# Patient Record
Sex: Male | Born: 1935
Health system: Southern US, Community
[De-identification: ages and names within clinical notes are randomized; demographics above are authoritative.]

## PROBLEM LIST (undated history)

## (undated) DIAGNOSIS — G629 Polyneuropathy, unspecified: Secondary | ICD-10-CM

## (undated) DIAGNOSIS — I10 Essential (primary) hypertension: Secondary | ICD-10-CM

## (undated) DIAGNOSIS — C801 Malignant (primary) neoplasm, unspecified: Secondary | ICD-10-CM

## (undated) DIAGNOSIS — G709 Myoneural disorder, unspecified: Secondary | ICD-10-CM

## (undated) DIAGNOSIS — F329 Major depressive disorder, single episode, unspecified: Secondary | ICD-10-CM

## (undated) DIAGNOSIS — R413 Other amnesia: Secondary | ICD-10-CM

## (undated) DIAGNOSIS — R269 Unspecified abnormalities of gait and mobility: Secondary | ICD-10-CM

## (undated) DIAGNOSIS — F419 Anxiety disorder, unspecified: Secondary | ICD-10-CM

## (undated) DIAGNOSIS — C61 Malignant neoplasm of prostate: Secondary | ICD-10-CM

## (undated) DIAGNOSIS — H269 Unspecified cataract: Secondary | ICD-10-CM

## (undated) DIAGNOSIS — F32A Depression, unspecified: Secondary | ICD-10-CM

## (undated) DIAGNOSIS — G609 Hereditary and idiopathic neuropathy, unspecified: Secondary | ICD-10-CM

## (undated) DIAGNOSIS — R011 Cardiac murmur, unspecified: Secondary | ICD-10-CM

## (undated) HISTORY — PX: PROSTATE SURGERY: SHX751

## (undated) HISTORY — DX: Anxiety disorder, unspecified: F41.9

## (undated) HISTORY — DX: Other amnesia: R41.3

## (undated) HISTORY — DX: Unspecified abnormalities of gait and mobility: R26.9

## (undated) HISTORY — DX: Hereditary and idiopathic neuropathy, unspecified: G60.9

## (undated) HISTORY — DX: Unspecified cataract: H26.9

## (undated) HISTORY — DX: Major depressive disorder, single episode, unspecified: F32.9

## (undated) HISTORY — DX: Depression, unspecified: F32.A

## (undated) HISTORY — PX: CATARACT EXTRACTION: SUR2

## (undated) HISTORY — PX: EYE SURGERY: SHX253

## (undated) HISTORY — DX: Cardiac murmur, unspecified: R01.1

## (undated) HISTORY — DX: Myoneural disorder, unspecified: G70.9

---

## 1983-12-29 HISTORY — PX: HERNIA REPAIR: SHX51

## 1998-03-28 ENCOUNTER — Encounter: Admission: RE | Admit: 1998-03-28 | Discharge: 1998-06-26 | Payer: Self-pay | Admitting: Radiation Oncology

## 1999-09-08 ENCOUNTER — Emergency Department (HOSPITAL_COMMUNITY): Admission: EM | Admit: 1999-09-08 | Discharge: 1999-09-08 | Payer: Self-pay | Admitting: Emergency Medicine

## 2002-01-24 ENCOUNTER — Encounter: Payer: Self-pay | Admitting: Urology

## 2002-01-27 ENCOUNTER — Ambulatory Visit (HOSPITAL_COMMUNITY): Admission: RE | Admit: 2002-01-27 | Discharge: 2002-01-27 | Payer: Self-pay | Admitting: Urology

## 2002-02-04 ENCOUNTER — Ambulatory Visit (HOSPITAL_COMMUNITY): Admission: RE | Admit: 2002-02-04 | Discharge: 2002-02-04 | Payer: Self-pay | Admitting: Internal Medicine

## 2002-02-04 ENCOUNTER — Encounter: Payer: Self-pay | Admitting: Internal Medicine

## 2002-10-31 ENCOUNTER — Encounter: Admission: RE | Admit: 2002-10-31 | Discharge: 2002-10-31 | Payer: Self-pay | Admitting: Urology

## 2002-10-31 ENCOUNTER — Encounter: Payer: Self-pay | Admitting: Urology

## 2005-09-26 ENCOUNTER — Encounter: Admission: RE | Admit: 2005-09-26 | Discharge: 2005-09-26 | Payer: Self-pay | Admitting: Emergency Medicine

## 2006-07-11 ENCOUNTER — Emergency Department (HOSPITAL_COMMUNITY): Admission: EM | Admit: 2006-07-11 | Discharge: 2006-07-11 | Payer: Self-pay | Admitting: Emergency Medicine

## 2006-07-16 ENCOUNTER — Emergency Department (HOSPITAL_COMMUNITY): Admission: EM | Admit: 2006-07-16 | Discharge: 2006-07-16 | Payer: Self-pay | Admitting: Family Medicine

## 2008-05-29 ENCOUNTER — Ambulatory Visit (HOSPITAL_COMMUNITY): Admission: RE | Admit: 2008-05-29 | Discharge: 2008-05-29 | Payer: Self-pay | Admitting: Urology

## 2009-01-17 ENCOUNTER — Encounter: Admission: RE | Admit: 2009-01-17 | Discharge: 2009-01-17 | Payer: Self-pay | Admitting: Emergency Medicine

## 2011-05-15 NOTE — Op Note (Signed)
[facility]  Patient:    BRASEN, BUNDREN Visit Number: [id number] MRN: [id number]          Service Type: DSU Location: DAY Attending Physician:  Ellwood Handler Dictated by:   Verl Dicker, M.D. Proc. Date: 01/27/02 Admit Date:  01/27/2002   CC:         Reuben Likes, M.D.  Wynn Banker, M.D.   Operative Report  DATE OF BIRTH:  [birth year]  PREOPERATIVE DIAGNOSIS:  Urethral stricture after seed implant March of 1999, unable to dilate stricture in the office.  Patient brought to the operating room.  POSTOPERATIVE DIAGNOSIS:  Urethral stricture after seed implant March of 1999, unable to dilate stricture in the office.  Patient brought to the operating room.  OPERATION PERFORMED:  DVIU.  SURGEON:  Verl Dicker, M.D.  RADIATION ONCOLOGIST:  Wynn Banker, M.D.  LMD:  Reuben Likes, M.D.  ANESTHESIA:  General.  DRAINS:  20 French [place] tip catheter.  DESCRIPTION OF PROCEDURE:  The patient was prepped and draped in the dorsal lithotomy position after institution of an adequate level of general anesthesia.  A well-lubricated 17 French panendoscope was gently inserted at the urethral meatus.  Normal penile and bulbous urethra, tight stricture noted in the proximal bulbous urethra.  Guide wire was passed through the center of the strictured area, gently incised at the 12 oclock DVIU knife allowed easily passage of the 20 French urethrotome into the bladder.  There was no evidence of bladder neck contracture.  The prostate had shrunken considerably after seed implant in March 1999.  Bladder showed a large capacity with trabeculation.  No obvious abnormalities within the bladder.  Guide wire was left in place.  Ureteral dilators were used to additionally dilate the strictured area.  Guide wire was then left in place and a 20 [place] Council tip catheter was passed over the guide wire with immediate  return of several hundred ccs of clear urine.  Council tip catheter was left to straight drain and patient was returned to recovery in satisfactory condition. Dictated by:   Verl Dicker, M.D. Attending Physician:  Ellwood Handler DD:  01/27/02 TD:  01/27/02 Job: [id number]

## 2012-02-11 DIAGNOSIS — C61 Malignant neoplasm of prostate: Secondary | ICD-10-CM | POA: Diagnosis not present

## 2012-02-11 DIAGNOSIS — R82998 Other abnormal findings in urine: Secondary | ICD-10-CM | POA: Diagnosis not present

## 2012-02-29 DIAGNOSIS — Z961 Presence of intraocular lens: Secondary | ICD-10-CM | POA: Diagnosis not present

## 2012-04-06 DIAGNOSIS — C61 Malignant neoplasm of prostate: Secondary | ICD-10-CM | POA: Diagnosis not present

## 2012-04-13 DIAGNOSIS — N390 Urinary tract infection, site not specified: Secondary | ICD-10-CM | POA: Diagnosis not present

## 2012-04-13 DIAGNOSIS — C61 Malignant neoplasm of prostate: Secondary | ICD-10-CM | POA: Diagnosis not present

## 2012-04-13 DIAGNOSIS — R350 Frequency of micturition: Secondary | ICD-10-CM | POA: Diagnosis not present

## 2012-04-13 DIAGNOSIS — N529 Male erectile dysfunction, unspecified: Secondary | ICD-10-CM | POA: Diagnosis not present

## 2012-04-28 DIAGNOSIS — R350 Frequency of micturition: Secondary | ICD-10-CM | POA: Diagnosis not present

## 2012-04-28 DIAGNOSIS — C61 Malignant neoplasm of prostate: Secondary | ICD-10-CM | POA: Diagnosis not present

## 2012-05-10 DIAGNOSIS — D126 Benign neoplasm of colon, unspecified: Secondary | ICD-10-CM | POA: Diagnosis not present

## 2012-05-10 DIAGNOSIS — K5730 Diverticulosis of large intestine without perforation or abscess without bleeding: Secondary | ICD-10-CM | POA: Diagnosis not present

## 2012-05-10 DIAGNOSIS — K648 Other hemorrhoids: Secondary | ICD-10-CM | POA: Diagnosis not present

## 2012-05-10 DIAGNOSIS — Z86010 Personal history of colonic polyps: Secondary | ICD-10-CM | POA: Diagnosis not present

## 2012-05-10 DIAGNOSIS — Z09 Encounter for follow-up examination after completed treatment for conditions other than malignant neoplasm: Secondary | ICD-10-CM | POA: Diagnosis not present

## 2012-05-19 DIAGNOSIS — N281 Cyst of kidney, acquired: Secondary | ICD-10-CM | POA: Diagnosis not present

## 2012-05-19 DIAGNOSIS — C61 Malignant neoplasm of prostate: Secondary | ICD-10-CM | POA: Diagnosis not present

## 2012-05-19 DIAGNOSIS — K7689 Other specified diseases of liver: Secondary | ICD-10-CM | POA: Diagnosis not present

## 2012-05-19 DIAGNOSIS — R310 Gross hematuria: Secondary | ICD-10-CM | POA: Diagnosis not present

## 2012-05-19 DIAGNOSIS — N3289 Other specified disorders of bladder: Secondary | ICD-10-CM | POA: Diagnosis not present

## 2012-05-24 DIAGNOSIS — R82998 Other abnormal findings in urine: Secondary | ICD-10-CM | POA: Diagnosis not present

## 2012-05-24 DIAGNOSIS — C61 Malignant neoplasm of prostate: Secondary | ICD-10-CM | POA: Diagnosis not present

## 2012-05-24 DIAGNOSIS — R310 Gross hematuria: Secondary | ICD-10-CM | POA: Diagnosis not present

## 2012-05-29 ENCOUNTER — Emergency Department (HOSPITAL_COMMUNITY)
Admission: EM | Admit: 2012-05-29 | Discharge: 2012-05-29 | Disposition: A | Discharge status: Home / Self Care | Payer: Medicare Other | Attending: Emergency Medicine | Admitting: Emergency Medicine

## 2012-05-29 ENCOUNTER — Encounter (HOSPITAL_COMMUNITY): Payer: Self-pay | Admitting: Emergency Medicine

## 2012-05-29 DIAGNOSIS — I10 Essential (primary) hypertension: Secondary | ICD-10-CM | POA: Insufficient documentation

## 2012-05-29 DIAGNOSIS — R319 Hematuria, unspecified: Secondary | ICD-10-CM | POA: Diagnosis not present

## 2012-05-29 DIAGNOSIS — R339 Retention of urine, unspecified: Secondary | ICD-10-CM

## 2012-05-29 DIAGNOSIS — Z8546 Personal history of malignant neoplasm of prostate: Secondary | ICD-10-CM | POA: Insufficient documentation

## 2012-05-29 HISTORY — DX: Malignant neoplasm of prostate: C61

## 2012-05-29 HISTORY — DX: Malignant (primary) neoplasm, unspecified: C80.1

## 2012-05-29 HISTORY — DX: Essential (primary) hypertension: I10

## 2012-05-29 HISTORY — DX: Polyneuropathy, unspecified: G62.9

## 2012-05-29 LAB — POCT I-STAT, CHEM 8
BUN: 19 mg/dL (ref 6–23)
Chloride: 104 mEq/L (ref 96–112)
Glucose, Bld: 97 mg/dL (ref 70–99)
HCT: 39 % (ref 39.0–52.0)
Potassium: 3.6 mEq/L (ref 3.5–5.1)

## 2012-05-29 LAB — URINALYSIS, ROUTINE W REFLEX MICROSCOPIC
Glucose, UA: NEGATIVE mg/dL
Protein, ur: 100 mg/dL — AB
Specific Gravity, Urine: 1.016 (ref 1.005–1.030)
pH: 6.5 (ref 5.0–8.0)

## 2012-05-29 LAB — URINE MICROSCOPIC-ADD ON

## 2012-05-29 MED ORDER — CEPHALEXIN 500 MG PO CAPS: 500.0000 mg | ORAL_CAPSULE | Freq: Three times a day (TID) | ORAL | Status: AC

## 2012-05-29 NOTE — ED Notes (Signed)
Gave patient a sandwich, drink, and some crackers and peanut.

## 2012-05-29 NOTE — ED Notes (Signed)
Urinary discomfort relieved with foley-foley draining blood tinged urine

## 2012-05-29 NOTE — ED Provider Notes (Signed)
History     CSN: [id number]  Arrival date & time 05/29/12  1227   First MD Initiated Contact with Patient 05/29/12 1301      Chief Complaint  Patient presents with  . Dysuria    (Consider location/radiation/quality/duration/timing/severity/associated sxs/prior treatment) HPI Comments: Patient with history of prostate cancer presents with urinary retention since last night at approximately 10 PM. Patient has had some dribbling since. He also reports hematuria for the past day. Patient denies blood thinning medications. He has not had any dysuria. He denies fever, nausea, vomiting. He has suprapubic abdominal discomfort associated with urinary retention. No other medical complaints voiced. Onset was gradual. Course was constant. The patient abdomen makes the symptoms worse. Nothing makes symptoms better.  The history is provided by the patient.    Past Medical History  Diagnosis Date  . Cancer   . Hypertension   . Prostate cancer   . Neuropathy     Past Surgical History  Procedure Date  . Hernia repair   . Prostate surgery     No family history on file.  History  Substance Use Topics  . Smoking status: Former Games developer  . Smokeless tobacco: Not on file  . Alcohol Use: No      Review of Systems  Constitutional: Negative for fever.  HENT: Negative for sore throat and rhinorrhea.   Eyes: Negative for redness.  Respiratory: Negative for cough.   Cardiovascular: Negative for chest pain.  Gastrointestinal: Positive for abdominal pain. Negative for nausea, vomiting and diarrhea.  Genitourinary: Positive for hematuria, decreased urine volume and difficulty urinating. Negative for dysuria, flank pain, discharge and penile pain.  Musculoskeletal: Negative for myalgias.  Skin: Negative for rash.  Neurological: Negative for headaches.    Allergies  Review of patient's allergies indicates no known allergies.  Home Medications  No current outpatient prescriptions on  file.  BP 158/88  Pulse 68  Temp(Src) 98.3 F (36.8 C) (Oral)  Resp 16  SpO2 97%  Physical Exam  Nursing note and vitals reviewed. Constitutional: He appears well-developed and well-nourished.  HENT:  Head: Normocephalic and atraumatic.  Eyes: Conjunctivae are normal. Right eye exhibits no discharge. Left eye exhibits no discharge.  Neck: Normal range of motion. Neck supple.  Cardiovascular: Normal rate, regular rhythm and normal heart sounds.   Pulmonary/Chest: Effort normal and breath sounds normal.  Abdominal: Soft. There is tenderness in the suprapubic area. There is no rigidity, no rebound, no guarding, no tenderness at McBurney's point and negative Murphy's sign.  Genitourinary: Testes normal. Uncircumcised. No penile erythema or penile tenderness. Discharge (dried blood noted at urethral meatus) found.  Neurological: He is alert.  Skin: Skin is warm and dry.  Psychiatric: He has a normal mood and affect.    ED Course  Procedures (including critical care time)  Labs Reviewed  URINALYSIS, ROUTINE W REFLEX MICROSCOPIC - Abnormal; Notable for the following:    Color, Urine RED (*) BIOCHEMICALS MAY BE AFFECTED BY COLOR   APPearance CLOUDY (*)    Hgb urine dipstick LARGE (*)    Ketones, ur TRACE (*)    Protein, ur 100 (*)    Leukocytes, UA MODERATE (*)    All other components within normal limits  URINE MICROSCOPIC-ADD ON - Abnormal; Notable for the following:    Bacteria, UA MANY (*)    All other components within normal limits  POCT I-STAT, CHEM 8  URINE CULTURE   No results found.   1. Urinary retention  1:38 PM Patient seen and examined. Work-up initiated. Order for foley cath.   Vital signs reviewed and are as follows: Filed Vitals:   05/29/12 1247  BP: 158/88  Pulse: 68  Temp: 98.3 F (36.8 C)  Resp: 16   With the patient is much improved after Foley catheter placement. I-STAT demonstrates a normal hemoglobin and renal function. Patient seen and  examined with Dr. Radford Pax. Will leave Foley catheter in place and have patient followup with his urologist this week. Will add Keflex to treat any possible urinary tract infection. Urine culture was sent. Patient appears well at time of discharge. The patient is comfortable with the plan. He was given strict return instructions including worsening pain, severe amount of bleeding, lightheadedness, or if he has any other concerns.    MDM  Urinary retention, Foley placed. Patient improved after Foley placement. I-STAT is unconcerning. Patient has appropriate urology followup and will see his urologist this coming week. Patient appears well and is stable at time of discharge.        Renne Crigler, [place] 05/29/12 [phone]

## 2012-05-29 NOTE — ED Notes (Signed)
Took catheter bag and put leg bag on.

## 2012-05-29 NOTE — ED Notes (Signed)
Pt presenting to ed with c/o dysuria. Pt states he lasted urinated last night and has just been dripping urine this am. Pt states he has urgency. Pt states history of prostate cancer and he has had to self-cath in the past. Pt is alert and oriented at this time.

## 2012-05-29 NOTE — ED Notes (Signed)
Family at bedside. 

## 2012-05-29 NOTE — Discharge Instructions (Signed)
Please read and follow all provided instructions.  Your diagnoses today include:  1. Urinary retention     Tests performed today include:  Urine test  Blood test that showed normal blood count and kidney function  Vital signs. See below for your results today.   Medications prescribed:   Keflex - antibiotic for urinary tract infection  You have been prescribed an antibiotic medicine: take the entire course of medicine even if you are feeling better. Stopping early can cause the antibiotic not to work.  Take any prescribed medications only as directed.  Home care instructions:  Follow any educational materials contained in this packet.  Follow-up instructions: Please follow-up with your urologist in the next 3 days for further evaluation of your symptoms. If you do not have a primary care doctor -- see below for referral information.   Return instructions:   Please return to the Emergency Department if you experience worsening symptoms.   Please return if you have any other emergent concerns.  Additional Information:  Your vital signs today were: BP 126/67  Pulse 61  Temp(Src) 98.4 F (36.9 C) (Oral)  Resp 16  SpO2 98% If your blood pressure (BP) was elevated above 135/85 this visit, please have this repeated by your doctor within one month. -------------- No Primary Care Doctor Call [facility]  [phone] Other agencies that provide inexpensive medical care    [facility]  [phone]    [facility]  [phone]    [facility]  [phone]    [facility]  [phone]    [facility]  [phone]    [facility]  [phone] -------------- RESOURCE GUIDE:  Dental Problems  Patients with Medicaid: [facility] [phone] [address]                                            [id number] Phone:  [phone]                                                    Phone:  [phone]  If unable to pay or uninsured, contact:  [facility] or [facility]. to become qualified for the adult dental clinic.  Chronic Pain Problems Contact [facility]  [phone] Patients need to be referred by their primary care doctor.  Insufficient Money for Medicine Contact [employer]:  call "211" or [facility] [phone].  Psychological Services [facility]  [phone] [facility]  [phone] [facility]   [phone] (emergency services [phone])  Substance Abuse Resources [facility]  [phone] [facility] 6787561147 [facility] [phone] Floydene Flock 7261947115 Residential & Outpatient Substance Abuse Program  [phone]  Abuse/Neglect [facility] Child Abuse Hotline (443) 652-4311 [facility] Child Abuse Hotline [phone] (After Hours)  Emergency Shelter [facility] [phone]  Maternity Homes [facility] [phone] [facility] [phone]  [facility]  [facility] [address] [place]                       [address]      [address]  [place]                                                [place]                            [place] Phone:  [phone]                                   Phone:  [phone]                 Phone:  [phone]  [facility] Phone:  [phone]  [place] Child Abuse Hotline (336) 342-1394 [phone]) [phone] (After Hours)    

## 2012-05-30 ENCOUNTER — Encounter (HOSPITAL_COMMUNITY): Payer: Self-pay | Admitting: Emergency Medicine

## 2012-05-30 ENCOUNTER — Emergency Department (HOSPITAL_COMMUNITY)
Admission: EM | Admit: 2012-05-30 | Discharge: 2012-05-30 | Disposition: A | Discharge status: Home / Self Care | Payer: Medicare Other | Attending: Emergency Medicine | Admitting: Emergency Medicine

## 2012-05-30 DIAGNOSIS — N390 Urinary tract infection, site not specified: Secondary | ICD-10-CM | POA: Insufficient documentation

## 2012-05-30 DIAGNOSIS — R338 Other retention of urine: Secondary | ICD-10-CM | POA: Insufficient documentation

## 2012-05-30 DIAGNOSIS — T83091A Other mechanical complication of indwelling urethral catheter, initial encounter: Secondary | ICD-10-CM | POA: Diagnosis not present

## 2012-05-30 DIAGNOSIS — R319 Hematuria, unspecified: Secondary | ICD-10-CM | POA: Diagnosis not present

## 2012-05-30 DIAGNOSIS — R339 Retention of urine, unspecified: Secondary | ICD-10-CM

## 2012-05-30 DIAGNOSIS — Z8546 Personal history of malignant neoplasm of prostate: Secondary | ICD-10-CM | POA: Insufficient documentation

## 2012-05-30 DIAGNOSIS — Y846 Urinary catheterization as the cause of abnormal reaction of the patient, or of later complication, without mention of misadventure at the time of the procedure: Secondary | ICD-10-CM | POA: Insufficient documentation

## 2012-05-30 NOTE — Discharge Instructions (Signed)
Complete the course of your antibiotic.  Your urine culture is not yet resulted - have your doctor follow up on that result tomorrow. Follow up with urologist tomorrow - call today to arrange appointment time. Return to ER if worse, catheter not draining, abdominal pain, fevers, other concern.      Foley Catheter Care, Adult A soft, flexible tube (Foley catheter) has been placed in your bladder. This may be done to temporarily help with urine drainage after an operation or to relieve blockage from an enlarged prostate gland. HOME CARE INSTRUCTIONS  If you are going home with a Foley catheter in place, follow these instructions: Taking Care of the Catheter:  Keep the area where the catheter leaves your body clean.   Attach the catheter to the leg so there is no tension on the catheter.   Keep the drainage bag below the level of the bladder, but keep it OFF the floor.   Do not take long soaking baths. Your caregiver will give instructions about showering.   Wash your hands before touching ANYTHING related to the catheter or bag.   Using mild soap and warm water on a washcloth:   Clean the area closest to the catheter insertion site using a circular motion around the catheter.   Clean the catheter itself by wiping AWAY from the insertion site for several inches down the tube.   NEVER wipe upward as this could sweep bacteria up into the urethra (tube in your body that normally drains the bladder) and cause infection.  Taking Care of the Drainage Bags:  Two drainage bags will be taken home: a large overnight drainage bag, and a smaller leg bag which fits underneath clothing.   It is okay to wear the overnight bag at any time, but NEVER wear the smaller leg bag at night.   Keep the drainage bag well below the level of your bladder. This prevents backflow of urine into the bladder and allows the urine to drain freely.   Anchor the tubing to your leg to prevent pulling or tension on  the catheter. Use tape or a leg strap provided by the hospital.   Empty the drainage bag when it is  to  full. Wash your hands before and after touching the bag.   Periodically check the tubing for kinks to make sure there is no pressure on the tubing which could restrict the flow of urine.  Changing the Drainage Bags:  Cleanse both ends of the clean bag with alcohol before changing.   Pinch off the rubber catheter to avoid urine spillage during the disconnection.   Disconnect the dirty bag and connect the clean one.   Empty the dirty bag carefully to avoid a urine spill.   Attach the new bag to the leg with tape or a leg strap.  Cleaning the Drainage Bags:  Whenever a drainage bag is disconnected, it must be cleaned quickly so it is ready for the next use.   Wash the bag in warm, soapy water.   Rinse the bag thoroughly with warm water.   Soak the bag for 30 minutes in a solution of white vinegar and water (1 cup vinegar to 1 quart warm water).   Rinse with warm water.  SEEK MEDICAL CARE IF:   Some pain develops in the kidney (lower back) area.   The urine is cloudy or smells bad.   There is some blood in the urine.   The catheter becomes clogged and/or there  is no urine drainage.  SEEK IMMEDIATE MEDICAL CARE IF:   You have moderate or severe pain in the kidney region.   You start to throw up (vomit).   Blood fills the tube.   Worsening belly (abdominal) pain develops.   You have a fever.  MAKE SURE YOU:   Understand these instructions.   Will watch your condition.   Will get help right away if you are not doing well or get worse.  Document Released: 12/14/2005 Document Revised: 12/03/2011 Document Reviewed: 06/10/2007 [facility] Patient Information 2012 [place], [place].     Hematuria, Adult Hematuria (blood in your urine) can be caused by a bladder infection (cystitis), kidney infection (pyelonephritis), prostate infection (prostatitis), or kidney stone.  Infections will usually respond to antibiotics (medications which kill germs), and a kidney stone will usually pass through your urine without further treatment. If you were put on antibiotics, take all the medicine until gone. You may feel better in a few days, but take all of your medicine or the infection may not respond and become more difficult to treat. If antibiotics were not given, an infection did not cause the blood in the urine. A further work up to find out the reason may be needed. HOME CARE INSTRUCTIONS   Drink lots of fluid, 3 to 4 quarts a day. If you have been diagnosed with an infection, cranberry juice is especially recommended, in addition to large amounts of water.   Avoid caffeine, tea, and carbonated beverages, because they tend to irritate the bladder.   Avoid alcohol as it may irritate the prostate.   Only take over-the-counter or prescription medicines for pain, discomfort, or fever as directed by your caregiver.   If you have been diagnosed with a kidney stone follow your caregivers instructions regarding straining your urine to catch the stone.  TO PREVENT FURTHER INFECTIONS:  Empty the bladder often. Avoid holding urine for long periods of time.   After a bowel movement, women should cleanse front to back. Use each tissue only once.   Empty the bladder before and after sexual intercourse if you are a male.   Return to your caregiver if you develop back pain, fever, nausea (feeling sick to your stomach), vomiting, or your symptoms (problems) are not better in 3 days. Return sooner if you are getting worse.  If you have been requested to return for further testing make sure to keep your appointments. If an infection is not the cause of blood in your urine, X-rays may be required. Your caregiver will discuss this with you. SEEK IMMEDIATE MEDICAL CARE IF:   You have a persistent fever over 102 F (38.9 C).   You develop severe vomiting and are unable to keep the  medication down.   You develop severe back or abdominal pain despite taking your medications.   You begin passing a large amount of blood or clots in your urine.   You feel extremely weak or faint, or pass out.  MAKE SURE YOU:   Understand these instructions.   Will watch your condition.   Will get help right away if you are not doing well or get worse.  Document Released: 12/14/2005 Document Revised: 12/03/2011 Document Reviewed: 08/02/2008 [facility] Patient Information 2012 [place], [place].      Acute Urinary Retention, Male You have been seen by a caregiver today because of your inability to urinate (pass your water). This is a common problem in elderly males. As men age their prostates become larger and  block the flow of urine from the bladder. This is usually a problem that has come on gradually. It is often first noticed by having to get up at night to urinate. This is because as the prostate enlarges it is more difficult to empty the bladder completely. Treatment may involve a one time catheterization to empty the bladder. This is putting in a tube to drain your urine. Then you and your personal caregiver can decide at your earliest convenience how to handle this problem in the future. It may also be a problem that may not recur for years. Sometimes this problem can be caused by medications. In this case, all that is often necessary is to discontinue the offending agent. If you are to leave the foley catheter (a long, narrow, hollow tube) in and go home with a drainage system, you will need to discuss the best course of action with your caregiver. While the catheter is in, maintain a good intake of fluids. Keep the drainage bag emptied and lower than your catheter. This is so contaminated (infected) urine will not be flowing back into your bladder. This could lead to a urinary tract infection. Only take over-the-counter or prescription medicines for pain, discomfort, or fever as  directed by your caregiver.  SEEK IMMEDIATE MEDICAL CARE IF:  You develop chills, fever, or show signs of generalized illness that occurs prior to seeing your caregiver. Document Released: 03/22/2001 Document Revised: 12/03/2011 Document Reviewed: 12/05/2008 [facility] Patient Information 2012 [place], [place].

## 2012-05-30 NOTE — ED Notes (Signed)
Patient belongings by the bedside.

## 2012-05-30 NOTE — ED Provider Notes (Signed)
History     CSN: [id number]  Arrival date & time 05/30/12  4098   First MD Initiated Contact with Patient 05/30/12 [phone]     Chief complaint: foley catheter not working   (Consider location/radiation/quality/duration/timing/severity/associated sxs/prior treatment) The history is provided by the patient.  pt had foley catheter placed yesterday due to urine retention/uti/hematuria. This am foley clogged, urine draining around catheter. No abd pain. No nv. No fever or chills. In on abx, urine culture not yet resulted. No coumadin/anticoag use.      Past Medical History  Diagnosis Date  . Cancer   . Hypertension   . Prostate cancer   . Neuropathy     Past Surgical History  Procedure Date  . Hernia repair   . Prostate surgery     No family history on file.  History  Substance Use Topics  . Smoking status: Former Games developer  . Smokeless tobacco: Not on file  . Alcohol Use: No      Review of Systems  Constitutional: Negative for fever and chills.  Respiratory: Negative for shortness of breath.   Gastrointestinal: Negative for vomiting and abdominal pain.  Neurological: Negative for light-headedness.    Allergies  Review of patient's allergies indicates no known allergies.  Home Medications   Current Outpatient Rx  Name Route Sig Dispense Refill  . ASPIRIN EC 81 MG PO TBEC Oral Take 81 mg by mouth daily.    Marland Kitchen BICALUTAMIDE 50 MG PO TABS Oral Take 50 mg by mouth daily.    Marland Kitchen CALCIUM + D PO Oral Take 1 tablet by mouth 2 (two) times daily.    . CEPHALEXIN 500 MG PO CAPS Oral Take 1 capsule (500 mg total) by mouth 3 (three) times daily. 21 capsule 0  . CIPROFLOXACIN HCL 250 MG PO TABS Oral Take 250 mg by mouth 2 (two) times daily.    . OMEGA-3 FATTY ACIDS 1000 MG PO CAPS Oral Take 1 g by mouth 2 (two) times daily.    Marland Kitchen GABAPENTIN 300 MG PO CAPS Oral Take 300 mg by mouth 4 (four) times daily.    Marland Kitchen HYDROCHLOROTHIAZIDE 25 MG PO TABS Oral Take 25 mg by mouth daily.    Marland Kitchen  SIMVASTATIN 40 MG PO TABS Oral Take 40 mg by mouth at bedtime.      BP 106/76  Pulse 82  Temp(Src) 98.3 F (36.8 C) (Oral)  Resp 14  SpO2 97%  Physical Exam  Nursing note and vitals reviewed. Constitutional: He is oriented to person, place, and time. He appears well-developed and well-nourished. No distress.  HENT:  Head: Atraumatic.  Eyes: Pupils are equal, round, and reactive to light.  Neck: Neck supple. No tracheal deviation present.  Cardiovascular: Normal rate.   Pulmonary/Chest: Effort normal. No accessory muscle usage. No respiratory distress.  Abdominal: Soft. Bowel sounds are normal. He exhibits no distension. There is no tenderness.  Genitourinary:       Normal ext genitalia. Foley in place. Small amt blood tinged urine in bag.   Musculoskeletal: Normal range of motion.  Neurological: He is alert and oriented to person, place, and time.  Skin: Skin is warm and dry.  Psychiatric: He has a normal mood and affect.    ED Course  Procedures (including critical care time)     MDM   Foley irrigated. Pt states sees Dr Mena Goes - will have follow up there.    Pt on abx, u cx not yet back. After irrigation catheter draining well  into bag. abd soft nt. No pain.     Suzi Roots, MD 05/30/12 1034

## 2012-05-30 NOTE — ED Notes (Signed)
Pt co of blood clots and urinating around the foley that was placed yesterday here, for urinary retention.

## 2012-05-31 DIAGNOSIS — C61 Malignant neoplasm of prostate: Secondary | ICD-10-CM | POA: Diagnosis not present

## 2012-05-31 DIAGNOSIS — R310 Gross hematuria: Secondary | ICD-10-CM | POA: Diagnosis not present

## 2012-05-31 LAB — URINE CULTURE
Colony Count: NO GROWTH
Culture  Setup Time: 201306021927
Culture: NO GROWTH
Special Requests: NORMAL

## 2012-05-31 NOTE — ED Provider Notes (Signed)
Medical screening examination/treatment/procedure(s) were performed by non-physician practitioner and as supervising physician I was immediately available for consultation/collaboration.    Jaben Benegas L Dezirae Service, MD 05/31/12 0822 

## 2012-06-14 DIAGNOSIS — G609 Hereditary and idiopathic neuropathy, unspecified: Secondary | ICD-10-CM | POA: Diagnosis not present

## 2012-06-14 DIAGNOSIS — C61 Malignant neoplasm of prostate: Secondary | ICD-10-CM | POA: Diagnosis not present

## 2012-06-14 DIAGNOSIS — I10 Essential (primary) hypertension: Secondary | ICD-10-CM | POA: Diagnosis not present

## 2012-06-14 DIAGNOSIS — E785 Hyperlipidemia, unspecified: Secondary | ICD-10-CM | POA: Diagnosis not present

## 2012-06-29 DIAGNOSIS — C61 Malignant neoplasm of prostate: Secondary | ICD-10-CM | POA: Diagnosis not present

## 2012-08-16 DIAGNOSIS — M5137 Other intervertebral disc degeneration, lumbosacral region: Secondary | ICD-10-CM | POA: Diagnosis not present

## 2012-08-16 DIAGNOSIS — M25559 Pain in unspecified hip: Secondary | ICD-10-CM | POA: Diagnosis not present

## 2012-08-16 DIAGNOSIS — M999 Biomechanical lesion, unspecified: Secondary | ICD-10-CM | POA: Diagnosis not present

## 2012-10-11 DIAGNOSIS — C61 Malignant neoplasm of prostate: Secondary | ICD-10-CM | POA: Diagnosis not present

## 2012-10-25 DIAGNOSIS — C61 Malignant neoplasm of prostate: Secondary | ICD-10-CM | POA: Diagnosis not present

## 2012-11-03 DIAGNOSIS — Z23 Encounter for immunization: Secondary | ICD-10-CM | POA: Diagnosis not present

## 2012-12-12 DIAGNOSIS — G609 Hereditary and idiopathic neuropathy, unspecified: Secondary | ICD-10-CM | POA: Diagnosis not present

## 2012-12-12 DIAGNOSIS — E785 Hyperlipidemia, unspecified: Secondary | ICD-10-CM | POA: Diagnosis not present

## 2012-12-12 DIAGNOSIS — I10 Essential (primary) hypertension: Secondary | ICD-10-CM | POA: Diagnosis not present

## 2012-12-12 DIAGNOSIS — Z79899 Other long term (current) drug therapy: Secondary | ICD-10-CM | POA: Diagnosis not present

## 2013-04-27 DIAGNOSIS — C61 Malignant neoplasm of prostate: Secondary | ICD-10-CM | POA: Diagnosis not present

## 2013-04-30 ENCOUNTER — Encounter (HOSPITAL_COMMUNITY): Payer: Self-pay | Admitting: *Deleted

## 2013-04-30 ENCOUNTER — Emergency Department (HOSPITAL_COMMUNITY)
Admission: EM | Admit: 2013-04-30 | Discharge: 2013-04-30 | Disposition: A | Discharge status: Home / Self Care | Payer: Medicare Other | Attending: Emergency Medicine | Admitting: Emergency Medicine

## 2013-04-30 DIAGNOSIS — Z8546 Personal history of malignant neoplasm of prostate: Secondary | ICD-10-CM | POA: Diagnosis not present

## 2013-04-30 DIAGNOSIS — Z87891 Personal history of nicotine dependence: Secondary | ICD-10-CM | POA: Diagnosis not present

## 2013-04-30 DIAGNOSIS — Z8589 Personal history of malignant neoplasm of other organs and systems: Secondary | ICD-10-CM | POA: Diagnosis not present

## 2013-04-30 DIAGNOSIS — G609 Hereditary and idiopathic neuropathy, unspecified: Secondary | ICD-10-CM | POA: Diagnosis not present

## 2013-04-30 DIAGNOSIS — R209 Unspecified disturbances of skin sensation: Secondary | ICD-10-CM | POA: Diagnosis not present

## 2013-04-30 DIAGNOSIS — Z79899 Other long term (current) drug therapy: Secondary | ICD-10-CM | POA: Diagnosis not present

## 2013-04-30 DIAGNOSIS — I10 Essential (primary) hypertension: Secondary | ICD-10-CM | POA: Diagnosis not present

## 2013-04-30 DIAGNOSIS — G629 Polyneuropathy, unspecified: Secondary | ICD-10-CM

## 2013-04-30 NOTE — ED Provider Notes (Signed)
History     CSN: [id number]  Arrival date & time 04/30/13  0300   First MD Initiated Contact with Patient 04/30/13 [id number]      Chief Complaint  Patient presents with  . Leg Pain    (Consider location/radiation/quality/duration/timing/severity/associated sxs/prior treatment) Patient is a 79 y.o. male presenting with leg pain.  Leg Pain  79 yo male presents to the ER with the complaint of left leg numbness.  Pt reports h/o peripheral neuropathy without known inciting cause, ongoing for years.  Pt has had workup through neurology and specialist for possible lupus, all neg aside from +EMG studies.  He was seen a few years ago when he had acute worsening of numbness in his left lower leg, reports [facility] referred him to a vascular surgeon who told him everything looked ok from his standpoint, but to take a baby aspirin a day.  He reports after some time of taking the 81 mg aspirin, symptoms resolved.    Past Medical History  Diagnosis Date  . Cancer   . Hypertension   . Prostate cancer   . Neuropathy     Past Surgical History  Procedure Laterality Date  . Hernia repair    . Prostate surgery      History reviewed. No pertinent family history.  History  Substance Use Topics  . Smoking status: Former Games developer  . Smokeless tobacco: Not on file  . Alcohol Use: No      Review of Systems  Allergies  Review of patient's allergies indicates no known allergies.  Home Medications   Current Outpatient Rx  Name  Route  Sig  Dispense  Refill  . calcium-vitamin D (OSCAL WITH D) 500-200 MG-UNIT per tablet   Oral   Take 1 tablet by mouth every morning.         . gabapentin (NEURONTIN) 300 MG capsule   Oral   Take 300 mg by mouth 3 (three) times daily.          . hydrochlorothiazide (HYDRODIURIL) 25 MG tablet   Oral   Take 25 mg by mouth every morning.         . simvastatin (ZOCOR) 40 MG tablet   Oral   Take 40 mg by mouth every evening.           BP  133/68  Pulse 61  Temp(Src) 98 F (36.7 C) (Oral)  Resp 20  SpO2 95%  Physical Exam  Nursing note and vitals reviewed. Constitutional: He is oriented to person, place, and time. He appears well-developed and well-nourished.  HENT:  Head: Normocephalic and atraumatic.  Nose: Nose normal.  Mouth/Throat: Oropharynx is clear and moist.  Eyes: Conjunctivae and EOM are normal. Pupils are equal, round, and reactive to light.  Neck: Normal range of motion. Neck supple. No JVD present. No tracheal deviation present. No thyromegaly present.  Cardiovascular: Normal rate, regular rhythm, normal heart sounds and intact distal pulses.  Exam reveals no gallop and no friction rub.   No murmur heard. Pulmonary/Chest: Effort normal and breath sounds normal. No stridor. No respiratory distress. He has no wheezes. He has no rales. He exhibits no tenderness.  Abdominal: Soft. Bowel sounds are normal. He exhibits no distension and no mass. There is no tenderness. There is no rebound and no guarding.  Musculoskeletal: Normal range of motion. He exhibits no edema and no tenderness.  Lymphadenopathy:    He has no cervical adenopathy.  Neurological: He is alert and oriented to person,  place, and time. He has normal reflexes. No cranial nerve deficit. He exhibits normal muscle tone. Coordination normal.  Slight decrease in sensation bilaterally, with left leg being hypersensitive to touch in comparison to the right  Skin: Skin is warm and dry. No rash noted. No erythema. No pallor.  Psychiatric: He has a normal mood and affect. His behavior is normal. Judgment and thought content normal.    ED Course  Procedures (including critical care time)  Labs Reviewed - No data to display No results found.   1. Peripheral neuropathy       MDM  79 yo male with slight worsening of the numbness of his left lower leg tonight.  Pt was concerned for possible stroke.  Normal neuro exam, normal vascular exam.  Pt  reassured, instructed to restart aspirin therapy and close f/u with pcm.        Olivia Mackie, MD 04/30/13 [phone]

## 2013-04-30 NOTE — ED Notes (Signed)
Pt states his left lower leg is "bothering" him,   He has pain and numbness , he has history of neuropathy

## 2013-04-30 NOTE — Discharge Instructions (Signed)
You do not show any signs of having a stroke.  Please continue taking the 81 mg aspirin a day.  Follow up with your doctor for recheck in 2-3 day.  Return to the ER for worsening condition or new concerning symptoms.   Peripheral Neuropathy Peripheral neuropathy is a common disorder of your nerves resulting from damage. CAUSES  This disorder may be caused by a disease of the nerves or illness. Many neuropathies have well known causes such as:  Diabetes. This is one of the most common causes.   Uremia.   AIDS.   Nutritional deficiencies.   Other causes include mechanical pressures. These may be from:   Compression.   Injury.   Contusions or bruises.   Fracture or dislocated bones.   Pressure involving the nerves close to the surface. Nerves such as the ulnar, or radial can be injured by prolonged use of crutches.  Other injuries may come from:  Tumor.   Hemorrhage or bleeding into a nerve.   Exposure to cold or radiation.   Certain medicines or toxic substances (rare).   Vascular or collagen disorders such as:   Atherosclerosis.   Systemic lupus erythematosus.   Scleroderma.   Sarcoidosis.   Rheumatoid arthritis.   Polyarteritis nodosa.   A large number of cases are of unknown cause.  SYMPTOMS  Common problems include:  Weakness.   Numbness.   Abnormal sensations (paresthesia) such as:   Burning.   Tickling.   Pricking.   Tingling.   Pain in the arms, hands, legs and/or feet.  TREATMENT  Therapy for this disorder differs depending on the cause. It may vary from medical treatment with medications or physical therapy among others.   For example, therapy for this disorder caused by diabetes involves control of the diabetes.   In cases where a tumor or ruptured disc is the cause, therapy may involve surgery. This would be to remove the tumor or to repair the ruptured disc.   In entrapment or compression neuropathy, treatment may consist of  splinting or surgical decompression of the ulnar or median nerves. A common example of entrapment neuropathy is carpal tunnel syndrome. This has become more common because of the increasing use of computers.   Peroneal and radial compression neuropathies may require avoidance of pressure.   Physical therapy and/or splints may be useful in preventing contractures. This is a condition in which shortened muscles around joints cause abnormal and sometimes painful positioning of the joints.  Document Released: 12/04/2002 Document Revised: 08/26/2011 Document Reviewed: 12/14/2005 [facility] Patient Information 2012 [place], [place].

## 2013-05-08 DIAGNOSIS — C61 Malignant neoplasm of prostate: Secondary | ICD-10-CM | POA: Diagnosis not present

## 2013-05-16 DIAGNOSIS — I10 Essential (primary) hypertension: Secondary | ICD-10-CM | POA: Diagnosis not present

## 2013-05-16 DIAGNOSIS — E785 Hyperlipidemia, unspecified: Secondary | ICD-10-CM | POA: Diagnosis not present

## 2013-05-16 DIAGNOSIS — IMO0002 Reserved for concepts with insufficient information to code with codable children: Secondary | ICD-10-CM | POA: Diagnosis not present

## 2013-05-16 DIAGNOSIS — G609 Hereditary and idiopathic neuropathy, unspecified: Secondary | ICD-10-CM | POA: Diagnosis not present

## 2013-05-16 DIAGNOSIS — C61 Malignant neoplasm of prostate: Secondary | ICD-10-CM | POA: Diagnosis not present

## 2013-05-16 DIAGNOSIS — Z79899 Other long term (current) drug therapy: Secondary | ICD-10-CM | POA: Diagnosis not present

## 2013-05-29 DIAGNOSIS — Z961 Presence of intraocular lens: Secondary | ICD-10-CM | POA: Diagnosis not present

## 2013-06-12 DIAGNOSIS — G609 Hereditary and idiopathic neuropathy, unspecified: Secondary | ICD-10-CM | POA: Diagnosis not present

## 2013-06-12 DIAGNOSIS — C61 Malignant neoplasm of prostate: Secondary | ICD-10-CM | POA: Diagnosis not present

## 2013-06-12 DIAGNOSIS — D126 Benign neoplasm of colon, unspecified: Secondary | ICD-10-CM | POA: Diagnosis not present

## 2013-06-12 DIAGNOSIS — E785 Hyperlipidemia, unspecified: Secondary | ICD-10-CM | POA: Diagnosis not present

## 2013-06-12 DIAGNOSIS — Z79899 Other long term (current) drug therapy: Secondary | ICD-10-CM | POA: Diagnosis not present

## 2013-06-12 DIAGNOSIS — I10 Essential (primary) hypertension: Secondary | ICD-10-CM | POA: Diagnosis not present

## 2013-06-12 DIAGNOSIS — K589 Irritable bowel syndrome without diarrhea: Secondary | ICD-10-CM | POA: Diagnosis not present

## 2013-06-12 DIAGNOSIS — Z1331 Encounter for screening for depression: Secondary | ICD-10-CM | POA: Diagnosis not present

## 2013-10-01 ENCOUNTER — Ambulatory Visit (INDEPENDENT_AMBULATORY_CARE_PROVIDER_SITE_OTHER): Payer: Medicare Other | Admitting: Internal Medicine

## 2013-10-01 VITALS — BP 126/70 | HR 74 | Temp 98.9°F | Resp 18 | Ht 74.0 in | Wt 176.0 lb

## 2013-10-01 DIAGNOSIS — R109 Unspecified abdominal pain: Secondary | ICD-10-CM

## 2013-10-01 DIAGNOSIS — R059 Cough, unspecified: Secondary | ICD-10-CM

## 2013-10-01 DIAGNOSIS — R1030 Lower abdominal pain, unspecified: Secondary | ICD-10-CM

## 2013-10-01 DIAGNOSIS — R05 Cough: Secondary | ICD-10-CM

## 2013-10-01 DIAGNOSIS — J209 Acute bronchitis, unspecified: Secondary | ICD-10-CM

## 2013-10-01 MED ORDER — HYDROCODONE-ACETAMINOPHEN 7.5-325 MG/15ML PO SOLN: 5.0000 mL | Freq: Four times a day (QID) | ORAL | Status: DC | PRN

## 2013-10-01 MED ORDER — AZITHROMYCIN 250 MG PO TABS: ORAL_TABLET | ORAL | Status: DC

## 2013-10-01 NOTE — Patient Instructions (Signed)
Bronchitis Bronchitis is the body's way of reacting to injury and/or infection (inflammation) of the bronchi. Bronchi are the air tubes that extend from the windpipe into the lungs. If the inflammation becomes severe, it may cause shortness of breath. CAUSES  Inflammation may be caused by:  A virus.  Germs (bacteria).  Dust.  Allergens.  Pollutants and many other irritants. The cells lining the bronchial tree are covered with tiny hairs (cilia). These constantly beat upward, away from the lungs, toward the mouth. This keeps the lungs free of pollutants. When these cells become too irritated and are unable to do their job, mucus begins to develop. This causes the characteristic cough of bronchitis. The cough clears the lungs when the cilia are unable to do their job. Without either of these protective mechanisms, the mucus would settle in the lungs. Then you would develop pneumonia. Smoking is a common cause of bronchitis and can contribute to pneumonia. Stopping this habit is the single most important thing you can do to help yourself. TREATMENT   Your caregiver may prescribe an antibiotic if the cough is caused by bacteria. Also, medicines that open up your airways make it easier to breathe. Your caregiver may also recommend or prescribe an expectorant. It will loosen the mucus to be coughed up. Only take over-the-counter or prescription medicines for pain, discomfort, or fever as directed by your caregiver.  Removing whatever causes the problem (smoking, for example) is critical to preventing the problem from getting worse.  Cough suppressants may be prescribed for relief of cough symptoms.  Inhaled medicines may be prescribed to help with symptoms now and to help prevent problems from returning.  For those with recurrent (chronic) bronchitis, there may be a need for steroid medicines. SEEK IMMEDIATE MEDICAL CARE IF:   During treatment, you develop more pus-like mucus (purulent  sputum).  You have a fever.  Your baby is older than 3 months with a rectal temperature of 102 F (38.9 C) or higher.  Your baby is 72 months old or younger with a rectal temperature of 100.4 F (38 C) or higher.  You become progressively more ill.  You have increased difficulty breathing, wheezing, or shortness of breath. It is necessary to seek immediate medical care if you are elderly or sick from any other disease. MAKE SURE YOU:   Understand these instructions.  Will watch your condition.  Will get help right away if you are not doing well or get worse. Document Released: 12/14/2005 Document Revised: 03/07/2012 Document Reviewed: 10/23/2008 [facility] Patient Information 2014 [place], [place]. Hernia A hernia occurs when an internal organ pushes out through a weak spot in the abdominal wall. Hernias most commonly occur in the groin and around the navel. Hernias often can be pushed back into place (reduced). Most hernias tend to get worse over time. Some abdominal hernias can get stuck in the opening (irreducible or incarcerated hernia) and cannot be reduced. An irreducible abdominal hernia which is tightly squeezed into the opening is at risk for impaired blood supply (strangulated hernia). A strangulated hernia is a medical emergency. Because of the risk for an irreducible or strangulated hernia, surgery may be recommended to repair a hernia. CAUSES   Heavy lifting.  Prolonged coughing.  Straining to have a bowel movement.  A cut (incision) made during an abdominal surgery. HOME CARE INSTRUCTIONS   Bed rest is not required. You may continue your normal activities.  Avoid lifting more than 10 pounds (4.5 kg) or straining.  Cough  gently. If you are a smoker it is best to stop. Even the best hernia repair can break down with the continual strain of coughing. Even if you do not have your hernia repaired, a cough will continue to aggravate the problem.  Do not wear anything  tight over your hernia. Do not try to keep it in with an outside bandage or truss. These can damage abdominal contents if they are trapped within the hernia sac.  Eat a normal diet.  Avoid constipation. Straining over long periods of time will increase hernia size and encourage breakdown of repairs. If you cannot do this with diet alone, stool softeners may be used. SEEK IMMEDIATE MEDICAL CARE IF:   You have a fever.  You develop increasing abdominal pain.  You feel nauseous or vomit.  Your hernia is stuck outside the abdomen, looks discolored, feels hard, or is tender.  You have any changes in your bowel habits or in the hernia that are unusual for you.  You have increased pain or swelling around the hernia.  You cannot push the hernia back in place by applying gentle pressure while lying down. MAKE SURE YOU:   Understand these instructions.  Will watch your condition.  Will get help right away if you are not doing well or get worse. Document Released: 12/14/2005 Document Revised: 03/07/2012 Document Reviewed: 08/02/2008 [facility] Patient Information 2014 [place], [place].

## 2013-10-01 NOTE — Progress Notes (Signed)
  Subjective:    Patient ID: Casey Fisher, male    DOB: [birth year], 79 y.o.   MRN: [id number]  HPI  79 YO male patient comes in today with complaints of a cough. His cough is productive with a foamy, milky white color and consistency. He states he has some post nasal drip, no sinus pressure, no ear pain or fullness.  These symptoms have been bothering him for the past 2-3 weeks. He does not live with anyone at home. He has not been around anyone that has been ill.   He also complains of groin pain on his right side. He has a hx of hernia repair many years ago. He feels an increase in pain whenever he coughs. He is unable to have restful sleep due to this cough. He has not taken any medication at home.  He has had a little ankle swelling recently. He has a hx of neuropathy.     Review of Systems     Objective:   Physical Exam  Vitals reviewed. Constitutional: He is oriented to person, place, and time. He appears well-developed and well-nourished.  HENT:  Right Ear: External ear normal.  Left Ear: External ear normal.  Nose: Mucosal edema, rhinorrhea and sinus tenderness present. Right sinus exhibits no maxillary sinus tenderness and no frontal sinus tenderness. Left sinus exhibits no maxillary sinus tenderness and no frontal sinus tenderness.  Mouth/Throat: Oropharynx is clear and moist.  Eyes: Conjunctivae and EOM are normal. Pupils are equal, round, and reactive to light.  Neck: Neck supple.  Cardiovascular: Normal rate, regular rhythm and normal heart sounds.   Pulmonary/Chest: Effort normal. No respiratory distress. He has no wheezes. He has rhonchi. He has no rales.  Abdominal: Soft. There is tenderness. A hernia is present. Hernia confirmed positive in the right inguinal area. Hernia confirmed negative in the left inguinal area.  Genitourinary: Testes normal and penis normal.     Right hernia direct  Lymphadenopathy:       Right: No inguinal adenopathy present.   Left: No inguinal adenopathy present.  Neurological: He is alert and oriented to person, place, and time. Coordination abnormal.  Skin: No erythema.  Psychiatric: He has a normal mood and affect. His behavior is normal.    2+ Edema Left  1+ Edema Right      Assessment & Plan:  Right direct hernia/refer to surgeon. Bronchitis

## 2013-10-19 ENCOUNTER — Telehealth: Payer: Self-pay

## 2013-10-19 DIAGNOSIS — K4090 Unilateral inguinal hernia, without obstruction or gangrene, not specified as recurrent: Secondary | ICD-10-CM

## 2013-10-19 NOTE — Telephone Encounter (Signed)
Made referral per Dr Perrin Maltese, called him to advise.

## 2013-10-19 NOTE — Telephone Encounter (Signed)
Patient would like referral to surgeon for hernia repair. Discussed it with Dr. Perrin Maltese during last visit but patient wanted to wait and see. Patient has decided to see surgeon.

## 2013-10-30 ENCOUNTER — Encounter (INDEPENDENT_AMBULATORY_CARE_PROVIDER_SITE_OTHER): Payer: Self-pay | Admitting: General Surgery

## 2013-10-30 ENCOUNTER — Ambulatory Visit (INDEPENDENT_AMBULATORY_CARE_PROVIDER_SITE_OTHER): Payer: Medicare Other | Admitting: General Surgery

## 2013-10-30 VITALS — BP 125/81 | HR 72 | Temp 97.1°F | Resp 14 | Ht 74.0 in | Wt 172.4 lb

## 2013-10-30 DIAGNOSIS — K4090 Unilateral inguinal hernia, without obstruction or gangrene, not specified as recurrent: Secondary | ICD-10-CM

## 2013-10-30 NOTE — Progress Notes (Signed)
Patient ID: Casey Fisher, male   DOB: 03/09/1928, 79 y.o.   MRN: [id number]  Chief Complaint  Patient presents with  . Hernia    HPI Casey Fisher is a 79 y.o. male.  We're asked to see the patient in consultation by Dr. Perrin Maltese to evaluate him for a right inguinal hernia. The patient is an 79 year old white male who has a history of a right inguinal hernia repaired in 1985. He was doing well but in April his best friend felony had a left amount of the bed. It was shortly after this that he started noticing some bulging and discomfort in his right groin. He denies any nausea or vomiting. His bowels go back and forth between constipation and lives. He states that he has had about a 20 pound weight loss since April. He received Lupron injections for his prostate cancer  HPI  Past Medical History  Diagnosis Date  . Cancer   . Hypertension   . Prostate cancer   . Neuropathy   . Depression   . Neuromuscular disorder   . Cataract     Past Surgical History  Procedure Laterality Date  . Hernia repair    . Prostate surgery    . Eye surgery      Family History  Problem Relation Age of Onset  . Brain cancer Daughter     Social History History  Substance Use Topics  . Smoking status: Former Games developer  . Smokeless tobacco: Not on file  . Alcohol Use: No    No Known Allergies  Current Outpatient Prescriptions  Medication Sig Dispense Refill  . azithromycin (ZITHROMAX) 250 MG tablet Use as directed  6 tablet  0  . calcium-vitamin D (OSCAL WITH D) 500-200 MG-UNIT per tablet Take 1 tablet by mouth every morning.      . gabapentin (NEURONTIN) 300 MG capsule Take 300 mg by mouth 3 (three) times daily.       . hydrochlorothiazide (HYDRODIURIL) 25 MG tablet Take 25 mg by mouth every morning.      Marland Kitchen HYDROcodone-acetaminophen (HYCET) 7.5-325 mg/15 ml solution Take 5 mLs by mouth every 6 (six) hours as needed for pain (or cough).  240 mL  0  . simvastatin (ZOCOR) 40 MG tablet Take 40 mg  by mouth every evening.       No current facility-administered medications for this visit.    Review of Systems Review of Systems  Constitutional: Positive for unexpected weight change.  HENT: Negative.   Eyes: Negative.   Respiratory: Negative.   Cardiovascular: Negative.   Gastrointestinal: Positive for constipation.  Endocrine: Negative.   Genitourinary: Negative.   Musculoskeletal: Negative.   Skin: Negative.   Allergic/Immunologic: Negative.   Neurological: Negative.   Hematological: Negative.   Psychiatric/Behavioral: Negative.     Blood pressure 125/81, pulse 72, temperature 97.1 F (36.2 C), temperature source Oral, resp. rate 14, height 6\' 2"  (1.88 m), weight 172 lb 6.4 oz (78.2 kg).  Physical Exam Physical Exam  Constitutional: He is oriented to person, place, and time. He appears well-developed and well-nourished.  HENT:  Head: Normocephalic and atraumatic.  Eyes: Conjunctivae and EOM are normal. Pupils are equal, round, and reactive to light.  Neck: Normal range of motion. Neck supple.  Cardiovascular: Normal rate, regular rhythm and normal heart sounds.   Pulmonary/Chest: Effort normal and breath sounds normal.  Abdominal: Soft. Bowel sounds are normal.  Genitourinary:  There is a palpable bulge in the right groin it reduces easily  Musculoskeletal: Normal range of motion.  Neurological: He is alert and oriented to person, place, and time.  Skin: Skin is warm and dry.  Psychiatric: He has a normal mood and affect. His behavior is normal.    Data Reviewed As above  Assessment    The patient has a recurrent right inguinal hernia. Because of the risk of incarceration strenuousness think he would benefit from having this fixed. He would also like to have it done. I've discussed with him in detail the risks and benefits the operation to fix the hernia as well as some of the technical aspects including the risk of injury to the vas and the testicular artery as  well as the use of mesh and he understands and wishes to proceed     Plan    Plan for right inguinal hernia repair with mesh        TOTH III,Nikeya Maxim S 10/30/2013, 2:25 PM

## 2013-11-01 DIAGNOSIS — C61 Malignant neoplasm of prostate: Secondary | ICD-10-CM | POA: Diagnosis not present

## 2013-11-08 DIAGNOSIS — C61 Malignant neoplasm of prostate: Secondary | ICD-10-CM | POA: Diagnosis not present

## 2013-11-28 ENCOUNTER — Encounter (HOSPITAL_COMMUNITY): Payer: Self-pay | Admitting: Pharmacy Technician

## 2013-11-29 ENCOUNTER — Other Ambulatory Visit (HOSPITAL_COMMUNITY): Payer: Self-pay | Admitting: *Deleted

## 2013-11-29 NOTE — Pre-Procedure Instructions (Signed)
NOE GOYER  11/29/2013   Your procedure is scheduled on:  Wednesday, December 06, 2013 at 8:30 AM.   Report to [facility] Entrance "A" at 6:30 AM.  Call this number if you have problems the morning of surgery: [phone]   Remember:   Do not eat food or drink liquids after midnight Tuesday, 12/05/13.   Take these medicines the morning of surgery with A SIP OF WATER: None  Stop  Aspirin and Vitamins as of today, 11/30/13.   Do not wear jewelry.  Do not wear lotions, powders, or cologne. You may wear deodorant.             Men may shave face and neck.  Do not bring valuables to the hospital.  [facility] is not responsible                  for any belongings or valuables.               Contacts, dentures or bridgework may not be worn into surgery.  Leave suitcase in the car. After surgery it may be brought to your room.  For patients admitted to the hospital, discharge time is determined by your                treatment team.               Patients discharged the day of surgery will not be allowed to drive  home.  Name and phone number of your driver: Family/friend   Special Instructions: Shower using CHG 2 nights before surgery and the night before surgery.  If you shower the day of surgery use CHG.  Use special wash - you have one bottle of CHG for all showers.  You should use approximately 1/3 of the bottle for each shower.   Please read over the following fact sheets that you were given: Pain Booklet, Coughing and Deep Breathing and Surgical Site Infection Prevention

## 2013-11-30 ENCOUNTER — Encounter (HOSPITAL_COMMUNITY)
Admission: RE | Admit: 2013-11-30 | Discharge: 2013-11-30 | Disposition: A | Discharge status: Home / Self Care | Payer: Medicare Other | Source: Ambulatory Visit | Attending: General Surgery | Admitting: General Surgery

## 2013-11-30 ENCOUNTER — Ambulatory Visit (HOSPITAL_COMMUNITY)
Admission: RE | Admit: 2013-11-30 | Discharge: 2013-11-30 | Disposition: A | Discharge status: Home / Self Care | Payer: Medicare Other | Source: Ambulatory Visit | Attending: General Surgery | Admitting: General Surgery

## 2013-11-30 ENCOUNTER — Encounter (HOSPITAL_COMMUNITY): Payer: Self-pay

## 2013-11-30 DIAGNOSIS — Z01812 Encounter for preprocedural laboratory examination: Secondary | ICD-10-CM | POA: Diagnosis not present

## 2013-11-30 DIAGNOSIS — Z01810 Encounter for preprocedural cardiovascular examination: Secondary | ICD-10-CM | POA: Insufficient documentation

## 2013-11-30 DIAGNOSIS — K4090 Unilateral inguinal hernia, without obstruction or gangrene, not specified as recurrent: Secondary | ICD-10-CM | POA: Insufficient documentation

## 2013-11-30 DIAGNOSIS — R918 Other nonspecific abnormal finding of lung field: Secondary | ICD-10-CM | POA: Diagnosis not present

## 2013-11-30 DIAGNOSIS — Z01818 Encounter for other preprocedural examination: Secondary | ICD-10-CM | POA: Insufficient documentation

## 2013-11-30 LAB — BASIC METABOLIC PANEL
BUN: 13 mg/dL (ref 6–23)
CO2: 29 mEq/L (ref 19–32)
Calcium: 9.6 mg/dL (ref 8.4–10.5)
Chloride: 101 mEq/L (ref 96–112)
Creatinine, Ser: 0.82 mg/dL (ref 0.50–1.35)
GFR calc Af Amer: >90 mL/min (ref >90)
Glucose, Bld: 90 mg/dL (ref 70–99)

## 2013-11-30 LAB — CBC
HCT: 40.1 % (ref 39.0–52.0)
Hemoglobin: 13.1 g/dL (ref 13.0–17.0)
MCH: 27.6 pg (ref 26.0–34.0)
MCHC: 32.7 g/dL (ref 30.0–36.0)
MCV: 84.6 fL (ref 78.0–100.0)
Platelets: 173 10*3/uL (ref 150–400)
RDW: 14.1 % (ref 11.5–15.5)

## 2013-11-30 MED ORDER — CHLORHEXIDINE GLUCONATE 4 % EX LIQD: 1.0000 "application " | Freq: Once | CUTANEOUS | Status: DC

## 2013-12-01 NOTE — Progress Notes (Signed)
Anesthesia Chart Review:  Patient is a 79 year old male scheduled for right IHR on 12/06/13 by Dr. Carolynne Edouard.  History includes former smoker, HTN, prostate cancer s/p surgery, neuropathy, cataract, depression. PCP is listed as Dr. Robert Bellow.  EKG on 11/30/13 showed SB, LAD, non-specific T wave abnormality.  The interpreting cardiologist did not feel that it was significantly changed from prior EKG on 07/11/06.  Anterior T wave abnormality is less pronounced from 05/12/2006 (see Muse).  Preoperative labs and CXR noted.  Anticipate that he can proceed as planned.  Velna Ochs [facility] Short Stay Center/Anesthesiology Phone [phone] 12/01/2013 3:33 PM

## 2013-12-05 MED ORDER — CEFAZOLIN SODIUM-DEXTROSE 2-3 GM-% IV SOLR
2.0000 g | INTRAVENOUS | Status: AC
  Administered 2013-12-06: 2 g via INTRAVENOUS
  Filled 2013-12-05: qty 50

## 2013-12-06 ENCOUNTER — Ambulatory Visit (HOSPITAL_COMMUNITY): Payer: Medicare Other | Admitting: Critical Care Medicine

## 2013-12-06 ENCOUNTER — Encounter (HOSPITAL_COMMUNITY)
Admission: RE | Disposition: A | Discharge status: Home / Self Care | Payer: Self-pay | Source: Ambulatory Visit | Attending: General Surgery

## 2013-12-06 ENCOUNTER — Encounter (HOSPITAL_COMMUNITY): Payer: Self-pay | Admitting: Critical Care Medicine

## 2013-12-06 ENCOUNTER — Ambulatory Visit (HOSPITAL_COMMUNITY)
Admission: RE | Admit: 2013-12-06 | Discharge: 2013-12-06 | Disposition: A | Discharge status: Home / Self Care | Payer: Medicare Other | Source: Ambulatory Visit | Attending: General Surgery | Admitting: General Surgery

## 2013-12-06 ENCOUNTER — Encounter (HOSPITAL_COMMUNITY): Payer: Medicare Other | Admitting: Vascular Surgery

## 2013-12-06 DIAGNOSIS — C61 Malignant neoplasm of prostate: Secondary | ICD-10-CM | POA: Diagnosis not present

## 2013-12-06 DIAGNOSIS — F329 Major depressive disorder, single episode, unspecified: Secondary | ICD-10-CM | POA: Insufficient documentation

## 2013-12-06 DIAGNOSIS — K4090 Unilateral inguinal hernia, without obstruction or gangrene, not specified as recurrent: Secondary | ICD-10-CM | POA: Diagnosis not present

## 2013-12-06 DIAGNOSIS — I10 Essential (primary) hypertension: Secondary | ICD-10-CM | POA: Diagnosis not present

## 2013-12-06 DIAGNOSIS — Z79899 Other long term (current) drug therapy: Secondary | ICD-10-CM | POA: Diagnosis not present

## 2013-12-06 DIAGNOSIS — F3289 Other specified depressive episodes: Secondary | ICD-10-CM | POA: Insufficient documentation

## 2013-12-06 DIAGNOSIS — Z87891 Personal history of nicotine dependence: Secondary | ICD-10-CM | POA: Insufficient documentation

## 2013-12-06 DIAGNOSIS — G709 Myoneural disorder, unspecified: Secondary | ICD-10-CM | POA: Insufficient documentation

## 2013-12-06 DIAGNOSIS — H269 Unspecified cataract: Secondary | ICD-10-CM | POA: Diagnosis not present

## 2013-12-06 HISTORY — PX: INSERTION OF MESH: SHX5868

## 2013-12-06 HISTORY — PX: INGUINAL HERNIA REPAIR: SHX194

## 2013-12-06 SURGERY — REPAIR, HERNIA, INGUINAL, ADULT
Anesthesia: General | Site: Groin | Laterality: Right

## 2013-12-06 MED ORDER — LIDOCAINE HCL (CARDIAC) 20 MG/ML IV SOLN
INTRAVENOUS | Status: DC | PRN
  Administered 2013-12-06: 80 mg via INTRAVENOUS

## 2013-12-06 MED ORDER — GLYCOPYRROLATE 0.2 MG/ML IJ SOLN
INTRAMUSCULAR | Status: DC | PRN
  Administered 2013-12-06 (×2): 0.1 mg via INTRAVENOUS
  Administered 2013-12-06: 0.6 mg via INTRAVENOUS
  Administered 2013-12-06: 0.1 mg via INTRAVENOUS

## 2013-12-06 MED ORDER — ROCURONIUM BROMIDE 100 MG/10ML IV SOLN
INTRAVENOUS | Status: DC | PRN
  Administered 2013-12-06: 30 mg via INTRAVENOUS

## 2013-12-06 MED ORDER — PROPOFOL 10 MG/ML IV BOLUS
INTRAVENOUS | Status: DC | PRN
  Administered 2013-12-06: 110 mg via INTRAVENOUS

## 2013-12-06 MED ORDER — OXYCODONE-ACETAMINOPHEN 5-325 MG PO TABS: 1.0000 | ORAL_TABLET | ORAL | Status: DC | PRN

## 2013-12-06 MED ORDER — DEXAMETHASONE SODIUM PHOSPHATE 4 MG/ML IJ SOLN
INTRAMUSCULAR | Status: DC | PRN
  Administered 2013-12-06: 4 mg via INTRAVENOUS

## 2013-12-06 MED ORDER — ONDANSETRON HCL 4 MG/2ML IJ SOLN: 4.0000 mg | Freq: Once | INTRAMUSCULAR | Status: DC | PRN

## 2013-12-06 MED ORDER — EPHEDRINE SULFATE 50 MG/ML IJ SOLN
INTRAMUSCULAR | Status: DC | PRN
  Administered 2013-12-06 (×2): 10 mg via INTRAVENOUS
  Administered 2013-12-06: 5 mg via INTRAVENOUS

## 2013-12-06 MED ORDER — 0.9 % SODIUM CHLORIDE (POUR BTL) OPTIME
TOPICAL | Status: DC | PRN
  Administered 2013-12-06: 1000 mL

## 2013-12-06 MED ORDER — LACTATED RINGERS IV SOLN
INTRAVENOUS | Status: DC | PRN
  Administered 2013-12-06 (×2): via INTRAVENOUS

## 2013-12-06 MED ORDER — FENTANYL CITRATE 0.05 MG/ML IJ SOLN
INTRAMUSCULAR | Status: DC | PRN
  Administered 2013-12-06: 50 ug via INTRAVENOUS
  Administered 2013-12-06: 100 ug via INTRAVENOUS
  Administered 2013-12-06: 50 ug via INTRAVENOUS

## 2013-12-06 MED ORDER — HYDROMORPHONE HCL PF 1 MG/ML IJ SOLN: 0.2500 mg | INTRAMUSCULAR | Status: DC | PRN

## 2013-12-06 MED ORDER — LIDOCAINE HCL 4 % MT SOLN
OROMUCOSAL | Status: DC | PRN
  Administered 2013-12-06: 4 mL via TOPICAL

## 2013-12-06 MED ORDER — ONDANSETRON HCL 4 MG/2ML IJ SOLN
INTRAMUSCULAR | Status: DC | PRN
  Administered 2013-12-06: 4 mg via INTRAVENOUS

## 2013-12-06 MED ORDER — NEOSTIGMINE METHYLSULFATE 1 MG/ML IJ SOLN
INTRAMUSCULAR | Status: DC | PRN
  Administered 2013-12-06: 4 mg via INTRAVENOUS

## 2013-12-06 MED ORDER — BUPIVACAINE-EPINEPHRINE (PF) 0.25% -1:200000 IJ SOLN
INTRAMUSCULAR | Status: AC
  Filled 2013-12-06: qty 30

## 2013-12-06 MED ORDER — BUPIVACAINE-EPINEPHRINE 0.25% -1:200000 IJ SOLN
INTRAMUSCULAR | Status: DC | PRN
  Administered 2013-12-06: 20 mL

## 2013-12-06 SURGICAL SUPPLY — 43 items
BLADE SURG 10 STRL SS (BLADE) ×2 IMPLANT
BLADE SURG 15 STRL LF DISP TIS (BLADE) ×1 IMPLANT
BLADE SURG 15 STRL SS (BLADE) ×1
BLADE SURG ROTATE 9660 (MISCELLANEOUS) ×2 IMPLANT
CHLORAPREP W/TINT 26ML (MISCELLANEOUS) ×2 IMPLANT
COVER SURGICAL LIGHT HANDLE (MISCELLANEOUS) ×2 IMPLANT
DECANTER SPIKE VIAL GLASS SM (MISCELLANEOUS) ×2 IMPLANT
DERMABOND ADVANCED (GAUZE/BANDAGES/DRESSINGS) ×1
DERMABOND ADVANCED .7 DNX12 (GAUZE/BANDAGES/DRESSINGS) ×1 IMPLANT
DRAIN PENROSE 1/2X12 LTX STRL (WOUND CARE) ×2 IMPLANT
DRAPE LAPAROSCOPIC ABDOMINAL (DRAPES) ×2 IMPLANT
DRAPE PROXIMA HALF (DRAPES) ×4 IMPLANT
DRAPE UTILITY 15X26 W/TAPE STR (DRAPE) ×4 IMPLANT
ELECT CAUTERY BLADE 6.4 (BLADE) ×2 IMPLANT
ELECT REM PT RETURN 9FT ADLT (ELECTROSURGICAL) ×2
ELECTRODE REM PT RTRN 9FT ADLT (ELECTROSURGICAL) ×1 IMPLANT
GLOVE BIO SURGEON STRL SZ7.5 (GLOVE) ×8 IMPLANT
GOWN STRL NON-REIN LRG LVL3 (GOWN DISPOSABLE) ×4 IMPLANT
KIT BASIN OR (CUSTOM PROCEDURE TRAY) ×2 IMPLANT
KIT ROOM TURNOVER OR (KITS) ×2 IMPLANT
MESH ULTRAPRO 3X6 7.6X15CM (Mesh General) ×2 IMPLANT
NEEDLE HYPO 25GX1X1/2 BEV (NEEDLE) ×2 IMPLANT
NS IRRIG 1000ML POUR BTL (IV SOLUTION) ×2 IMPLANT
PACK SURGICAL SETUP 50X90 (CUSTOM PROCEDURE TRAY) ×2 IMPLANT
PAD ARMBOARD 7.5X6 YLW CONV (MISCELLANEOUS) ×4 IMPLANT
PENCIL BUTTON HOLSTER BLD 10FT (ELECTRODE) ×2 IMPLANT
SPONGE LAP 18X18 X RAY DECT (DISPOSABLE) ×2 IMPLANT
SUT MNCRL AB 4-0 PS2 18 (SUTURE) ×2 IMPLANT
SUT PROLENE 2 0 SH DA (SUTURE) ×4 IMPLANT
SUT SILK 2 0 SH (SUTURE) IMPLANT
SUT SILK 3 0 (SUTURE) ×1
SUT SILK 3-0 18XBRD TIE 12 (SUTURE) ×1 IMPLANT
SUT VIC AB 0 CT1 27 (SUTURE) ×1
SUT VIC AB 0 CT1 27XBRD ANBCTR (SUTURE) ×1 IMPLANT
SUT VIC AB 2-0 SH 27 (SUTURE) ×1
SUT VIC AB 2-0 SH 27X BRD (SUTURE) ×1 IMPLANT
SUT VIC AB 3-0 SH 27 (SUTURE) ×1
SUT VIC AB 3-0 SH 27XBRD (SUTURE) ×1 IMPLANT
SYR BULB 3OZ (MISCELLANEOUS) ×2 IMPLANT
SYR CONTROL 10ML LL (SYRINGE) ×2 IMPLANT
TOWEL OR 17X24 6PK STRL BLUE (TOWEL DISPOSABLE) ×2 IMPLANT
TOWEL OR 17X26 10 PK STRL BLUE (TOWEL DISPOSABLE) ×2 IMPLANT
WATER STERILE IRR 1000ML POUR (IV SOLUTION) IMPLANT

## 2013-12-06 NOTE — Anesthesia Procedure Notes (Signed)
Procedure Name: Intubation Date/Time: 12/06/2013 8:36 AM Performed by: Elon Alas Pre-anesthesia Checklist: Patient identified, Timeout performed, Emergency Drugs available, Suction available and Patient being monitored Patient Re-evaluated:Patient Re-evaluated prior to inductionOxygen Delivery Method: Circle system utilized Preoxygenation: Pre-oxygenation with 100% oxygen Intubation Type: IV induction Ventilation: Mask ventilation without difficulty and Oral airway inserted - appropriate to patient size Laryngoscope Size: Miller and 3 Grade View: Grade I Tube type: Oral Tube size: 7.5 mm Number of attempts: 1 Airway Equipment and Method: Stylet and LTA kit utilized Placement Confirmation: ETT inserted through vocal cords under direct vision,  positive ETCO2 and breath sounds checked- equal and bilateral Secured at: 23 cm Tube secured with: Tape Dental Injury: Teeth and Oropharynx as per pre-operative assessment

## 2013-12-06 NOTE — Anesthesia Postprocedure Evaluation (Signed)
  Anesthesia Post-op Note  Patient: Casey Fisher  Procedure(s) Performed: Procedure(s): HERNIA REPAIR INGUINAL ADULT (Right) INSERTION OF MESH (Right)  Patient Location: PACU  Anesthesia Type:General  Level of Consciousness: awake, alert , oriented and patient cooperative  Airway and Oxygen Therapy: Patient Spontanous Breathing  Post-op Pain: moderate  Post-op Assessment: Post-op Vital signs reviewed, Patient's Cardiovascular Status Stable, Respiratory Function Stable, Patent Airway, No signs of Nausea or vomiting and Pain level controlled  Post-op Vital Signs: stable  Complications: No apparent anesthesia complications

## 2013-12-06 NOTE — Anesthesia Preprocedure Evaluation (Addendum)
Anesthesia Evaluation  Patient identified by MRN, date of birth, ID band Patient awake    Reviewed: Allergy & Precautions, H&P , NPO status , Patient's Chart, lab work & pertinent test results  Airway Mallampati: I TM Distance: >3 FB Neck ROM: Full    Dental  (+) Dental Advisory Given, Edentulous Upper and Edentulous Lower   Pulmonary former smoker,          Cardiovascular hypertension, Pt. on medications     Neuro/Psych PSYCHIATRIC DISORDERS Depression  Neuromuscular disease    GI/Hepatic   Endo/Other    Renal/GU      Musculoskeletal   Abdominal   Peds  Hematology   Anesthesia Other Findings   Reproductive/Obstetrics                         Anesthesia Physical Anesthesia Plan  ASA: III  Anesthesia Plan: General   Post-op Pain Management:    Induction: Intravenous  Airway Management Planned: Oral ETT  Additional Equipment:   Intra-op Plan:   Post-operative Plan: Extubation in OR  Informed Consent: I have reviewed the patients History and Physical, chart, labs and discussed the procedure including the risks, benefits and alternatives for the proposed anesthesia with the patient or authorized representative who has indicated his/her understanding and acceptance.   Dental advisory given  Plan Discussed with: Anesthesiologist and Surgeon  Anesthesia Plan Comments:        Anesthesia Quick Evaluation

## 2013-12-06 NOTE — Progress Notes (Signed)
Pt unable to urinate. To Short Stay [facility] , report to [place]. RN as primary caregiver

## 2013-12-06 NOTE — Op Note (Signed)
12/06/2013  9:48 AM  PATIENT:  Casey Fisher  79 y.o. male  PRE-OPERATIVE DIAGNOSIS:  right inguinal hernia  POST-OPERATIVE DIAGNOSIS:  right inguinal hernia  PROCEDURE:  Procedure(s): HERNIA REPAIR INGUINAL ADULT (Right) INSERTION OF MESH (Right)  SURGEON:  Surgeon(s) and Role:    * Robyne Askew, MD - Primary  PHYSICIAN ASSISTANT:   ASSISTANTS: none   ANESTHESIA:   general  EBL:     BLOOD ADMINISTERED:none  DRAINS: none   LOCAL MEDICATIONS USED:  MARCAINE     SPECIMEN:  No Specimen  DISPOSITION OF SPECIMEN:  N/A  COUNTS:  YES  TOURNIQUET:  * No tourniquets in log *  DICTATION: .Dragon Dictation After informed consent was obtained patient brought to the operating room and placed in the supine position on the operating table. After adequate induction general anesthesia the patient's abdomen and right groin area were prepped with ChloraPrep, allowed to dry, and draped in usual sterile manner. The right groin area was then infiltrated with quarter percent Marcaine. A small incision was made from the edge of the pubic tubercle on the right towards the anterior superior iliac spine. This incision was carried through the skin and subcutaneous tissue sharply with electrocautery until the fascia of the external oblique was encountered. The external oblique fascia was opened along its fibers towards the apex of the external ring with a 15 blade knife and Metzenbaum scissors. A. Wheatland retractor was deployed. Blunt dissection was carried out of the cord structures until they could be surrounded between 2 fingers. A half-inch Penrose drain was placed around the cord structures for traction purposes. The ilioinguinal nerve was identified and involved with some scar tissue. It was dissected free proximally and distally and then clamped with hemostats, divided, and ligated with 3-0 silk ties. The cord structures were bluntly skeletonized by hemostat dissection until the hernia  sac was identified. The hernia sac was small with a thickwalled fluid to suggest a sliding hernia. Because of this we elected to reduce the hernia and then repair the floor with interrupted 0 Vicryl stitches. A 3 x 6 piece of ultra Pro mesh was then chosen and cut to fit. The mesh was sewed inferiorly to the shelving edge of the inguinal ligament with a running 2-0 Prolene stitch. Tails were cut in the mesh laterally and the tails were wrapped around the cord structures. Superiorly the mesh was sewed to the musculoaponeurotic strength of the transversalis fascia with interrupted 2-0 Prolene vertical mattress stitches. Lateral to the cord the tails of the mesh were anchored to the shelving edge of the inguinal ligament with an interrupted 2-0 Prolene stitch. Once this was accomplished the mesh appeared to be in good position without any tension and the hernia seemed to be well repaired. The wound was irrigated with copious amounts of saline. The external oblique fascia was reapproximated with a running 2-0 Vicryl stitch. The wound was then infiltrated with 1/4% Marcaine. The subcutaneous fascia was then closed with a running 3-0 Vicryl stitch. The skin was then closed with a running 4-0 Monocryl subcuticular stitch. Dermabond dressings were applied. The patient tolerated the procedure well. At the end of the case a medial sponge and instrument counts were correct. The patient was then awakened and taken to recovery in stable condition. The patient's testicles in the scrotum at the end of the case.  PLAN OF CARE: Discharge to home after PACU  PATIENT DISPOSITION:  PACU - hemodynamically stable.   Delay start of  Pharmacological VTE agent (>24hrs) due to surgical blood loss or risk of bleeding: not applicable

## 2013-12-06 NOTE — Preoperative (Signed)
Beta Blockers   Reason not to administer Beta Blockers:Not Applicable 

## 2013-12-06 NOTE — Interval H&P Note (Signed)
History and Physical Interval Note:  12/06/2013 8:19 AM  Casey Fisher  has presented today for surgery, with the diagnosis of right inguinal hernia  The various methods of treatment have been discussed with the patient and family. After consideration of risks, benefits and other options for treatment, the patient has consented to  Procedure(s): HERNIA REPAIR INGUINAL ADULT (Right) INSERTION OF MESH (Right) as a surgical intervention .  The patient's history has been reviewed, patient examined, no change in status, stable for surgery.  I have reviewed the patient's chart and labs.  Questions were answered to the patient's satisfaction.     TOTH III,PAUL S

## 2013-12-06 NOTE — Progress Notes (Signed)
DR TOTH NOTIFIED CLIENT STATES BLADDER DOES NOT FEEL EMPTY BUT DOES NOT FEEL UNCOMFORTABLE AFTER VOIDED TWICE TOTAL OF 100CC AND PER DR TOTH OK TO D/C HOME AND ADVISE TO RETURN TO ER IF DIFFICULTY VOIDING AND CLIENT AND HIS DAUGHTER ADVISED OF THIS AND VOICED UNDERSTANDING

## 2013-12-06 NOTE — Transfer of Care (Signed)
Immediate Anesthesia Transfer of Care Note  Patient: Casey Fisher  Procedure(s) Performed: Procedure(s): HERNIA REPAIR INGUINAL ADULT (Right) INSERTION OF MESH (Right)  Patient Location: PACU  Anesthesia Type:General  Level of Consciousness: awake, alert  and oriented  Airway & Oxygen Therapy: Patient Spontanous Breathing and Patient connected to nasal cannula oxygen  Post-op Assessment: Report given to PACU RN, Post -op Vital signs reviewed and stable and Patient moving all extremities X 4  Post vital signs: Reviewed and stable  Complications: No apparent anesthesia complications

## 2013-12-06 NOTE — H&P (Signed)
Casey Fisher  10/30/2013 1:50 PM   Office Visit  MRN:  [id number]   Description: 79 year old male  Provider: Robyne Askew, MD  Department: [facility]          Diagnoses      Right inguinal hernia    -  Primary      550.90             Reason for Visit      Hernia             Current Vitals - Last Recorded      BP Pulse Temp(Src) Resp Ht Wt      125/81 72 97.1 F (36.2 C) (Oral) 14 6\' 2"  (1.88 m) 172 lb 6.4 oz (78.2 kg)            BMI                22.13 kg/m2                        Progress Notes      Robyne Askew, MD at 10/30/2013  2:25 PM      Status: Signed            Patient ID: Casey Fisher, male   DOB: [birth year], 79 y.o.   MRN: [id number]    Chief Complaint   Patient presents with   .  Hernia        HPI Casey Fisher is a 79 y.o. male.  We're asked to see the patient in consultation by Dr. Perrin Maltese to evaluate him for a right inguinal hernia. The patient is an 79 year old white male who has a history of a right inguinal hernia repaired in 1985. He was doing well but in April his best friend felony had a left amount of the bed. It was shortly after this that he started noticing some bulging and discomfort in his right groin. He denies any nausea or vomiting. His bowels go back and forth between constipation and lives. He states that he has had about a 20 pound weight loss since April. He received Lupron injections for his prostate cancer  HPI    Past Medical History   Diagnosis  Date   .  Cancer     .  Hypertension     .  Prostate cancer     .  Neuropathy     .  Depression     .  Neuromuscular disorder     .  Cataract           Past Surgical History   Procedure  Laterality  Date   .  Hernia repair       .  Prostate surgery       .  Eye surgery             Family History   Problem  Relation  Age of Onset   .  Brain cancer  Daughter          Social History History   Substance Use Topics   .  Smoking  status:  Former Games developer   .  Smokeless tobacco:  Not on file   .  Alcohol Use:  No        No Known Allergies    Current Outpatient Prescriptions   Medication  Sig  Dispense  Refill   .  azithromycin (ZITHROMAX) 250 MG  tablet  Use as directed   6 tablet   0   .  calcium-vitamin D (OSCAL WITH D) 500-200 MG-UNIT per tablet  Take 1 tablet by mouth every morning.         .  gabapentin (NEURONTIN) 300 MG capsule  Take 300 mg by mouth 3 (three) times daily.          .  hydrochlorothiazide (HYDRODIURIL) 25 MG tablet  Take 25 mg by mouth every morning.         Marland Kitchen  HYDROcodone-acetaminophen (HYCET) 7.5-325 mg/15 ml solution  Take 5 mLs by mouth every 6 (six) hours as needed for pain (or cough).   240 mL   0   .  simvastatin (ZOCOR) 40 MG tablet  Take 40 mg by mouth every evening.             No current facility-administered medications for this visit.        Review of Systems Review of Systems  Constitutional: Positive for unexpected weight change.  HENT: Negative.   Eyes: Negative.   Respiratory: Negative.   Cardiovascular: Negative.   Gastrointestinal: Positive for constipation.  Endocrine: Negative.   Genitourinary: Negative.   Musculoskeletal: Negative.   Skin: Negative.   Allergic/Immunologic: Negative.   Neurological: Negative.   Hematological: Negative.   Psychiatric/Behavioral: Negative.       Blood pressure 125/81, pulse 72, temperature 97.1 F (36.2 C), temperature source Oral, resp. rate 14, height 6\' 2"  (1.88 m), weight 172 lb 6.4 oz (78.2 kg).   Physical Exam Physical Exam  Constitutional: He is oriented to person, place, and time. He appears well-developed and well-nourished.  HENT:   Head: Normocephalic and atraumatic.  Eyes: Conjunctivae and EOM are normal. Pupils are equal, round, and reactive to light.  Neck: Normal range of motion. Neck supple.  Cardiovascular: Normal rate, regular rhythm and normal heart sounds.   Pulmonary/Chest: Effort normal and  breath sounds normal.  Abdominal: Soft. Bowel sounds are normal.  Genitourinary:  There is a palpable bulge in the right groin it reduces easily  Musculoskeletal: Normal range of motion.  Neurological: He is alert and oriented to person, place, and time.  Skin: Skin is warm and dry.  Psychiatric: He has a normal mood and affect. His behavior is normal.      Data Reviewed As above   Assessment    The patient has a recurrent right inguinal hernia. Because of the risk of incarceration strenuousness think he would benefit from having this fixed. He would also like to have it done. I've discussed with him in detail the risks and benefits the operation to fix the hernia as well as some of the technical aspects including the risk of injury to the vas and the testicular artery as well as the use of mesh and he understands and wishes to proceed      Plan    Plan for right inguinal hernia repair with mesh

## 2013-12-07 ENCOUNTER — Encounter (HOSPITAL_COMMUNITY): Payer: Self-pay | Admitting: General Surgery

## 2013-12-08 ENCOUNTER — Telehealth (INDEPENDENT_AMBULATORY_CARE_PROVIDER_SITE_OTHER): Payer: Self-pay

## 2013-12-08 NOTE — Telephone Encounter (Addendum)
Pt is s/p hernia repair on 12/06/13 by Dr. Carolynne Edouard.  His daughter is calling to report he has yet to have a bowel movement. Pt having no chills, fever, N/V, urinating. Instructions were given to start Miralax 17 gm daily, increase fluid intake, ambulate as able, and try to slowly wean off of narcotic pain medication.  She was instructed to call back if no results in 48 hours.  She understood and agreed with POC.

## 2013-12-09 ENCOUNTER — Telehealth (INDEPENDENT_AMBULATORY_CARE_PROVIDER_SITE_OTHER): Payer: Self-pay | Admitting: General Surgery

## 2013-12-09 NOTE — Telephone Encounter (Signed)
Patient's daughter called stating patient was having constipation and no bowel movement since surgery. They have been doing MiraLAX daily. Suggested increasing MiraLAX to twice-daily and doing a suppository. Patient having some mild nausea but able to tolerate liquids and some solids without difficulty.

## 2013-12-18 ENCOUNTER — Ambulatory Visit (INDEPENDENT_AMBULATORY_CARE_PROVIDER_SITE_OTHER): Payer: Medicare Other

## 2013-12-18 DIAGNOSIS — Z23 Encounter for immunization: Secondary | ICD-10-CM | POA: Diagnosis not present

## 2013-12-25 ENCOUNTER — Telehealth (INDEPENDENT_AMBULATORY_CARE_PROVIDER_SITE_OTHER): Payer: Self-pay

## 2013-12-25 NOTE — Telephone Encounter (Signed)
Message copied by Brennan Bailey on Mon Dec 25, 2013  9:38 AM ------      Message from: Fatima Sanger      Created: Mon Dec 18, 2013 11:51 AM       Pt needs a postop with PT.  Surgery was 12/10.      Thanks. ------

## 2013-12-25 NOTE — Telephone Encounter (Signed)
Left appt info on VM 

## 2013-12-27 ENCOUNTER — Other Ambulatory Visit (HOSPITAL_COMMUNITY): Payer: Self-pay | Admitting: Urology

## 2013-12-27 DIAGNOSIS — C61 Malignant neoplasm of prostate: Secondary | ICD-10-CM

## 2014-01-05 ENCOUNTER — Ambulatory Visit (INDEPENDENT_AMBULATORY_CARE_PROVIDER_SITE_OTHER): Payer: Medicare Other | Admitting: Internal Medicine

## 2014-01-05 ENCOUNTER — Telehealth: Payer: Self-pay | Admitting: Radiology

## 2014-01-05 VITALS — BP 134/74 | HR 72 | Temp 98.0°F | Resp 16 | Ht 72.75 in | Wt 176.6 lb

## 2014-01-05 DIAGNOSIS — E785 Hyperlipidemia, unspecified: Secondary | ICD-10-CM

## 2014-01-05 DIAGNOSIS — E876 Hypokalemia: Secondary | ICD-10-CM | POA: Diagnosis not present

## 2014-01-05 DIAGNOSIS — C61 Malignant neoplasm of prostate: Secondary | ICD-10-CM

## 2014-01-05 DIAGNOSIS — I25810 Atherosclerosis of coronary artery bypass graft(s) without angina pectoris: Secondary | ICD-10-CM | POA: Diagnosis not present

## 2014-01-05 LAB — BASIC METABOLIC PANEL
BUN: 14 mg/dL (ref 6–23)
CO2: 31 meq/L (ref 19–32)
CREATININE: 0.69 mg/dL (ref 0.50–1.35)
Calcium: 9.6 mg/dL (ref 8.4–10.5)
Chloride: 101 mEq/L (ref 96–112)
GLUCOSE: 87 mg/dL (ref 70–99)
Potassium: 3.8 mEq/L (ref 3.5–5.3)
Sodium: 139 mEq/L (ref 135–145)

## 2014-01-05 NOTE — Telephone Encounter (Signed)
[facility] has called, patient will participate in Clinical trial for his prostate Cancer. In his pre assessment his Potassium level is 3.3 and he has changes of his Twaves on his EKG . The urologist wants you to review information and advise is patient is okay to participate. Information is being faxed to [place]. I have advised [facility] to have patient come in to see you today in regards to this, for discussion.

## 2014-01-05 NOTE — Progress Notes (Signed)
   Subjective:    Patient ID: Casey Fisher, male    DOB: 10/12/1928, 80 y.o.   MRN: [id number]  HPI  80 year old male presents with:  1. low potassium. research program with [facility] regarding prostate  The blood work [facility] performed showed a low potassium.   2. neuropathy.   He was taking gabapentin, but that medication made him constipated.  He is now taking a medication for constipation.  He requests to restart gabapentin or another medication for neuropathy.    Review of Systems     Objective:   Physical Exam        Assessment & Plan:

## 2014-01-08 ENCOUNTER — Ambulatory Visit (INDEPENDENT_AMBULATORY_CARE_PROVIDER_SITE_OTHER): Payer: Medicare Other | Admitting: General Surgery

## 2014-01-08 ENCOUNTER — Encounter (INDEPENDENT_AMBULATORY_CARE_PROVIDER_SITE_OTHER): Payer: Self-pay | Admitting: General Surgery

## 2014-01-08 VITALS — BP 132/84 | HR 73 | Temp 98.7°F | Resp 14 | Ht 74.0 in | Wt 178.8 lb

## 2014-01-08 DIAGNOSIS — K4090 Unilateral inguinal hernia, without obstruction or gangrene, not specified as recurrent: Secondary | ICD-10-CM

## 2014-01-08 NOTE — Progress Notes (Signed)
Subjective:     Patient ID: Casey Fisher, male   DOB: 04/11/1928, 80 y.o.   MRN: [id number]  HPI The patient is an 80 year old black male who is about one month status post right inguinal hernia repair with mesh. He initially had some issues with urinary retention and constipation but this is all resolved. He denies any abdominal pain. His appetite is good and his bowels are working normally.  Review of Systems     Objective:   Physical Exam On exam his right inguinal incision is healing nicely with no sign of infection. His abdominal wall feels solid with no palpable evidence of recurrence of the hernia. There is a small lipoma of the abdominal wall inferior to the incision    Assessment:     The patient is one month status post right inguinal hernia repair with mesh     Plan:     At this point I would like him to refrain from heavy lifting for another 2 weeks. After this time that he may return to all normal activities. We will plan to see him back on a when necessary basis.

## 2014-01-08 NOTE — Patient Instructions (Signed)
No heavy lifting for another 2 weeks then you may return to normal activities

## 2014-01-16 ENCOUNTER — Encounter (HOSPITAL_COMMUNITY): Payer: Self-pay

## 2014-01-16 ENCOUNTER — Ambulatory Visit (HOSPITAL_COMMUNITY)
Admission: RE | Admit: 2014-01-16 | Discharge: 2014-01-16 | Disposition: A | Discharge status: Home / Self Care | Payer: Medicare Other | Source: Ambulatory Visit | Attending: Urology | Admitting: Urology

## 2014-01-16 DIAGNOSIS — G319 Degenerative disease of nervous system, unspecified: Secondary | ICD-10-CM | POA: Insufficient documentation

## 2014-01-16 DIAGNOSIS — I6789 Other cerebrovascular disease: Secondary | ICD-10-CM | POA: Insufficient documentation

## 2014-01-16 DIAGNOSIS — R29898 Other symptoms and signs involving the musculoskeletal system: Secondary | ICD-10-CM | POA: Insufficient documentation

## 2014-01-16 DIAGNOSIS — R209 Unspecified disturbances of skin sensation: Secondary | ICD-10-CM | POA: Insufficient documentation

## 2014-01-16 DIAGNOSIS — C61 Malignant neoplasm of prostate: Secondary | ICD-10-CM | POA: Insufficient documentation

## 2014-01-16 MED ORDER — IOHEXOL 300 MG/ML  SOLN
100.0000 mL | Freq: Once | INTRAMUSCULAR | Status: AC | PRN
  Administered 2014-01-16: 100 mL via INTRAVENOUS

## 2014-01-17 ENCOUNTER — Ambulatory Visit (HOSPITAL_COMMUNITY): Payer: PRIVATE HEALTH INSURANCE

## 2014-01-17 ENCOUNTER — Encounter (HOSPITAL_COMMUNITY): Payer: PRIVATE HEALTH INSURANCE

## 2014-01-17 ENCOUNTER — Encounter (HOSPITAL_COMMUNITY)
Admission: RE | Admit: 2014-01-17 | Discharge: 2014-01-17 | Disposition: A | Discharge status: Home / Self Care | Payer: Medicare Other | Source: Ambulatory Visit | Attending: Urology | Admitting: Urology

## 2014-01-17 ENCOUNTER — Other Ambulatory Visit (HOSPITAL_COMMUNITY): Payer: PRIVATE HEALTH INSURANCE

## 2014-01-17 DIAGNOSIS — M4120 Other idiopathic scoliosis, site unspecified: Secondary | ICD-10-CM | POA: Insufficient documentation

## 2014-01-17 DIAGNOSIS — C61 Malignant neoplasm of prostate: Secondary | ICD-10-CM | POA: Diagnosis not present

## 2014-01-17 MED ORDER — TECHNETIUM TC 99M MEDRONATE IV KIT
25.4000 | PACK | Freq: Once | INTRAVENOUS | Status: AC | PRN
  Administered 2014-01-17: 25.4 via INTRAVENOUS

## 2014-01-27 ENCOUNTER — Other Ambulatory Visit: Payer: Self-pay | Admitting: Internal Medicine

## 2014-01-29 NOTE — Telephone Encounter (Signed)
Pt was here to see you last month for some chronic issues but don't see that HCTZ was discussed and I don't believe you have Rxd for pt in past. Do you want to RF? Pended Rx.

## 2014-01-30 ENCOUNTER — Encounter (INDEPENDENT_AMBULATORY_CARE_PROVIDER_SITE_OTHER): Payer: Self-pay | Admitting: General Surgery

## 2014-01-30 ENCOUNTER — Encounter (INDEPENDENT_AMBULATORY_CARE_PROVIDER_SITE_OTHER): Payer: Self-pay

## 2014-01-30 ENCOUNTER — Ambulatory Visit (INDEPENDENT_AMBULATORY_CARE_PROVIDER_SITE_OTHER): Payer: Medicare Other | Admitting: General Surgery

## 2014-01-30 VITALS — BP 128/84 | HR 75 | Temp 98.1°F | Resp 16 | Ht 74.0 in | Wt 177.0 lb

## 2014-01-30 DIAGNOSIS — K4090 Unilateral inguinal hernia, without obstruction or gangrene, not specified as recurrent: Secondary | ICD-10-CM

## 2014-01-30 NOTE — Patient Instructions (Signed)
May return to normal activities 

## 2014-01-30 NOTE — Progress Notes (Signed)
Subjective:     Patient ID: Casey Fisher, male   DOB: [birth year], 80 y.o.   MRN: [id number]  HPI The patient is an 80 year old white male who is about 2 months status post right inguinal hernia repair with mesh. He tolerated the surgery well. He has no complaints today. His appetite is good and his bowels are working normally   Review of Systems     Objective:   Physical Exam On exam his right groin incision is healing nicely with no sign of infection. His abdominal wall feels very solid with no palpable evidence of recurrence of the hernia    Assessment:     The patient is 2 months status post right inguinal hernia repair with mesh     Plan:     At this point he may return to all normal activities without restriction. I will plan to see him back on a when necessary basis

## 2014-02-06 ENCOUNTER — Other Ambulatory Visit: Payer: Self-pay | Admitting: Internal Medicine

## 2014-02-07 ENCOUNTER — Telehealth: Payer: Self-pay | Admitting: Internal Medicine

## 2014-02-07 NOTE — Telephone Encounter (Signed)
Patient called stating he was told my doctor Guest that he would recommend him to a primary care doctor here Informed patient of what providers that's accepting new patients he stated he wanted to wait and see who Doctor Guest recommend first  Please advise

## 2014-02-09 NOTE — Telephone Encounter (Signed)
Will hold for Dr. Elder Cyphers to review

## 2014-02-14 DIAGNOSIS — C61 Malignant neoplasm of prostate: Secondary | ICD-10-CM | POA: Diagnosis not present

## 2014-04-06 ENCOUNTER — Ambulatory Visit: Payer: Medicare Other

## 2014-04-06 ENCOUNTER — Other Ambulatory Visit (HOSPITAL_COMMUNITY): Payer: Self-pay | Admitting: Urology

## 2014-04-06 ENCOUNTER — Other Ambulatory Visit: Payer: Self-pay | Admitting: Internal Medicine

## 2014-04-06 ENCOUNTER — Ambulatory Visit (INDEPENDENT_AMBULATORY_CARE_PROVIDER_SITE_OTHER): Payer: Medicare Other | Admitting: Internal Medicine

## 2014-04-06 VITALS — BP 138/60 | HR 66 | Temp 98.1°F | Resp 16 | Ht 72.5 in | Wt 176.0 lb

## 2014-04-06 DIAGNOSIS — I10 Essential (primary) hypertension: Secondary | ICD-10-CM | POA: Diagnosis not present

## 2014-04-06 DIAGNOSIS — M79671 Pain in right foot: Secondary | ICD-10-CM

## 2014-04-06 DIAGNOSIS — M79672 Pain in left foot: Secondary | ICD-10-CM

## 2014-04-06 DIAGNOSIS — Z79899 Other long term (current) drug therapy: Secondary | ICD-10-CM | POA: Diagnosis not present

## 2014-04-06 DIAGNOSIS — M79609 Pain in unspecified limb: Secondary | ICD-10-CM

## 2014-04-06 DIAGNOSIS — C61 Malignant neoplasm of prostate: Secondary | ICD-10-CM

## 2014-04-06 DIAGNOSIS — Z833 Family history of diabetes mellitus: Secondary | ICD-10-CM | POA: Diagnosis not present

## 2014-04-06 DIAGNOSIS — Z131 Encounter for screening for diabetes mellitus: Secondary | ICD-10-CM | POA: Diagnosis not present

## 2014-04-06 DIAGNOSIS — M79673 Pain in unspecified foot: Secondary | ICD-10-CM

## 2014-04-06 LAB — GLUCOSE, POCT (MANUAL RESULT ENTRY): POC Glucose: 100 mg/dl — AB (ref 70–99)

## 2014-04-06 LAB — POCT CBC
Granulocyte percent: 65.2 %G (ref 37–80)
HCT, POC: 40.1 % — AB (ref 43.5–53.7)
HEMOGLOBIN: 12.4 g/dL — AB (ref 14.1–18.1)
Lymph, poc: 1.5 (ref 0.6–3.4)
MCH, POC: 27.2 pg (ref 27–31.2)
MCHC: 30.9 g/dL — AB (ref 31.8–35.4)
MCV: 87.9 fL (ref 80–97)
MID (cbc): 0.3 (ref 0–0.9)
MPV: 9.9 fL (ref 0–99.8)
POC Granulocyte: 3.3 (ref 2–6.9)
POC LYMPH %: 29.4 % (ref 10–50)
POC MID %: 5.4 %M (ref 0–12)
Platelet Count, POC: 207 10*3/uL (ref 142–424)
RBC: 4.56 M/uL — AB (ref 4.69–6.13)
RDW, POC: 14.6 %
WBC: 5 10*3/uL (ref 4.6–10.2)

## 2014-04-06 LAB — POCT GLYCOSYLATED HEMOGLOBIN (HGB A1C): HEMOGLOBIN A1C: 5.4

## 2014-04-06 MED ORDER — GABAPENTIN 300 MG PO CAPS: 300.0000 mg | ORAL_CAPSULE | Freq: Three times a day (TID) | ORAL | Status: DC

## 2014-04-06 NOTE — Patient Instructions (Addendum)
Pregabalin capsules What is this medicine? PREGABALIN (pre GAB a lin) is used to treat nerve pain from diabetes, shingles, spinal cord injury, and fibromyalgia. It is also used to control seizures in epilepsy. This medicine may be used for other purposes; ask your health care provider or pharmacist if you have questions. COMMON BRAND NAME(S): Lyrica What should I tell my health care provider before I take this medicine? They need to know if you have any of these conditions: -bleeding problems -heart disease, including heart failure -history of alcohol or drug abuse -kidney disease -suicidal thoughts, plans, or attempt; a previous suicide attempt by you or a family member -an unusual or allergic reaction to pregabalin, gabapentin, other medicines, foods, dyes, or preservatives -pregnant or trying to get pregnant or trying to conceive with your partner -breast-feeding How should I use this medicine? Take this medicine by mouth with a glass of water. Follow the directions on the prescription label. You can take this medicine with or without food. Take your doses at regular intervals. Do not take your medicine more often than directed. Do not stop taking except on your doctor's advice. A special MedGuide will be given to you by the pharmacist with each prescription and refill. Be sure to read this information carefully each time. Talk to your pediatrician regarding the use of this medicine in children. Special care may be needed. Overdosage: If you think you have taken too much of this medicine contact a poison control center or emergency room at once. NOTE: This medicine is only for you. Do not share this medicine with others. What if I miss a dose? If you miss a dose, take it as soon as you can. If it is almost time for your next dose, take only that dose. Do not take double or extra doses. What may interact with this medicine? -alcohol -certain medicines for blood pressure like captopril,  enalapril, or lisinopril -certain medicines for diabetes, like pioglitazone or rosiglitazone -certain medicines for anxiety or sleep -narcotic medicines for pain This list may not describe all possible interactions. Give your health care provider a list of all the medicines, herbs, non-prescription drugs, or dietary supplements you use. Also tell them if you smoke, drink alcohol, or use illegal drugs. Some items may interact with your medicine. What should I watch for while using this medicine? Tell your doctor or healthcare professional if your symptoms do not start to get better or if they get worse. Visit your doctor or health care professional for regular checks on your progress. Do not stop taking except on your doctor's advice. You may develop a severe reaction. Your doctor will tell you how much medicine to take. Wear a medical identification bracelet or chain if you are taking this medicine for seizures, and carry a card that describes your disease and details of your medicine and dosage times. You may get drowsy or dizzy. Do not drive, use machinery, or do anything that needs mental alertness until you know how this medicine affects you. Do not stand or sit up quickly, especially if you are an older patient. This reduces the risk of dizzy or fainting spells. Alcohol may interfere with the effect of this medicine. Avoid alcoholic drinks. If you have a heart condition, like congestive heart failure, and notice that you are retaining water and have swelling in your hands or feet, contact your health care provider immediately. The use of this medicine may increase the chance of suicidal thoughts or actions. Pay special attention   to how you are responding while on this medicine. Any worsening of mood, or thoughts of suicide or dying should be reported to your health care professional right away. This medicine has caused reduced sperm counts in some men. This may interfere with the ability to father a  child. You should talk to your doctor or health care professional if you are concerned about your fertility. Women who become pregnant while using this medicine for seizures may enroll in the [place] Pregnancy Registry by calling [phone]. This registry collects information about the safety of antiepileptic drug use during pregnancy. What side effects may I notice from receiving this medicine? Side effects that you should report to your doctor or health care professional as soon as possible: -allergic reactions like skin rash, itching or hives, swelling of the face, lips, or tongue -breathing problems -changes in vision -chest pain -confusion -jerking or unusual movements of any part of your body -loss of memory -muscle pain, tenderness, or weakness -suicidal thoughts or other mood changes -swelling of the ankles, feet, hands -unusual bruising or bleeding Side effects that usually do not require medical attention (Report these to your doctor or health care professional if they continue or are bothersome.): -dizziness -drowsiness -dry mouth -headache -nausea -tremors -trouble sleeping -weight gain This list may not describe all possible side effects. Call your doctor for medical advice about side effects. You may report side effects to FDA at 1-800-FDA-1088. Where should I keep my medicine? Keep out of the reach of children. This medicine can be abused. Keep your medicine in a safe place to protect it from theft. Do not share this medicine with anyone. Selling or giving away this medicine is dangerous and against the law. Store at room temperature between 15 and 30 degrees C (59 and 86 degrees F). Throw away any unused medicine after the expiration date. NOTE: This sheet is a summary. It may not cover all possible information. If you have questions about this medicine, talk to your doctor, pharmacist, or health care provider.  2014, [employer].  (2011-06-18 20:00:36) Neuropathic Pain We often think that pain has a physical cause. If we get rid of the cause, the pain should go away. Nerves themselves can also cause pain. It is called neuropathic pain, which means nerve abnormality. It may be difficult for the patients who have it and for the treating caregivers. Pain is usually described as acute (short-lived) or chronic (long-lasting). Acute pain is related to the physical sensations caused by an injury. It can last from a few seconds to many weeks, but it usually goes away when normal healing occurs. Chronic pain lasts beyond the typical healing time. With neuropathic pain, the nerve fibers themselves may be damaged or injured. They then send incorrect signals to other pain centers. The pain you feel is real, but the cause is not easy to find.  CAUSES  Chronic pain can result from diseases, such as diabetes and shingles (an infection related to chickenpox), or from trauma, surgery, or amputation. It can also happen without any known injury or disease. The nerves are sending pain messages, even though there is no identifiable cause for such messages.   Other common causes of neuropathy include diabetes, phantom limb pain, or Regional Pain Syndrome (RPS).  As with all forms of chronic back pain, if neuropathy is not correctly treated, there can be a number of associated problems that lead to a downward cycle for the patient. These include depression, sleeplessness, feelings  of fear and anxiety, limited social interaction and inability to do normal daily activities or work.  The most dramatic and mysterious example of neuropathic pain is called "phantom limb syndrome." This occurs when an arm or a leg has been removed because of illness or injury. The brain still gets pain messages from the nerves that originally carried impulses from the missing limb. These nerves now seem to misfire and cause troubling pain.  Neuropathic pain often seems to have  no cause. It responds poorly to standard pain treatment. Neuropathic pain can occur after:  Shingles (herpes zoster virus infection).  A lasting burning sensation of the skin, caused usually by injury to a peripheral nerve.  Peripheral neuropathy which is widespread nerve damage, often caused by diabetes or alcoholism.  Phantom limb pain following an amputation.  Facial nerve problems (trigeminal neuralgia).  Multiple sclerosis.  Reflex sympathetic dystrophy.  Pain which comes with cancer and cancer chemotherapy.  Entrapment neuropathy such as when pressure is put on a nerve such as in carpal tunnel syndrome.  Back, leg, and hip problems (sciatica).  Spine or back surgery.  HIV Infection or AIDS where nerves are infected by viruses. Your caregiver can explain items in the above list which may apply to you. SYMPTOMS  Characteristics of neuropathic pain are:  Severe, sharp, electric shock-like, shooting, lightening-like, knife-like.  Pins and needles sensation.  Deep burning, deep cold, or deep ache.  Persistent numbness, tingling, or weakness.  Pain resulting from light touch or other stimulus that would not usually cause pain.  Increased sensitivity to something that would normally cause pain, such as a pinprick. Pain may persist for months or years following the healing of damaged tissues. When this happens, pain signals no longer sound an alarm about current injuries or injuries about to happen. Instead, the alarm system itself is not working correctly.  Neuropathic pain may get worse instead of better over time. For some people, it can lead to serious disability. It is important to be aware that severe injury in a limb can occur without a proper, protective pain response.Burns, cuts, and other injuries may go unnoticed. Without proper treatment, these injuries can become infected or lead to further disability. Take any injury seriously, and consult your caregiver for  treatment. DIAGNOSIS  When you have a pain with no known cause, your caregiver will probably ask some specific questions:   Do you have any other conditions, such as diabetes, shingles, multiple sclerosis, or HIV infection?  How would you describe your pain? (Neuropathic pain is often described as shooting, stabbing, burning, or searing.)  Is your pain worse at any time of the day? (Neuropathic pain is usually worse at night.)  Does the pain seem to follow a certain physical pathway?  Does the pain come from an area that has missing or injured nerves? (An example would be phantom limb pain.)  Is the pain triggered by minor things such as rubbing against the sheets at night? These questions often help define the type of pain involved. Once your caregiver knows what is happening, treatment can begin. Anticonvulsant, antidepressant drugs, and various pain relievers seem to work in some cases. If another condition, such as diabetes is involved, better management of that disorder may relieve the neuropathic pain.  TREATMENT  Neuropathic pain is frequently long-lasting and tends not to respond to treatment with narcotic type pain medication. It may respond well to other drugs such as antiseizure and antidepressant medications. Usually, neuropathic problems do not completely  go away, but partial improvement is often possible with proper treatment. Your caregivers have large numbers of medications available to treat you. Do not be discouraged if you do not get immediate relief. Sometimes different medications or a combination of medications will be tried before you receive the results you are hoping for. See your caregiver if you have pain that seems to be coming from nowhere and does not go away. Help is available.  SEEK IMMEDIATE MEDICAL CARE IF:   There is a sudden change in the quality of your pain, especially if the change is on only one side of the body.  You notice changes of the skin, such as  redness, black or purple discoloration, swelling, or an ulcer.  You cannot move the affected limbs. Document Released: 09/10/2004 Document Revised: 03/07/2012 Document Reviewed: 09/10/2004 [facility] Patient Information 2014 [place].

## 2014-04-06 NOTE — Progress Notes (Signed)
   Subjective:    Patient ID: Casey Fisher, male    DOB: [birth year], 80 y.o.   MRN: [id number]  HPI Pt here for feet pain and medication review he would like to know more about the side effects of lyrical. Pt reports constipation due to the medication, he did however stop taking it he was taking for his feet pain. He was taking gabapentin before that, but it was also not helping with his foot pain. Pt also would like to be tested for diabetes because he has noticed soreness on both feet, but worse on the right one. No hx of gout. No red, swelling, warmth of feet.   Review of Systems     Objective:   Physical Exam  Constitutional: He is oriented to person, place, and time. He appears well-developed and well-nourished.  HENT:  Head: Normocephalic.  Eyes: EOM are normal.  Neck: Normal range of motion.  Cardiovascular: Normal rate.   Pulmonary/Chest: Effort normal.  Musculoskeletal: He exhibits tenderness.       Right foot: He exhibits normal range of motion, no tenderness, no bony tenderness, no swelling, normal capillary refill, no crepitus, no deformity and no laceration.       Left foot: He exhibits normal range of motion, no tenderness, no bony tenderness, no swelling, normal capillary refill, no crepitus, no deformity and no laceration.       Feet:  Bunion right  DJD on xrs  Neurological: He is alert and oriented to person, place, and time. He has normal strength. He displays no tremor. A sensory deficit is present. No cranial nerve deficit. Gait abnormal. Coordination normal.  Skin: No rash noted.  Psychiatric: He has a normal mood and affect. His behavior is normal. Thought content normal.     [facility] reading (PRIMARY) by  Dr Elder Cyphers bunion right foot, djd both moderate, no fx seen Results for orders placed in visit on 04/06/14  POCT CBC      Result Value Ref Range   WBC 5.0  4.6 - 10.2 K/uL   Lymph, poc 1.5  0.6 - 3.4   POC LYMPH PERCENT 29.4  10 - 50 %L   MID (cbc) 0.3   0 - 0.9   POC MID % 5.4  0 - 12 %M   POC Granulocyte 3.3  2 - 6.9   Granulocyte percent 65.2  37 - 80 %G   RBC 4.56 (*) 4.69 - 6.13 M/uL   Hemoglobin 12.4 (*) 14.1 - 18.1 g/dL   HCT, POC 40.1 (*) 43.5 - 53.7 %   MCV 87.9  80 - 97 fL   MCH, POC 27.2  27 - 31.2 pg   MCHC 30.9 (*) 31.8 - 35.4 g/dL   RDW, POC 14.6     Platelet Count, POC 207  142 - 424 K/uL   MPV 9.9  0 - 99.8 fL  GLUCOSE, POCT (MANUAL RESULT ENTRY)      Result Value Ref Range   POC Glucose 100 (*) 70 - 99 mg/dl  POCT GLYCOSYLATED HEMOGLOBIN (HGB A1C)      Result Value Ref Range   Hemoglobin A1C 5.4            Assessment & Plan:  Bilateral foot pain/Neuropathy proven by neurology Ready to try higher dose gabapentin Lyrica too many side affects. Star gabapentin 300mg  bid/RTC 1 month

## 2014-04-07 LAB — COMPREHENSIVE METABOLIC PANEL
ALT: 8 U/L (ref 0–53)
AST: 15 U/L (ref 0–37)
Albumin: 3.9 g/dL (ref 3.5–5.2)
Alkaline Phosphatase: 55 U/L (ref 39–117)
BUN: 17 mg/dL (ref 6–23)
CALCIUM: 9.3 mg/dL (ref 8.4–10.5)
CHLORIDE: 103 meq/L (ref 96–112)
CO2: 30 meq/L (ref 19–32)
Creat: 0.83 mg/dL (ref 0.50–1.35)
Glucose, Bld: 89 mg/dL (ref 70–99)
POTASSIUM: 4.1 meq/L (ref 3.5–5.3)
SODIUM: 143 meq/L (ref 135–145)
TOTAL PROTEIN: 6.9 g/dL (ref 6.0–8.3)
Total Bilirubin: 0.4 mg/dL (ref 0.2–1.2)

## 2014-05-07 ENCOUNTER — Ambulatory Visit (INDEPENDENT_AMBULATORY_CARE_PROVIDER_SITE_OTHER): Payer: Medicare Other | Admitting: Internal Medicine

## 2014-05-07 ENCOUNTER — Telehealth: Payer: Self-pay

## 2014-05-07 VITALS — BP 134/68 | HR 66 | Temp 98.1°F | Resp 16 | Ht 72.0 in | Wt 179.0 lb

## 2014-05-07 DIAGNOSIS — R609 Edema, unspecified: Secondary | ICD-10-CM | POA: Diagnosis not present

## 2014-05-07 DIAGNOSIS — G609 Hereditary and idiopathic neuropathy, unspecified: Secondary | ICD-10-CM | POA: Diagnosis not present

## 2014-05-07 DIAGNOSIS — G589 Mononeuropathy, unspecified: Secondary | ICD-10-CM

## 2014-05-07 DIAGNOSIS — G629 Polyneuropathy, unspecified: Secondary | ICD-10-CM

## 2014-05-07 MED ORDER — GABAPENTIN (ONCE-DAILY) 600 MG PO TABS: 600.0000 mg | ORAL_TABLET | Freq: Three times a day (TID) | ORAL | Status: DC

## 2014-05-07 NOTE — Progress Notes (Signed)
Mr. Rahimi is a pleasant 80 years old male and present to [facility] for follow up visit for Neuropathy. The patient stated that he feels better , but his Ankle are still sore and Rt. Ankle is worse than Left. Also, his feet and ankle are swollen.  He was on Lyrica, and had constipation. Stopped Lyrica and went back to Gabapentin one month ago. Patient denied any cough, SOB, wheezing,  chest pain, abdominal pain , diarrhea , nausea with vomiting, or fever.

## 2014-05-07 NOTE — Progress Notes (Signed)
   Subjective:    Patient ID: Casey Fisher, male    DOB: 04/05/1928, 80 y.o.   MRN: [id number]  HPI Follow up for neuropathy pain in feet dxed by Dr. Erling Cruz and previously seen by orthopedic. Trial 300mg  gabapentin helped, no sedation side effects.  Swelling is minimal at 1+, no signs of liver or renal issues and heart is good.   Review of Systems     Objective:   Physical Exam  Constitutional: He is oriented to person, place, and time. He appears well-developed and well-nourished. No distress.  HENT:  Head: Normocephalic.  Eyes: EOM are normal. Pupils are equal, round, and reactive to light. No scleral icterus.  Neck: Normal range of motion.  Cardiovascular: Normal rate, regular rhythm and normal heart sounds.   Pulmonary/Chest: Effort normal and breath sounds normal.  Musculoskeletal: He exhibits edema and tenderness.  Generalized foot ache right greater than left. No point bony tenderness.  Neurological: He is alert and oriented to person, place, and time. He has normal strength. A sensory deficit is present. No cranial nerve deficit. He exhibits normal muscle tone. Coordination normal.  Has sensation in both feet but decreased  No pain at nite just when on his feet.   Psychiatric: He has a normal mood and affect. His behavior is normal.          Assessment & Plan:  Jobst stockings medium strength Increase gabapentin to 600mg  tid  RTC 1 month

## 2014-05-07 NOTE — Patient Instructions (Signed)

## 2014-05-09 NOTE — Telephone Encounter (Signed)
Received fax from [facility] advising that gabapentin does not require a PA, it is covered w/out. Notified pharm, they need to call pharm help desk if still have trouble.

## 2014-05-09 NOTE — Telephone Encounter (Signed)
PA needed for gabapentin 600 TID. Completed form on covermymeds.

## 2014-05-11 ENCOUNTER — Telehealth: Payer: Self-pay

## 2014-05-11 MED ORDER — GABAPENTIN 600 MG PO TABS: 600.0000 mg | ORAL_TABLET | Freq: Three times a day (TID) | ORAL | Status: DC

## 2014-05-11 NOTE — Telephone Encounter (Signed)
Fax from pharm stating that Gralise ER was ordered not generic gabapentin and PA is needed. Checked w/Dr Elder Cyphers and he did not intend to order brand name, instr'd me to change to generic. Sent in new Rx.

## 2014-05-14 DIAGNOSIS — N529 Male erectile dysfunction, unspecified: Secondary | ICD-10-CM | POA: Diagnosis not present

## 2014-05-14 DIAGNOSIS — C61 Malignant neoplasm of prostate: Secondary | ICD-10-CM | POA: Diagnosis not present

## 2014-05-22 ENCOUNTER — Encounter (HOSPITAL_COMMUNITY)
Admission: RE | Admit: 2014-05-22 | Discharge: 2014-05-22 | Disposition: A | Discharge status: Home / Self Care | Payer: Self-pay | Source: Ambulatory Visit | Attending: Urology | Admitting: Urology

## 2014-05-22 DIAGNOSIS — C61 Malignant neoplasm of prostate: Secondary | ICD-10-CM | POA: Insufficient documentation

## 2014-05-22 MED ORDER — TECHNETIUM TC 99M MEDRONATE IV KIT
26.5000 | PACK | Freq: Once | INTRAVENOUS | Status: AC | PRN
  Administered 2014-05-22: 26.5 via INTRAVENOUS

## 2014-06-26 DIAGNOSIS — H01009 Unspecified blepharitis unspecified eye, unspecified eyelid: Secondary | ICD-10-CM | POA: Diagnosis not present

## 2014-07-09 ENCOUNTER — Other Ambulatory Visit (HOSPITAL_COMMUNITY): Payer: Self-pay | Admitting: Urology

## 2014-07-09 DIAGNOSIS — C61 Malignant neoplasm of prostate: Secondary | ICD-10-CM

## 2014-07-09 DIAGNOSIS — Z961 Presence of intraocular lens: Secondary | ICD-10-CM | POA: Diagnosis not present

## 2014-07-18 ENCOUNTER — Other Ambulatory Visit: Payer: Self-pay | Admitting: Internal Medicine

## 2014-08-12 ENCOUNTER — Other Ambulatory Visit: Payer: Self-pay | Admitting: Physician Assistant

## 2014-08-12 ENCOUNTER — Other Ambulatory Visit: Payer: Self-pay | Admitting: Internal Medicine

## 2014-08-22 ENCOUNTER — Other Ambulatory Visit: Payer: Self-pay | Admitting: Internal Medicine

## 2014-08-22 NOTE — Telephone Encounter (Signed)
LMOM for pt CB. What are f/up plans? Dr Elder Cyphers had wanted to see him back in 1 mos after May OV.

## 2014-08-24 ENCOUNTER — Ambulatory Visit (INDEPENDENT_AMBULATORY_CARE_PROVIDER_SITE_OTHER): Payer: Medicare Other | Admitting: Internal Medicine

## 2014-08-24 VITALS — BP 138/68 | HR 60 | Temp 97.5°F | Resp 18 | Ht 72.5 in | Wt 187.6 lb

## 2014-08-24 DIAGNOSIS — R609 Edema, unspecified: Secondary | ICD-10-CM | POA: Diagnosis not present

## 2014-08-24 DIAGNOSIS — G589 Mononeuropathy, unspecified: Secondary | ICD-10-CM | POA: Diagnosis not present

## 2014-08-24 DIAGNOSIS — I25810 Atherosclerosis of coronary artery bypass graft(s) without angina pectoris: Secondary | ICD-10-CM

## 2014-08-24 DIAGNOSIS — C61 Malignant neoplasm of prostate: Secondary | ICD-10-CM | POA: Insufficient documentation

## 2014-08-24 DIAGNOSIS — Z79899 Other long term (current) drug therapy: Secondary | ICD-10-CM | POA: Diagnosis not present

## 2014-08-24 DIAGNOSIS — E538 Deficiency of other specified B group vitamins: Secondary | ICD-10-CM | POA: Diagnosis not present

## 2014-08-24 DIAGNOSIS — I10 Essential (primary) hypertension: Secondary | ICD-10-CM

## 2014-08-24 DIAGNOSIS — E785 Hyperlipidemia, unspecified: Secondary | ICD-10-CM | POA: Diagnosis not present

## 2014-08-24 DIAGNOSIS — G629 Polyneuropathy, unspecified: Secondary | ICD-10-CM

## 2014-08-24 DIAGNOSIS — E049 Nontoxic goiter, unspecified: Secondary | ICD-10-CM

## 2014-08-24 LAB — BASIC METABOLIC PANEL WITH GFR
BUN: 15 mg/dL (ref 6–23)
CHLORIDE: 103 meq/L (ref 96–112)
CO2: 31 mEq/L (ref 19–32)
CREATININE: 0.89 mg/dL (ref 0.50–1.35)
Calcium: 9.2 mg/dL (ref 8.4–10.5)
GFR, Est African American: >89 mL/min
GFR, Est Non African American: 79 mL/min
Glucose, Bld: 89 mg/dL (ref 70–99)
Potassium: 3.8 mEq/L (ref 3.5–5.3)
SODIUM: 140 meq/L (ref 135–145)

## 2014-08-24 LAB — THYROID PANEL WITH TSH
Free Thyroxine Index: 1.9 (ref 1.4–3.8)
T3 UPTAKE: 25 % (ref 22.0–35.0)
T4 TOTAL: 7.5 ug/dL (ref 4.5–12.0)
TSH: 1.892 u[IU]/mL (ref 0.350–4.500)

## 2014-08-24 LAB — VITAMIN B12: VITAMIN B 12: 410 pg/mL (ref 211–911)

## 2014-08-24 LAB — LIPID PANEL
CHOL/HDL RATIO: 2.6 ratio
Cholesterol: 132 mg/dL (ref 0–200)
HDL: 50 mg/dL (ref >39)
LDL Cholesterol: 70 mg/dL (ref 0–99)
Triglycerides: 60 mg/dL (ref <150)
VLDL: 12 mg/dL (ref 0–40)

## 2014-08-24 MED ORDER — GABAPENTIN (ONCE-DAILY) 600 MG PO TABS: 600.0000 mg | ORAL_TABLET | Freq: Three times a day (TID) | ORAL | Status: DC

## 2014-08-24 MED ORDER — SIMVASTATIN 40 MG PO TABS: 40.0000 mg | ORAL_TABLET | Freq: Every day | ORAL | Status: DC

## 2014-08-24 NOTE — Patient Instructions (Signed)
Goiter Goiter is an enlarged thyroid gland. The thyroid gland sits at the base of the front of the neck. The gland produces hormones that regulate mood, body temperature, pulse rate, and digestion. Most goiters are painless and are not a cause for serious concern. Goiters and conditions that cause goiters can be treated if necessary.  CAUSES  Common causes of goiter include:  Graves disease (causes too much hormone to be produced [hyperthyroidism]).  Hashimoto disease (causes too little hormone to be produced [hypothyroidism]).  Thyroiditis (inflammation of the thyroid sometimes caused by virus or pregnancy).  Nodular goiter (small bumps form; sometimes called toxic nodular goiter).  Pregnancy.  Thyroid cancer (very few goiters with nodules are cancerous).  Certain medications.  Radiation exposure.  Iodine deficiency (more common in developing countries in inland populations). RISK FACTORS Risk factors for goiter include:  A family history of goiter.  Male gender.  Inadequate iodine in the diet.  Age older than 25 years. SYMPTOMS  Many goiters do not cause symptoms. When symptoms do occur, they may include:  Swelling in the lower part of the neck. This swelling can range from a very small bump to a large lump.  A tight feeling in the throat.  A hoarse voice. Less commonly, a goiter may result in:  Coughing.  Wheezing.  Difficulty swallowing.  Difficulty breathing.  Bulging neck veins.  Dizziness. When a goiter is the result of hyperthyroidism, symptoms may include:  Rapid or irregular heartbeat.  Sickness in your stomach (nausea).  Vomiting.  Diarrhea.  Shaking.  Irritable feeling.  Bulging eyes.  Weight loss.  Heat sensitivity.  Anxiety. When a goiter is the result of hypothyroidism, symptoms may include:  Tiredness.  Dry skin.  Constipation.  Weight gain.  Irregular menstrual cycle.  Depressed mood.  Sensitivity to  cold. DIAGNOSIS  Tests used to diagnose goiter include:  A physical exam.  Blood tests, including thyroid hormone levels and antibody testing.  Ultrasonography, computerized X-ray scan (computed tomography, CT) or computerized magnetic scan (magnetic resonance imaging, MRI).  Thyroid scan (imaging along with safe radioactive injection).  Tissue sample taken (biopsy) of nodules. This is sometimes done to confirm that the nodules are not cancerous. TREATMENT  Treatment will depend on the cause of the goiter. Treatment may include:  Monitoring. In some cases, no treatment is necessary, and your doctor will monitor your condition at regular checkups.  Medications and supplements. Thyroid medication (thyroid hormone replacement) is available for hyperthyroidism and hypothyroidism.  If inflammation is the cause, over-the-counter medication or steroid medication may be recommended.  Goiters caused by iodine deficiency can be treated with iodine supplements or changes in diet.  Radioactive iodine treatment. Radioactive iodine is injected into the blood. It travels to the thyroid gland, kills thyroid cells, and reduces the size of the gland. This is only used when the thyroid gland is overactive. Lifelong thyroid hormone medication is often necessary after this treatment.  Surgery. A procedure to remove all or part of the gland may be recommended in severe cases or when cancer is the cause. Hormones can be taken to replace the hormones normally produced by the thyroid. HOME CARE INSTRUCTIONS   Take medications as directed.  Follow your caregiver's recommendations for any dietary changes.  Follow up with your caregiver for further examination and testing, as directed. PREVENTION   If you have a family history of goiter, discuss screening with your doctor.  Make sure you are getting enough iodine in your diet.  Use  of iodized table salt can help prevent iodine deficiency. Document  Released: 06/03/2010 Document Revised: 04/30/2014 Document Reviewed: 06/03/2010 [employer] Patient Information 2015 [employer]. This information is not intended to replace advice given to you by your health care provider. Make sure you discuss any questions you have with your health care provider.  

## 2014-08-24 NOTE — Progress Notes (Signed)
Subjective:    Patient ID: Casey Fisher, male    DOB: 04-Jun-1928, 80 y.o.   MRN: [id number]  HPI 80 yr old [place] American male is here to day for medication refill on Gabapentin 600 mg  and Simvastatin 40. Pt states he is not fasting. Pt states he is also here for a recheck regarding his neuropathy. He states the neuropathy is getting better and he is using the compression socks as well.   No problems now. Is in study for prostate cancer. Many bone scans negative. On Lupron.  Review of Systems     Objective:   Physical Exam  Constitutional: He is oriented to person, place, and time. He appears well-developed and well-nourished. No distress.  HENT:  Head: Normocephalic.  Right Ear: External ear normal.  Left Ear: External ear normal.  Mouth/Throat: Oropharynx is clear and moist.  Eyes: Conjunctivae and EOM are normal. Pupils are equal, round, and reactive to light.  Neck: Normal range of motion. Neck supple. No tracheal deviation present. Thyromegaly present.  Cardiovascular: Normal rate, regular rhythm and normal heart sounds.   Pulmonary/Chest: Effort normal.  Abdominal: Soft. Bowel sounds are normal. He exhibits no mass.  Musculoskeletal: Normal range of motion.  Lymphadenopathy:    He has no cervical adenopathy.  Neurological: He is alert and oriented to person, place, and time. He displays normal reflexes. No cranial nerve deficit. He exhibits normal muscle tone. Coordination abnormal.  Psychiatric: He has a normal mood and affect. His behavior is normal. Thought content normal.     Results for orders placed in visit on 04/06/14  COMPREHENSIVE METABOLIC PANEL      Result Value Ref Range   Sodium 143  135 - 145 mEq/L   Potassium 4.1  3.5 - 5.3 mEq/L   Chloride 103  96 - 112 mEq/L   CO2 30  19 - 32 mEq/L   Glucose, Bld 89  70 - 99 mg/dL   BUN 17  6 - 23 mg/dL   Creat 0.83  0.50 - 1.35 mg/dL   Total Bilirubin 0.4  0.2 - 1.2 mg/dL   Alkaline Phosphatase 55  39 -  117 U/L   AST 15  0 - 37 U/L   ALT 8  0 - 53 U/L   Total Protein 6.9  6.0 - 8.3 g/dL   Albumin 3.9  3.5 - 5.2 g/dL   Calcium 9.3  8.4 - 10.5 mg/dL  POCT CBC      Result Value Ref Range   WBC 5.0  4.6 - 10.2 K/uL   Lymph, poc 1.5  0.6 - 3.4   POC LYMPH PERCENT 29.4  10 - 50 %L   MID (cbc) 0.3  0 - 0.9   POC MID % 5.4  0 - 12 %M   POC Granulocyte 3.3  2 - 6.9   Granulocyte percent 65.2  37 - 80 %G   RBC 4.56 (*) 4.69 - 6.13 M/uL   Hemoglobin 12.4 (*) 14.1 - 18.1 g/dL   HCT, POC 40.1 (*) 43.5 - 53.7 %   MCV 87.9  80 - 97 fL   MCH, POC 27.2  27 - 31.2 pg   MCHC 30.9 (*) 31.8 - 35.4 g/dL   RDW, POC 14.6     Platelet Count, POC 207  142 - 424 K/uL   MPV 9.9  0 - 99.8 fL  GLUCOSE, POCT (MANUAL RESULT ENTRY)      Result Value Ref Range  POC Glucose 100 (*) 70 - 99 mg/dl  POCT GLYCOSYLATED HEMOGLOBIN (HGB A1C)      Result Value Ref Range   Hemoglobin A1C 5.4     Screening labs RO B12 deficiency     Assessment & Plan:  Refill gabapentin and simvastatin Possible thyromegaly/US ordered

## 2014-08-26 ENCOUNTER — Encounter: Payer: Self-pay | Admitting: *Deleted

## 2014-08-30 ENCOUNTER — Ambulatory Visit
Admission: RE | Admit: 2014-08-30 | Discharge: 2014-08-30 | Disposition: A | Discharge status: Home / Self Care | Payer: Medicare Other | Source: Ambulatory Visit | Attending: Internal Medicine | Admitting: Internal Medicine

## 2014-08-30 DIAGNOSIS — E042 Nontoxic multinodular goiter: Secondary | ICD-10-CM | POA: Diagnosis not present

## 2014-09-04 ENCOUNTER — Other Ambulatory Visit: Payer: Self-pay | Admitting: Internal Medicine

## 2014-09-06 ENCOUNTER — Ambulatory Visit (HOSPITAL_COMMUNITY)
Admission: RE | Admit: 2014-09-06 | Discharge: 2014-09-06 | Disposition: A | Discharge status: Home / Self Care | Payer: Medicare Other | Source: Ambulatory Visit | Attending: Urology | Admitting: Urology

## 2014-09-06 ENCOUNTER — Encounter (HOSPITAL_COMMUNITY)
Admission: RE | Admit: 2014-09-06 | Discharge: 2014-09-06 | Disposition: A | Discharge status: Home / Self Care | Payer: Medicare Other | Source: Ambulatory Visit | Attending: Urology | Admitting: Urology

## 2014-09-06 DIAGNOSIS — C61 Malignant neoplasm of prostate: Secondary | ICD-10-CM

## 2014-09-06 MED ORDER — TECHNETIUM TC 99M MEDRONATE IV KIT
26.0000 | PACK | Freq: Once | INTRAVENOUS | Status: AC | PRN
  Administered 2014-09-06: 26 via INTRAVENOUS

## 2014-09-07 ENCOUNTER — Other Ambulatory Visit: Payer: Self-pay | Admitting: Internal Medicine

## 2014-09-10 ENCOUNTER — Other Ambulatory Visit (HOSPITAL_COMMUNITY): Payer: Self-pay | Admitting: Urology

## 2014-09-10 DIAGNOSIS — C61 Malignant neoplasm of prostate: Secondary | ICD-10-CM

## 2014-11-08 ENCOUNTER — Telehealth: Payer: Self-pay | Admitting: Family Medicine

## 2014-11-08 ENCOUNTER — Ambulatory Visit (INDEPENDENT_AMBULATORY_CARE_PROVIDER_SITE_OTHER): Payer: Medicare Other | Admitting: Family Medicine

## 2014-11-08 VITALS — BP 126/72 | HR 75 | Temp 98.6°F | Resp 17 | Ht 73.0 in | Wt 180.0 lb

## 2014-11-08 DIAGNOSIS — I25810 Atherosclerosis of coronary artery bypass graft(s) without angina pectoris: Secondary | ICD-10-CM | POA: Diagnosis not present

## 2014-11-08 DIAGNOSIS — Z8546 Personal history of malignant neoplasm of prostate: Secondary | ICD-10-CM | POA: Diagnosis not present

## 2014-11-08 DIAGNOSIS — Z23 Encounter for immunization: Secondary | ICD-10-CM

## 2014-11-08 DIAGNOSIS — G629 Polyneuropathy, unspecified: Secondary | ICD-10-CM | POA: Diagnosis not present

## 2014-11-08 NOTE — Telephone Encounter (Signed)
-----   Message from [name], MD sent at 11/08/2014  9:54 AM EST ----- Medicare physical with Casey Fisher in 3-4 months.

## 2014-11-08 NOTE — Progress Notes (Signed)
Subjective:  This chart was scribed for Casey Agreste, MD by Casey Fisher, ED Scribe. The patient was seen in room 3. Patient's care was started at 9:37 AM.   Patient ID: Casey Fisher, male    DOB: Mar 28, 1928, 80 y.o.   MRN: [id number]  Chief Complaint  Patient presents with  . Immunizations  . Establish Care   HPI HPI Comments: Casey Fisher is a 80 y.o. male who presents to the [facility] for immunizations and to establish care. Previously followed by Casey Fisher. Has a h/o prostate cancer for which he is in a study; last visit was yesterday. Takes Lupron. He takes Simvastatin for hypoliemdia and Gabapentin for neuropathy. He has alao had peripheral edema which he has treated with stockings. Last visit with Casey Fisher was August 2015. He was noted to have possible thyromegaly at that visit. Had a thyroid ultra sound on September 3 with multiple small thyroid nodules bilaterally, none that met criteria for biopsy. Recommended repeat ultra sound in 12 months. TSH and other thyroid labs normal.   Cancer Pt was diagnosed with prostate cancer in 1999. He reports having seed implant in 2000 and another surgery in 2002. Pt states that he has not had any reoccurrences since surgery. He denies fever and any other complaints at this time.   Urologist: Casey Fisher with [facility]   PCP: Casey Portela, MD  Patient Active Problem List   Diagnosis Date Noted  . Prostate cancer 08/24/2014  . Right inguinal hernia 10/30/2013   Past Medical History  Diagnosis Date  . Cancer   . Hypertension   . Prostate cancer   . Neuropathy   . Depression   . Neuromuscular disorder   . Cataract    Past Surgical History  Procedure Laterality Date  . Prostate surgery    . Eye surgery    . Hernia repair  1985  . Inguinal hernia repair Right 12/06/2013    Procedure: HERNIA REPAIR INGUINAL ADULT;  Surgeon: Casey Roof, MD;  Location: [place];  Service: General;   Laterality: Right;  . Insertion of mesh Right 12/06/2013    Procedure: INSERTION OF MESH;  Surgeon: Casey Roof, MD;  Location: [place];  Service: General;  Laterality: Right;   Allergies  Allergen Reactions  . Lyrica [Pregabalin] Other (See Comments)    "constipation"   Prior to Admission medications   Medication Sig Start Date End Date Taking? Authorizing Provider  aspirin EC 81 MG tablet Take 81 mg by mouth daily.   Yes Historical Provider, MD  Calcium Carb-Cholecalciferol (CALCIUM 600 + D PO) Take 1 tablet by mouth daily.   Yes Historical Provider, MD  gabapentin (NEURONTIN) 600 MG tablet Take 1 tablet (600 mg total) by mouth 3 (three) times daily. NO MORE REFILLS WITHOUT OFFICE VISIT - 2ND NOTICE 08/23/14  Yes Casey Flaming, MD  Gabapentin, PHN, 600 MG TABS Take 600 mg by mouth 3 (three) times daily. 08/24/14  Yes Casey Flaming, MD  hydrochlorothiazide (HYDRODIURIL) 25 MG tablet TAKE 1 TABLET BY MOUTH EVERY DAY 01/27/14  Yes Casey Flaming, MD  Leuprolide Acetate (LUPRON IJ) Inject 1 application as directed every 6 (six) months.   Yes Historical Provider, MD  simvastatin (ZOCOR) 40 MG tablet Take 1 tablet (40 mg total) by mouth daily. PATIENT NEEDS OFFICE VISIT FOR ADDITIONAL REFILLS 08/24/14  Yes Casey Flaming, MD   History   Social History  .  Marital Status: Widowed    Spouse Name: N/A    Number of Children: N/A  . Years of Education: N/A   Occupational History  . Not on file.   Social History Main Topics  . Smoking status: Former Research scientist (life sciences)  . Smokeless tobacco: Not on file  . Alcohol Use: No  . Drug Use: No  . Sexual Activity: Not on file   Other Topics Concern  . Not on file   Social History Narrative   Review of Systems  Constitutional: Negative for fever and chills.  Respiratory: Negative for chest tightness and shortness of breath.   Cardiovascular: Negative for chest pain.  Gastrointestinal: Negative for abdominal pain.  Neurological: Negative for dizziness,  syncope, light-headedness and headaches.      Objective:   Physical Exam  Constitutional: He is oriented to person, place, and time. He appears well-developed and well-nourished.  HENT:  Head: Normocephalic and atraumatic.  Eyes: EOM are normal. Pupils are equal, round, and reactive to light.  Neck: No JVD present. Carotid bruit is not present.  Cardiovascular: Normal rate, regular rhythm and normal heart sounds.   No murmur heard. Pulmonary/Chest: Effort normal and breath sounds normal. He has no rales.  Musculoskeletal: He exhibits no edema.  Neurological: He is alert and oriented to person, place, and time.  Skin: Skin is warm and dry.  Psychiatric: He has a normal mood and affect.  Vitals reviewed.   Filed Vitals:   11/08/14 0847  BP: 126/72  Pulse: 75  Temp: 98.6 F (37 C)  TempSrc: Oral  Resp: 17  Height: _0  (1.854 m)  Weight: 180 lb (81.647 kg)  SpO2: 97%      Assessment & Plan:    Casey Fisher is a 80 y.o. male Neuropathy - peripheral.    -cont gabapentin, has stockings for LE edema as needed.   History of prostate cancer  - followed by urology, doing well, in study and on Lupron.   Flu vaccine need - Plan: Flu Vaccine QUAD 36+ mos IM  - flu vaccine given.  Nodular goiter - as above. Plan on repeat US in 9 months.   Plan on recheck in 3 months with Casey Fisher physical with me - establishing care with me, prior pt of Casey Fisher.   No orders of the defined types were placed in this encounter.   Patient Instructions  Nice meeting you today. Flu vaccine given, we will call you to schedule visit with Casey Fisher at appointment center in few months.    I personally performed the services described in this documentation, which was scribed in my presence. The recorded information has been reviewed and considered, and addended by me as needed.

## 2014-11-08 NOTE — Patient Instructions (Signed)
Nice meeting you today. Flu vaccine given, we will call you to schedule visit with Dr. Carlota Raspberry at appointment center in few months.

## 2014-11-08 NOTE — Telephone Encounter (Signed)
APPT made  

## 2014-11-14 DIAGNOSIS — R312 Other microscopic hematuria: Secondary | ICD-10-CM | POA: Diagnosis not present

## 2014-11-14 DIAGNOSIS — C61 Malignant neoplasm of prostate: Secondary | ICD-10-CM | POA: Diagnosis not present

## 2014-12-31 ENCOUNTER — Ambulatory Visit (HOSPITAL_COMMUNITY)
Admission: RE | Admit: 2014-12-31 | Discharge: 2014-12-31 | Disposition: A | Discharge status: Home / Self Care | Payer: Self-pay | Source: Ambulatory Visit | Attending: Urology | Admitting: Urology

## 2014-12-31 ENCOUNTER — Encounter (HOSPITAL_COMMUNITY)
Admission: RE | Admit: 2014-12-31 | Discharge: 2014-12-31 | Disposition: A | Discharge status: Home / Self Care | Payer: Self-pay | Source: Ambulatory Visit | Attending: Urology | Admitting: Urology

## 2014-12-31 DIAGNOSIS — C61 Malignant neoplasm of prostate: Secondary | ICD-10-CM | POA: Insufficient documentation

## 2014-12-31 DIAGNOSIS — J439 Emphysema, unspecified: Secondary | ICD-10-CM | POA: Diagnosis not present

## 2014-12-31 MED ORDER — TECHNETIUM TC 99M MEDRONATE IV KIT
25.0000 | PACK | Freq: Once | INTRAVENOUS | Status: AC | PRN
  Administered 2014-12-31: 25 via INTRAVENOUS

## 2015-02-02 ENCOUNTER — Other Ambulatory Visit: Payer: Self-pay | Admitting: Internal Medicine

## 2015-02-21 DIAGNOSIS — G5761 Lesion of plantar nerve, right lower limb: Secondary | ICD-10-CM | POA: Diagnosis not present

## 2015-02-21 DIAGNOSIS — M7752 Other enthesopathy of left foot: Secondary | ICD-10-CM | POA: Diagnosis not present

## 2015-02-21 DIAGNOSIS — M76822 Posterior tibial tendinitis, left leg: Secondary | ICD-10-CM | POA: Diagnosis not present

## 2015-02-21 DIAGNOSIS — M7751 Other enthesopathy of right foot: Secondary | ICD-10-CM | POA: Diagnosis not present

## 2015-02-21 DIAGNOSIS — G5762 Lesion of plantar nerve, left lower limb: Secondary | ICD-10-CM | POA: Diagnosis not present

## 2015-02-21 DIAGNOSIS — M659 Synovitis and tenosynovitis, unspecified: Secondary | ICD-10-CM | POA: Diagnosis not present

## 2015-02-28 DIAGNOSIS — G5761 Lesion of plantar nerve, right lower limb: Secondary | ICD-10-CM | POA: Diagnosis not present

## 2015-02-28 DIAGNOSIS — M7751 Other enthesopathy of right foot: Secondary | ICD-10-CM | POA: Diagnosis not present

## 2015-02-28 DIAGNOSIS — M76822 Posterior tibial tendinitis, left leg: Secondary | ICD-10-CM | POA: Diagnosis not present

## 2015-02-28 DIAGNOSIS — M7752 Other enthesopathy of left foot: Secondary | ICD-10-CM | POA: Diagnosis not present

## 2015-02-28 DIAGNOSIS — G5762 Lesion of plantar nerve, left lower limb: Secondary | ICD-10-CM | POA: Diagnosis not present

## 2015-03-09 ENCOUNTER — Telehealth: Payer: Self-pay | Admitting: Family Medicine

## 2015-03-09 NOTE — Telephone Encounter (Signed)
Baxter Flattery with [employer] called because patient usually get #90 on Lisinopril and when it was refilled in Feb 2016 he was given #30 with 1 Refill. Authorized change to #90. He is scheduled for f/u in May

## 2015-03-14 DIAGNOSIS — G5762 Lesion of plantar nerve, left lower limb: Secondary | ICD-10-CM | POA: Diagnosis not present

## 2015-03-14 DIAGNOSIS — M7751 Other enthesopathy of right foot: Secondary | ICD-10-CM | POA: Diagnosis not present

## 2015-03-25 ENCOUNTER — Other Ambulatory Visit (HOSPITAL_COMMUNITY): Payer: Self-pay | Admitting: Urology

## 2015-03-25 DIAGNOSIS — C61 Malignant neoplasm of prostate: Secondary | ICD-10-CM

## 2015-04-08 ENCOUNTER — Encounter: Payer: Self-pay | Admitting: Family Medicine

## 2015-04-17 ENCOUNTER — Encounter (HOSPITAL_COMMUNITY)
Admission: RE | Admit: 2015-04-17 | Discharge: 2015-04-17 | Disposition: A | Discharge status: Home / Self Care | Payer: Self-pay | Source: Ambulatory Visit | Attending: Urology | Admitting: Urology

## 2015-04-17 ENCOUNTER — Ambulatory Visit (HOSPITAL_COMMUNITY)
Admission: RE | Admit: 2015-04-17 | Discharge: 2015-04-17 | Disposition: A | Discharge status: Home / Self Care | Payer: Medicare Other | Source: Ambulatory Visit | Attending: Urology | Admitting: Urology

## 2015-04-17 DIAGNOSIS — C61 Malignant neoplasm of prostate: Secondary | ICD-10-CM

## 2015-04-17 MED ORDER — TECHNETIUM TC 99M MEDRONATE IV KIT
25.9000 | PACK | Freq: Once | INTRAVENOUS | Status: AC | PRN
Start: 2015-04-17 — End: 2015-04-17
  Administered 2015-04-17: 25.9 via INTRAVENOUS

## 2015-04-18 ENCOUNTER — Encounter: Payer: Self-pay | Admitting: Family Medicine

## 2015-04-18 ENCOUNTER — Ambulatory Visit (INDEPENDENT_AMBULATORY_CARE_PROVIDER_SITE_OTHER): Payer: Medicare Other | Admitting: Family Medicine

## 2015-04-18 VITALS — BP 128/60 | HR 60 | Temp 98.0°F | Resp 16 | Ht 72.5 in | Wt 182.5 lb

## 2015-04-18 DIAGNOSIS — C61 Malignant neoplasm of prostate: Secondary | ICD-10-CM

## 2015-04-18 DIAGNOSIS — Z0000 Encounter for general adult medical examination without abnormal findings: Secondary | ICD-10-CM | POA: Diagnosis not present

## 2015-04-18 DIAGNOSIS — G629 Polyneuropathy, unspecified: Secondary | ICD-10-CM | POA: Diagnosis not present

## 2015-04-18 DIAGNOSIS — I10 Essential (primary) hypertension: Secondary | ICD-10-CM | POA: Diagnosis not present

## 2015-04-18 DIAGNOSIS — Z23 Encounter for immunization: Secondary | ICD-10-CM | POA: Diagnosis not present

## 2015-04-18 DIAGNOSIS — E785 Hyperlipidemia, unspecified: Secondary | ICD-10-CM | POA: Diagnosis not present

## 2015-04-18 MED ORDER — HYDROCHLOROTHIAZIDE 25 MG PO TABS: 25.0000 mg | ORAL_TABLET | Freq: Every day | ORAL | Status: DC

## 2015-04-18 MED ORDER — SIMVASTATIN 40 MG PO TABS: 40.0000 mg | ORAL_TABLET | Freq: Every day | ORAL | Status: DC

## 2015-04-18 MED ORDER — GABAPENTIN 600 MG PO TABS: 600.0000 mg | ORAL_TABLET | Freq: Three times a day (TID) | ORAL | Status: DC | PRN

## 2015-04-18 NOTE — Progress Notes (Addendum)
Subjective:    Patient ID: Casey Fisher, male    DOB: 07/05/1928, 81 y.o.   MRN: [id number]  HPI  HPI Comments: Casey Fisher is a 81 y.o. male.  Chart Review: Prior pt of Dr. Elder Cyphers. Initially seen by me on November 2015 to establish care. History of prostate cancer, hyperlipidemia, and neuropathy, with peripheral edema.  He presents to the [facility] for a CPE. He also requests a disability placard today.  He has no specific complaints today, although he was concerned about his blood pressure (see below).  Cancer Screening: History of prostate cancer, dx'ed in 1999. S/P seed implant in 2000, surgery in 2002. Takes Lupron. Followed by Dr. Junious Silk with [facility]. He was also in a study as of last visit.   Immunizations: Flu vaccine November 2015. Ask him about pneumonia vaccinations.   Patient is unsure whether he had a pneumonia vaccine lately. We will give him one today.   Depression Screening: PHQ2 score: 0.   Patient seems to be doing well emotionally.  Functional Status Screening: CHL but 'yes' response for difficulty seeing, and 'yes' response for difficulty walking, climbing stairs.    Patient states he has been using a cane for assistance as needed. He is followed by a foot specialist.  Patient last saw his ophthalmologist last year. However, he denies any changes in vision or hearing. He wears glasses.   Patient states he will sometimes fall asleep in a chair he is sitting in, after eating his dinner meal and falling asleep. Patient states he lives at home alone.  IADL's and ADL's are performed by himself. He is comfortable living home alone.   Patient has one daughter in [place], with whom he talks to every day.    Hyperlipidemia: last lipid panel: August 2015, normal. He takes simvastatin 40 mg qd.  Lab Results  Component Value Date   CHOL 132 08/24/2014   HDL 50 08/24/2014   LDLCALC 70 08/24/2014   TRIG 60 08/24/2014   CHOLHDL 2.6 08/24/2014   Patient denies any problems with simvastatin. Patient ate a pear this morning on waking 9 hours ago, and has fasted since.   Hypertension: takes HCTZ 25 mg qd. Last renal test in August 2015, normal. LFT is normal April 2015.   Patient states he had been to another doctor and had a high blood pressure, but his blood pressure today was normal. He states he sometimes wakes up in the morning with headaches.  Peripheral neuropathy: Takes Neuronin 600 mg TID.   Patient is followed by a foot specialist for peripheral neuropathy, and has an ankle brace. Patient takes Neurontin BID, which has been okay for him. He will sometimes use a third pill as needed. He denies lightheadedness as a side effect of this.   Advanced Directives: Patient states he has a living will, and agrees to forward that over.  He states he would not like prolonged lifesaving efforts, but agrees to CPR or initial attempts. Patient would not like to be put on a ventilator.   Health Maintenance: Patient states he has been getting into walking. He uses a cane for assistance occasionally.   Skin: Patient has had dry, hyperpigmented skin on his extremities for "a while." He has been using Aveeno moisturizing lotion.      Patient Active Problem List   Diagnosis Date Noted  . Prostate cancer 08/24/2014  . Right inguinal hernia 10/30/2013   Past Medical History  Diagnosis Date  .  Cancer   . Hypertension   . Prostate cancer   . Neuropathy   . Depression   . Neuromuscular disorder   . Cataract   . Heart murmur   . Anxiety    Past Surgical History  Procedure Laterality Date  . Prostate surgery    . Eye surgery    . Hernia repair  1985  . Inguinal hernia repair Right 12/06/2013    Procedure: HERNIA REPAIR INGUINAL ADULT;  Surgeon: Merrie Roof, MD;  Location: [place];  Service: General;  Laterality: Right;  . Insertion of mesh Right 12/06/2013    Procedure: INSERTION OF MESH;  Surgeon: Merrie Roof, MD;  Location: [place];  Service: General;  Laterality: Right;   Allergies  Allergen Reactions  . Lyrica [Pregabalin] Other (See Comments)    "constipation"   Prior to Admission medications   Medication Sig Start Date End Date Taking? Authorizing Provider  aspirin EC 81 MG tablet Take 81 mg by mouth daily.   Yes Historical Provider, MD  Calcium Carb-Cholecalciferol (CALCIUM 600 + D PO) Take 1 tablet by mouth daily.   Yes Historical Provider, MD  gabapentin (NEURONTIN) 600 MG tablet Take 1 tablet (600 mg total) by mouth 3 (three) times daily. NO MORE REFILLS WITHOUT OFFICE VISIT - 2ND NOTICE 08/23/14  Yes Orma Flaming, MD  hydrochlorothiazide (HYDRODIURIL) 25 MG tablet take 1 tablet by mouth once daily 02/04/15  Yes [name], MD  Leuprolide Acetate (LUPRON IJ) Inject 1 application as directed every 6 (six) months.   Yes Historical Provider, MD  simvastatin (ZOCOR) 40 MG tablet Take 1 tablet (40 mg total) by mouth daily. PATIENT NEEDS OFFICE VISIT FOR ADDITIONAL REFILLS 08/24/14  Yes Orma Flaming, MD  Gabapentin, PHN, 600 MG TABS Take 600 mg by mouth 3 (three) times daily. 08/24/14   Orma Flaming, MD   History   Social History  . Marital Status: Widowed    Spouse Name: N/A  . Number of Children: N/A  . Years of Education: N/A   Occupational History  . Not on file.   Social History Main Topics  . Smoking status: Former Research scientist (life sciences)  . Smokeless tobacco: Not on file  . Alcohol Use: No  . Drug Use: No  . Sexual Activity: Not on file   Other Topics Concern  . Not on file   Social History Narrative    Review of Systems 13 point ROS per patient survey reviewed, and nursing note reviewed as above. Otherwise negative.     Objective:   Physical Exam  Constitutional: He is oriented to person, place, and time. He appears well-developed and well-nourished.  HENT:  Head: Normocephalic and atraumatic.  Eyes: EOM are normal. Pupils are equal, round, and reactive to light.    Neck: No JVD present. Carotid bruit is not present.  Cardiovascular: Normal rate, regular rhythm and normal heart sounds.   No murmur heard. Pulmonary/Chest: Effort normal and breath sounds normal. He has no rales.  Musculoskeletal: He exhibits no edema.  Neurological: He is alert and oriented to person, place, and time.  Skin: Skin is warm and dry.  Dry skin peripherally, with hyperpigmentation.  Psychiatric: He has a normal mood and affect.  Vitals reviewed.   Filed Vitals:   04/18/15 1349  BP: 128/60  Pulse: 60  Temp: 98 F (36.7 C)  TempSrc: Oral  Resp: 16  Height: 6' 0.5" (1.842 m)  Weight: 182 lb 8 oz (82.781  kg)  SpO2: 96%     Visual Acuity Screening   Right eye Left eye Both eyes  Without correction: 20/30 20/30 20/30   With correction:           Assessment & Plan:   DEANTA MINCEY is a 81 y.o. male Medicare annual wellness visit, subsequent  - -anticipatory guidance as below in AVS, Health maintenance items as above in HPI discussed/recommended as applicable.   -discussed daytime nap to lessen sedation during dinner. If persistent sedation with evening meal - rtc to discuss.   Peripheral neuropathy - Plan: gabapentin (NEURONTIN) 600 MG tablet  - stable. Has cane if needed for stability. Form completed for handicap placard.   Hyperlipidemia - Plan: simvastatin (ZOCOR) 40 MG tablet, COMPLETE METABOLIC PANEL WITH GFR, Lipid panel   Prostate cancer  - on lupron, followed by urology.   Essential hypertension - Plan: hydrochlorothiazide (HYDRODIURIL) 25 MG tablet  - stable. Can bring outside office readings to examine further if needed. No med changes.   Need for prophylactic vaccination against Streptococcus pneumoniae (pneumococcus) - Plan: Pneumococcal conjugate vaccine 13-valent IM given.    Meds ordered this encounter  Medications  . gabapentin (NEURONTIN) 600 MG tablet    Sig: Take 1 tablet (600 mg total) by mouth 3 (three) times daily as needed.     Dispense:  270 tablet    Refill:  1  . hydrochlorothiazide (HYDRODIURIL) 25 MG tablet    Sig: Take 1 tablet (25 mg total) by mouth daily.    Dispense:  90 tablet    Refill:  1  . simvastatin (ZOCOR) 40 MG tablet    Sig: Take 1 tablet (40 mg total) by mouth daily.    Dispense:  90 tablet    Refill:  1   Patient Instructions  Take a nap every afternoon, make sure your chair at dinner has side supports.  Follow up in next 4-6 weeks if daytime naps are not helping with nighttime sleepiness at dinner.  Return to the clinic or go to the nearest emergency room if any of your symptoms worsen or new symptoms occur. You should receive a call or letter about your lab results within the next week to 10 days.  If headaches persist in the morning - return to discuss this further.   Keeping you healthy  Get these tests  Blood pressure- Have your blood pressure checked once a year by your healthcare provider.  Normal blood pressure is 120/80  Weight- Have your body mass index (BMI) calculated to screen for obesity.  BMI is a measure of body fat based on height and weight. You can also calculate your own BMI at ViewBanking.si.  Cholesterol- Have your cholesterol checked every year.  Diabetes- Have your blood sugar checked regularly if you have high blood pressure, high cholesterol, have a family history of diabetes or if you are overweight.  Screening for Colon Cancer- Colonoscopy starting at age 34.  Screening may begin sooner depending on your family history and other health conditions. Follow up colonoscopy as directed by your Gastroenterologist.  Screening for Prostate Cancer- Both blood work (PSA) and a rectal exam help screen for Prostate Cancer.  Screening begins at age 75 with African-American men and at age 29 with Caucasian men.  Screening may begin sooner depending on your family history.  Take these medicines  Aspirin- One aspirin daily can help prevent Heart disease and  Stroke.  Flu shot- Every fall.  Tetanus- Every 10 years.  Zostavax- Once after the age of 69 to prevent Shingles.  Pneumonia shot- Once after the age of 52; if you are younger than 34, ask your healthcare provider if you need a Pneumonia shot.  Take these steps  Don't smoke- If you do smoke, talk to your doctor about quitting.  For tips on how to quit, go to www.smokefree.gov or call 1-800-QUIT-NOW.  Be physically active- Exercise 5 days a week for at least 30 minutes.  If you are not already physically active start slow and gradually work up to 30 minutes of moderate physical activity.  Examples of moderate activity include walking briskly, mowing the yard, dancing, swimming, bicycling, etc.  Eat a healthy diet- Eat a variety of healthy food such as fruits, vegetables, low fat milk, low fat cheese, yogurt, lean meant, poultry, fish, beans, tofu, etc. For more information go to www.thenutritionsource.org  Drink alcohol in moderation- Limit alcohol intake to less than two drinks a day. Never drink and drive.  Dentist- Brush and floss twice daily; visit your dentist twice a year.  Depression- Your emotional health is as important as your physical health. If you're feeling down, or losing interest in things you would normally enjoy please talk to your healthcare provider.  Eye exam- Visit your eye doctor every year.  Safe sex- If you may be exposed to a sexually transmitted infection, use a condom.  Seat belts- Seat belts can save your life; always wear one.  Smoke/Carbon Monoxide detectors- These detectors need to be installed on the appropriate level of your home.  Replace batteries at least once a year.  Skin cancer- When out in the sun, cover up and use sunscreen 15 SPF or higher.  Violence- If anyone is threatening you, please tell your healthcare provider.  Living Will/ Health care power of attorney- Speak with your healthcare provider and family.    I personally performed  the services described in this documentation, which was scribed in my presence. The recorded information has been reviewed and considered, and addended by me as needed.

## 2015-04-18 NOTE — Patient Instructions (Addendum)
Take a nap every afternoon, make sure your chair at dinner has side supports.  Follow up in next 4-6 weeks if daytime naps are not helping with nighttime sleepiness at dinner.  Return to the clinic or go to the nearest emergency room if any of your symptoms worsen or new symptoms occur. You should receive a call or letter about your lab results within the next week to 10 days.  If headaches persist in the morning - return to discuss this further.   Keeping you healthy  Get these tests  Blood pressure- Have your blood pressure checked once a year by your healthcare provider.  Normal blood pressure is 120/80  Weight- Have your body mass index (BMI) calculated to screen for obesity.  BMI is a measure of body fat based on height and weight. You can also calculate your own BMI at [employer].  Cholesterol- Have your cholesterol checked every year.  Diabetes- Have your blood sugar checked regularly if you have high blood pressure, high cholesterol, have a family history of diabetes or if you are overweight.  Screening for Colon Cancer- Colonoscopy starting at age 39.  Screening may begin sooner depending on your family history and other health conditions. Follow up colonoscopy as directed by your Gastroenterologist.  Screening for Prostate Cancer- Both blood work (PSA) and a rectal exam help screen for Prostate Cancer.  Screening begins at age 104 with African-American men and at age 56 with Caucasian men.  Screening may begin sooner depending on your family history.  Take these medicines  Aspirin- One aspirin daily can help prevent Heart disease and Stroke.  Flu shot- Every fall.  Tetanus- Every 10 years.  Zostavax- Once after the age of 55 to prevent Shingles.  Pneumonia shot- Once after the age of 64; if you are younger than 63, ask your healthcare provider if you need a Pneumonia shot.  Take these steps  Don't smoke- If you do smoke, talk to your doctor about quitting.   For tips on how to quit, go to www.smokefree.gov or call 1-800-QUIT-NOW.  Be physically active- Exercise 5 days a week for at least 30 minutes.  If you are not already physically active start slow and gradually work up to 30 minutes of moderate physical activity.  Examples of moderate activity include walking briskly, mowing the yard, dancing, swimming, bicycling, etc.  Eat a healthy diet- Eat a variety of healthy food such as fruits, vegetables, low fat milk, low fat cheese, yogurt, lean meant, poultry, fish, beans, tofu, etc. For more information go to www.thenutritionsource.org  Drink alcohol in moderation- Limit alcohol intake to less than two drinks a day. Never drink and drive.  Dentist- Brush and floss twice daily; visit your dentist twice a year.  Depression- Your emotional health is as important as your physical health. If you're feeling down, or losing interest in things you would normally enjoy please talk to your healthcare provider.  Eye exam- Visit your eye doctor every year.  Safe sex- If you may be exposed to a sexually transmitted infection, use a condom.  Seat belts- Seat belts can save your life; always wear one.  Smoke/Carbon Monoxide detectors- These detectors need to be installed on the appropriate level of your home.  Replace batteries at least once a year.  Skin cancer- When out in the sun, cover up and use sunscreen 15 SPF or higher.  Violence- If anyone is threatening you, please tell your healthcare provider.  Living Will/ Health care power of  attorney- Speak with your healthcare provider and family.

## 2015-04-18 NOTE — Progress Notes (Signed)
   Subjective:    Patient ID: Casey Fisher, male    DOB: 1928-08-25, 81 y.o.   MRN: [id number]  HPI    Review of Systems  Constitutional: Positive for appetite change.  HENT: Negative.   Eyes: Negative.   Respiratory: Negative.   Cardiovascular: Positive for leg swelling.  Gastrointestinal: Negative.   Endocrine: Negative.   Genitourinary: Negative.   Musculoskeletal: Positive for myalgias and back pain.  Skin: Negative.   Allergic/Immunologic: Negative.   Neurological: Negative.   Hematological: Negative.   Psychiatric/Behavioral: Negative.        Objective:   Physical Exam        Assessment & Plan:

## 2015-04-19 LAB — COMPLETE METABOLIC PANEL WITH GFR
ALBUMIN: 4.1 g/dL (ref 3.5–5.2)
ALT: 10 U/L (ref 0–53)
AST: 17 U/L (ref 0–37)
Alkaline Phosphatase: 62 U/L (ref 39–117)
BUN: 15 mg/dL (ref 6–23)
CHLORIDE: 101 meq/L (ref 96–112)
CO2: 27 mEq/L (ref 19–32)
CREATININE: 0.85 mg/dL (ref 0.50–1.35)
Calcium: 9.4 mg/dL (ref 8.4–10.5)
GFR, EST NON AFRICAN AMERICAN: 79 mL/min
GFR, Est African American: >89 mL/min
GLUCOSE: 86 mg/dL (ref 70–99)
POTASSIUM: 3.4 meq/L — AB (ref 3.5–5.3)
Sodium: 141 mEq/L (ref 135–145)
Total Bilirubin: 0.7 mg/dL (ref 0.2–1.2)
Total Protein: 7.4 g/dL (ref 6.0–8.3)

## 2015-04-19 LAB — LIPID PANEL
CHOLESTEROL: 171 mg/dL (ref 0–200)
HDL: 66 mg/dL (ref >=40)
LDL Cholesterol: 95 mg/dL (ref 0–99)
TRIGLYCERIDES: 51 mg/dL (ref <150)
Total CHOL/HDL Ratio: 2.6 Ratio
VLDL: 10 mg/dL (ref 0–40)

## 2015-04-24 DIAGNOSIS — N390 Urinary tract infection, site not specified: Secondary | ICD-10-CM | POA: Diagnosis not present

## 2015-04-24 DIAGNOSIS — R312 Other microscopic hematuria: Secondary | ICD-10-CM | POA: Diagnosis not present

## 2015-05-15 DIAGNOSIS — N359 Urethral stricture, unspecified: Secondary | ICD-10-CM | POA: Diagnosis not present

## 2015-05-15 DIAGNOSIS — C61 Malignant neoplasm of prostate: Secondary | ICD-10-CM | POA: Diagnosis not present

## 2015-05-15 DIAGNOSIS — R310 Gross hematuria: Secondary | ICD-10-CM | POA: Diagnosis not present

## 2015-05-16 ENCOUNTER — Other Ambulatory Visit (HOSPITAL_COMMUNITY): Payer: Self-pay | Admitting: Urology

## 2015-05-16 DIAGNOSIS — C61 Malignant neoplasm of prostate: Secondary | ICD-10-CM

## 2015-06-21 ENCOUNTER — Ambulatory Visit (INDEPENDENT_AMBULATORY_CARE_PROVIDER_SITE_OTHER): Payer: Medicare Other | Admitting: Family Medicine

## 2015-06-21 VITALS — BP 130/64 | HR 63 | Temp 97.9°F | Resp 17 | Ht 73.0 in | Wt 181.8 lb

## 2015-06-21 DIAGNOSIS — M542 Cervicalgia: Secondary | ICD-10-CM

## 2015-06-21 DIAGNOSIS — M62838 Other muscle spasm: Secondary | ICD-10-CM

## 2015-06-21 DIAGNOSIS — M6248 Contracture of muscle, other site: Secondary | ICD-10-CM | POA: Diagnosis not present

## 2015-06-21 NOTE — Progress Notes (Addendum)
Subjective:  This chart was scribed for Merri Ray, MD by [name], Medical Scribe. This patient was seen in room 4 and the patient's care was started 11:55 AM.    Patient ID: Casey Fisher, male    DOB: 1928/09/18, 81 y.o.   MRN: [id number]  HPI Casey Fisher is a 81 y.o. male Here for shoulder pain and headache. Prior a patient of Dr. Elder Cyphers,  transfered to me with physical in April.  Hx of HTN, peripheral neuropathy for which he takes gabapentin 3 times daily.   Patient complains of pain in the right side of his neck and right shoulder that started 3 weeks ago. He woke up with pain in right side of neck and notes some trouble getting out of bed with some pain. He states that he's been exercising with weights for a while now. He notes the pain has gotten better. He denies taking medication for the pain. He denies rash and focal weakness.   He also mentions falling asleep during dinner - see last visit This has improved as he gets up afterwards to avoid these naps. He denies taking naps during the day.   He mentions that his brother had a stroke.    Patient Active Problem List   Diagnosis Date Noted  . Peripheral neuropathy 04/18/2015  . Hyperlipidemia 04/18/2015  . Prostate cancer 08/24/2014  . Right inguinal hernia 10/30/2013   Past Medical History  Diagnosis Date  . Cancer   . Hypertension   . Prostate cancer   . Neuropathy   . Depression   . Neuromuscular disorder   . Cataract   . Heart murmur   . Anxiety    Past Surgical History  Procedure Laterality Date  . Prostate surgery    . Eye surgery    . Hernia repair  1985  . Inguinal hernia repair Right 12/06/2013    Procedure: HERNIA REPAIR INGUINAL ADULT;  Surgeon: Merrie Roof, MD;  Location: [place];  Service: General;  Laterality: Right;  . Insertion of mesh Right 12/06/2013    Procedure: INSERTION OF MESH;  Surgeon: Merrie Roof, MD;  Location: [place];  Service: General;  Laterality: Right;    Allergies  Allergen Reactions  . Lyrica [Pregabalin] Other (See Comments)    "constipation"   Prior to Admission medications   Medication Sig Start Date End Date Taking? Authorizing Provider  aspirin EC 81 MG tablet Take 81 mg by mouth daily.   Yes Historical Provider, MD  Calcium Carb-Cholecalciferol (CALCIUM 600 + D PO) Take 1 tablet by mouth daily.   Yes Historical Provider, MD  gabapentin (NEURONTIN) 600 MG tablet Take 1 tablet (600 mg total) by mouth 3 (three) times daily as needed. 04/18/15  Yes [name], MD  hydrochlorothiazide (HYDRODIURIL) 25 MG tablet Take 1 tablet (25 mg total) by mouth daily. 04/18/15  Yes [name], MD  Leuprolide Acetate (LUPRON IJ) Inject 1 application as directed every 6 (six) months.   Yes Historical Provider, MD  simvastatin (ZOCOR) 40 MG tablet Take 1 tablet (40 mg total) by mouth daily. 04/18/15  Yes [name], MD   History   Social History  . Marital Status: Widowed    Spouse Name: N/A  . Number of Children: N/A  . Years of Education: N/A   Occupational History  . Not on file.   Social History Main Topics  . Smoking status: Former Research scientist (life sciences)  . Smokeless tobacco: Not  on file  . Alcohol Use: No  . Drug Use: No  . Sexual Activity: Not on file   Other Topics Concern  . Not on file   Social History Narrative        Review of Systems  Constitutional: Negative for fever.  Musculoskeletal: Positive for neck pain (right) and neck stiffness.  Skin: Negative for rash.  Neurological: Negative for weakness.       Objective:   Physical Exam  Constitutional: He is oriented to person, place, and time. He appears well-developed and well-nourished. No distress.  HENT:  Head: Normocephalic and atraumatic.  Eyes: EOM are normal. Pupils are equal, round, and reactive to light.  Neck: Neck supple.  Cardiovascular: Normal rate, regular rhythm and normal heart sounds.   Pulmonary/Chest: Effort normal and breath sounds normal.  No respiratory distress. He has no wheezes.  Musculoskeletal: Normal range of motion.  Equal grip strength  Cervical spine ROM decreased, mostly with guarded right lateral flexion, but also decreased flexion extension  Difficulty with obtaining reflexes in upper extremities but equal strength bilaterally  no weakness  Neurological: He is alert and oriented to person, place, and time.  Skin: Skin is warm and dry.  Psychiatric: He has a normal mood and affect. His behavior is normal.  Nursing note and vitals reviewed.   Filed Vitals:   06/21/15 1020  BP: 130/64  Pulse: 63  Temp: 97.9 F (36.6 C)  TempSrc: Oral  Resp: 17  Height: 6\' 1"  (1.854 m)  Weight: 181 lb 12.8 oz (82.464 kg)  SpO2: 98%      Assessment & Plan:   Casey Fisher is a 81 y.o. male Neck pain on right side  Muscle spasms of neck   No weakness and headache at occiput at area of neck pain and start of muscle spasm. nonfocal neuro exam, no apparent findings that would indicate CVA.  Suspected underlying DDD of neck, with neuronal impingement and secondary spasm. As improving - XR deferred today, can try 3rd dose of gabapentin as has not been taking this each night.  Tylenol as needed, warm heat to area and gentle stretches.  RTC precautions if persistent pain discussed.   No orders of the defined types were placed in this encounter.   Patient Instructions  As pain improving, we can hold off on xray today.  Third dose of gabapentin may help, but ok to take tylenol occasionally if needed, moist heat to sore area and gentle stretches. If pain not continuing to improve in next few weeks - I would recommend xray and possibly change medications. Return to the clinic or go to the nearest emergency room if any of your symptoms worsen or new symptoms occur.    I personally performed the services described in this documentation, which was scribed in my presence. The recorded information has been reviewed and  considered, and addended by me as needed.

## 2015-06-21 NOTE — Patient Instructions (Addendum)
As pain improving, we can hold off on xray today.  Third dose of gabapentin may help, but ok to take tylenol occasionally if needed, moist heat to sore area and gentle stretches. If pain not continuing to improve in next few weeks - I would recommend xray and possibly change medications. Return to the clinic or go to the nearest emergency room if any of your symptoms worsen or new symptoms occur.

## 2015-07-15 ENCOUNTER — Ambulatory Visit (INDEPENDENT_AMBULATORY_CARE_PROVIDER_SITE_OTHER): Payer: Medicare Other | Admitting: Family Medicine

## 2015-07-15 ENCOUNTER — Ambulatory Visit (INDEPENDENT_AMBULATORY_CARE_PROVIDER_SITE_OTHER): Payer: Medicare Other

## 2015-07-15 ENCOUNTER — Encounter: Payer: Self-pay | Admitting: Family Medicine

## 2015-07-15 VITALS — BP 136/58 | HR 64 | Temp 99.1°F | Resp 16 | Ht 73.0 in | Wt 187.0 lb

## 2015-07-15 DIAGNOSIS — G629 Polyneuropathy, unspecified: Secondary | ICD-10-CM

## 2015-07-15 DIAGNOSIS — Z23 Encounter for immunization: Secondary | ICD-10-CM

## 2015-07-15 DIAGNOSIS — R079 Chest pain, unspecified: Secondary | ICD-10-CM

## 2015-07-15 DIAGNOSIS — I10 Essential (primary) hypertension: Secondary | ICD-10-CM | POA: Diagnosis not present

## 2015-07-15 DIAGNOSIS — R9431 Abnormal electrocardiogram [ECG] [EKG]: Secondary | ICD-10-CM

## 2015-07-15 DIAGNOSIS — E785 Hyperlipidemia, unspecified: Secondary | ICD-10-CM | POA: Diagnosis not present

## 2015-07-15 NOTE — Progress Notes (Signed)
Subjective:    Patient ID: Casey Fisher, male    DOB: March 09, 1928, 81 y.o.   MRN: [id number] This chart was scribed for Merri Ray, MD by Zola Button, Medical Scribe. This patient was seen in Room 26 and the patient's care was started at 1:44 PM.    HPI HPI Comments: Casey Fisher is a 81 y.o. male with a history of prostate cancer, peripheral neuropathy, hypertension and hyperlipidemia who presents to the [facility] for a follow-up. Last seen by me on June 24th for acute visit for right-sided neck pain. Suspected DVD of his neck with neuronal impingement and secondary muscle spasm. He was improving at that time, but discussed possible third dose of gabapentin if needed.   Hypertension: Stable at [facility] visit April 21st. Continued on HCTZ 25 mg qd. He states his blood pressure has been stable at home. Lab Results  Component Value Date   CREATININE 0.85 04/18/2015   Hyperlipidemia: Controlled in April. Continued on Zocor 40 mg qd.  Lab Results  Component Value Date   CHOL 171 04/18/2015   HDL 66 04/18/2015   LDLCALC 95 04/18/2015   TRIG 51 04/18/2015   CHOLHDL 2.6 04/18/2015     Peripheral Neuropathy: He takes gabapentin 600 mg 2-3 times per day. Uses a cane for stability as needed. Handicap placard was provided in April. Did discuss some sleepiness at dinnertime which was improving at last visit as he gets up after dinner to lessen chance of falling asleep. Was not requiring naps at that visit. The neuropathy primarily affects his feet. He typically only uses his cane around the house. Overall, he has felt that the neuropathy has stayed about the same. He has been taking gabapentin 3 times a day.   Prostate Cancer: S/p seed implant in 2000, surgery in 2002. Takes Lupron. Followed by [facility], Dr. Junious Silk.   Chest Pain: Patient reports having some occasional left-sided chest pain on some mornings after waking up, less than 1 time per  week. The episodes are very brief. He does note sleeping on his left side sometimes. He has not had the pain with exertion, only after waking up. Patient denies SOB and diaphoresis. He also denies acid reflux. He did see a cardiologist about 9 years ago and did not find any blockages in his arteries. He does not remember the name of the cardiologist. He has had a catheterization about 9 years ago. Patient has been taking 81 mg aspirin daily since then.  Health Maintenance: Pneumonia vaccination, he was given Prevnar last visit. Will receive PCV 23 today. Patient has been exercising with light weights.  Patient Active Problem List   Diagnosis Date Noted  . Peripheral neuropathy 04/18/2015  . Hyperlipidemia 04/18/2015  . Prostate cancer 08/24/2014  . Right inguinal hernia 10/30/2013   Past Medical History  Diagnosis Date  . Cancer   . Hypertension   . Prostate cancer   . Neuropathy   . Depression   . Neuromuscular disorder   . Cataract   . Heart murmur   . Anxiety    Past Surgical History  Procedure Laterality Date  . Prostate surgery    . Eye surgery    . Hernia repair  1985  . Inguinal hernia repair Right 12/06/2013    Procedure: HERNIA REPAIR INGUINAL ADULT;  Surgeon: Merrie Roof, MD;  Location: [place];  Service: General;  Laterality: Right;  . Insertion of mesh Right 12/06/2013  Procedure: INSERTION OF MESH;  Surgeon: Merrie Roof, MD;  Location: [place];  Service: General;  Laterality: Right;   Allergies  Allergen Reactions  . Lyrica [Pregabalin] Other (See Comments)    "constipation"   Prior to Admission medications   Medication Sig Start Date End Date Taking? Authorizing Provider  aspirin EC 81 MG tablet Take 81 mg by mouth daily.   Yes Historical Provider, MD  Calcium Carb-Cholecalciferol (CALCIUM 600 + D PO) Take 1 tablet by mouth daily.   Yes Historical Provider, MD  gabapentin (NEURONTIN) 600 MG tablet Take 1 tablet (600 mg total) by mouth 3 (three) times  daily as needed. 04/18/15  Yes [name], MD  hydrochlorothiazide (HYDRODIURIL) 25 MG tablet Take 1 tablet (25 mg total) by mouth daily. 04/18/15  Yes [name], MD  Leuprolide Acetate (LUPRON IJ) Inject 1 application as directed every 6 (six) months.   Yes Historical Provider, MD  simvastatin (ZOCOR) 40 MG tablet Take 1 tablet (40 mg total) by mouth daily. 04/18/15  Yes [name], MD   History   Social History  . Marital Status: Widowed    Spouse Name: N/A  . Number of Children: N/A  . Years of Education: N/A   Occupational History  . Not on file.   Social History Main Topics  . Smoking status: Former Research scientist (life sciences)  . Smokeless tobacco: Not on file  . Alcohol Use: No  . Drug Use: No  . Sexual Activity: Not on file   Other Topics Concern  . Not on file   Social History Narrative     Review of Systems  Constitutional: Negative for diaphoresis.  Respiratory: Negative for shortness of breath.   Cardiovascular: Positive for chest pain.  Neurological: Positive for numbness.       Objective:   Physical Exam  Constitutional: He is oriented to person, place, and time. He appears well-developed and well-nourished.  HENT:  Head: Normocephalic and atraumatic.  Eyes: EOM are normal. Pupils are equal, round, and reactive to light.  Neck: No JVD present. Carotid bruit is not present.  Cardiovascular: Normal rate, regular rhythm and normal heart sounds.   No murmur heard. Pulmonary/Chest: Effort normal and breath sounds normal. He has no rales.  Musculoskeletal: He exhibits no edema.  Neurological: He is alert and oriented to person, place, and time.  Skin: Skin is warm and dry.  Psychiatric: He has a normal mood and affect.  Vitals reviewed.  EKG interpretation by Dr. Carlota Raspberry: Sinus bradycardia, rate of 55. Non-specific T-waves inferiorly and T-wave inversion V3 - V5. LAFB. Minimal change from December 2014.   [facility] (PRIMARY) x-ray report read by Dr. Carlota Raspberry: CXR -  increased markings vs. scar right lower lobe when compared to December 2014. No other acute findings.   Filed Vitals:   07/15/15 1317  BP: 136/58  Pulse: 64  Temp: 99.1 F (37.3 C)  TempSrc: Oral  Resp: 16  Height: 6\' 1"  (1.854 m)  Weight: 187 lb (84.823 kg)  SpO2: 96%       Assessment & Plan:  Casey Fisher is a 81 y.o. male Essential hypertension - Plan: Ambulatory referral to Cardiology  -Stable. No change in medicines at present.  Hyperlipidemia - Plan: Ambulatory referral to Cardiology  - Controlled in April. No changes. Continue Zocor 40 mg daily.   Peripheral neuropathy  -stable. Some improvement of symptoms with third dose of Neurontin, without new side effects.  Chest pain, unspecified chest pain  type - Plan: EKG 12-Lead, Ambulatory referral to Cardiology, DG Chest 2 View  -Rare episode and seems to be associated with laying on left side. Less likely cardiac in nature. He is asymptomatic in the office, but questionable abnormal EKG with minimal changes from his most recent one approximately 2 years ago. We'll have him evaluated by cardiology to determine if any further testing needed.  Continue aspirin 81 mg daily for now, RTC/ER precautions discussed.  Need for prophylactic vaccination against Streptococcus pneumoniae (pneumococcus) - Plan: Pneumococcal polysaccharide vaccine 23-valent greater than or equal to 2yo subcutaneous/IM   -Pneumovax given, status post Prevnar.  Should be up-to-date on pneumonia vaccination at this point.  Nonspecific abnormal electrocardiogram (ECG) (EKG) - Plan: Ambulatory referral to Cardiology  -As above will refer to cardiology for further eval, but doubt his chest pain symptoms were cardiac in nature.  No orders of the defined types were placed in this encounter.   Patient Instructions   Clear chest pain, I will refer you to cardiology to look into other testing needed. Continue aspirin once per day. If any increase in frequency of  chest pain or worsening of symptoms return here or emergency room.   No change in medications for now.  follow-up with me in 6 months.  Above patient instructions should state "regarding chest pain".  this plan was discussed with him while he was in office.  I personally performed the services described in this documentation, which was scribed in my presence. The recorded information has been reviewed and considered, and addended by me as needed.

## 2015-07-15 NOTE — Patient Instructions (Signed)
Clear chest pain, I will refer you to cardiology to look into other testing needed. Continue aspirin once per day. If any increase in frequency of chest pain or worsening of symptoms return here or emergency room.   No change in medications for now.  follow-up with me in 6 months.

## 2015-08-12 ENCOUNTER — Encounter (HOSPITAL_COMMUNITY): Payer: Self-pay

## 2015-08-12 ENCOUNTER — Ambulatory Visit (HOSPITAL_COMMUNITY): Payer: Medicare Other

## 2015-08-12 DIAGNOSIS — H43392 Other vitreous opacities, left eye: Secondary | ICD-10-CM | POA: Diagnosis not present

## 2015-08-12 DIAGNOSIS — Z961 Presence of intraocular lens: Secondary | ICD-10-CM | POA: Diagnosis not present

## 2015-08-13 ENCOUNTER — Ambulatory Visit (HOSPITAL_COMMUNITY)
Admission: RE | Admit: 2015-08-13 | Discharge: 2015-08-13 | Disposition: A | Discharge status: Home / Self Care | Payer: Self-pay | Source: Ambulatory Visit | Attending: Urology | Admitting: Urology

## 2015-08-13 ENCOUNTER — Encounter (HOSPITAL_COMMUNITY)
Admission: RE | Admit: 2015-08-13 | Discharge: 2015-08-13 | Disposition: A | Discharge status: Home / Self Care | Payer: Medicare Other | Source: Ambulatory Visit | Attending: Urology | Admitting: Urology

## 2015-08-13 DIAGNOSIS — C61 Malignant neoplasm of prostate: Secondary | ICD-10-CM | POA: Insufficient documentation

## 2015-08-13 MED ORDER — TECHNETIUM TC 99M MEDRONATE IV KIT
27.0000 | PACK | Freq: Once | INTRAVENOUS | Status: AC | PRN
  Administered 2015-08-13: 27 via INTRAVENOUS

## 2015-09-03 ENCOUNTER — Ambulatory Visit (INDEPENDENT_AMBULATORY_CARE_PROVIDER_SITE_OTHER): Payer: Medicare Other | Admitting: Interventional Cardiology

## 2015-09-03 ENCOUNTER — Encounter: Payer: Self-pay | Admitting: Interventional Cardiology

## 2015-09-03 VITALS — BP 136/64 | HR 66 | Ht 73.0 in | Wt 182.0 lb

## 2015-09-03 DIAGNOSIS — I10 Essential (primary) hypertension: Secondary | ICD-10-CM | POA: Diagnosis not present

## 2015-09-03 DIAGNOSIS — R079 Chest pain, unspecified: Secondary | ICD-10-CM

## 2015-09-03 DIAGNOSIS — E785 Hyperlipidemia, unspecified: Secondary | ICD-10-CM | POA: Diagnosis not present

## 2015-09-03 NOTE — Patient Instructions (Signed)
Medication Instructions:  Same-no changes  Labwork: None  Testing/Procedures: None  Follow-Up: Your physician recommends that you schedule a follow-up appointment in: as needed       

## 2015-09-03 NOTE — Progress Notes (Signed)
When necessary Patient ID: Casey Fisher, male   DOB: 06-16-1928, 81 y.o.   MRN: [id number]     Cardiology Office Note   Date:  09/03/2015   ID:  Casey Fisher, DOB 1928/08/29, MRN [id number]  PCP:  [name], MD    No chief complaint on file. chest pain   Wt Readings from Last 3 Encounters:  09/03/15 182 lb (82.555 kg)  07/15/15 187 lb (84.823 kg)  06/21/15 181 lb 12.8 oz (82.464 kg)       History of Present Illness: Casey Fisher is a 81 y.o. male  Who has had some mild chest pains over the past few weeks.  It is not related to exertion.  He walks around his house without problems.  It comes on at random times in the left side of his chest and resolves within a minute.  He cleans his own house.  He is widowed.  He has some grandchildren nearby.  He still drives.  He is concerned about his right foot and feeling the pedals.   He has had a cath per his report many years ago, perhaps 20 years ago.  No blockages or PCI at that time.    He has had a fall from a kitchen chair when he fell asleep.    No SHOB with walking.  He does some stretching exercises and has no chest pain or SHOB with that.  He states he can jog very short distances.  HE reports no problems with this.   He is careful to avoid falls.  He has foot problems which he describes as his biggest problems.  He uses a cane when he goes out.     Past Medical History  Diagnosis Date  . Cancer   . Hypertension   . Prostate cancer   . Neuropathy   . Depression   . Neuromuscular disorder   . Cataract   . Heart murmur   . Anxiety     Past Surgical History  Procedure Laterality Date  . Prostate surgery    . Eye surgery    . Hernia repair  1985  . Inguinal hernia repair Right 12/06/2013    Procedure: HERNIA REPAIR INGUINAL ADULT;  Surgeon: Merrie Roof, MD;  Location: [facility];  Service: General;  Laterality: Right;  . Insertion of mesh Right 12/06/2013    Procedure: INSERTION OF MESH;  Surgeon:  Merrie Roof, MD;  Location: [facility];  Service: General;  Laterality: Right;     Current Outpatient Prescriptions  Medication Sig Dispense Refill  . aspirin EC 81 MG tablet Take 81 mg by mouth daily.    . Calcium Carb-Cholecalciferol (CALCIUM 600 + D PO) Take 1 tablet by mouth daily.    Marland Kitchen gabapentin (NEURONTIN) 600 MG tablet Take 1 tablet (600 mg total) by mouth 3 (three) times daily as needed. 270 tablet 1  . hydrochlorothiazide (HYDRODIURIL) 25 MG tablet Take 1 tablet (25 mg total) by mouth daily. 90 tablet 1  . Leuprolide Acetate (LUPRON IJ) Inject 1 application as directed every 6 (six) months.    . simvastatin (ZOCOR) 40 MG tablet Take 1 tablet (40 mg total) by mouth daily. 90 tablet 1  . tamsulosin (FLOMAX) 0.4 MG CAPS capsule   1   No current facility-administered medications for this visit.    Allergies:   Lyrica    Social History:  The patient  reports that he has quit smoking. He does not have  any smokeless tobacco history on file. He reports that he does not drink alcohol or use illicit drugs.   Family History:  The patient's family history includes Brain cancer in his daughter; Heart disease in his father and mother; Stroke in his mother.    ROS:  Please see the history of present illness.   Otherwise, review of systems are positive for chest pain.   All other systems are reviewed and negative.    PHYSICAL EXAM: VS:  BP 136/64 mmHg  Pulse 66  Ht 6\' 1"  (1.854 m)  Wt 182 lb (82.555 kg)  BMI 24.02 kg/m2  SpO2 97% , BMI Body mass index is 24.02 kg/(m^2). GEN: Well nourished, well developed, in no acute distress HEENT: normal Neck: no JVD, carotid bruits, or masses Cardiac: RRR; no murmurs, rubs, or gallops,no edema  Respiratory:  clear to auscultation bilaterally, normal work of breathing GI: soft, nontender, nondistended, + BS MS: no deformity or atrophy Skin: warm and dry, no rash Neuro:  Strength and sensation are intact Psych: euthymic mood, full affect;  repeats himself a lot which is concerning for some dementia   EKG:   The ekg ordered in 7/16 demonstrates NSR, anterior T wave changes ECG from today shows NSR, resolution of prior anterior T wave changes   Recent Labs: 04/18/2015: ALT 10; BUN 15; Creat 0.85; Potassium 3.4*; Sodium 141   Lipid Panel    Component Value Date/Time   CHOL 171 04/18/2015 1451   TRIG 51 04/18/2015 1451   HDL 66 04/18/2015 1451   CHOLHDL 2.6 04/18/2015 1451   VLDL 10 04/18/2015 1451   LDLCALC 95 04/18/2015 1451     Other studies Reviewed: Additional studies/ records that were reviewed today with results demonstrating: Old ECG was reviewed. There were changes from the July 2016 ECG compared to December 2014 ECG.   ASSESSMENT AND PLAN:  1. Chest discomfort: Atypical. No cardiac features. I think is more likely musculoskeletal. Repeat ECG shows resolution of the changes that had occurred in July 16.  Would not pursue any ischemia testing at this time. In addition, I think his memory is somewhat impaired. His primary care doctor can follow-up to see if any dementia is present 2. Hyperlipidemia/hypertension: These are his risk factors for CAD. Well controlled at this point by primary care.   Current medicines are reviewed at length with the patient today.  The patient concerns regarding his medicines were addressed.  The following changes have been made:  No change  Labs/ tests ordered today include:   Orders Placed This Encounter  Procedures  . EKG 12-Lead    Recommend 150 minutes/week of aerobic exercise Low fat, low carb, high fiber diet recommended  Disposition:   FU in prn   Teresita Madura., MD  09/03/2015 1:16 PM    [facility] [address], [place], [place]  [address] Phone: [phone]; Fax: [phone]

## 2015-09-17 ENCOUNTER — Other Ambulatory Visit: Payer: Self-pay | Admitting: Urology

## 2015-09-17 DIAGNOSIS — C61 Malignant neoplasm of prostate: Secondary | ICD-10-CM

## 2015-11-20 DIAGNOSIS — N35011 Post-traumatic bulbous urethral stricture: Secondary | ICD-10-CM | POA: Diagnosis not present

## 2015-11-20 DIAGNOSIS — C61 Malignant neoplasm of prostate: Secondary | ICD-10-CM | POA: Diagnosis not present

## 2015-11-25 ENCOUNTER — Other Ambulatory Visit: Payer: Self-pay | Admitting: Family Medicine

## 2015-12-02 ENCOUNTER — Ambulatory Visit (HOSPITAL_COMMUNITY)
Admission: RE | Admit: 2015-12-02 | Discharge: 2015-12-02 | Disposition: A | Discharge status: Home / Self Care | Payer: Self-pay | Source: Ambulatory Visit | Attending: Urology | Admitting: Urology

## 2015-12-02 ENCOUNTER — Encounter (HOSPITAL_COMMUNITY)
Admission: RE | Admit: 2015-12-02 | Discharge: 2015-12-02 | Disposition: A | Discharge status: Home / Self Care | Payer: Self-pay | Source: Ambulatory Visit | Attending: Urology | Admitting: Urology

## 2015-12-02 DIAGNOSIS — C61 Malignant neoplasm of prostate: Secondary | ICD-10-CM | POA: Insufficient documentation

## 2015-12-02 MED ORDER — SODIUM PERTECHNETATE TC 99M INJECTION
27.5000 | Freq: Once | INTRAVENOUS | Status: AC | PRN
Start: 2015-12-02 — End: 2015-12-02
  Administered 2015-12-02: 28 via INTRAVENOUS

## 2015-12-04 ENCOUNTER — Ambulatory Visit (INDEPENDENT_AMBULATORY_CARE_PROVIDER_SITE_OTHER): Payer: Medicare Other | Admitting: Radiology

## 2015-12-04 DIAGNOSIS — Z23 Encounter for immunization: Secondary | ICD-10-CM

## 2015-12-12 ENCOUNTER — Other Ambulatory Visit: Payer: Self-pay | Admitting: Urology

## 2015-12-12 DIAGNOSIS — C61 Malignant neoplasm of prostate: Secondary | ICD-10-CM

## 2016-01-04 ENCOUNTER — Other Ambulatory Visit: Payer: Self-pay | Admitting: Family Medicine

## 2016-02-02 ENCOUNTER — Other Ambulatory Visit: Payer: Self-pay | Admitting: Family Medicine

## 2016-02-19 ENCOUNTER — Other Ambulatory Visit: Payer: Self-pay | Admitting: Family Medicine

## 2016-02-20 NOTE — Telephone Encounter (Signed)
Called pt because he is overdue for f/up. He had not been aware but glad to come in for check up. Scheduled appt for 3/15 and I will send in 1 mos RF to cover until then.

## 2016-03-11 ENCOUNTER — Encounter: Payer: Self-pay | Admitting: Family Medicine

## 2016-03-11 ENCOUNTER — Ambulatory Visit (INDEPENDENT_AMBULATORY_CARE_PROVIDER_SITE_OTHER): Payer: Medicare Other | Admitting: Family Medicine

## 2016-03-11 VITALS — BP 132/86 | HR 85 | Temp 98.4°F | Resp 16 | Ht 73.0 in | Wt 177.4 lb

## 2016-03-11 DIAGNOSIS — I10 Essential (primary) hypertension: Secondary | ICD-10-CM | POA: Diagnosis not present

## 2016-03-11 DIAGNOSIS — G6289 Other specified polyneuropathies: Secondary | ICD-10-CM

## 2016-03-11 DIAGNOSIS — E785 Hyperlipidemia, unspecified: Secondary | ICD-10-CM

## 2016-03-11 DIAGNOSIS — L609 Nail disorder, unspecified: Secondary | ICD-10-CM

## 2016-03-11 DIAGNOSIS — R413 Other amnesia: Secondary | ICD-10-CM | POA: Diagnosis not present

## 2016-03-11 DIAGNOSIS — L602 Onychogryphosis: Secondary | ICD-10-CM

## 2016-03-11 LAB — COMPLETE METABOLIC PANEL WITH GFR
ALBUMIN: 4.5 g/dL (ref 3.6–5.1)
ALK PHOS: 64 U/L (ref 40–115)
ALT: 12 U/L (ref 9–46)
AST: 20 U/L (ref 10–35)
BILIRUBIN TOTAL: 0.5 mg/dL (ref 0.2–1.2)
BUN: 19 mg/dL (ref 7–25)
CALCIUM: 9.6 mg/dL (ref 8.6–10.3)
CO2: 32 mmol/L — ABNORMAL HIGH (ref 20–31)
CREATININE: 0.95 mg/dL (ref 0.70–1.11)
Chloride: 99 mmol/L (ref 98–110)
GFR, Est African American: 83 mL/min (ref >=60)
GFR, Est Non African American: 72 mL/min (ref >=60)
Glucose, Bld: 97 mg/dL (ref 65–99)
Potassium: 3.4 mmol/L — ABNORMAL LOW (ref 3.5–5.3)
Sodium: 140 mmol/L (ref 135–146)
TOTAL PROTEIN: 7.7 g/dL (ref 6.1–8.1)

## 2016-03-11 LAB — LIPID PANEL
CHOLESTEROL: 159 mg/dL (ref 125–200)
HDL: 61 mg/dL (ref >=40)
LDL Cholesterol: 84 mg/dL (ref <130)
Total CHOL/HDL Ratio: 2.6 Ratio (ref <=5.0)
Triglycerides: 68 mg/dL (ref <150)
VLDL: 14 mg/dL (ref <30)

## 2016-03-11 MED ORDER — GABAPENTIN 600 MG PO TABS: 600.0000 mg | ORAL_TABLET | Freq: Three times a day (TID) | ORAL | Status: DC

## 2016-03-11 MED ORDER — SIMVASTATIN 40 MG PO TABS: 40.0000 mg | ORAL_TABLET | Freq: Every day | ORAL | Status: DC

## 2016-03-11 MED ORDER — HYDROCHLOROTHIAZIDE 25 MG PO TABS: 25.0000 mg | ORAL_TABLET | Freq: Every day | ORAL | Status: DC

## 2016-03-11 NOTE — Patient Instructions (Addendum)
Restart your neurontin - initially 600mg  pill once per day for 1 day, then twice per day on 2nd day, then 3 times per day on 3rd day and forward. Can stay at this dose until seen by neurologist, but you may need to be on higher doses to help with your foot burning/numbness. Stop the herbal medicine until we know what is in it as it could cause harm with your other medicines or medical problems.   I will also have the neurologist check memory to make sure that is doing ok.   Return to the clinic or go to the nearest emergency room if any of your symptoms worsen or new symptoms occur.  Will refer you to other foot specialist for nail care.   You should receive a call or letter about your lab results within the next week to 10 days.    IF you received an x-ray today, you will receive an invoice from [facility]. Please contact [facility] at [phone] with questions or concerns regarding your invoice.   IF you received labwork today, you will receive an invoice from [employer]. Please contact [facility] at [phone] with questions or concerns regarding your invoice.   Our billing staff will not be able to assist you with questions regarding bills from these companies.  You will be contacted with the lab results as soon as they are available. The fastest way to get your results is to activate your My Chart account. Instructions are located on the last page of this paperwork. If you have not heard from [place] regarding the results in 2 weeks, please contact this office.

## 2016-03-11 NOTE — Progress Notes (Signed)
Subjective:  By signing my name below, I, Raven Small, attest that this documentation has been prepared under the direction and in the presence of Merri Ray, MD.  Electronically Signed: Thea Alken, ED Scribe. 03/11/2016. 1:02 PM.   Patient ID: Casey Fisher, male    DOB: [birth year], 82 y.o.   MRN: [id number]  HPI Chief Complaint  Patient presents with  . Medication Refill    HCTZ 25 mg  . pt will see the Urologist for appt on 03/12/16   HPI Comments: Casey Fisher is a 82 y.o. male who presents to the [facility] for a medication refill.    Hypertension Last visit with me 06/2014. Stable at that visit. Continued on HCTZ 25mg  qd. Cardiologist visit 9/6 for chest discomfort thought to be due to musculoskeletal cause.   Hyperlipidemia On zocor 40 mg qd   Lab Results  Component Value Date   CHOL 171 04/18/2015   HDL 66 04/18/2015   LDLCALC 95 04/18/2015   TRIG 51 04/18/2015   CHOLHDL 2.6 04/18/2015   Lab Results  Component Value Date   ALT 10 04/18/2015   AST 17 04/18/2015   ALKPHOS 62 04/18/2015   BILITOT 0.7 04/18/2015   hyperkalemia  Border line on 03/2015. Recheck in 6 week. Has not had recent blood work.   Peripheral neuropathy Taking gabapentin 600 mg 2-3 times a day. Uses a cane for stability as needed.  Stable symptoms at that time with 2-3 dosing.  Pt still has persistent burning pain at the base of both feet bilaterally. He decided himself to stop taking gabapentin, feeling as if medication was not helping with pain. He states since stopping medication, he does not feel a difference in foot pain.  Pt states he recently started taking an OTC medication for nerves. He states he was seen at [facility] 3 times and that at 2 of those visits he received an injection of cortisone to treat his neuropathy. He was also prescribed a hinged lower ankle/foot orthosis with ridged base. He also reports gradual worsening numbness to both  feet.  He also states he was seen by a chiropractor and a laser specialist.  I called Dr. Lindley Magnus office. Pt was give a neuroma injection on 3/16 and was given a brace for posterior tib dysfunction. He has not had any recent visit with them.  Prostate cancer. Seed implant in 2000 and surgery in 2002. He is seen at [facility] urology and is followed Dr Junious Silk. Takes Lupron. He has an appointment with Urology tomorrow.  Memory  Concern at cardiology for possible dementia. Pt states his memory has been doing well. He feels like he does not forget too much. Pt lives at home by himself. He states he handles his bills and utilities himself.  Pt reports occasional constipation. He states he has been drinking plenty of water.  Patient Active Problem List   Diagnosis Date Noted  . Peripheral neuropathy ([place]) 04/18/2015  . Hyperlipidemia 04/18/2015  . Prostate cancer ([place]) 08/24/2014  . Right inguinal hernia 10/30/2013   Past Medical History  Diagnosis Date  . Cancer   . Hypertension   . Prostate cancer   . Neuropathy   . Depression   . Neuromuscular disorder   . Cataract   . Heart murmur   . Anxiety    Past Surgical History  Procedure Laterality Date  . Prostate surgery    . Eye surgery    .  Hernia repair  1985  . Inguinal hernia repair Right 12/06/2013    Procedure: HERNIA REPAIR INGUINAL ADULT;  Surgeon: Merrie Roof, MD;  Location: [facility];  Service: General;  Laterality: Right;  . Insertion of mesh Right 12/06/2013    Procedure: INSERTION OF MESH;  Surgeon: Merrie Roof, MD;  Location: [facility];  Service: General;  Laterality: Right;   Allergies  Allergen Reactions  . Lyrica [Pregabalin] Other (See Comments)    "constipation"   Prior to Admission medications   Medication Sig Start Date End Date Taking? Authorizing Provider  aspirin EC 81 MG tablet Take 81 mg by mouth daily.    Historical Provider, MD  Calcium Carb-Cholecalciferol (CALCIUM 600 + D PO) Take 1 tablet by mouth  daily.    Historical Provider, MD  gabapentin (NEURONTIN) 600 MG tablet Take 1 tablet (600 mg total) by mouth 3 (three) times daily as needed. 04/18/15   [name], MD  hydrochlorothiazide (HYDRODIURIL) 25 MG tablet take 1 tablet by mouth once daily 02/20/16   [name], MD  Leuprolide Acetate (LUPRON IJ) Inject 1 application as directed every 6 (six) months.    Historical Provider, MD  simvastatin (ZOCOR) 40 MG tablet Take 1 tablet (40 mg total) by mouth daily. 04/18/15   [name], MD  tamsulosin [facility]) 0.4 MG CAPS capsule  08/17/15   Historical Provider, MD   Social History   Social History  . Marital Status: Widowed    Spouse Name: N/A  . Number of Children: N/A  . Years of Education: N/A   Occupational History  . Not on file.   Social History Main Topics  . Smoking status: Former Research scientist (life sciences)  . Smokeless tobacco: Not on file  . Alcohol Use: No  . Drug Use: No  . Sexual Activity: Not on file   Other Topics Concern  . Not on file   Social History Narrative   Review of Systems  Constitutional: Negative for fatigue and unexpected weight change.  Eyes: Negative for visual disturbance.  Respiratory: Negative for cough, chest tightness and shortness of breath.   Cardiovascular: Negative for chest pain, palpitations and leg swelling.  Gastrointestinal: Negative for abdominal pain and blood in stool.  Neurological: Negative for dizziness, light-headedness and headaches.    Objective:   Physical Exam  Constitutional: He is oriented to person, place, and time. He appears well-developed and well-nourished. No distress.  HENT:  Head: Normocephalic and atraumatic.  Eyes: Conjunctivae and EOM are normal.  Neck: Neck supple.  Cardiovascular: Normal rate, regular rhythm and normal heart sounds.   No murmur heard. Pulses:      Dorsalis pedis pulses are 2+ on the right side, and 2+ on the left side.  Pulmonary/Chest: Effort normal and breath sounds normal. He has no  wheezes. He has no rales.  Musculoskeletal: Normal range of motion. He exhibits no edema.  collapsed arches. No LE edema.   Neurological: He is alert and oriented to person, place, and time.  No feeling with microfilament testing across base of feet. Alert and oriented to place and time.  Skin: Skin is warm and dry.  Skin intact. Cap refill < 2 sec. Pt toenails are long and curved over and around toe. He request to be seen by foot specialist.  Psychiatric: He has a normal mood and affect. His behavior is normal.  Nursing note and vitals reviewed.  Filed Vitals:   03/11/16 1305  BP: 132/86  Pulse:  85  Temp: 98.4 F (36.9 C)  TempSrc: Oral  Resp: 16  Height: 6\' 1"  (1.854 m)  Weight: 177 lb 6.4 oz (80.468 kg)  SpO2: 96%    Assessment & Plan:   Casey Fisher is a 82 y.o. male Essential hypertension - Plan: COMPLETE METABOLIC PANEL WITH GFR, hydrochlorothiazide (HYDRODIURIL) 25 MG tablet  -Controlled we'll continue same dose of HCTZ, CMP pending  Hyperlipidemia - Plan: COMPLETE METABOLIC PANEL WITH GFR, Lipid panel, simvastatin (ZOCOR) 40 MG tablet  -Check lipid panel, continue Zocor 40 mg daily.  Other polyneuropathy ([place]) - Plan: Ambulatory referral to Neurology, gabapentin (NEURONTIN) 600 MG tablet, Ambulatory referral to Podiatry  -History of peripheral neuropathy, but self discontinued Neurontin. There was some confusion as to other treatment that he's been using over-the-counter, and evaluation with chiropractor and podiatry. Did call foot specialist who he saw, but that was much earlier last year, had not seen him recently, and apparently had treatment for Morton's neuroma, not peripheral neuropathy.   -Restart Neurontin as below. Referral to neuro. Referral to podiatry for foot care, with thickened curved nails.  Memory disturbance - Plan: Ambulatory referral to Neurology  -There was some concern from his cardiologist, and appears to be some discrepancy in history form  his foot treatment. Other times during visit he seems to be clear about the history and his symptoms. We'll have him evaluated by neurology for the peripheral neuropathy and memory testing as well. Oriented to person place and time here in the office.  Long toenail - Plan: Ambulatory referral to Podiatry  -As above referral to podiatry. History of pes planus, and thickened toenails for which he is having difficulty cutting. Eval and treat  Meds ordered this encounter  Medications  . gabapentin (NEURONTIN) 600 MG tablet    Sig: Take 1 tablet (600 mg total) by mouth 3 (three) times daily. Start 1po qd for 1 day, then 1 po BID for 1 day, then 1po tid.    Dispense:  270 tablet    Refill:  0  . hydrochlorothiazide (HYDRODIURIL) 25 MG tablet    Sig: Take 1 tablet (25 mg total) by mouth daily.    Dispense:  90 tablet    Refill:  1  . simvastatin (ZOCOR) 40 MG tablet    Sig: Take 1 tablet (40 mg total) by mouth daily.    Dispense:  90 tablet    Refill:  1   Patient Instructions  Restart your neurontin - initially 600mg  pill once per day for 1 day, then twice per day on 2nd day, then 3 times per day on 3rd day and forward. Can stay at this dose until seen by neurologist, but you may need to be on higher doses to help with your foot burning/numbness. Stop the herbal medicine until we know what is in it as it could cause harm with your other medicines or medical problems.   I will also have the neurologist check memory to make sure that is doing ok.   Return to the clinic or go to the nearest emergency room if any of your symptoms worsen or new symptoms occur.  Will refer you to other foot specialist for nail care.   You should receive a call or letter about your lab results within the next week to 10 days.    IF you received an x-ray today, you will receive an invoice from [facility]. Please contact [facility] at [phone] with questions or concerns regarding your  invoice.   IF you received labwork today, you will receive an invoice from [employer]. Please contact [facility] at [phone] with questions or concerns regarding your invoice.   Our billing staff will not be able to assist you with questions regarding bills from these companies.  You will be contacted with the lab results as soon as they are available. The fastest way to get your results is to activate your My Chart account. Instructions are located on the last page of this paperwork. If you have not heard from [place] regarding the results in 2 weeks, please contact this office.     I personally performed the services described in this documentation, which was scribed in my presence. The recorded information has been reviewed and considered, and addended by me as needed.

## 2016-03-20 DIAGNOSIS — C61 Malignant neoplasm of prostate: Secondary | ICD-10-CM | POA: Diagnosis not present

## 2016-03-23 ENCOUNTER — Ambulatory Visit (HOSPITAL_COMMUNITY)
Admission: RE | Admit: 2016-03-23 | Discharge: 2016-03-23 | Disposition: A | Discharge status: Home / Self Care | Payer: Medicare Other | Source: Ambulatory Visit | Attending: Urology | Admitting: Urology

## 2016-03-23 ENCOUNTER — Encounter (HOSPITAL_COMMUNITY)
Admission: RE | Admit: 2016-03-23 | Discharge: 2016-03-23 | Disposition: A | Discharge status: Home / Self Care | Payer: Medicare Other | Source: Ambulatory Visit | Attending: Urology | Admitting: Urology

## 2016-03-23 DIAGNOSIS — C61 Malignant neoplasm of prostate: Secondary | ICD-10-CM

## 2016-03-23 MED ORDER — TECHNETIUM TC 99M MEDRONATE IV KIT
23.7000 | PACK | Freq: Once | INTRAVENOUS | Status: AC | PRN
  Administered 2016-03-23: 23.7 via INTRAVENOUS

## 2016-03-24 ENCOUNTER — Encounter: Payer: Self-pay | Admitting: Neurology

## 2016-03-24 ENCOUNTER — Ambulatory Visit (INDEPENDENT_AMBULATORY_CARE_PROVIDER_SITE_OTHER): Payer: Medicare Other | Admitting: Neurology

## 2016-03-24 VITALS — BP 120/59 | HR 77 | Ht 73.0 in | Wt 181.5 lb

## 2016-03-24 DIAGNOSIS — G609 Hereditary and idiopathic neuropathy, unspecified: Secondary | ICD-10-CM | POA: Diagnosis not present

## 2016-03-24 DIAGNOSIS — E538 Deficiency of other specified B group vitamins: Secondary | ICD-10-CM | POA: Diagnosis not present

## 2016-03-24 DIAGNOSIS — R269 Unspecified abnormalities of gait and mobility: Secondary | ICD-10-CM

## 2016-03-24 DIAGNOSIS — R413 Other amnesia: Secondary | ICD-10-CM | POA: Diagnosis not present

## 2016-03-24 HISTORY — DX: Unspecified abnormalities of gait and mobility: R26.9

## 2016-03-24 HISTORY — DX: Other amnesia: R41.3

## 2016-03-24 HISTORY — DX: Hereditary and idiopathic neuropathy, unspecified: G60.9

## 2016-03-24 NOTE — Patient Instructions (Signed)

## 2016-03-24 NOTE — Progress Notes (Signed)
Reason for visit: Memory disturbance  Referring physician: Dr. [name] is a 82 y.o. male  History of present illness:  Mr. Schrack is an 82 year old right-handed white male with a history of a peripheral neuropathy that was originally diagnosed through this office in January 2008, documented by nerve conduction study. The patient has had ongoing issues with numbness, some discomfort and burning in the feet. The patient is on gabapentin taking 600 mg 3 times daily. Indicates that he does believe that this helps some. He does have some swelling in the legs. He does report some mild gait instability, he will use a cane with ambulation outside the house. He denies any falls. The patient reports some mild forgetfulness, particularly with short-term memory. He lives alone, and he operates a Teacher, music without difficulty. The patient manages his own finances, and keeps up with his own medications and appointments. He will frequently go into rooms and not remember why he went in there. He may misplace things about the house. He was seen recently by his primary care doctor, and sent to this office for further evaluation.  Past Medical History  Diagnosis Date  . Cancer ([place])   . Hypertension   . Prostate cancer ([place])     remission  . Neuropathy ([place])   . Depression   . Neuromuscular disorder ([place])   . Cataract   . Heart murmur   . Anxiety   . Memory difficulty 03/24/2016  . Hereditary and idiopathic peripheral neuropathy 03/24/2016  . Abnormality of gait 03/24/2016    Past Surgical History  Procedure Laterality Date  . Prostate surgery    . Eye surgery    . Hernia repair  1985  . Inguinal hernia repair Right 12/06/2013    Procedure: HERNIA REPAIR INGUINAL ADULT;  Surgeon: Merrie Roof, MD;  Location: [facility];  Service: General;  Laterality: Right;  . Insertion of mesh Right 12/06/2013    Procedure: INSERTION OF MESH;  Surgeon: Merrie Roof, MD;  Location: [facility];  Service: General;  Laterality: Right;  . Cataract extraction Bilateral     Family History  Problem Relation Age of Onset  . Brain cancer Daughter   . Heart disease Mother   . Stroke Mother   . Heart disease Father     Social history:  reports that he has quit smoking. He does not have any smokeless tobacco history on file. He reports that he does not drink alcohol or use illicit drugs.  Medications:  Prior to Admission medications   Medication Sig Start Date End Date Taking? Authorizing Provider  aspirin EC 81 MG tablet Take 81 mg by mouth daily.   Yes Historical Provider, MD  Calcium Carb-Cholecalciferol (CALCIUM 600 + D PO) Take 1 tablet by mouth daily.   Yes Historical Provider, MD  gabapentin (NEURONTIN) 600 MG tablet Take 1 tablet (600 mg total) by mouth 3 (three) times daily. Start 1po qd for 1 day, then 1 po BID for 1 day, then 1po tid. 03/11/16  Yes [name], MD  hydrochlorothiazide (HYDRODIURIL) 25 MG tablet Take 1 tablet (25 mg total) by mouth daily. 03/11/16  Yes [name], MD  Leuprolide Acetate (LUPRON IJ) Inject 1 application as directed every 6 (six) months.   Yes Historical Provider, MD  simvastatin (ZOCOR) 40 MG tablet Take 1 tablet (40 mg total) by mouth daily. 03/11/16  Yes [name], MD  tamsulosin (FLOMAX) 0.4 MG  CAPS capsule Reported on 03/11/2016 08/17/15   Historical Provider, MD      Allergies  Allergen Reactions  . Lyrica [Pregabalin] Other (See Comments)    "constipation"    ROS:  Out of a complete 14 system review of symptoms, the patient complains only of the following symptoms, and all other reviewed systems are negative.  Hearing loss Numbness in the feet Restless legs  Blood pressure 120/59, pulse 77, height 6\' 1"  (1.854 m), weight 181 lb 8 oz (82.328 kg).  Physical Exam  General: The patient is alert and cooperative at the time of the examination.  Eyes: Pupils are equal, round, and reactive to light. Discs are flat  bilaterally.  Neck: The neck is supple, no carotid bruits are noted.  Respiratory: The respiratory examination is clear.  Cardiovascular: The cardiovascular examination reveals a regular rate and rhythm, no obvious murmurs or rubs are noted.  Skin: Extremities are with 2+ edema below the knees.  Neurologic Exam  Mental status: The patient is alert and oriented x 3 at the time of the examination. The Mini-Mental Status Examination done today shows a total score 23/30. The patient is able to name 9 animals in 30 seconds.  Cranial nerves: Facial symmetry is present. There is good sensation of the face to pinprick and soft touch bilaterally. The strength of the facial muscles and the muscles to head turning and shoulder shrug are normal bilaterally. Speech is well enunciated, no aphasia or dysarthria is noted. Extraocular movements are full. Visual fields are full. The tongue is midline, and the patient has symmetric elevation of the soft palate. No obvious hearing deficits are noted.  Motor: The motor testing reveals 5 over 5 strength of all 4 extremities. Good symmetric motor tone is noted throughout.  Sensory: Sensory testing is intact to pinprick, soft touch, vibration sensation, and position sense on the upper extremities. With the lower extremities, no definite stocking pattern pinprick sensory deficit is seen on either side. Patient has some depression of vibration and position sense on the right foot, not on the left. No evidence of extinction is noted.  Coordination: Cerebellar testing reveals good finger-nose-finger and heel-to-shin bilaterally.  Gait and station: Gait is stooped, slow, slightly wide-based. The patient normally uses a cane for ambulation. Tandem gait is very unsteady. Romberg is negative. No drift is seen.  Reflexes: Deep tendon reflexes are symmetric, but are depressed bilaterally. Toes are downgoing bilaterally.   Assessment/Plan:  1. Memory disturbance  2.  Peripheral neuropathy  3. Gait disturbance  The patient likely has a very mild memory disturbance, and he lives alone and manages his own affairs at this point. The patient will be set up for blood work today, and a CT scan of the head. The patient has a well-documented peripheral neuropathy, he does have some gait instability issues, but remained relatively safe at this point. The gabapentin seems to be helpful. The patient will follow-up in 5 or 6 months, we will follow the memory problems over time.  Jill Alexanders MD 03/24/2016 7:28 PM  [facility] [address] [place], [place] [address]  Phone [phone] Fax [phone]

## 2016-03-25 ENCOUNTER — Other Ambulatory Visit: Payer: Self-pay | Admitting: Urology

## 2016-03-25 DIAGNOSIS — C61 Malignant neoplasm of prostate: Secondary | ICD-10-CM

## 2016-03-27 LAB — MULTIPLE MYELOMA PANEL, SERUM
ALBUMIN/GLOB SERPL: 1.1 (ref 0.7–1.7)
Albumin SerPl Elph-Mcnc: 3.5 g/dL (ref 2.9–4.4)
Alpha 1: 0.2 g/dL (ref 0.0–0.4)
Alpha2 Glob SerPl Elph-Mcnc: 0.7 g/dL (ref 0.4–1.0)
B-Globulin SerPl Elph-Mcnc: 1.2 g/dL (ref 0.7–1.3)
GAMMA GLOB SERPL ELPH-MCNC: 1.4 g/dL (ref 0.4–1.8)
GLOBULIN, TOTAL: 3.5 g/dL (ref 2.2–3.9)
IGA/IMMUNOGLOBULIN A, SERUM: 325 mg/dL (ref 61–437)
IgG (Immunoglobin G), Serum: 1471 mg/dL (ref 700–1600)
IgM (Immunoglobulin M), Srm: 28 mg/dL (ref 15–143)
Total Protein: 7 g/dL (ref 6.0–8.5)

## 2016-03-27 LAB — B. BURGDORFI ANTIBODIES: Lyme IgG/IgM Ab: <0.91 {ISR} (ref 0.00–0.90)

## 2016-03-27 LAB — RPR: RPR Ser Ql: NONREACTIVE

## 2016-03-27 LAB — VITAMIN B12: VITAMIN B 12: 395 pg/mL (ref 211–946)

## 2016-03-27 LAB — ANA W/REFLEX: ANA: NEGATIVE

## 2016-03-30 ENCOUNTER — Telehealth: Payer: Self-pay | Admitting: *Deleted

## 2016-03-30 NOTE — Telephone Encounter (Signed)
-----   Message from Kathrynn Ducking, MD sent at 03/27/2016  2:08 PM EDT -----  The blood work results are unremarkable. Please call the patient.  ----- Message -----    From: [facility] Lab Results In Interface    Sent: 03/25/2016   7:43 AM      To: Kathrynn Ducking, MD

## 2016-03-30 NOTE — Telephone Encounter (Signed)
Spoke to pt and relayed the results of labs.  He verbalized understanding.

## 2016-04-02 ENCOUNTER — Ambulatory Visit
Admission: RE | Admit: 2016-04-02 | Discharge: 2016-04-02 | Disposition: A | Discharge status: Home / Self Care | Payer: Medicare Other | Source: Ambulatory Visit | Attending: Neurology | Admitting: Neurology

## 2016-04-02 DIAGNOSIS — R413 Other amnesia: Secondary | ICD-10-CM | POA: Diagnosis not present

## 2016-04-02 DIAGNOSIS — R269 Unspecified abnormalities of gait and mobility: Secondary | ICD-10-CM | POA: Diagnosis not present

## 2016-04-03 ENCOUNTER — Telehealth: Payer: Self-pay | Admitting: Neurology

## 2016-04-03 NOTE — Telephone Encounter (Signed)
I called patient. The CT the head shows significant white matter, small vessel disease. No significant change from 2 years ago. The patient does have some gait instability issues that may also be associated with this. If he desires to have medications for memory, he will contact our office. Blood work was also unremarkable.   CT head 04/03/16:  IMPRESSION: This CT scan of the head without contrast shows the following: 1. Mild generalized cortical atrophy, unchanged when compared to the 01/16/2014 CT scan.  2. Extensive chronic microvascular ischemic changes in both hemispheres. There are large component fluencies adjacent to the frontal horns and posterior horns. The extent is more than expected for age but unchanged when compared to 01/16/2014 CT scan.

## 2016-04-07 ENCOUNTER — Ambulatory Visit (INDEPENDENT_AMBULATORY_CARE_PROVIDER_SITE_OTHER): Payer: Medicare Other | Admitting: Sports Medicine

## 2016-04-07 ENCOUNTER — Encounter: Payer: Self-pay | Admitting: Sports Medicine

## 2016-04-07 VITALS — BP 117/78 | HR 65 | Resp 16

## 2016-04-07 DIAGNOSIS — G629 Polyneuropathy, unspecified: Secondary | ICD-10-CM | POA: Diagnosis not present

## 2016-04-07 DIAGNOSIS — M79672 Pain in left foot: Secondary | ICD-10-CM | POA: Diagnosis not present

## 2016-04-07 DIAGNOSIS — B351 Tinea unguium: Secondary | ICD-10-CM | POA: Diagnosis not present

## 2016-04-07 DIAGNOSIS — M79671 Pain in right foot: Secondary | ICD-10-CM

## 2016-04-07 NOTE — Progress Notes (Deleted)
   Subjective:    Patient ID: Casey Fisher, male    DOB: 1928-08-26, 82 y.o.   MRN: [id number]  HPI    Review of Systems  HENT: Positive for hearing loss.   Cardiovascular: Positive for leg swelling.  All other systems reviewed and are negative.      Objective:   Physical Exam        Assessment & Plan:

## 2016-04-07 NOTE — Progress Notes (Signed)
Patient ID: ZEKAI VALENTA, male   DOB: Nov 02, 1928, 82 y.o.   MRN: [id number] Subjective: SERIGNE Lyle is a 82 y.o. male patient seen today in office with complaint of painful thickened and elongated toenails; unable to trim. Patient denies history of Diabetes or Vascular disease. Admits to neuropathy of unknown cause. Patient has no other pedal complaints at this time.   Review of Systems  HENT: Positive for hearing loss.  Cardiovascular: Positive for leg swelling.  All other systems reviewed and are negative.   Patient Active Problem List   Diagnosis Date Noted  . Memory difficulty 03/24/2016  . Hereditary and idiopathic peripheral neuropathy 03/24/2016  . Abnormality of gait 03/24/2016  . Peripheral neuropathy ([place]) 04/18/2015  . Hyperlipidemia 04/18/2015  . Prostate cancer ([place]) 08/24/2014  . Right inguinal hernia 10/30/2013    Current Outpatient Prescriptions on File Prior to Visit  Medication Sig Dispense Refill  . aspirin EC 81 MG tablet Take 81 mg by mouth daily.    . Calcium Carb-Cholecalciferol (CALCIUM 600 + D PO) Take 1 tablet by mouth daily.    Marland Kitchen gabapentin (NEURONTIN) 600 MG tablet Take 1 tablet (600 mg total) by mouth 3 (three) times daily. Start 1po qd for 1 day, then 1 po BID for 1 day, then 1po tid. 270 tablet 0  . hydrochlorothiazide (HYDRODIURIL) 25 MG tablet Take 1 tablet (25 mg total) by mouth daily. 90 tablet 1  . Leuprolide Acetate (LUPRON IJ) Inject 1 application as directed every 6 (six) months.    . simvastatin (ZOCOR) 40 MG tablet Take 1 tablet (40 mg total) by mouth daily. 90 tablet 1  . tamsulosin (FLOMAX) 0.4 MG CAPS capsule Reported on 03/11/2016  1   No current facility-administered medications on file prior to visit.    Allergies  Allergen Reactions  . Lyrica [Pregabalin] Other (See Comments)    "constipation"    Objective: Physical Exam  General: Well developed, nourished, no acute distress, awake, alert and oriented x 3  Vascular:  Dorsalis pedis artery 2/4 bilateral, Posterior tibial artery 1/4 bilateral, skin temperature warm to warm proximal to distal bilateral lower extremities, + varicosities, Decreased pedal hair present bilateral.  Neurological: Gross sensation present via light touch bilateral. Protective and vibratory sensation decreased bilateral.   Dermatological: Skin is warm, dry, and supple bilateral, Nails 1-10 are tender, long, thick, and discolored with mild subungal debris, no webspace macerations present bilateral, no open lesions present bilateral, no callus/corns/hyperkeratotic tissue present bilateral. No signs of infection bilateral.  Musculoskeletal: No symptomatic boney deformities noted bilateral. Muscular strength within normal limits without painon range of motion. No pain with calf compression bilateral.  Assessment and Plan:  Problem List Items Addressed This Visit    None    Visit Diagnoses    Dermatophytosis of nail    -  Primary    Foot pain, bilateral        Neuropathy ([place])          -Examined patient.  -Discussed treatment options for painful mycotic nails. -Mechanically debrided and reduced mycotic nails with sterile nail nipper and dremel nail file without incident. -Recommend daily foot inspection in the setting of neuropathy -Patient to return in 3 months for follow up evaluation or sooner if symptoms worsen.  Landis Martins, DPM

## 2016-05-12 DIAGNOSIS — R310 Gross hematuria: Secondary | ICD-10-CM | POA: Diagnosis not present

## 2016-05-12 DIAGNOSIS — Z0000 Encounter for general adult medical examination without abnormal findings: Secondary | ICD-10-CM | POA: Diagnosis not present

## 2016-05-12 DIAGNOSIS — R338 Other retention of urine: Secondary | ICD-10-CM | POA: Diagnosis not present

## 2016-05-12 DIAGNOSIS — R350 Frequency of micturition: Secondary | ICD-10-CM | POA: Diagnosis not present

## 2016-05-14 ENCOUNTER — Ambulatory Visit (INDEPENDENT_AMBULATORY_CARE_PROVIDER_SITE_OTHER): Payer: Medicare Other | Admitting: Physician Assistant

## 2016-05-14 VITALS — BP 136/50 | HR 84 | Temp 98.5°F | Resp 17 | Ht 73.0 in | Wt 177.0 lb

## 2016-05-14 DIAGNOSIS — W57XXXA Bitten or stung by nonvenomous insect and other nonvenomous arthropods, initial encounter: Secondary | ICD-10-CM

## 2016-05-14 DIAGNOSIS — S70362A Insect bite (nonvenomous), left thigh, initial encounter: Secondary | ICD-10-CM

## 2016-05-14 DIAGNOSIS — L539 Erythematous condition, unspecified: Secondary | ICD-10-CM

## 2016-05-14 DIAGNOSIS — T148 Other injury of unspecified body region: Secondary | ICD-10-CM | POA: Diagnosis not present

## 2016-05-14 MED ORDER — DOXYCYCLINE HYCLATE 100 MG PO CAPS
100.0000 mg | ORAL_CAPSULE | Freq: Two times a day (BID) | ORAL | Status: AC
Start: 2016-05-14 — End: 2016-05-24

## 2016-05-14 NOTE — Patient Instructions (Addendum)
Stop keflex. Start doxy twice a day for 10 days. Eat yogurt or take a digestive probiotic while on this antibiotic. I will call you with your lab results. Follow up with urology next week    IF you received an x-ray today, you will receive an invoice from [facility]. Please contact [facility] at [phone] with questions or concerns regarding your invoice.   IF you received labwork today, you will receive an invoice from [employer]. Please contact [facility] at [phone] with questions or concerns regarding your invoice.   Our billing staff will not be able to assist you with questions regarding bills from these companies.  You will be contacted with the lab results as soon as they are available. The fastest way to get your results is to activate your My Chart account. Instructions are located on the last page of this paperwork. If you have not heard from [place] regarding the results in 2 weeks, please contact this office.

## 2016-05-14 NOTE — Progress Notes (Signed)
[facility] [address] [place] [address] [phone]  Date:  05/14/2016   Name:  Casey Fisher   DOB:  [birth year]   MRN:  [id number]  PCP:  [name], MD    Chief Complaint: Wound Infection   History of Present Illness:  This is a 82 y.o. male with PMH prostate cancer, HLD, memory difficulty who is presenting with left upper thigh redness x 1 week. Lesion is sore, no drainage. There is something sticking out of the wound, he isn't sure what it is but felt something moving in his left the past few days. He put alcohol, chlorox and lysol on the lesion today. Today he no longer feels anything moving in his leg. He denies fever, chills, new joint pain, headache, n/v, rash anywhere else. He was seen at his urologist's office, Dr. Junious Silk with [facility], on Tuesday for hematuria. He was placed on keflex 500 mg BID empirically. This is getting better.  Review of Systems:  Review of Systems See HPI  Patient Active Problem List   Diagnosis Date Noted  . Memory difficulty 03/24/2016  . Hereditary and idiopathic peripheral neuropathy 03/24/2016  . Abnormality of gait 03/24/2016  . Peripheral neuropathy ([place]) 04/18/2015  . Hyperlipidemia 04/18/2015  . Prostate cancer ([place]) 08/24/2014  . Right inguinal hernia 10/30/2013    Prior to Admission medications   Medication Sig Start Date End Date Taking? Authorizing Provider  Calcium Carb-Cholecalciferol (CALCIUM 600 + D PO) Take 1 tablet by mouth daily.   Yes Historical Provider, MD  gabapentin (NEURONTIN) 600 MG tablet Take 1 tablet (600 mg total) by mouth 3 (three) times daily. Start 1po qd for 1 day, then 1 po BID for 1 day, then 1po tid. 03/11/16  Yes [name], MD  hydrochlorothiazide (HYDRODIURIL) 25 MG tablet Take 1 tablet (25 mg total) by mouth daily. 03/11/16  Yes [name], MD  Leuprolide Acetate (LUPRON IJ) Inject 1 application as directed every 6 (six) months.   Yes Historical  Provider, MD  simvastatin (ZOCOR) 40 MG tablet Take 1 tablet (40 mg total) by mouth daily. 03/11/16  Yes [name], MD  aspirin EC 81 MG tablet Take 81 mg by mouth daily. Reported on 05/14/2016    Historical Provider, MD  tamsulosin (FLOMAX) 0.4 MG CAPS capsule Reported on 05/14/2016 08/17/15   Historical Provider, MD    Allergies  Allergen Reactions  . Lyrica [Pregabalin] Other (See Comments)    "constipation"    Past Surgical History  Procedure Laterality Date  . Prostate surgery    . Eye surgery    . Hernia repair  1985  . Inguinal hernia repair Right 12/06/2013    Procedure: HERNIA REPAIR INGUINAL ADULT;  Surgeon: Merrie Roof, MD;  Location: [facility];  Service: General;  Laterality: Right;  . Insertion of mesh Right 12/06/2013    Procedure: INSERTION OF MESH;  Surgeon: Merrie Roof, MD;  Location: [facility];  Service: General;  Laterality: Right;  . Cataract extraction Bilateral     Social History  Substance Use Topics  . Smoking status: Former Research scientist (life sciences)  . Smokeless tobacco: None  . Alcohol Use: No    Family History  Problem Relation Age of Onset  . Brain cancer Daughter   . Heart disease Mother   . Stroke Mother   . Heart disease Father     Medication list has been reviewed and updated.  Physical Examination:  Physical  Exam  Constitutional: He is oriented to person, place, and time. He appears well-developed and well-nourished. No distress.  HENT:  Head: Normocephalic and atraumatic.  Right Ear: Hearing normal.  Left Ear: Hearing normal.  Nose: Nose normal.  Eyes: Conjunctivae and lids are normal. Right eye exhibits no discharge. Left eye exhibits no discharge. No scleral icterus.  Pulmonary/Chest: Effort normal. No respiratory distress.  Musculoskeletal: Normal range of motion.  Neurological: He is alert and oriented to person, place, and time.  Skin: Skin is warm, dry and intact.  Left inner thigh with nickel sized beefy red lesion. Surround 1-2 cm more  mild erythema with some mild induration. At center of beefy red lesion there is a dead tick attached. Forceps were used to remove tick, easily removed with head attached.  Psychiatric: He has a normal mood and affect. His speech is normal and behavior is normal. Thought content normal.   BP 136/50 mmHg  Pulse 84  Temp(Src) 98.5 F (36.9 C) (Oral)  Resp 17  Ht 6\' 1"  (1.854 m)  Wt 177 lb (80.287 kg)  BMI 23.36 kg/m2  SpO2 95%  Assessment and Plan:  1. Tick bite 2. Skin erythema Tick removed. RMSF and lyme titers pending. Called and spoke with NP Keene Breath at [facility] who thought it would be fine to switch pt from keflex to doxy. He will take doxy BID x 10 days, while awaiting titers. He will follow here if symptoms not improving in next 4-5 days or at any time if symptoms worsen. Follow up with urology in 5 days. - Rocky mtn spotted fvr abs pnl(IgG+IgM) - Lyme Ab/Western Blot Reflex - doxycycline (VIBRAMYCIN) 100 MG capsule; Take 1 capsule (100 mg total) by mouth 2 (two) times daily. AVOID EXCESS SUN EXPOSURE WHILE ON THIS MEDICATION  Dispense: 20 capsule; Refill: 0   Discussed case with Dr. Georgana Curio V. Drenda Freeze, MHS [facility]  05/14/2016

## 2016-05-15 LAB — LYME AB/WESTERN BLOT REFLEX

## 2016-05-18 LAB — REFLEX RMSF IGG TITER: RMSF IgG Titer: 1:64 {titer} — ABNORMAL HIGH

## 2016-05-18 LAB — ROCKY MTN SPOTTED FVR ABS PNL(IGG+IGM)
RMSF IGM: NOT DETECTED
RMSF IgG: DETECTED — AB

## 2016-05-19 DIAGNOSIS — Z87448 Personal history of other diseases of urinary system: Secondary | ICD-10-CM | POA: Diagnosis not present

## 2016-05-19 DIAGNOSIS — N35011 Post-traumatic bulbous urethral stricture: Secondary | ICD-10-CM | POA: Diagnosis not present

## 2016-05-19 DIAGNOSIS — Z0000 Encounter for general adult medical examination without abnormal findings: Secondary | ICD-10-CM | POA: Diagnosis not present

## 2016-05-19 DIAGNOSIS — N390 Urinary tract infection, site not specified: Secondary | ICD-10-CM | POA: Diagnosis not present

## 2016-05-19 DIAGNOSIS — N320 Bladder-neck obstruction: Secondary | ICD-10-CM | POA: Diagnosis not present

## 2016-05-19 DIAGNOSIS — C61 Malignant neoplasm of prostate: Secondary | ICD-10-CM | POA: Diagnosis not present

## 2016-06-15 ENCOUNTER — Other Ambulatory Visit: Payer: Self-pay | Admitting: Family Medicine

## 2016-06-17 ENCOUNTER — Ambulatory Visit (INDEPENDENT_AMBULATORY_CARE_PROVIDER_SITE_OTHER): Payer: Medicare Other | Admitting: Family Medicine

## 2016-06-17 VITALS — BP 130/80 | HR 75 | Temp 98.6°F | Resp 18 | Ht 73.0 in | Wt 175.0 lb

## 2016-06-17 DIAGNOSIS — G6289 Other specified polyneuropathies: Secondary | ICD-10-CM

## 2016-06-17 MED ORDER — GABAPENTIN 600 MG PO TABS: 600.0000 mg | ORAL_TABLET | Freq: Three times a day (TID) | ORAL | Status: DC

## 2016-06-17 NOTE — Patient Instructions (Addendum)
IF you received an x-ray today, you will receive an invoice from [facility]. Please contact [facility] at [phone] with questions or concerns regarding your invoice.   IF you received labwork today, you will receive an invoice from [employer]. Please contact [facility] at [phone] with questions or concerns regarding your invoice.   Our billing staff will not be able to assist you with questions regarding bills from these companies.  You will be contacted with the lab results as soon as they are available. The fastest way to get your results is to activate your My Chart account. Instructions are located on the last page of this paperwork. If you have not heard from [place] regarding the results in 2 weeks, please contact this office.     Stand same dose of gabapentin for now, but if you are having increasing or worsening burning pain in your feet, we could consider going up on the dose slightly. Follow-up with me in the next 3 months, sooner if worse. Also follow-up with your neurologist as planned and you can also discuss your neuropathy at that visit. Let me know if you have any questions in the meantime.  Peripheral Neuropathy Peripheral neuropathy is a type of nerve damage. It affects nerves that carry signals between the spinal cord and other parts of the body. These are called peripheral nerves. With peripheral neuropathy, one nerve or a group of nerves may be damaged.  CAUSES  Many things can damage peripheral nerves. For some people with peripheral neuropathy, the cause is unknown. Some causes include:  Diabetes. This is the most common cause of peripheral neuropathy.  Injury to a nerve.  Pressure or stress on a nerve that lasts a long time.  Too little vitamin B. Alcoholism can lead to this.  Infections.  Autoimmune diseases, such as multiple sclerosis and systemic lupus erythematosus.  Inherited nerve diseases.  Some  medicines, such as cancer drugs.  Toxic substances, such as lead and mercury.  Too little blood flowing to the legs.  Kidney disease.  Thyroid disease. SIGNS AND SYMPTOMS  Different people have different symptoms. The symptoms you have will depend on which of your nerves is damaged. Common symptoms include:  Loss of feeling (numbness) in the feet and hands.  Tingling in the feet and hands.  Pain that burns.  Very sensitive skin.  Weakness.  Not being able to move a part of the body (paralysis).  Muscle twitching.  Clumsiness or poor coordination.  Loss of balance.  Not being able to control your bladder.  Feeling dizzy.  Sexual problems. DIAGNOSIS  Peripheral neuropathy is a symptom, not a disease. Finding the cause of peripheral neuropathy can be hard. To figure that out, your health care provider will take a medical history and do a physical exam. A neurological exam will also be done. This involves checking things affected by your brain, spinal cord, and nerves (nervous system). For example, your health care provider will check your reflexes, how you move, and what you can feel.  Other types of tests may also be ordered, such as:  Blood tests.  A test of the fluid in your spinal cord.  Imaging tests, such as CT scans or an MRI.  Electromyography (EMG). This test checks the nerves that control muscles.  Nerve conduction velocity tests. These tests check how fast messages pass through your nerves.  Nerve biopsy. A small piece of nerve is removed. It is then checked under a  microscope. TREATMENT   Medicine is often used to treat peripheral neuropathy. Medicines may include:  Pain-relieving medicines. Prescription or over-the-counter medicine may be suggested.  Antiseizure medicine. This may be used for pain.  Antidepressants. These also may help ease pain from neuropathy.  Lidocaine. This is a numbing medicine. You might wear a patch or be given a  shot.  Mexiletine. This medicine is typically used to help control irregular heart rhythms.  Surgery. Surgery may be needed to relieve pressure on a nerve or to destroy a nerve that is causing pain.  Physical therapy to help movement.  Assistive devices to help movement. HOME CARE INSTRUCTIONS   Only take over-the-counter or prescription medicines as directed by your health care provider. Follow the instructions carefully for any given medicines. Do not take any other medicines without first getting approval from your health care provider.  If you have diabetes, work closely with your health care provider to keep your blood sugar under control.  If you have numbness in your feet:  Check every day for signs of injury or infection. Watch for redness, warmth, and swelling.  Wear padded socks and comfortable shoes. These help protect your feet.  Do not do things that put pressure on your damaged nerve.  Do not smoke. Smoking keeps blood from getting to damaged nerves.  Avoid or limit alcohol. Too much alcohol can cause a lack of B vitamins. These vitamins are needed for healthy nerves.  Develop a good support system. Coping with peripheral neuropathy can be stressful. Talk to a mental health specialist or join a support group if you are struggling.  Follow up with your health care provider as directed. SEEK MEDICAL CARE IF:   You have new signs or symptoms of peripheral neuropathy.  You are struggling emotionally from dealing with peripheral neuropathy.  You have a fever. SEEK IMMEDIATE MEDICAL CARE IF:   You have an injury or infection that is not healing.  You feel very dizzy or begin vomiting.  You have chest pain.  You have trouble breathing.   This information is not intended to replace advice given to you by your health care provider. Make sure you discuss any questions you have with your health care provider.   Document Released: 12/04/2002 Document Revised:  08/26/2011 Document Reviewed: 08/21/2013 Elsevier Interactive Patient Education [employer].

## 2016-06-17 NOTE — Progress Notes (Signed)
By signing my name below, I, Mesha Guinyard, attest that this documentation has been prepared under the direction and in the presence of Merri Ray, MD.  Electronically Signed: Verlee Monte, Medical Scribe. 06/17/2016. 1:55 PM.  Subjective:    Patient ID: Casey Fisher, male    DOB: [birth year], 82 y.o.   MRN: [id number]  HPI Chief Complaint  Patient presents with  . Medication Refill    Gabapentin, lost medication    HPI Comments: Casey Fisher is a 82 y.o. male who presents to the [facility] complaining for refill on his Gabapentin with a PMHx of peripheral neuropathy. Had a recent visit with Dr. [name] in March. Of note he was noted to have a mild memory disturbance from his neurology visit. Pt states he lost his medication and has no idea where it was. Pt was up until 4 am this morning and he never found it. Pt has been without it for 2 days now. Pt takes 1 pill 3 times a day. Pt states today is going to be his good day for his feet if he had the pill or not. Pt wants to know if he can put on weight.  Pt goes to urologist in July. Pt states he doesn't see any of the doctors in June.  Patient Active Problem List   Diagnosis Date Noted  . Memory difficulty 03/24/2016  . Hereditary and idiopathic peripheral neuropathy 03/24/2016  . Abnormality of gait 03/24/2016  . Peripheral neuropathy ([place]) 04/18/2015  . Hyperlipidemia 04/18/2015  . Prostate cancer ([place]) 08/24/2014  . Right inguinal hernia 10/30/2013   Past Medical History  Diagnosis Date  . Cancer ([place])   . Hypertension   . Prostate cancer ([place])     remission  . Neuropathy ([place])   . Depression   . Neuromuscular disorder ([place])   . Cataract   . Heart murmur   . Anxiety   . Memory difficulty 03/24/2016  . Hereditary and idiopathic peripheral neuropathy 03/24/2016  . Abnormality of gait 03/24/2016   Past Surgical History  Procedure Laterality Date  . Prostate surgery    . Eye surgery      . Hernia repair  1985  . Inguinal hernia repair Right 12/06/2013    Procedure: HERNIA REPAIR INGUINAL ADULT;  Surgeon: Merrie Roof, MD;  Location: [place];  Service: General;  Laterality: Right;  . Insertion of mesh Right 12/06/2013    Procedure: INSERTION OF MESH;  Surgeon: Merrie Roof, MD;  Location: [place];  Service: General;  Laterality: Right;  . Cataract extraction Bilateral    Allergies  Allergen Reactions  . Lyrica [Pregabalin] Other (See Comments)    "constipation"   Prior to Admission medications   Medication Sig Start Date End Date Taking? Authorizing Provider  aspirin EC 81 MG tablet Take 81 mg by mouth daily. Reported on 05/14/2016    Historical Provider, MD  Calcium Carb-Cholecalciferol (CALCIUM 600 + D PO) Take 1 tablet by mouth daily.    Historical Provider, MD  gabapentin (NEURONTIN) 600 MG tablet Take 1 tablet (600 mg total) by mouth 3 (three) times daily. Start 1po qd for 1 day, then 1 po BID for 1 day, then 1po tid. 03/11/16   [name], MD  hydrochlorothiazide (HYDRODIURIL) 25 MG tablet Take 1 tablet (25 mg total) by mouth daily. 03/11/16   [name], MD  Leuprolide Acetate (LUPRON IJ) Inject 1 application as directed every 6 (six) months.  Historical Provider, MD  simvastatin (ZOCOR) 40 MG tablet Take 1 tablet (40 mg total) by mouth daily. 03/11/16   [name], MD  tamsulosin [facility]) 0.4 MG CAPS capsule Reported on 05/14/2016 08/17/15   Historical Provider, MD   Social History   Social History  . Marital Status: Widowed    Spouse Name: N/A  . Number of Children: N/A  . Years of Education: N/A   Occupational History  . Not on file.   Social History Main Topics  . Smoking status: Former Research scientist (life sciences)  . Smokeless tobacco: Not on file  . Alcohol Use: No  . Drug Use: No  . Sexual Activity: Not on file   Other Topics Concern  . Not on file   Social History Narrative   Lives home alone.  Retired.  Widowed.  % children.     Review of  Systems  Constitutional: Negative for unexpected weight change.   Objective:  BP 130/80 mmHg  Pulse 75  Temp(Src) 98.6 F (37 C) (Oral)  Resp 18  Ht 6\' 1"  (1.854 m)  Wt 175 lb (79.379 kg)  BMI 23.09 kg/m2  SpO2 97%  Physical Exam  Constitutional: He appears well-developed and well-nourished. No distress.  HENT:  Head: Normocephalic and atraumatic.  Eyes: Conjunctivae are normal.  Neck: Neck supple.  Cardiovascular: Normal rate, regular rhythm and normal heart sounds.  Exam reveals no gallop and no friction rub.   No murmur heard. Pulmonary/Chest: Effort normal and breath sounds normal. No respiratory distress. He has no wheezes. He has no rales.  Neurological: He is alert.  Skin: Skin is warm and dry.  Psychiatric: He has a normal mood and affect. His behavior is normal.  Nursing note and vitals reviewed.   Assessment & Plan:   Casey Fisher is a 82 y.o. male Other polyneuropathy ([place]) - Plan: gabapentin (NEURONTIN) 600 MG tablet  - Appears to be overall stable, and recent neuro eval. No change in medications for now, refilled prescription as he lost his prescription home. Follow-up the next few months or sooner if he feels he needs to be at a higher dose. Keep neurology follow-up as well.   Meds ordered this encounter  Medications  . gabapentin (NEURONTIN) 600 MG tablet    Sig: Take 1 tablet (600 mg total) by mouth 3 (three) times daily.    Dispense:  270 tablet    Refill:  0   Patient Instructions       IF you received an x-ray today, you will receive an invoice from [facility]. Please contact [facility] at [phone] with questions or concerns regarding your invoice.   IF you received labwork today, you will receive an invoice from [employer]. Please contact [facility] at [phone] with questions or concerns regarding your invoice.   Our billing staff will not be able to assist you with questions regarding  bills from these companies.  You will be contacted with the lab results as soon as they are available. The fastest way to get your results is to activate your My Chart account. Instructions are located on the last page of this paperwork. If you have not heard from [place] regarding the results in 2 weeks, please contact this office.     Stand same dose of gabapentin for now, but if you are having increasing or worsening burning pain in your feet, we could consider going up on the dose slightly. Follow-up with me in the next 3 months, sooner  if worse. Also follow-up with your neurologist as planned and you can also discuss your neuropathy at that visit. Let me know if you have any questions in the meantime.  Peripheral Neuropathy Peripheral neuropathy is a type of nerve damage. It affects nerves that carry signals between the spinal cord and other parts of the body. These are called peripheral nerves. With peripheral neuropathy, one nerve or a group of nerves may be damaged.  CAUSES  Many things can damage peripheral nerves. For some people with peripheral neuropathy, the cause is unknown. Some causes include:  Diabetes. This is the most common cause of peripheral neuropathy.  Injury to a nerve.  Pressure or stress on a nerve that lasts a long time.  Too little vitamin B. Alcoholism can lead to this.  Infections.  Autoimmune diseases, such as multiple sclerosis and systemic lupus erythematosus.  Inherited nerve diseases.  Some medicines, such as cancer drugs.  Toxic substances, such as lead and mercury.  Too little blood flowing to the legs.  Kidney disease.  Thyroid disease. SIGNS AND SYMPTOMS  Different people have different symptoms. The symptoms you have will depend on which of your nerves is damaged. Common symptoms include:  Loss of feeling (numbness) in the feet and hands.  Tingling in the feet and hands.  Pain that burns.  Very sensitive skin.  Weakness.  Not being  able to move a part of the body (paralysis).  Muscle twitching.  Clumsiness or poor coordination.  Loss of balance.  Not being able to control your bladder.  Feeling dizzy.  Sexual problems. DIAGNOSIS  Peripheral neuropathy is a symptom, not a disease. Finding the cause of peripheral neuropathy can be hard. To figure that out, your health care provider will take a medical history and do a physical exam. A neurological exam will also be done. This involves checking things affected by your brain, spinal cord, and nerves (nervous system). For example, your health care provider will check your reflexes, how you move, and what you can feel.  Other types of tests may also be ordered, such as:  Blood tests.  A test of the fluid in your spinal cord.  Imaging tests, such as CT scans or an MRI.  Electromyography (EMG). This test checks the nerves that control muscles.  Nerve conduction velocity tests. These tests check how fast messages pass through your nerves.  Nerve biopsy. A small piece of nerve is removed. It is then checked under a microscope. TREATMENT   Medicine is often used to treat peripheral neuropathy. Medicines may include:  Pain-relieving medicines. Prescription or over-the-counter medicine may be suggested.  Antiseizure medicine. This may be used for pain.  Antidepressants. These also may help ease pain from neuropathy.  Lidocaine. This is a numbing medicine. You might wear a patch or be given a shot.  Mexiletine. This medicine is typically used to help control irregular heart rhythms.  Surgery. Surgery may be needed to relieve pressure on a nerve or to destroy a nerve that is causing pain.  Physical therapy to help movement.  Assistive devices to help movement. HOME CARE INSTRUCTIONS   Only take over-the-counter or prescription medicines as directed by your health care provider. Follow the instructions carefully for any given medicines. Do not take any other  medicines without first getting approval from your health care provider.  If you have diabetes, work closely with your health care provider to keep your blood sugar under control.  If you have numbness in your feet:  Check every day for signs of injury or infection. Watch for redness, warmth, and swelling.  Wear padded socks and comfortable shoes. These help protect your feet.  Do not do things that put pressure on your damaged nerve.  Do not smoke. Smoking keeps blood from getting to damaged nerves.  Avoid or limit alcohol. Too much alcohol can cause a lack of B vitamins. These vitamins are needed for healthy nerves.  Develop a good support system. Coping with peripheral neuropathy can be stressful. Talk to a mental health specialist or join a support group if you are struggling.  Follow up with your health care provider as directed. SEEK MEDICAL CARE IF:   You have new signs or symptoms of peripheral neuropathy.  You are struggling emotionally from dealing with peripheral neuropathy.  You have a fever. SEEK IMMEDIATE MEDICAL CARE IF:   You have an injury or infection that is not healing.  You feel very dizzy or begin vomiting.  You have chest pain.  You have trouble breathing.   This information is not intended to replace advice given to you by your health care provider. Make sure you discuss any questions you have with your health care provider.   Document Released: 12/04/2002 Document Revised: 08/26/2011 Document Reviewed: 08/21/2013 Elsevier Interactive Patient Education [employer].     I personally performed the services described in this documentation, which was scribed in my presence. The recorded information has been reviewed and considered, and addended by me as needed.   Signed,   Merri Ray, MD [facility].  06/17/2016 2:07 PM

## 2016-07-07 ENCOUNTER — Ambulatory Visit (INDEPENDENT_AMBULATORY_CARE_PROVIDER_SITE_OTHER): Payer: Medicare Other | Admitting: Sports Medicine

## 2016-07-07 ENCOUNTER — Encounter: Payer: Self-pay | Admitting: Sports Medicine

## 2016-07-07 DIAGNOSIS — M79671 Pain in right foot: Secondary | ICD-10-CM

## 2016-07-07 DIAGNOSIS — B351 Tinea unguium: Secondary | ICD-10-CM | POA: Diagnosis not present

## 2016-07-07 DIAGNOSIS — M79672 Pain in left foot: Secondary | ICD-10-CM | POA: Diagnosis not present

## 2016-07-07 DIAGNOSIS — G629 Polyneuropathy, unspecified: Secondary | ICD-10-CM

## 2016-07-07 NOTE — Progress Notes (Signed)
Patient ID: Casey Fisher, male   DOB: Sep 22, 1928, 82 y.o.   MRN: [id number]  Subjective: Casey Fisher is a 82 y.o. male patient seen today in office with complaint of painful thickened and elongated toenails; unable to trim. Patient denies history of Diabetes or Vascular disease. Admits to neuropathy of unknown cause. Patient has no other pedal complaints at this time.    Patient Active Problem List   Diagnosis Date Noted  . Memory difficulty 03/24/2016  . Hereditary and idiopathic peripheral neuropathy 03/24/2016  . Abnormality of gait 03/24/2016  . Peripheral neuropathy ([place]) 04/18/2015  . Hyperlipidemia 04/18/2015  . Prostate cancer ([place]) 08/24/2014  . Right inguinal hernia 10/30/2013    Current Outpatient Prescriptions on File Prior to Visit  Medication Sig Dispense Refill  . aspirin EC 81 MG tablet Take 81 mg by mouth daily. Reported on 05/14/2016    . Calcium Carb-Cholecalciferol (CALCIUM 600 + D PO) Take 1 tablet by mouth daily.    Marland Kitchen gabapentin (NEURONTIN) 600 MG tablet Take 1 tablet (600 mg total) by mouth 3 (three) times daily. 270 tablet 0  . hydrochlorothiazide (HYDRODIURIL) 25 MG tablet Take 1 tablet (25 mg total) by mouth daily. 90 tablet 1  . Leuprolide Acetate (LUPRON IJ) Inject 1 application as directed every 6 (six) months.    . simvastatin (ZOCOR) 40 MG tablet Take 1 tablet (40 mg total) by mouth daily. 90 tablet 1  . tamsulosin (FLOMAX) 0.4 MG CAPS capsule Reported on 05/14/2016  1   No current facility-administered medications on file prior to visit.    Allergies  Allergen Reactions  . Lyrica [Pregabalin] Other (See Comments)    "constipation"    Objective: Physical Exam  General: Well developed, nourished, no acute distress, awake, alert and oriented x 3  Vascular: Dorsalis pedis artery 2/4 bilateral, Posterior tibial artery 1/4 bilateral, skin temperature warm to warm proximal to distal bilateral lower extremities, + varicosities, Decreased pedal  hair present bilateral.  Neurological: Gross sensation present via light touch bilateral. Protective and vibratory sensation decreased bilateral.   Dermatological: Skin is warm, dry, and supple bilateral, Nails 1-10 are tender, long, thick, and discolored with mild subungal debris, no webspace macerations present bilateral, no open lesions present bilateral, no callus/corns/hyperkeratotic tissue present bilateral. No signs of infection bilateral.  Musculoskeletal:  Asymptomatic bunion and hammertoe boney deformities noted bilateral. Muscular strength within normal limits without painon range of motion. No pain with calf compression bilateral.  Assessment and Plan:  Problem List Items Addressed This Visit    None    Visit Diagnoses    Dermatophytosis of nail    -  Primary    Foot pain, bilateral        Neuropathy ([place])          -Examined patient.  -Discussed treatment options for painful mycotic nails. -Mechanically debrided and reduced mycotic nails with sterile nail nipper and dremel nail file without incident. -Recommend continue with daily foot inspection in the setting of neuropathy -Patient to return in 3 months for follow up evaluation or sooner if symptoms worsen.  Landis Martins, DPM

## 2016-07-08 DIAGNOSIS — C61 Malignant neoplasm of prostate: Secondary | ICD-10-CM | POA: Diagnosis not present

## 2016-07-14 ENCOUNTER — Ambulatory Visit (HOSPITAL_COMMUNITY)
Admission: RE | Admit: 2016-07-14 | Discharge: 2016-07-14 | Disposition: A | Discharge status: Home / Self Care | Payer: Medicare Other | Source: Ambulatory Visit | Attending: Urology | Admitting: Urology

## 2016-07-14 ENCOUNTER — Encounter (HOSPITAL_COMMUNITY)
Admission: RE | Admit: 2016-07-14 | Discharge: 2016-07-14 | Disposition: A | Discharge status: Home / Self Care | Payer: Medicare Other | Source: Ambulatory Visit | Attending: Urology | Admitting: Urology

## 2016-07-14 DIAGNOSIS — C61 Malignant neoplasm of prostate: Secondary | ICD-10-CM | POA: Insufficient documentation

## 2016-07-14 MED ORDER — TECHNETIUM TC 99M MEDRONATE IV KIT
26.1000 | PACK | Freq: Once | INTRAVENOUS | Status: AC | PRN
  Administered 2016-07-14: 26.1 via INTRAVENOUS

## 2016-09-16 ENCOUNTER — Other Ambulatory Visit: Payer: Self-pay | Admitting: Family Medicine

## 2016-09-16 DIAGNOSIS — G6289 Other specified polyneuropathies: Secondary | ICD-10-CM

## 2016-09-20 ENCOUNTER — Other Ambulatory Visit: Payer: Self-pay | Admitting: Family Medicine

## 2016-09-20 DIAGNOSIS — I10 Essential (primary) hypertension: Secondary | ICD-10-CM

## 2016-09-25 ENCOUNTER — Encounter: Payer: Self-pay | Admitting: Neurology

## 2016-09-25 ENCOUNTER — Ambulatory Visit (INDEPENDENT_AMBULATORY_CARE_PROVIDER_SITE_OTHER): Payer: Medicare Other | Admitting: Neurology

## 2016-09-25 VITALS — BP 136/70 | HR 52 | Ht 73.0 in | Wt 175.5 lb

## 2016-09-25 DIAGNOSIS — R413 Other amnesia: Secondary | ICD-10-CM

## 2016-09-25 DIAGNOSIS — G6189 Other inflammatory polyneuropathies: Secondary | ICD-10-CM | POA: Diagnosis not present

## 2016-09-25 DIAGNOSIS — R269 Unspecified abnormalities of gait and mobility: Secondary | ICD-10-CM | POA: Diagnosis not present

## 2016-09-25 MED ORDER — DULOXETINE HCL 30 MG PO CPEP: 30.0000 mg | ORAL_CAPSULE | Freq: Every day | ORAL | 3 refills | Status: DC

## 2016-09-25 NOTE — Progress Notes (Signed)
Reason for visit: Peripheral neuropathy  Casey Fisher is an 82 y.o. male  History of present illness:  Casey Fisher is an 82 year old right-handed black male with a history of a peripheral neuropathy and a mild memory disturbance. The patient has been on gabapentin taking 600 mg 3 times daily for the neuropathy, he indicates that the pain level during the day is getting worse, but he sleeps well at night. The patient uses a cane for ambulation, he reports no falls. He has been on Lyrica in the past but this caused a lot of constipation. The patient believes that the left leg is longer than the right, he is putting tissues in the right shoe to buildup the right leg length. The patient has had some mild memory problems, but he believes that this has not progressed since last seen. His daughter is his power of attorney, she manages the finances. The patient keeps up with his own medications and appointments. He still operates a Teacher, music without difficulty. He returns for an evaluation.  Past Medical History:  Diagnosis Date  . Abnormality of gait 03/24/2016  . Anxiety   . Cancer ([place])   . Cataract   . Depression   . Heart murmur   . Hereditary and idiopathic peripheral neuropathy 03/24/2016  . Hypertension   . Memory difficulty 03/24/2016  . Neuromuscular disorder ([place])   . Neuropathy ([place])   . Prostate cancer ([place])    remission    Past Surgical History:  Procedure Laterality Date  . CATARACT EXTRACTION Bilateral   . EYE SURGERY    . HERNIA REPAIR  1985  . INGUINAL HERNIA REPAIR Right 12/06/2013   Procedure: HERNIA REPAIR INGUINAL ADULT;  Surgeon: Merrie Roof, MD;  Location: [place];  Service: General;  Laterality: Right;  . INSERTION OF MESH Right 12/06/2013   Procedure: INSERTION OF MESH;  Surgeon: Merrie Roof, MD;  Location: [place];  Service: General;  Laterality: Right;  . PROSTATE SURGERY      Family History  Problem Relation Age of Onset  . Brain cancer  Daughter   . Heart disease Mother   . Stroke Mother   . Heart disease Father     Social history:  reports that he has quit smoking. He does not have any smokeless tobacco history on file. He reports that he does not drink alcohol or use drugs.    Allergies  Allergen Reactions  . Lyrica [Pregabalin] Other (See Comments)    "constipation"    Medications:  Prior to Admission medications   Medication Sig Start Date End Date Taking? Authorizing Provider  aspirin EC 81 MG tablet Take 81 mg by mouth daily. Reported on 05/14/2016   Yes Historical Provider, MD  Calcium Carb-Cholecalciferol (CALCIUM 600 + D PO) Take 1 tablet by mouth daily.   Yes Historical Provider, MD  gabapentin (NEURONTIN) 600 MG tablet take 1 tablet by mouth three times a day 09/18/16  Yes [name], MD  hydrochlorothiazide (HYDRODIURIL) 25 MG tablet take 1 tablet by mouth once daily 09/22/16  Yes [name], MD  Leuprolide Acetate (LUPRON IJ) Inject 1 application as directed every 6 (six) months.   Yes Historical Provider, MD  simvastatin (ZOCOR) 40 MG tablet Take 1 tablet (40 mg total) by mouth daily. 03/11/16  Yes [name], MD  tamsulosin [facility]) 0.4 MG CAPS capsule Reported on 05/14/2016 08/17/15  Yes Historical Provider, MD    ROS:  Out  of a complete 14 system review of symptoms, the patient complains only of the following symptoms, and all other reviewed systems are negative.  Numbness Memory disturbance Walking problems  Blood pressure 136/70, pulse (!) 52, height 6\' 1"  (1.854 m), weight 175 lb 8 oz (79.6 kg).  Physical Exam  General: The patient is alert and cooperative at the time of the examination.  Skin: No significant peripheral edema is noted. Atrophy is seen in the intrinsic muscles of the hands bilaterally.   Neurologic Exam  Mental status: The patient is alert and oriented x 3 at the time of the examination. The patient has apparent normal recent and remote memory, with an  apparently normal attention span and concentration ability. MMSE score is 25/30.   Cranial nerves: Facial symmetry is present. Speech is normal, no aphasia or dysarthria is noted. Extraocular movements are full. Visual fields are full.  Motor: The patient has good strength in all 4 extremities, with exception of some weakness of the intrinsic muscles of the hands bilaterally.  Sensory examination: Soft touch sensation is symmetric on the face, arms, and legs.  Coordination: The patient has good finger-nose-finger and heel-to-shin bilaterally.  Gait and station: The patient has a short shuffling gait pattern. Tandem gait was not attempted. Romberg is negative.  Reflexes: Deep tendon reflexes are symmetric, but are depressed.   CT head 04/03/16:  IMPRESSION: This CT scan of the head without contrast shows the following: 1. Mild generalized cortical atrophy, unchanged when compared to the 01/16/2014 CT scan.  2. Extensive chronic microvascular ischemic changes in both hemispheres. There are large component fluencies adjacent to the frontal horns and posterior horns. The extent is more than expected for age but unchanged when compared to 01/16/2014 CT scan.  * CT scan images were reviewed online. I agree with the written report.    Assessment/Plan:  1. Peripheral neuropathy  2. Gait disturbance  3. Mild memory disturbance  The patient does not wish to go on a medication for memory at this time. He is noting increased pain from the neuropathy, he is bothered with the pain mainly during the day. The patient will have a low dose of Cymbalta added to his regimen. He will follow-up through this office in 6 months. He will contact our office if further medication management is required.  Casey Alexanders MD 09/25/2016 9:06 AM  [facility] [address] [place], [place] [address]  Phone [phone] Fax [phone]

## 2016-09-25 NOTE — Patient Instructions (Addendum)
We will add Cymbalta 30 mg a day for the neuropathy pain.  Fall Prevention in the Home  Falls can cause injuries and can affect people from all age groups. There are many simple things that you can do to make your home safe and to help prevent falls. WHAT CAN I DO ON THE OUTSIDE OF MY HOME?  Regularly repair the edges of walkways and driveways and fix any cracks.  Remove high doorway thresholds.  Trim any shrubbery on the main path into your home.  Use bright outdoor lighting.  Clear walkways of debris and clutter, including tools and rocks.  Regularly check that handrails are securely fastened and in good repair. Both sides of any steps should have handrails.  Install guardrails along the edges of any raised decks or porches.  Have leaves, snow, and ice cleared regularly.  Use sand or salt on walkways during winter months.  In the garage, clean up any spills right away, including grease or oil spills. WHAT CAN I DO IN THE BATHROOM?  Use night lights.  Install grab bars by the toilet and in the tub and shower. Do not use towel bars as grab bars.  Use non-skid mats or decals on the floor of the tub or shower.  If you need to sit down while you are in the shower, use a plastic, non-slip stool.Marland Kitchen  Keep the floor dry. Immediately clean up any water that spills on the floor.  Remove soap buildup in the tub or shower on a regular basis.  Attach bath mats securely with double-sided non-slip rug tape.  Remove throw rugs and other tripping hazards from the floor. WHAT CAN I DO IN THE BEDROOM?  Use night lights.  Make sure that a bedside light is easy to reach.  Do not use oversized bedding that drapes onto the floor.  Have a firm chair that has side arms to use for getting dressed.  Remove throw rugs and other tripping hazards from the floor. WHAT CAN I DO IN THE KITCHEN?   Clean up any spills right away.  Avoid walking on wet floors.  Place frequently used items  in easy-to-reach places.  If you need to reach for something above you, use a sturdy step stool that has a grab bar.  Keep electrical cables out of the way.  Do not use floor polish or wax that makes floors slippery. If you have to use wax, make sure that it is non-skid floor wax.  Remove throw rugs and other tripping hazards from the floor. WHAT CAN I DO IN THE STAIRWAYS?  Do not leave any items on the stairs.  Make sure that there are handrails on both sides of the stairs. Fix handrails that are broken or loose. Make sure that handrails are as long as the stairways.  Check any carpeting to make sure that it is firmly attached to the stairs. Fix any carpet that is loose or worn.  Avoid having throw rugs at the top or bottom of stairways, or secure the rugs with carpet tape to prevent them from moving.  Make sure that you have a light switch at the top of the stairs and the bottom of the stairs. If you do not have them, have them installed. WHAT ARE SOME OTHER FALL PREVENTION TIPS?  Wear closed-toe shoes that fit well and support your feet. Wear shoes that have rubber soles or low heels.  When you use a stepladder, make sure that it is completely opened  and that the sides are firmly locked. Have someone hold the ladder while you are using it. Do not climb a closed stepladder.  Add color or contrast paint or tape to grab bars and handrails in your home. Place contrasting color strips on the first and last steps.  Use mobility aids as needed, such as canes, walkers, scooters, and crutches.  Turn on lights if it is dark. Replace any light bulbs that burn out.  Set up furniture so that there are clear paths. Keep the furniture in the same spot.  Fix any uneven floor surfaces.  Choose a carpet design that does not hide the edge of steps of a stairway.  Be aware of any and all pets.  Review your medicines with your healthcare provider. Some medicines can cause dizziness or changes  in blood pressure, which increase your risk of falling. Talk with your health care provider about other ways that you can decrease your risk of falls. This may include working with a physical therapist or trainer to improve your strength, balance, and endurance.   This information is not intended to replace advice given to you by your health care provider. Make sure you discuss any questions you have with your health care provider.   Document Released: 12/04/2002 Document Revised: 04/30/2015 Document Reviewed: 01/18/2015 Elsevier Interactive Patient Education [employer].

## 2016-10-05 ENCOUNTER — Telehealth: Payer: Self-pay | Admitting: Neurology

## 2016-10-05 NOTE — Telephone Encounter (Signed)
I called patient. Cymbalta initially has call some diarrhea, but in the last day or so it has gotten better. We will see how things go over the next week, if the diarrhea comes back, he will stop the medication and call me.

## 2016-10-05 NOTE — Telephone Encounter (Signed)
Pt called in stating DULoxetine (CYMBALTA) 30 MG capsule is not agreeing with him. He is have frequent diarrhea. Although , it has lessened. Please call and advise [phone]

## 2016-10-13 ENCOUNTER — Ambulatory Visit (INDEPENDENT_AMBULATORY_CARE_PROVIDER_SITE_OTHER): Payer: Medicare Other | Admitting: Sports Medicine

## 2016-10-13 ENCOUNTER — Encounter: Payer: Self-pay | Admitting: Sports Medicine

## 2016-10-13 DIAGNOSIS — M79672 Pain in left foot: Secondary | ICD-10-CM | POA: Diagnosis not present

## 2016-10-13 DIAGNOSIS — M79671 Pain in right foot: Secondary | ICD-10-CM | POA: Diagnosis not present

## 2016-10-13 DIAGNOSIS — B351 Tinea unguium: Secondary | ICD-10-CM

## 2016-10-13 DIAGNOSIS — G629 Polyneuropathy, unspecified: Secondary | ICD-10-CM

## 2016-10-13 NOTE — Progress Notes (Signed)
Patient ID: ABDIMALIK MOUNTFORD, male   DOB: Feb 06, 1928, 82 y.o.   MRN: [id number]  Subjective: Casey Fisher is a 82 y.o. male patient seen today in office with complaint of painful thickened and elongated toenails; unable to trim. Patient history of neuropathy of unknown cause; started on Cymbalta by Neurologist. Patient has no other pedal complaints at this time.    Patient Active Problem List   Diagnosis Date Noted  . Memory difficulty 03/24/2016  . Hereditary and idiopathic peripheral neuropathy 03/24/2016  . Abnormality of gait 03/24/2016  . Peripheral neuropathy ([place]) 04/18/2015  . Hyperlipidemia 04/18/2015  . Prostate cancer ([place]) 08/24/2014  . Right inguinal hernia 10/30/2013    Current Outpatient Prescriptions on File Prior to Visit  Medication Sig Dispense Refill  . aspirin EC 81 MG tablet Take 81 mg by mouth daily. Reported on 05/14/2016    . Calcium Carb-Cholecalciferol (CALCIUM 600 + D PO) Take 1 tablet by mouth daily.    . DULoxetine (CYMBALTA) 30 MG capsule Take 1 capsule (30 mg total) by mouth daily. 30 capsule 3  . gabapentin (NEURONTIN) 600 MG tablet take 1 tablet by mouth three times a day 270 tablet 0  . hydrochlorothiazide (HYDRODIURIL) 25 MG tablet take 1 tablet by mouth once daily 90 tablet 1  . Leuprolide Acetate (LUPRON IJ) Inject 1 application as directed every 6 (six) months.    . simvastatin (ZOCOR) 40 MG tablet Take 1 tablet (40 mg total) by mouth daily. 90 tablet 1  . tamsulosin (FLOMAX) 0.4 MG CAPS capsule Reported on 05/14/2016  1   No current facility-administered medications on file prior to visit.     Allergies  Allergen Reactions  . Lyrica [Pregabalin] Other (See Comments)    "constipation"    Objective: Physical Exam  General: Well developed, nourished, no acute distress, awake, alert and oriented x 3  Vascular: Dorsalis pedis artery 2/4 bilateral, Posterior tibial artery 1/4 bilateral, skin temperature warm to warm proximal to distal  bilateral lower extremities, + varicosities, Decreased pedal hair present bilateral.  Neurological: Gross sensation present via light touch bilateral. Protective and vibratory sensation decreased bilateral.   Dermatological: Skin is warm, dry, and supple bilateral, Nails 1-10 are tender, long, thick, and discolored with mild subungal debris, no webspace macerations present bilateral, no open lesions present bilateral, no callus/corns/hyperkeratotic tissue present bilateral. No signs of infection bilateral.  Musculoskeletal:  Asymptomatic bunion and hammertoe boney deformities noted bilateral. Muscular strength within normal limits without painon range of motion. No pain with calf compression bilateral.  Assessment and Plan:  Problem List Items Addressed This Visit    None    Visit Diagnoses    Dermatophytosis of nail    -  Primary   Foot pain, bilateral       Neuropathy ([place])         -Examined patient.  -Discussed treatment options for painful mycotic nails. -Mechanically debrided and reduced mycotic nails with sterile nail nipper and dremel nail file without incident. -Recommend continue with daily foot inspection in the setting of neuropathy; continue with neuro recs -Patient to return in 3 months for follow up evaluation or sooner if symptoms worsen.  Landis Martins, DPM

## 2016-10-27 ENCOUNTER — Other Ambulatory Visit: Payer: Self-pay | Admitting: Urology

## 2016-10-27 DIAGNOSIS — C61 Malignant neoplasm of prostate: Secondary | ICD-10-CM

## 2016-10-29 ENCOUNTER — Encounter (HOSPITAL_COMMUNITY)
Admission: RE | Admit: 2016-10-29 | Discharge: 2016-10-29 | Disposition: A | Discharge status: Home / Self Care | Payer: Medicare Other | Source: Ambulatory Visit | Attending: Urology | Admitting: Urology

## 2016-10-29 DIAGNOSIS — C61 Malignant neoplasm of prostate: Secondary | ICD-10-CM

## 2016-10-29 MED ORDER — TECHNETIUM TC 99M MEDRONATE IV KIT
21.3000 | PACK | Freq: Once | INTRAVENOUS | Status: AC | PRN
  Administered 2016-10-29: 21.3 via INTRAVENOUS

## 2016-11-03 ENCOUNTER — Ambulatory Visit (INDEPENDENT_AMBULATORY_CARE_PROVIDER_SITE_OTHER): Payer: Medicare Other | Admitting: Physician Assistant

## 2016-11-03 DIAGNOSIS — Z23 Encounter for immunization: Secondary | ICD-10-CM | POA: Diagnosis not present

## 2016-11-23 ENCOUNTER — Other Ambulatory Visit: Payer: Self-pay | Admitting: Family Medicine

## 2016-11-23 DIAGNOSIS — E785 Hyperlipidemia, unspecified: Secondary | ICD-10-CM

## 2016-11-25 NOTE — Telephone Encounter (Signed)
05/2016 last ov 01/2016 last labs

## 2016-12-11 DIAGNOSIS — Z5111 Encounter for antineoplastic chemotherapy: Secondary | ICD-10-CM | POA: Diagnosis not present

## 2016-12-11 DIAGNOSIS — C61 Malignant neoplasm of prostate: Secondary | ICD-10-CM | POA: Diagnosis not present

## 2017-01-03 ENCOUNTER — Other Ambulatory Visit: Payer: Self-pay | Admitting: Family Medicine

## 2017-01-03 DIAGNOSIS — G6289 Other specified polyneuropathies: Secondary | ICD-10-CM

## 2017-01-12 ENCOUNTER — Encounter: Payer: Self-pay | Admitting: Sports Medicine

## 2017-01-12 ENCOUNTER — Ambulatory Visit (INDEPENDENT_AMBULATORY_CARE_PROVIDER_SITE_OTHER): Payer: Medicare Other | Admitting: Sports Medicine

## 2017-01-12 DIAGNOSIS — M79672 Pain in left foot: Secondary | ICD-10-CM

## 2017-01-12 DIAGNOSIS — M79671 Pain in right foot: Secondary | ICD-10-CM | POA: Diagnosis not present

## 2017-01-12 DIAGNOSIS — G629 Polyneuropathy, unspecified: Secondary | ICD-10-CM

## 2017-01-12 DIAGNOSIS — B351 Tinea unguium: Secondary | ICD-10-CM

## 2017-01-12 NOTE — Progress Notes (Signed)
Patient ID: Casey Fisher, male   DOB: 14-Feb-1928, 83 y.o.   MRN: [id number]  Subjective: Casey Fisher is a 83 y.o. male patient seen today in office with complaint of painful thickened and elongated toenails; unable to trim. Patient history of neuropathy of unknown cause on Cymbalta by Neurologist. Patient denies any medication changes and has no other pedal complaints at this time.    Patient Active Problem List   Diagnosis Date Noted  . Memory difficulty 03/24/2016  . Hereditary and idiopathic peripheral neuropathy 03/24/2016  . Abnormality of gait 03/24/2016  . Peripheral neuropathy ([place]) 04/18/2015  . Hyperlipidemia 04/18/2015  . Prostate cancer ([place]) 08/24/2014  . Right inguinal hernia 10/30/2013    Current Outpatient Prescriptions on File Prior to Visit  Medication Sig Dispense Refill  . aspirin EC 81 MG tablet Take 81 mg by mouth daily. Reported on 05/14/2016    . Calcium Carb-Cholecalciferol (CALCIUM 600 + D PO) Take 1 tablet by mouth daily.    . DULoxetine (CYMBALTA) 30 MG capsule Take 1 capsule (30 mg total) by mouth daily. 30 capsule 3  . gabapentin (NEURONTIN) 600 MG tablet take 1 tablet by mouth three times a day 270 tablet 0  . hydrochlorothiazide (HYDRODIURIL) 25 MG tablet take 1 tablet by mouth once daily 90 tablet 1  . Leuprolide Acetate (LUPRON IJ) Inject 1 application as directed every 6 (six) months.    . simvastatin (ZOCOR) 40 MG tablet take 1 tablet by mouth once daily 30 tablet 1  . tamsulosin (FLOMAX) 0.4 MG CAPS capsule Reported on 05/14/2016  1   No current facility-administered medications on file prior to visit.     Allergies  Allergen Reactions  . Lyrica [Pregabalin] Other (See Comments)    "constipation"    Objective: Physical Exam  General: Well developed, nourished, no acute distress, awake, alert and oriented x 3  Vascular: Dorsalis pedis artery 2/4 bilateral, Posterior tibial artery 1/4 bilateral, skin temperature warm to warm  proximal to distal bilateral lower extremities, + varicosities, Decreased pedal hair present bilateral.  Neurological: Gross sensation present via light touch bilateral. Protective and vibratory sensation decreased bilateral.   Dermatological: Skin is warm, dry, and supple bilateral, Nails 1-10 are tender, long, thick, and discolored with mild subungal debris, no webspace macerations present bilateral, no open lesions present bilateral, no callus/corns/hyperkeratotic tissue present bilateral. No signs of infection bilateral.  Musculoskeletal:  Asymptomatic bunion and hammertoe boney deformities noted bilateral. Muscular strength within normal limits without painon range of motion. No pain with calf compression bilateral.  Assessment and Plan:  Problem List Items Addressed This Visit    None    Visit Diagnoses    Dermatophytosis of nail    -  Primary   Foot pain, bilateral       Neuropathy ([place])         -Examined patient.  -Discussed treatment options for painful mycotic nails. -Mechanically debrided and reduced mycotic nails with sterile nail nipper and dremel nail file without incident. -Recommend continue with daily foot inspection in the setting of neuropathy; continue with neuro recs and cymbalta  -Patient to return in 3 months for follow up evaluation or sooner if symptoms worsen.  Landis Martins, DPM

## 2017-01-21 ENCOUNTER — Other Ambulatory Visit: Payer: Self-pay | Admitting: Neurology

## 2017-01-29 ENCOUNTER — Other Ambulatory Visit: Payer: Self-pay | Admitting: Family Medicine

## 2017-01-29 DIAGNOSIS — E785 Hyperlipidemia, unspecified: Secondary | ICD-10-CM

## 2017-02-01 DIAGNOSIS — C61 Malignant neoplasm of prostate: Secondary | ICD-10-CM | POA: Diagnosis not present

## 2017-02-01 DIAGNOSIS — N35012 Post-traumatic membranous urethral stricture: Secondary | ICD-10-CM | POA: Diagnosis not present

## 2017-02-27 ENCOUNTER — Other Ambulatory Visit: Payer: Self-pay | Admitting: Family Medicine

## 2017-02-27 DIAGNOSIS — E785 Hyperlipidemia, unspecified: Secondary | ICD-10-CM

## 2017-03-15 ENCOUNTER — Telehealth: Payer: Self-pay | Admitting: Family Medicine

## 2017-03-15 NOTE — Telephone Encounter (Signed)
Pt was prescribed simvastatin he thinks in January of this year, but was prescribed 15 tablets and says he is normally prescribed more at one time. He wanted to know if he needs to make an appointment in order to get more or if it can be called in. Best pt callback number is [phone]. Please advise.

## 2017-03-16 NOTE — Telephone Encounter (Signed)
Pt transferred to the schedulers

## 2017-03-18 ENCOUNTER — Ambulatory Visit (INDEPENDENT_AMBULATORY_CARE_PROVIDER_SITE_OTHER): Payer: Medicare Other | Admitting: Family Medicine

## 2017-03-18 ENCOUNTER — Encounter: Payer: Self-pay | Admitting: Family Medicine

## 2017-03-18 VITALS — BP 132/64 | HR 80 | Temp 98.7°F | Resp 16 | Ht 73.0 in | Wt 171.0 lb

## 2017-03-18 DIAGNOSIS — I10 Essential (primary) hypertension: Secondary | ICD-10-CM | POA: Diagnosis not present

## 2017-03-18 DIAGNOSIS — E785 Hyperlipidemia, unspecified: Secondary | ICD-10-CM | POA: Diagnosis not present

## 2017-03-18 DIAGNOSIS — G6289 Other specified polyneuropathies: Secondary | ICD-10-CM

## 2017-03-18 MED ORDER — SIMVASTATIN 40 MG PO TABS: 40.0000 mg | ORAL_TABLET | Freq: Every day | ORAL | 1 refills | Status: DC

## 2017-03-18 MED ORDER — HYDROCHLOROTHIAZIDE 25 MG PO TABS: 25.0000 mg | ORAL_TABLET | Freq: Every day | ORAL | 1 refills | Status: DC

## 2017-03-18 NOTE — Patient Instructions (Addendum)
  Since pain is not much different off the gabapentin and your neurology appointment is in 1 week, I am not restarting it today. They can decide if you need to be back on that medication (and what dose) now that you are taking Cymbalta.   Tylenol over the counter may be the safest for arthritis pain.  Some of your hand symptoms may be due to neuropathy, so discuss that with neurology at your upcoming appointment.   Recheck in next 6 months for a physical.   IF you received an x-ray today, you will receive an invoice from [facility] Rad[facility]. Please contact [facility] at [phone] with questions or concerns regarding your invoice.   IF you received labwork today, you will receive an invoice from [facility]. Please contact [facility] at [phone] with questions or concerns regarding your invoice.   Our billing staff will not be able to assist you with questions regarding bills from these companies.  You will be contacted with the lab results as soon as they are available. The fastest way to get your results is to activate your My Chart account. Instructions are located on the last page of this paperwork. If you have not heard from [place] regarding the results in 2 weeks, please contact this office.

## 2017-03-18 NOTE — Progress Notes (Signed)
By signing my name below, I, Mesha Guinyard, attest that this documentation has been prepared under the direction and in the presence of Merri Ray, MD.  Electronically Signed: Verlee Monte, Medical Scribe. 03/18/17. 5:58 PM.  Subjective:    Patient ID: Casey Fisher, male    DOB: [birth year], 83 y.o.   MRN: [id number]  HPI Chief Complaint  Patient presents with  . Medication Refill    zocor    HPI Comments: Casey Fisher is a 83 y.o. male who presents to the Primary Care at [facility] for follow-up. He was last seen June 17, 2016. He was treated for polyneuropathy with neurontin and continued on 600 mg of neurotin QD. Pt last ate around 2:30 pm and isn't fasting.  HTN: HCTZ 25 mg QD. Pt is compliant with HCTZ. Denies chest pain, light-headedness, dizziness, abdominal pain, or other side effects from HCTZ. Lab Results  Component Value Date   CREATININE 0.95 03/11/2016   HLD: Takes zocor 40 mg QD. Denies experiencing myalgias or other negative side effects. Lab Results  Component Value Date   CHOL 159 03/11/2016   HDL 61 03/11/2016   LDLCALC 84 03/11/2016   TRIG 68 03/11/2016   CHOLHDL 2.6 03/11/2016   Lab Results  Component Value Date   ALT 12 03/11/2016   AST 20 03/11/2016   ALKPHOS 64 03/11/2016   BILITOT 0.5 03/11/2016   Neuropathy: Followed by Dr. [name], last seen Sept 29, 2017 and was taking abstention TID. Pt no longer takes gabapentin since running out for the past +2 months and hasn't noticed a change in his foot pain since discontinuing gabapentin. Pt takes cymbalta 30 mg QD and it gives relief of his foot edema. Notes cymbalta initially gave him diarrhea but it has resolved now.  Arthritis: Located in his hands and feet. Pt uses peppermint oil for relief and a different sleeping position for relief of his sxs.  Patient Active Problem List   Diagnosis Date Noted  . Memory difficulty 03/24/2016  . Hereditary and idiopathic  peripheral neuropathy 03/24/2016  . Abnormality of gait 03/24/2016  . Peripheral neuropathy ([place]) 04/18/2015  . Hyperlipidemia 04/18/2015  . Prostate cancer ([place]) 08/24/2014  . Right inguinal hernia 10/30/2013   Past Medical History:  Diagnosis Date  . Abnormality of gait 03/24/2016  . Anxiety   . Cancer ([place])   . Cataract   . Depression   . Heart murmur   . Hereditary and idiopathic peripheral neuropathy 03/24/2016  . Hypertension   . Memory difficulty 03/24/2016  . Neuromuscular disorder ([place])   . Neuropathy ([place])   . Prostate cancer ([place])    remission   Past Surgical History:  Procedure Laterality Date  . CATARACT EXTRACTION Bilateral   . EYE SURGERY    . HERNIA REPAIR  1985  . INGUINAL HERNIA REPAIR Right 12/06/2013   Procedure: HERNIA REPAIR INGUINAL ADULT;  Surgeon: Merrie Roof, MD;  Location: [place];  Service: General;  Laterality: Right;  . INSERTION OF MESH Right 12/06/2013   Procedure: INSERTION OF MESH;  Surgeon: Merrie Roof, MD;  Location: [place];  Service: General;  Laterality: Right;  . PROSTATE SURGERY     Allergies  Allergen Reactions  . Lyrica [Pregabalin] Other (See Comments)    "constipation"   Prior to Admission medications   Medication Sig Start Date End Date Taking? Authorizing Provider  aspirin EC 81 MG tablet Take 81 mg by mouth daily. Reported  on 05/14/2016    Historical Provider, MD  Calcium Carb-Cholecalciferol (CALCIUM 600 + D PO) Take 1 tablet by mouth daily.    Historical Provider, MD  DULoxetine (CYMBALTA) 30 MG capsule take 1 capsule by mouth once daily 01/21/17   Kathrynn Ducking, MD  gabapentin (NEURONTIN) 600 MG tablet take 1 tablet by mouth three times a day 09/18/16   [name], MD  hydrochlorothiazide (HYDRODIURIL) 25 MG tablet take 1 tablet by mouth once daily 09/22/16   [name], MD  Leuprolide Acetate (LUPRON IJ) Inject 1 application as directed every 6 (six) months.    Historical Provider, MD  simvastatin (ZOCOR)  40 MG tablet take 1 tablet by mouth once daily 01/31/17   [name], MD  simvastatin (ZOCOR) 40 MG tablet take 1 tablet by mouth once daily 02/27/17   [name], MD  tamsulosin [facility]) 0.4 MG CAPS capsule Reported on 05/14/2016 08/17/15   Historical Provider, MD   Social History   Social History  . Marital status: Widowed    Spouse name: N/A  . Number of children: N/A  . Years of education: N/A   Occupational History  . Not on file.   Social History Main Topics  . Smoking status: Former Research scientist (life sciences)  . Smokeless tobacco: Not on file  . Alcohol use No  . Drug use: No  . Sexual activity: Not on file   Other Topics Concern  . Not on file   Social History Narrative   Lives home alone.  Retired.  Widowed.  % children.     Review of Systems  Constitutional: Negative for fatigue and unexpected weight change.  Eyes: Negative for visual disturbance.  Respiratory: Negative for cough, chest tightness and shortness of breath.   Cardiovascular: Negative for chest pain, palpitations and leg swelling.  Gastrointestinal: Negative for abdominal pain, blood in stool and diarrhea.  Musculoskeletal: Positive for arthralgias. Negative for joint swelling and myalgias.  Neurological: Negative for dizziness, light-headedness and headaches.   Objective:  Physical Exam  Constitutional: He is oriented to person, place, and time. He appears well-developed and well-nourished. No distress.  HENT:  Head: Normocephalic and atraumatic.  Eyes: Conjunctivae and EOM are normal. Pupils are equal, round, and reactive to light.  Neck: Neck supple. No JVD present. Carotid bruit is not present.  Cardiovascular: Normal rate, regular rhythm and normal heart sounds.  Exam reveals no gallop and no friction rub.   No murmur heard. Pulmonary/Chest: Effort normal and breath sounds normal. No respiratory distress. He has no wheezes. He has no rales.  Musculoskeletal: He exhibits no edema.  Neurological: He is  alert and oriented to person, place, and time.  Skin: Skin is warm and dry.  Psychiatric: He has a normal mood and affect. His behavior is normal.  Nursing note and vitals reviewed.   Vitals:   03/18/17 1644  BP: 132/64  Pulse: 80  Resp: 16  Temp: 98.7 F (37.1 C)  TempSrc: Oral  SpO2: 95%  Weight: 171 lb (77.6 kg)  Height: 6\' 1"  (1.854 m)   Body mass index is 22.56 kg/m. Assessment & Plan:   Casey Fisher is a 83 y.o. male Essential hypertension - Plan: Comprehensive metabolic panel, hydrochlorothiazide (HYDRODIURIL) 25 MG tablet  - .stable, continue hctz, check labs.   Hyperlipidemia, unspecified hyperlipidemia type - Plan: Comprehensive metabolic panel, Lipid panel, simvastatin (ZOCOR) 40 MG tablet  - check labs. Tolerating statin without side effects, so decided to continue at  this time. Cont zocor same dose.   Other polyneuropathy ([place])  - stable on just Cymbalta at this time. Would discuss neurontin and whether to restart (and dosing) at upcoming neuro appt.   Meds ordered this encounter  Medications  . simvastatin (ZOCOR) 40 MG tablet    Sig: Take 1 tablet (40 mg total) by mouth daily.    Dispense:  90 tablet    Refill:  1  . hydrochlorothiazide (HYDRODIURIL) 25 MG tablet    Sig: Take 1 tablet (25 mg total) by mouth daily.    Dispense:  90 tablet    Refill:  1   Patient Instructions    Since pain is not much different off the gabapentin and your neurology appointment is in 1 week, I am not restarting it today. They can decide if you need to be back on that medication (and what dose) now that you are taking Cymbalta.   Tylenol over the counter may be the safest for arthritis pain.  Some of your hand symptoms may be due to neuropathy, so discuss that with neurology at your upcoming appointment.   Recheck in next 6 months for a physical.   IF you received an x-ray today, you will receive an invoice from [facility] Radiology. Please contact [facility]  Radiology at [phone] with questions or concerns regarding your invoice.   IF you received labwork today, you will receive an invoice from [facility]. Please contact [facility] at [phone] with questions or concerns regarding your invoice.   Our billing staff will not be able to assist you with questions regarding bills from these companies.  You will be contacted with the lab results as soon as they are available. The fastest way to get your results is to activate your My Chart account. Instructions are located on the last page of this paperwork. If you have not heard from [place] regarding the results in 2 weeks, please contact this office.       I personally performed the services described in this documentation, which was scribed in my presence. The recorded information has been reviewed and considered for accuracy and completeness, addended by me as needed, and agree with information above.  Signed,   Merri Ray, MD Primary [facility] at [place].  03/20/17 11:36 PM

## 2017-03-20 LAB — SPECIMEN STATUS

## 2017-03-22 ENCOUNTER — Other Ambulatory Visit: Payer: Self-pay | Admitting: Family Medicine

## 2017-03-22 DIAGNOSIS — E875 Hyperkalemia: Secondary | ICD-10-CM

## 2017-03-22 LAB — COMPREHENSIVE METABOLIC PANEL
ALBUMIN: 4.5 g/dL (ref 3.5–4.7)
ALT: 7 IU/L (ref 0–44)
AST: 20 IU/L (ref 0–40)
Albumin/Globulin Ratio: 1.5 (ref 1.2–2.2)
Alkaline Phosphatase: 80 IU/L (ref 39–117)
BUN/Creatinine Ratio: 17 (ref 10–24)
BUN: 16 mg/dL (ref 8–27)
Bilirubin Total: 0.3 mg/dL (ref 0.0–1.2)
CALCIUM: 9.4 mg/dL (ref 8.6–10.2)
CO2: 28 mmol/L (ref 18–29)
CREATININE: 0.96 mg/dL (ref 0.76–1.27)
Chloride: 100 mmol/L (ref 96–106)
GFR calc Af Amer: 82 mL/min/{1.73_m2} (ref >59)
GFR, EST NON AFRICAN AMERICAN: 71 mL/min/{1.73_m2} (ref >59)
GLOBULIN, TOTAL: 3.1 g/dL (ref 1.5–4.5)
Glucose: 82 mg/dL (ref 65–99)
Potassium: 5.5 mmol/L — ABNORMAL HIGH (ref 3.5–5.2)
SODIUM: 141 mmol/L (ref 134–144)
Total Protein: 7.6 g/dL (ref 6.0–8.5)

## 2017-03-22 LAB — LIPID PANEL
CHOL/HDL RATIO: 3.2 ratio (ref 0.0–5.0)
Cholesterol, Total: 199 mg/dL (ref 100–199)
HDL: 63 mg/dL (ref >39)
LDL CALC: 123 mg/dL — AB (ref 0–99)
TRIGLYCERIDES: 65 mg/dL (ref 0–149)
VLDL Cholesterol Cal: 13 mg/dL (ref 5–40)

## 2017-03-25 ENCOUNTER — Ambulatory Visit (INDEPENDENT_AMBULATORY_CARE_PROVIDER_SITE_OTHER): Payer: Medicare Other | Admitting: Nurse Practitioner

## 2017-03-25 ENCOUNTER — Encounter: Payer: Self-pay | Admitting: Nurse Practitioner

## 2017-03-25 VITALS — BP 142/73 | HR 72 | Ht 73.0 in | Wt 171.2 lb

## 2017-03-25 DIAGNOSIS — R269 Unspecified abnormalities of gait and mobility: Secondary | ICD-10-CM

## 2017-03-25 DIAGNOSIS — R413 Other amnesia: Secondary | ICD-10-CM

## 2017-03-25 DIAGNOSIS — G609 Hereditary and idiopathic neuropathy, unspecified: Secondary | ICD-10-CM | POA: Diagnosis not present

## 2017-03-25 NOTE — Progress Notes (Signed)
I have read the note, and I agree with the clinical assessment and plan.  WILLIS,[name] KEITH   

## 2017-03-25 NOTE — Progress Notes (Signed)
[facility]  PATIENT: Casey Fisher DOB: May 01, 1928   REASON FOR VISIT: follow up for peripheral neuropathy, mild memory disturbance HISTORY FROM: Patient alone at visit     HISTORY OF PRESENT ILLNESS:UPDATE 03/25/17 CM Casey Fisher is an 83 year old right-handed black male with a history of a peripheral neuropathy and a mild memory disturbance. The patient has been on gabapentin taking 600 mg 3 times daily for the neuropathy, but he recently stopped the medication over a month and states his symptoms are no worse therefore he is going to stay off the medication for now. In addition he is on Cymbalta which she takes every day.  He continues to live alone but family checks on him frequently. He reports his appetite is good and he sleeps well at night. The patient uses a cane for ambulation, he reports no falls. He has been on Lyrica in the past but this caused a lot of constipation. 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 The patient has had some mild memory problems, but he believes that this has not progressed since last seen. His daughter is his power of attorney, she manages the finances. The patient keeps up with his own medications and appointments. He still operates a Teacher, music without difficulty. He denies getting lost or being  involved in accidents He returns for an evaluation.    REVIEW OF SYSTEMS: Full 14 system review of systems performed and notable only for those listed, all others are neg:  Constitutional: neg  Cardiovascular: neg Ear/Nose/Throat: neg  Skin: neg Eyes: neg Respiratory: neg Gastroitestinal: neg  Hematology/Lymphatic: neg  Endocrine: neg Musculoskeletal:neg Allergy/Immunology: neg Neurological: neg Psychiatric: neg Sleep : neg   ALLERGIES: Allergies  Allergen Reactions  . Lyrica [Pregabalin] Other (See Comments)    "constipation"    HOME MEDICATIONS: Outpatient Medications Prior to Visit   Medication Sig Dispense Refill  . aspirin EC 81 MG tablet Take 81 mg by mouth daily. Reported on 05/14/2016    . Calcium Carb-Cholecalciferol (CALCIUM 600 + D PO) Take 1 tablet by mouth daily.    . DULoxetine (CYMBALTA) 30 MG capsule take 1 capsule by mouth once daily 30 capsule 11  . gabapentin (NEURONTIN) 600 MG tablet take 1 tablet by mouth three times a day 270 tablet 0  . hydrochlorothiazide (HYDRODIURIL) 25 MG tablet Take 1 tablet (25 mg total) by mouth daily. 90 tablet 1  . Leuprolide Acetate (LUPRON IJ) Inject 1 application as directed every 6 (six) months.    . simvastatin (ZOCOR) 40 MG tablet Take 1 tablet (40 mg total) by mouth daily. 90 tablet 1  . tamsulosin (FLOMAX) 0.4 MG CAPS capsule Reported on 05/14/2016  1   No facility-administered medications prior to visit.     PAST MEDICAL HISTORY: Past Medical History:  Diagnosis Date  . Abnormality of gait 03/24/2016  . Anxiety   . Cancer ([place])   . Cataract   . Depression   . Heart murmur   . Hereditary and idiopathic peripheral neuropathy 03/24/2016  . Hypertension   . Memory difficulty 03/24/2016  . Neuromuscular disorder ([place])   . Neuropathy ([place])   . Prostate cancer ([place])    remission    PAST SURGICAL HISTORY: Past Surgical History:  Procedure Laterality Date  . CATARACT EXTRACTION Bilateral   . EYE SURGERY    . HERNIA REPAIR  1985  . INGUINAL HERNIA REPAIR Right 12/06/2013   Procedure: HERNIA REPAIR INGUINAL ADULT;  Surgeon: Merrie Roof, MD;  Location: [facility];  Service: General;  Laterality: Right;  . INSERTION OF MESH Right 12/06/2013   Procedure: INSERTION OF MESH;  Surgeon: Merrie Roof, MD;  Location: [facility];  Service: General;  Laterality: Right;  . PROSTATE SURGERY      FAMILY HISTORY: Family History  Problem Relation Age of Onset  . Brain cancer Daughter   . Heart disease Mother   . Stroke Mother   . Heart disease Father     SOCIAL HISTORY: Social History   Social History  . Marital  status: Widowed    Spouse name: N/A  . Number of children: N/A  . Years of education: N/A   Occupational History  . Not on file.   Social History Main Topics  . Smoking status: Former Research scientist (life sciences)  . Smokeless tobacco: Never Used  . Alcohol use No  . Drug use: No  . Sexual activity: Not on file   Other Topics Concern  . Not on file   Social History Narrative   Lives home alone.  Retired.  Widowed.  % children.       PHYSICAL EXAM  Vitals:   03/25/17 0902  BP: (!) 142/73  Pulse: 72  Weight: 171 lb 3.2 oz (77.7 kg)  Height: 6\' 1"  (1.854 m)   Body mass index is 22.59 kg/m.  Generalized: Well developed, in no acute distress  Head: normocephalic and atraumatic,. Oropharynx benign  Neck: Supple, Musculoskeletal: No deformity   Neurological examination   Mentation: Alert  MMSE - Mini Mental State Exam 03/25/2017 09/25/2016 03/24/2016  Orientation to time 5 5 5   Orientation to Place 4 5 5   Registration 3 3 3   Attention/ Calculation 3 3 1   Recall 1 1 1   Language- name 2 objects 2 2 2   Language- repeat 1 0 1  Language- follow 3 step command 3 3 2   Language- read & follow direction 1 1 1   Write a sentence 1 1 1   Copy design 1 1 1   Total score 25 25 23   AFT 9. Clock drawing 4/4.  Follows all commands speech and language fluent.   Cranial nerve II-XII: Pupils were equal round reactive to light extraocular movements were full, visual field were full on confrontational test. Facial sensation and strength were normal. hearing was intact to finger rubbing bilaterally. Uvula tongue midline. head turning and shoulder shrug were normal and symmetric.Tongue protrusion into cheek strength was normal. Motor: normal bulk and tone, full strength in the BUE, BLE, arthritic changes in hands  Sensory: normal and symmetric to light touch, on the face ,arms and legs Coordination: finger-nose-finger, heel-to-shin bilaterally, no dysmetria Reflexes: depressed and symmetric upper and lower,  plantar responses were flexor bilaterally. Gait and Station: Rising up from seated position with push off, short shuffling gait pattern. Ambulates with single-point cane   DIAGNOSTIC DATA (LABS, IMAGING, TESTING) - I reviewed patient records, labs, notes, testing and imaging myself where available.  Lab Results  Component Value Date   WBC WILL FOLLOW 03/18/2017   HGB 12.4 (A) 04/06/2014   HCT WILL FOLLOW 03/18/2017   MCV WILL FOLLOW 03/18/2017   PLT WILL FOLLOW 03/18/2017      Component Value Date/Time   NA 141 03/18/2017 1822   K 5.5 (H) 03/18/2017 1822   CL 100 03/18/2017 1822   CO2 28 03/18/2017 1822   GLUCOSE 82 03/18/2017 1822   GLUCOSE 97 03/11/2016 1407   BUN 16 03/18/2017 1822  CREATININE 0.96 03/18/2017 1822   CREATININE 0.95 03/11/2016 1407   CALCIUM 9.4 03/18/2017 1822   PROT 7.6 03/18/2017 1822   ALBUMIN 4.5 03/18/2017 1822   AST 20 03/18/2017 1822   ALT 7 03/18/2017 1822   ALKPHOS 80 03/18/2017 1822   BILITOT 0.3 03/18/2017 1822   GFRNONAA 71 03/18/2017 1822   GFRNONAA 72 03/11/2016 1407   GFRAA 82 03/18/2017 1822   GFRAA 83 03/11/2016 1407   Lab Results  Component Value Date   CHOL 199 03/18/2017   HDL 63 03/18/2017   LDLCALC 123 (H) 03/18/2017   TRIG 65 03/18/2017   CHOLHDL 3.2 03/18/2017    ASSESSMENT AND PLAN  83 y.o. year old male  has a medical history of Abnormality of gait idiopathic peripheral neuropathy (03/24/2016);  Memory disturbance here to follow up.   PLAN: Continue Cymbalta  at current dose Patient has been off gabapentin for a month with no change in neuropathy symptoms. Will discontinue.  Memory score is stable, patient does not wish to go on any memory medication at this time. Use  cane at all times for ambulating at risk for falls Follow-up in 8 months I spent 25 min  in total face to face time with the patient more than 50% of which was spent counseling and coordination of care, reviewing recent labs,  test results reviewing  medications and discussing and reviewing the diagnosis of peripheral neuropathy and his gait disturbance. Report any new symptoms such as increased tingling or pain in the legs, any increased weakness or falls any clumsiness or loss of balance not being able to control her bladder. Dennie Bible, [facility], [facility], APRN  [facility] [address], [place], [place] [address] [phone]

## 2017-03-25 NOTE — Patient Instructions (Addendum)
Continue Cymbalta  at current dose Patient has been off gabapentin for a month with no change in neuropathy symptoms. Will discontinue Memory score is stable, patient does not wish to go on any memory medication at this time. Use  cane at all times for ambulating  Follow-up in 8 months  Peripheral Neuropathy Peripheral neuropathy is a type of nerve damage. It affects nerves that carry signals between the spinal cord and other parts of the body. These are called peripheral nerves. With peripheral neuropathy, one nerve or a group of nerves may be damaged. What are the causes? Many things can damage peripheral nerves. For some people with peripheral neuropathy, the cause is unknown. Some causes include:  Diabetes. This is the most common cause of peripheral neuropathy.  Injury to a nerve.  Pressure or stress on a nerve that lasts a long time.  Too little vitamin B. Alcoholism can lead to this.  Infections.  Autoimmune diseases, such as multiple sclerosis and systemic lupus erythematosus.  Inherited nerve diseases.  Some medicines, such as cancer drugs.  Toxic substances, such as lead and mercury.  Too little blood flowing to the legs.  Kidney disease.  Thyroid disease. What are the signs or symptoms? Different people have different symptoms. The symptoms you have will depend on which of your nerves is damaged. Common symptoms include:  Loss of feeling (numbness) in the feet and hands.  Tingling in the feet and hands.  Pain that burns.  Very sensitive skin.  Weakness.  Not being able to move a part of the body (paralysis).  Muscle twitching.  Clumsiness or poor coordination.  Loss of balance.  Not being able to control your bladder.  Feeling dizzy.  Sexual problems. How is this diagnosed? Peripheral neuropathy is a symptom, not a disease. Finding the cause of peripheral neuropathy can be hard. To figure that out, your health care provider will take a medical  history and do a physical exam. A neurological exam will also be done. This involves checking things affected by your brain, spinal cord, and nerves (nervous system). For example, your health care provider will check your reflexes, how you move, and what you can feel. Other types of tests may also be ordered, such as:  Blood tests.  A test of the fluid in your spinal cord.  Imaging tests, such as CT scans or an MRI.  Electromyography (EMG). This test checks the nerves that control muscles.  Nerve conduction velocity tests. These tests check how fast messages pass through your nerves.  Nerve biopsy. A small piece of nerve is removed. It is then checked under a microscope. How is this treated?  Medicine is often used to treat peripheral neuropathy. Medicines may include:  Pain-relieving medicines. Prescription or over-the-counter medicine may be suggested.  Antiseizure medicine. This may be used for pain.  Antidepressants. These also may help ease pain from neuropathy.  Lidocaine. This is a numbing medicine. You might wear a patch or be given a shot.  Mexiletine. This medicine is typically used to help control irregular heart rhythms.  Surgery. Surgery may be needed to relieve pressure on a nerve or to destroy a nerve that is causing pain.  Physical therapy to help movement.  Assistive devices to help movement. Follow these instructions at home:  Only take over-the-counter or prescription medicines as directed by your health care provider. Follow the instructions carefully for any given medicines. Do not take any other medicines without first getting approval from your health care  provider.  If you have diabetes, work closely with your health care provider to keep your blood sugar under control.  If you have numbness in your feet:  Check every day for signs of injury or infection. Watch for redness, warmth, and swelling.  Wear padded socks and comfortable shoes. These help  protect your feet.  Do not do things that put pressure on your damaged nerve.  Do not smoke. Smoking keeps blood from getting to damaged nerves.  Avoid or limit alcohol. Too much alcohol can cause a lack of B vitamins. These vitamins are needed for healthy nerves.  Develop a good support system. Coping with peripheral neuropathy can be stressful. Talk to a mental health specialist or join a support group if you are struggling.  Follow up with your health care provider as directed. Contact a health care provider if:  You have new signs or symptoms of peripheral neuropathy.  You are struggling emotionally from dealing with peripheral neuropathy.  You have a fever. Get help right away if:  You have an injury or infection that is not healing.  You feel very dizzy or begin vomiting.  You have chest pain.  You have trouble breathing. This information is not intended to replace advice given to you by your health care provider. Make sure you discuss any questions you have with your health care provider. Document Released: 12/04/2002 Document Revised: 05/21/2016 Document Reviewed: 08/21/2013 Elsevier Interactive Patient Education  2017 [employer].

## 2017-03-29 ENCOUNTER — Other Ambulatory Visit (INDEPENDENT_AMBULATORY_CARE_PROVIDER_SITE_OTHER): Payer: Medicare Other | Admitting: Family Medicine

## 2017-03-29 DIAGNOSIS — E875 Hyperkalemia: Secondary | ICD-10-CM

## 2017-03-29 NOTE — Progress Notes (Signed)
Lab visit only. 

## 2017-03-30 LAB — BASIC METABOLIC PANEL
BUN / CREAT RATIO: 16 (ref 10–24)
BUN: 15 mg/dL (ref 8–27)
CO2: 26 mmol/L (ref 18–29)
Calcium: 9 mg/dL (ref 8.6–10.2)
Chloride: 100 mmol/L (ref 96–106)
Creatinine, Ser: 0.94 mg/dL (ref 0.76–1.27)
GFR, EST AFRICAN AMERICAN: 84 mL/min/{1.73_m2} (ref >59)
GFR, EST NON AFRICAN AMERICAN: 73 mL/min/{1.73_m2} (ref >59)
Glucose: 138 mg/dL — ABNORMAL HIGH (ref 65–99)
Potassium: 3.7 mmol/L (ref 3.5–5.2)
Sodium: 142 mmol/L (ref 134–144)

## 2017-04-13 ENCOUNTER — Ambulatory Visit: Payer: Medicare Other | Admitting: Sports Medicine

## 2017-04-14 ENCOUNTER — Encounter: Payer: Self-pay | Admitting: Radiology

## 2017-05-31 DIAGNOSIS — N5201 Erectile dysfunction due to arterial insufficiency: Secondary | ICD-10-CM | POA: Diagnosis not present

## 2017-05-31 DIAGNOSIS — N35012 Post-traumatic membranous urethral stricture: Secondary | ICD-10-CM | POA: Diagnosis not present

## 2017-05-31 DIAGNOSIS — C61 Malignant neoplasm of prostate: Secondary | ICD-10-CM | POA: Diagnosis not present

## 2017-06-08 ENCOUNTER — Ambulatory Visit (INDEPENDENT_AMBULATORY_CARE_PROVIDER_SITE_OTHER): Payer: Medicare Other | Admitting: Sports Medicine

## 2017-06-08 ENCOUNTER — Encounter: Payer: Self-pay | Admitting: Sports Medicine

## 2017-06-08 DIAGNOSIS — M79672 Pain in left foot: Secondary | ICD-10-CM

## 2017-06-08 DIAGNOSIS — B351 Tinea unguium: Secondary | ICD-10-CM | POA: Diagnosis not present

## 2017-06-08 DIAGNOSIS — M79671 Pain in right foot: Secondary | ICD-10-CM

## 2017-06-08 DIAGNOSIS — G629 Polyneuropathy, unspecified: Secondary | ICD-10-CM

## 2017-06-08 NOTE — Progress Notes (Signed)
Patient ID: Casey Fisher, male   DOB: 01/06/1928, 83 y.o.   MRN: [id number]  Subjective: Casey Fisher is a 83 y.o. male patient seen today in office with complaint of painful thickened and elongated toenails; unable to trim. Patient denies any medication changes and has no other pedal complaints at this time.    Patient Active Problem List   Diagnosis Date Noted  . Memory difficulty 03/24/2016  . Hereditary and idiopathic peripheral neuropathy 03/24/2016  . Abnormality of gait 03/24/2016  . Peripheral neuropathy 04/18/2015  . Hyperlipidemia 04/18/2015  . Prostate cancer ([facility]) 08/24/2014  . Right inguinal hernia 10/30/2013    Current Outpatient Prescriptions on File Prior to Visit  Medication Sig Dispense Refill  . aspirin EC 81 MG tablet Take 81 mg by mouth daily. Reported on 05/14/2016    . Calcium Carb-Cholecalciferol (CALCIUM 600 + D PO) Take 1 tablet by mouth daily.    . DULoxetine (CYMBALTA) 30 MG capsule take 1 capsule by mouth once daily 30 capsule 11  . hydrochlorothiazide (HYDRODIURIL) 25 MG tablet Take 1 tablet (25 mg total) by mouth daily. 90 tablet 1  . Leuprolide Acetate (LUPRON IJ) Inject 1 application as directed every 6 (six) months.    . simvastatin (ZOCOR) 40 MG tablet Take 1 tablet (40 mg total) by mouth daily. 90 tablet 1  . tamsulosin (FLOMAX) 0.4 MG CAPS capsule Reported on 05/14/2016  1   No current facility-administered medications on file prior to visit.     Allergies  Allergen Reactions  . Lyrica [Pregabalin] Other (See Comments)    "constipation"    Objective: Physical Exam  General: Well developed, nourished, no acute distress, awake, alert and oriented x 3  Vascular: Dorsalis pedis artery 2/4 bilateral, Posterior tibial artery 1/4 bilateral, skin temperature warm to warm proximal to distal bilateral lower extremities, + varicosities, Decreased pedal hair present bilateral.  Neurological: Gross sensation present via light touch bilateral.  Protective and vibratory sensation decreased bilateral.   Dermatological: Skin is warm, dry, and supple bilateral, Nails 1-10 are tender, long, thick, and discolored with mild subungal debris, no webspace macerations present bilateral, no open lesions present bilateral, no callus/corns/hyperkeratotic tissue present bilateral. No signs of infection bilateral.  Musculoskeletal:  Asymptomatic bunion and hammertoe boney deformities noted bilateral. Muscular strength within normal limits without painon range of motion. No pain with calf compression bilateral.  Assessment and Plan:  Problem List Items Addressed This Visit    None    Visit Diagnoses    Dermatophytosis of nail    -  Primary   Foot pain, bilateral       Neuropathy         -Examined patient.  -Discussed treatment options for painful mycotic nails. -Mechanically debrided and reduced mycotic nails with sterile nail nipper and dremel nail file without incident. -Recommend continue with daily foot inspection in the setting of neuropathy; continue with neuro recs and cymbalta  -Patient to return in 3 months for follow up evaluation or sooner if symptoms worsen.  Clemma Johnsen, DPM   

## 2017-06-16 DIAGNOSIS — C61 Malignant neoplasm of prostate: Secondary | ICD-10-CM | POA: Diagnosis not present

## 2017-08-13 DIAGNOSIS — R350 Frequency of micturition: Secondary | ICD-10-CM | POA: Diagnosis not present

## 2017-08-13 DIAGNOSIS — R8271 Bacteriuria: Secondary | ICD-10-CM | POA: Diagnosis not present

## 2017-08-20 DIAGNOSIS — R3914 Feeling of incomplete bladder emptying: Secondary | ICD-10-CM | POA: Diagnosis not present

## 2017-09-14 ENCOUNTER — Ambulatory Visit (INDEPENDENT_AMBULATORY_CARE_PROVIDER_SITE_OTHER): Payer: Medicare Other | Admitting: Sports Medicine

## 2017-09-14 ENCOUNTER — Encounter: Payer: Self-pay | Admitting: Sports Medicine

## 2017-09-14 DIAGNOSIS — G629 Polyneuropathy, unspecified: Secondary | ICD-10-CM

## 2017-09-14 DIAGNOSIS — B351 Tinea unguium: Secondary | ICD-10-CM

## 2017-09-14 DIAGNOSIS — M79672 Pain in left foot: Secondary | ICD-10-CM

## 2017-09-14 DIAGNOSIS — M79671 Pain in right foot: Secondary | ICD-10-CM | POA: Diagnosis not present

## 2017-09-14 NOTE — Progress Notes (Signed)
Patient ID: Casey Fisher, male   DOB: March 24, 1928, 83 y.o.   MRN: [id number]  Subjective: Casey Fisher is a 83 y.o. male patient seen today in office with complaint of painful thickened and elongated toenails; unable to trim. Patient denies any medication changes and has no other pedal complaints at this time.    Patient Active Problem List   Diagnosis Date Noted  . Memory difficulty 03/24/2016  . Hereditary and idiopathic peripheral neuropathy 03/24/2016  . Abnormality of gait 03/24/2016  . Peripheral neuropathy 04/18/2015  . Hyperlipidemia 04/18/2015  . Prostate cancer ([place]) 08/24/2014  . Right inguinal hernia 10/30/2013    Current Outpatient Prescriptions on File Prior to Visit  Medication Sig Dispense Refill  . aspirin EC 81 MG tablet Take 81 mg by mouth daily. Reported on 05/14/2016    . Calcium Carb-Cholecalciferol (CALCIUM 600 + D PO) Take 1 tablet by mouth daily.    . DULoxetine (CYMBALTA) 30 MG capsule take 1 capsule by mouth once daily 30 capsule 11  . hydrochlorothiazide (HYDRODIURIL) 25 MG tablet Take 1 tablet (25 mg total) by mouth daily. 90 tablet 1  . Leuprolide Acetate (LUPRON IJ) Inject 1 application as directed every 6 (six) months.    . simvastatin (ZOCOR) 40 MG tablet Take 1 tablet (40 mg total) by mouth daily. 90 tablet 1  . tamsulosin (FLOMAX) 0.4 MG CAPS capsule Reported on 05/14/2016  1   No current facility-administered medications on file prior to visit.     Allergies  Allergen Reactions  . Lyrica [Pregabalin] Other (See Comments)    "constipation"    Objective: Physical Exam  General: Well developed, nourished, no acute distress, awake, alert and oriented x 3  Vascular: Dorsalis pedis artery 2/4 bilateral, Posterior tibial artery 1/4 bilateral, skin temperature warm to warm proximal to distal bilateral lower extremities, + varicosities, Decreased pedal hair present bilateral.  Neurological: Gross sensation present via light touch bilateral.  Protective and vibratory sensation decreased bilateral.   Dermatological: Skin is warm, dry, and supple bilateral, Nails 1-10 are tender, long, thick, and discolored with mild subungal debris, no webspace macerations present bilateral, no open lesions present bilateral, no callus/corns/hyperkeratotic tissue present bilateral. No signs of infection bilateral.  Musculoskeletal:  Asymptomatic bunion and hammertoe boney deformities noted bilateral. Muscular strength within normal limits without painon range of motion. No pain with calf compression bilateral.  Assessment and Plan:  Problem List Items Addressed This Visit    None    Visit Diagnoses    Dermatophytosis of nail    -  Primary   Foot pain, bilateral       Neuropathy         -Examined patient.  -Discussed treatment options for painful mycotic nails. -Mechanically debrided and reduced mycotic nails with sterile nail nipper and dremel nail file without incident. -Recommend continue with daily foot inspection in the setting of neuropathy; continue with neuro recs and cymbalta  -Patient to return in 3 months for follow up evaluation or sooner if symptoms worsen.  Landis Martins, DPM

## 2017-09-16 ENCOUNTER — Ambulatory Visit (INDEPENDENT_AMBULATORY_CARE_PROVIDER_SITE_OTHER): Payer: Medicare Other

## 2017-09-16 ENCOUNTER — Ambulatory Visit (INDEPENDENT_AMBULATORY_CARE_PROVIDER_SITE_OTHER): Payer: Medicare Other | Admitting: Family Medicine

## 2017-09-16 ENCOUNTER — Encounter: Payer: Self-pay | Admitting: Family Medicine

## 2017-09-16 VITALS — BP 128/63 | HR 60 | Temp 98.0°F | Resp 16 | Ht 71.26 in | Wt 151.0 lb

## 2017-09-16 DIAGNOSIS — Z87891 Personal history of nicotine dependence: Secondary | ICD-10-CM

## 2017-09-16 DIAGNOSIS — Z0000 Encounter for general adult medical examination without abnormal findings: Secondary | ICD-10-CM | POA: Diagnosis not present

## 2017-09-16 DIAGNOSIS — Z23 Encounter for immunization: Secondary | ICD-10-CM

## 2017-09-16 DIAGNOSIS — Z130 Encounter for screening for diseases of the blood and blood-forming organs and certain disorders involving the immune mechanism: Secondary | ICD-10-CM | POA: Diagnosis not present

## 2017-09-16 DIAGNOSIS — C61 Malignant neoplasm of prostate: Secondary | ICD-10-CM | POA: Diagnosis not present

## 2017-09-16 DIAGNOSIS — G609 Hereditary and idiopathic neuropathy, unspecified: Secondary | ICD-10-CM | POA: Diagnosis not present

## 2017-09-16 DIAGNOSIS — R634 Abnormal weight loss: Secondary | ICD-10-CM

## 2017-09-16 DIAGNOSIS — Z1211 Encounter for screening for malignant neoplasm of colon: Secondary | ICD-10-CM

## 2017-09-16 LAB — IFOBT (OCCULT BLOOD): IMMUNOLOGICAL FECAL OCCULT BLOOD TEST: NEGATIVE

## 2017-09-16 NOTE — Progress Notes (Signed)
Subjective:  By signing my name below, I, [name], attest that this documentation has been prepared under the direction and in the presence of Merri Ray, MD. Electronically Signed: [name], [place]. 09/16/2017 , 9:57 AM .  Patient was seen in Room 10 .   Patient ID: Casey Fisher, male    DOB: [birth year], 83 y.o.   MRN: [id number] Chief Complaint  Patient presents with  . Annual Exam   HPI Casey Fisher is a 83 y.o. male Here for annual physical, and also complains of multiple other concerns today. Patient has a history of prostate cancer, peripheral neuropathy, and hyperlipidemia.   Peripheral neuropathy and mild memory disturbance He was previously treated with gabapentin; stopped medication with no change in symptoms based on March progress note with neurology. He had mild memory problems but no change in March. His daughter is his power of attorney and manages his finances. Patient is able to operate vehicle, keep up with his own medication and appointments; no MVC's based on neuro note. Medication was deferred for memory. He uses a cane to reduce risk of falls. He was also on low dose of cymbalta 30mg  QD to help with pain.    HTN He was controlled at last visit on HCTZ 25mg  QD.   Hyperlipidemia He is on zocor 40mg  QD.   Lab Results  Component Value Date   CHOL 199 03/18/2017   HDL 63 03/18/2017   LDLCALC 123 (H) 03/18/2017   TRIG 65 03/18/2017   CHOLHDL 3.2 03/18/2017   Lab Results  Component Value Date   ALT 7 03/18/2017   AST 20 03/18/2017   ALKPHOS 80 03/18/2017   BILITOT 0.3 03/18/2017    Arthritis  He reported various arthralgias in the past, treated with peppermint oil in his hands and feet. He hasn't had much relief with the peppermint oils. He has tried tylenol in the past. He notes worse in his right hand.   He notes his feet hurt throughout the day, and improves when sleeping. He is still taking Cymbalta. He notes gabapentin  didn't improve much. He took Lyrica in the past but made him constipated. He mentions taking 4 fiber supplement pills a day to help with constipation. He reports decreased bowel movement this morning. He tries to drink plenty of water throughout the day.   Weight changes Wt Readings from Last 3 Encounters:  09/16/17 151 lb (68.5 kg)  03/25/17 171 lb 3.2 oz (77.7 kg)  03/18/17 171 lb (77.6 kg)   He's had a 20 lb weight loss since March. He states his weight has decreased for past 8-9 months. He reports eating about the same. He notes having some abdominal pain, but improves with constipation improving. He denies night sweats, cough, trouble breathing or shortness of breath. He denies currently smoking; quit about 40 years ago.   He's been drinking Boost along with eating. He states he's doing well living alone at home.   Difficulty urinating He was diagnosed with prostate cancer in 1999. He had seed implant and has received Lupron. He is followed by Dr. Junious Silk with [facility]. His last visit with urology was on June 5th. He was having some difficulty initiating urination at that time. He was on a study drug, and planned to continue Lupron. Plan for repeat visit in 6 months.   Patient states he's still on the medication, which improves his emptying. He called his urologist and was prescribed Rapaflo. He's having some urination difficulty  today as he hasn't taken his medication this morning.   Cancer Screening Colonoscopy: he doesn't recall when this was last done.  Prostate cancer screening: see above.   Immunizations Immunization History  Administered Date(s) Administered  . Influenza,inj,Quad PF,6+ Mos 12/18/2013, 11/08/2014, 12/04/2015, 11/03/2016  . Pneumococcal Conjugate-13 04/18/2015  . Pneumococcal Polysaccharide-23 07/15/2015   Flu shot: receives flu shot today.   Fall screening: He denies any falls in the past year- does use cane for stability as above. He mentions not  using his cane around his house that much. He denies any lightheadedness or dizziness.   Depression Depression screen [facility] 2/9 09/16/2017 03/18/2017 06/17/2016 05/14/2016 03/11/2016  Decreased Interest 0 0 0 0 0  Down, Depressed, Hopeless 0 0 0 0 0  PHQ - 2 Score 0 0 0 0 0     Vision  Visual Acuity Screening   Right eye Left eye Both eyes  Without correction: 20/30 20/30 20/30   With correction:      He hasn't seen his eye doctor this year; usually receives a call or notice for his appointment.   Dentist He has dentures.   Exercise He exercises most days of the week.   Functional Status Survey: Is the patient deaf or have difficulty hearing?: No Does the patient have difficulty seeing, even when wearing glasses/contacts?: No Does the patient have difficulty concentrating, remembering, or making decisions?: No Does the patient have difficulty walking or climbing stairs?: Yes Does the patient have difficulty dressing or bathing?: No Does the patient have difficulty doing errands alone such as visiting a doctor's office or shopping?: No  Advanced Directives He has a living will and healthcare power of attorney; copy requested.   Patient Active Problem List   Diagnosis Date Noted  . Memory difficulty 03/24/2016  . Hereditary and idiopathic peripheral neuropathy 03/24/2016  . Abnormality of gait 03/24/2016  . Peripheral neuropathy 04/18/2015  . Hyperlipidemia 04/18/2015  . Prostate cancer ([place]) 08/24/2014  . Right inguinal hernia 10/30/2013   Past Medical History:  Diagnosis Date  . Abnormality of gait 03/24/2016  . Anxiety   . Cancer ([place])   . Cataract   . Depression   . Heart murmur   . Hereditary and idiopathic peripheral neuropathy 03/24/2016  . Hypertension   . Memory difficulty 03/24/2016  . Neuromuscular disorder ([place])   . Neuropathy   . Prostate cancer ([place])    remission   Past Surgical History:  Procedure Laterality Date  . CATARACT EXTRACTION Bilateral   .  EYE SURGERY    . HERNIA REPAIR  1985  . INGUINAL HERNIA REPAIR Right 12/06/2013   Procedure: HERNIA REPAIR INGUINAL ADULT;  Surgeon: Merrie Roof, MD;  Location: [place];  Service: General;  Laterality: Right;  . INSERTION OF MESH Right 12/06/2013   Procedure: INSERTION OF MESH;  Surgeon: Merrie Roof, MD;  Location: [place];  Service: General;  Laterality: Right;  . PROSTATE SURGERY     Allergies  Allergen Reactions  . Lyrica [Pregabalin] Other (See Comments)    "constipation"   Prior to Admission medications   Medication Sig Start Date End Date Taking? Authorizing Provider  aspirin EC 81 MG tablet Take 81 mg by mouth daily. Reported on 05/14/2016    [provider]  Calcium Carb-Cholecalciferol (CALCIUM 600 + D PO) Take 1 tablet by mouth daily.    [provider]  DULoxetine (CYMBALTA) 30 MG capsule take 1 capsule by mouth once daily 01/21/17  Kathrynn Ducking, MD  hydrochlorothiazide (HYDRODIURIL) 25 MG tablet Take 1 tablet (25 mg total) by mouth daily. 03/18/17   [name], MD  Leuprolide Acetate (LUPRON IJ) Inject 1 application as directed every 6 (six) months.    [provider]  RAPAFLO 8 MG CAPS capsule  09/08/17   [provider]  simvastatin (ZOCOR) 40 MG tablet Take 1 tablet (40 mg total) by mouth daily. 03/18/17   [name], MD  tamsulosin [facility]) 0.4 MG CAPS capsule Reported on 05/14/2016 08/17/15   [provider]   Social History   Social History  . Marital status: Widowed    Spouse name: N/A  . Number of children: N/A  . Years of education: N/A   Occupational History  . Not on file.   Social History Main Topics  . Smoking status: Former Research scientist (life sciences)  . Smokeless tobacco: Never Used  . Alcohol use No  . Drug use: No  . Sexual activity: Not on file   Other Topics Concern  . Not on file   Social History Narrative   Lives home alone.  Retired.  Widowed.  % children.     Review of Systems 13 point ROS -  see nursing note above.  Negative otherwise.     Objective:   Physical Exam  Constitutional: He is oriented to person, place, and time. He appears well-developed and well-nourished.  HENT:  Head: Normocephalic and atraumatic.  Right Ear: External ear normal.  Left Ear: External ear normal.  Mouth/Throat: Oropharynx is clear and moist.  Eyes: Pupils are equal, round, and reactive to light. Conjunctivae and EOM are normal.  Neck: Normal range of motion. Neck supple. No thyromegaly present.  Cardiovascular: Normal rate, regular rhythm, normal heart sounds and intact distal pulses.  Exam reveals no gallop and no friction rub.   No murmur heard. Pulmonary/Chest: Effort normal and breath sounds normal. No respiratory distress. He has no wheezes.  Abdominal: Soft. He exhibits no distension. There is no tenderness.  Musculoskeletal: Normal range of motion. He exhibits no edema or tenderness.  Lymphadenopathy:    He has no cervical adenopathy.  Neurological: He is alert and oriented to person, place, and time. He has normal reflexes.  Skin: Skin is warm and dry.  Hyperpigmentation lower legs  Psychiatric: He has a normal mood and affect. His behavior is normal.  Vitals reviewed.   Vitals:   09/16/17 0857  BP: 128/63  Pulse: 60  Resp: 16  Temp: 98 F (36.7 C)  SpO2: 96%  Weight: 151 lb (68.5 kg)  Height: 5' 11.26" (1.81 m)   Results for orders placed or performed in visit on 09/16/17  IFOBT POC (occult bld, rslt in office)  Result Value Ref Range   IFOBT Negative    Dg Chest 2 View  Result Date: 09/16/2017 CLINICAL DATA:  Weight loss EXAM: CHEST  2 VIEW COMPARISON:  Chest CT 10/29/2016 FINDINGS: Mild hyperinflation of the lungs. Heart and mediastinal contours are within normal limits. No focal opacities or effusions. No acute bony abnormality. IMPRESSION: Hyperinflation/COPD.  No active cardiopulmonary disease. Electronically Signed   By: Rolm Baptise M.D.   On: 09/16/2017 10:30       Assessment & Plan:   Casey Fisher is a 83 y.o. male Medicare annual wellness visit, subsequent  - anticipatory guidance as below in AVS, screening labs if needed. Health maintenance items as above in HPI discussed/recommended as applicable.   - no concerning responses on  depression, fall, or functional status screening. Any positive responses noted as above. Advanced directives discussed as in CH[facility].   Need for prophylactic vaccination and inoculation against influenza - Plan: Flu Vaccine QUAD 36+ mos IM  Loss of weight - Plan: CBC, TSH, Comprehensive metabolic panel, DG Chest 2 View, IFOBT POC (occult bld, rslt in office), IFOBT POC (occult bld, rslt in office) History of tobacco use - Plan: DG Chest 2 View Screening, anemia, deficiency, iron - Plan: CBC Screen for colon cancer - Plan: IFOBT POC (occult bld, rslt in office), IFOBT POC (occult bld, rslt in office)  - Heme-negative stool testing, chest x-ray without apparent nodules. Check CBC, TSH, CMP, and return to discuss further next 2 weeks.  Hereditary and idiopathic peripheral neuropathy - Plan: CBC, TSH, Comprehensive metabolic panel  -Continue Cymbalta, option of restarting gabapentin was discussed. Continue routine follow-up with neurology  Prostate cancer [facility])  -Has ongoing care with urology. May need to coordinate care with urologist or repeat PSA if other sources of weight loss not found   No orders of the defined types were placed in this encounter.  Patient Instructions   For urinary symptoms, please call your urologist if any worsening difficulty urinating to see if other changes in meds are needed.   Ok to try tylenol for aches and pains in hands up to a few times per day for aches and pains. If any new or more sore areas - please return to discuss those areas.   For foot pain, continue cymbalta.  You can try restarting gabapentin, or call neurology to discuss other options for that pain.   For weight  loss, I will check some bloodwork, but would like to see you in follow up in next 2 weeks to discuss this further.   Return to the clinic or go to the nearest emergency room if any of your symptoms worsen or new symptoms occur.  Continue fiber supplement, and stool softener such as Colace may also be helpful for constipation. See more information below.   Constipation, Adult Constipation is when a person has fewer bowel movements in a week than normal, has difficulty having a bowel movement, or has stools that are dry, hard, or larger than normal. Constipation may be caused by an underlying condition. It may become worse with age if a person takes certain medicines and does not take in enough fluids. Follow these instructions at home: Eating and drinking   Eat foods that have a lot of fiber, such as fresh fruits and vegetables, whole grains, and beans.  Limit foods that are high in fat, low in fiber, or overly processed, such as french fries, hamburgers, cookies, candies, and soda.  Drink enough fluid to keep your urine clear or pale yellow. General instructions  Exercise regularly or as told by your health care provider.  Go to the restroom when you have the urge to go. Do not hold it in.  Take over-the-counter and prescription medicines only as told by your health care provider. These include any fiber supplements.  Practice pelvic floor retraining exercises, such as deep breathing while relaxing the lower abdomen and pelvic floor relaxation during bowel movements.  Watch your condition for any changes.  Keep all follow-up visits as told by your health care provider. This is important. Contact a health care provider if:  You have pain that gets worse.  You have a fever.  You do not have a bowel movement after 4 days.  You vomit.  You are not hungry.  You lose weight.  You are bleeding from the anus.  You have thin, pencil-like stools. Get help right away if:  You  have a fever and your symptoms suddenly get worse.  You leak stool or have blood in your stool.  Your abdomen is bloated.  You have severe pain in your abdomen.  You feel dizzy or you faint. This information is not intended to replace advice given to you by your health care provider. Make sure you discuss any questions you have with your health care provider. Document Released: 09/11/2004 Document Revised: 07/03/2016 Document Reviewed: 06/03/2016 Elsevier Interactive Patient Education  2017 [place] you healthy  Get these tests  Blood pressure- Have your blood pressure checked once a year by your healthcare provider.  Normal blood pressure is 120/80  Weight- Have your body mass index (BMI) calculated to screen for obesity.  BMI is a measure of body fat based on height and weight. You can also calculate your own BMI at ViewBanking.si.  Cholesterol- Have your cholesterol checked every year.  Diabetes- Have your blood sugar checked regularly if you have high blood pressure, high cholesterol, have a family history of diabetes or if you are overweight.  Screening for Colon Cancer- Colonoscopy starting at age 99.  Screening may begin sooner depending on your family history and other health conditions. Follow up colonoscopy as directed by your Gastroenterologist.  Screening for Prostate Cancer- Both blood work (PSA) and a rectal exam help screen for Prostate Cancer.  Screening begins at age 60 with African-American men and at age 63 with Caucasian men.  Screening may begin sooner depending on your family history.  Take these medicines  Aspirin- One aspirin daily can help prevent Heart disease and Stroke.  Flu shot- Every fall.  Tetanus- Every 10 years.  Zostavax- Once after the age of 20 to prevent Shingles.  Pneumonia shot- Once after the age of 51; if you are younger than 33, ask your healthcare provider if you need a Pneumonia shot.  Take these  steps  Don't smoke- If you do smoke, talk to your doctor about quitting.  For tips on how to quit, go to www.smokefree.gov or call 1-800-QUIT-NOW.  Be physically active- Exercise 5 days a week for at least 30 minutes.  If you are not already physically active start slow and gradually work up to 30 minutes of moderate physical activity.  Examples of moderate activity include walking briskly, mowing the yard, dancing, swimming, bicycling, etc.  Eat a healthy diet- Eat a variety of healthy food such as fruits, vegetables, low fat milk, low fat cheese, yogurt, lean meant, poultry, fish, beans, tofu, etc. For more information go to www.thenutritionsource.org  Drink alcohol in moderation- Limit alcohol intake to less than two drinks a day. Never drink and drive.  Dentist- Brush and floss twice daily; visit your dentist twice a year.  Depression- Your emotional health is as important as your physical health. If you're feeling down, or losing interest in things you would normally enjoy please talk to your healthcare provider.  Eye exam- Visit your eye doctor every year.  Safe sex- If you may be exposed to a sexually transmitted infection, use a condom.  Seat belts- Seat belts can save your life; always wear one.  Smoke/Carbon Monoxide detectors- These detectors need to be installed on the appropriate level of your home.  Replace batteries at least once a year.  Skin cancer- When out in  the sun, cover up and use sunscreen 15 SPF or higher.  Violence- If anyone is threatening you, please tell your healthcare provider.  Living Will/ Health care power of attorney- Speak with your healthcare provider and family.    IF you received an x-ray today, you will receive an invoice from [facility]. Please contact [facility] at [phone] with questions or concerns regarding your invoice.   IF you received labwork today, you will receive an invoice from [place]. Please contact [facility]  at [phone] with questions or concerns regarding your invoice.   Our billing staff will not be able to assist you with questions regarding bills from these companies.  You will be contacted with the lab results as soon as they are available. The fastest way to get your results is to activate your My Chart account. Instructions are located on the last page of this paperwork. If you have not heard from [place] regarding the results in 2 weeks, please contact this office.       I personally performed the services described in this documentation, which was scribed in my presence. The recorded information has been reviewed and considered for accuracy and completeness, addended by me as needed, and agree with information above.  Signed,   Merri Ray, MD [facility].  09/19/17 12:09 PM

## 2017-09-16 NOTE — Progress Notes (Signed)
   Subjective:    Patient ID: Casey Fisher, male    DOB: 07/05/1928, 83 y.o.   MRN: [id number]  HPI    Review of Systems  Constitutional: Positive for unexpected weight change.  Gastrointestinal: Positive for constipation.  Genitourinary: Positive for difficulty urinating.  Musculoskeletal: Positive for arthralgias.  Neurological: Positive for light-headedness.       Objective:   Physical Exam        Assessment & Plan:

## 2017-09-16 NOTE — Patient Instructions (Addendum)
For urinary symptoms, please call your urologist if any worsening difficulty urinating to see if other changes in meds are needed.   Ok to try tylenol for aches and pains in hands up to a few times per day for aches and pains. If any new or more sore areas - please return to discuss those areas.   For foot pain, continue cymbalta.  You can try restarting gabapentin, or call neurology to discuss other options for that pain.   For weight loss, I will check some bloodwork, but would like to see you in follow up in next 2 weeks to discuss this further.   Return to the clinic or go to the nearest emergency room if any of your symptoms worsen or new symptoms occur.  Continue fiber supplement, and stool softener such as Colace may also be helpful for constipation. See more information below.   Constipation, Adult Constipation is when a person has fewer bowel movements in a week than normal, has difficulty having a bowel movement, or has stools that are dry, hard, or larger than normal. Constipation may be caused by an underlying condition. It may become worse with age if a person takes certain medicines and does not take in enough fluids. Follow these instructions at home: Eating and drinking   Eat foods that have a lot of fiber, such as fresh fruits and vegetables, whole grains, and beans.  Limit foods that are high in fat, low in fiber, or overly processed, such as french fries, hamburgers, cookies, candies, and soda.  Drink enough fluid to keep your urine clear or pale yellow. General instructions  Exercise regularly or as told by your health care provider.  Go to the restroom when you have the urge to go. Do not hold it in.  Take over-the-counter and prescription medicines only as told by your health care provider. These include any fiber supplements.  Practice pelvic floor retraining exercises, such as deep breathing while relaxing the lower abdomen and pelvic floor relaxation during  bowel movements.  Watch your condition for any changes.  Keep all follow-up visits as told by your health care provider. This is important. Contact a health care provider if:  You have pain that gets worse.  You have a fever.  You do not have a bowel movement after 4 days.  You vomit.  You are not hungry.  You lose weight.  You are bleeding from the anus.  You have thin, pencil-like stools. Get help right away if:  You have a fever and your symptoms suddenly get worse.  You leak stool or have blood in your stool.  Your abdomen is bloated.  You have severe pain in your abdomen.  You feel dizzy or you faint. This information is not intended to replace advice given to you by your health care provider. Make sure you discuss any questions you have with your health care provider. Document Released: 09/11/2004 Document Revised: 07/03/2016 Document Reviewed: 06/03/2016 Elsevier Interactive Patient Education  2017 [place] you healthy  Get these tests  Blood pressure- Have your blood pressure checked once a year by your healthcare provider.  Normal blood pressure is 120/80  Weight- Have your body mass index (BMI) calculated to screen for obesity.  BMI is a measure of body fat based on height and weight. You can also calculate your own BMI at ViewBanking.si.  Cholesterol- Have your cholesterol checked every year.  Diabetes- Have your blood sugar checked regularly if you  have high blood pressure, high cholesterol, have a family history of diabetes or if you are overweight.  Screening for Colon Cancer- Colonoscopy starting at age 16.  Screening may begin sooner depending on your family history and other health conditions. Follow up colonoscopy as directed by your Gastroenterologist.  Screening for Prostate Cancer- Both blood work (PSA) and a rectal exam help screen for Prostate Cancer.  Screening begins at age 58 with African-American men and at age  97 with Caucasian men.  Screening may begin sooner depending on your family history.  Take these medicines  Aspirin- One aspirin daily can help prevent Heart disease and Stroke.  Flu shot- Every fall.  Tetanus- Every 10 years.  Zostavax- Once after the age of 43 to prevent Shingles.  Pneumonia shot- Once after the age of 70; if you are younger than 29, ask your healthcare provider if you need a Pneumonia shot.  Take these steps  Don't smoke- If you do smoke, talk to your doctor about quitting.  For tips on how to quit, go to www.smokefree.gov or call 1-800-QUIT-NOW.  Be physically active- Exercise 5 days a week for at least 30 minutes.  If you are not already physically active start slow and gradually work up to 30 minutes of moderate physical activity.  Examples of moderate activity include walking briskly, mowing the yard, dancing, swimming, bicycling, etc.  Eat a healthy diet- Eat a variety of healthy food such as fruits, vegetables, low fat milk, low fat cheese, yogurt, lean meant, poultry, fish, beans, tofu, etc. For more information go to www.thenutritionsource.org  Drink alcohol in moderation- Limit alcohol intake to less than two drinks a day. Never drink and drive.  Dentist- Brush and floss twice daily; visit your dentist twice a year.  Depression- Your emotional health is as important as your physical health. If you're feeling down, or losing interest in things you would normally enjoy please talk to your healthcare provider.  Eye exam- Visit your eye doctor every year.  Safe sex- If you may be exposed to a sexually transmitted infection, use a condom.  Seat belts- Seat belts can save your life; always wear one.  Smoke/Carbon Monoxide detectors- These detectors need to be installed on the appropriate level of your home.  Replace batteries at least once a year.  Skin cancer- When out in the sun, cover up and use sunscreen 15 SPF or higher.  Violence- If anyone is  threatening you, please tell your healthcare provider.  Living Will/ Health care power of attorney- Speak with your healthcare provider and family.    IF you received an x-ray today, you will receive an invoice from [facility]. Please contact [facility] at [phone] with questions or concerns regarding your invoice.   IF you received labwork today, you will receive an invoice from [place]. Please contact [facility] at [phone] with questions or concerns regarding your invoice.   Our billing staff will not be able to assist you with questions regarding bills from these companies.  You will be contacted with the lab results as soon as they are available. The fastest way to get your results is to activate your My Chart account. Instructions are located on the last page of this paperwork. If you have not heard from [place] regarding the results in 2 weeks, please contact this office.

## 2017-09-17 LAB — COMPREHENSIVE METABOLIC PANEL
ALK PHOS: 72 IU/L (ref 39–117)
ALT: 9 IU/L (ref 0–44)
AST: 18 IU/L (ref 0–40)
Albumin/Globulin Ratio: 1.5 (ref 1.2–2.2)
Albumin: 4.6 g/dL (ref 3.5–4.7)
BUN/Creatinine Ratio: 17 (ref 10–24)
BUN: 15 mg/dL (ref 8–27)
Bilirubin Total: 0.6 mg/dL (ref 0.0–1.2)
CO2: 27 mmol/L (ref 20–29)
Calcium: 9.7 mg/dL (ref 8.6–10.2)
Chloride: 101 mmol/L (ref 96–106)
Creatinine, Ser: 0.89 mg/dL (ref 0.76–1.27)
GFR calc non Af Amer: 77 mL/min/{1.73_m2} (ref >59)
GFR, EST AFRICAN AMERICAN: 89 mL/min/{1.73_m2} (ref >59)
GLUCOSE: 97 mg/dL (ref 65–99)
Globulin, Total: 3.1 g/dL (ref 1.5–4.5)
Potassium: 3.5 mmol/L (ref 3.5–5.2)
Sodium: 146 mmol/L — ABNORMAL HIGH (ref 134–144)
TOTAL PROTEIN: 7.7 g/dL (ref 6.0–8.5)

## 2017-09-17 LAB — CBC
HEMOGLOBIN: 12.7 g/dL — AB (ref 13.0–17.7)
Hematocrit: 39 % (ref 37.5–51.0)
MCH: 28 pg (ref 26.6–33.0)
MCHC: 32.6 g/dL (ref 31.5–35.7)
MCV: 86 fL (ref 79–97)
Platelets: 182 10*3/uL (ref 150–379)
RBC: 4.53 x10E6/uL (ref 4.14–5.80)
RDW: 13.7 % (ref 12.3–15.4)
WBC: 4.2 10*3/uL (ref 3.4–10.8)

## 2017-09-17 LAB — TSH: TSH: 2.35 u[IU]/mL (ref 0.450–4.500)

## 2017-09-20 ENCOUNTER — Other Ambulatory Visit: Payer: Self-pay | Admitting: Family Medicine

## 2017-09-20 DIAGNOSIS — I10 Essential (primary) hypertension: Secondary | ICD-10-CM

## 2017-09-20 DIAGNOSIS — E785 Hyperlipidemia, unspecified: Secondary | ICD-10-CM

## 2017-09-30 ENCOUNTER — Ambulatory Visit (INDEPENDENT_AMBULATORY_CARE_PROVIDER_SITE_OTHER): Payer: Medicare Other | Admitting: Family Medicine

## 2017-09-30 ENCOUNTER — Encounter: Payer: Self-pay | Admitting: Family Medicine

## 2017-09-30 VITALS — BP 120/78 | HR 66 | Temp 98.4°F | Resp 16 | Ht 71.26 in | Wt 156.8 lb

## 2017-09-30 DIAGNOSIS — E870 Hyperosmolality and hypernatremia: Secondary | ICD-10-CM | POA: Diagnosis not present

## 2017-09-30 DIAGNOSIS — R634 Abnormal weight loss: Secondary | ICD-10-CM

## 2017-09-30 DIAGNOSIS — G629 Polyneuropathy, unspecified: Secondary | ICD-10-CM

## 2017-09-30 MED ORDER — GABAPENTIN 600 MG PO TABS: 600.0000 mg | ORAL_TABLET | Freq: Three times a day (TID) | ORAL | 2 refills | Status: DC

## 2017-09-30 NOTE — Progress Notes (Signed)
Subjective:  By signing my name below, I, Casey Fisher, attest that this documentation has been prepared under the direction and in the presence of Casey Ray, MD. Electronically Signed: [name], [place]. 09/30/2017 , 10:26 AM .  Patient was seen in Room 12 .   Patient ID: Casey Fisher, male    DOB: 09-26-1928, 83 y.o.   MRN: [id number] Chief Complaint  Patient presents with  . Follow-up    weight loss, discuss walking program at [facility]    HPI Casey Fisher is a 83 y.o. male Here for follow up. I last saw him on Sept 20th for Medicare annual wellness exam with multiple other concerns.   Weight loss Wt Readings from Last 3 Encounters:  09/30/17 156 lb 12.8 oz (71.1 kg)  09/16/17 151 lb (68.5 kg)  03/25/17 171 lb 3.2 oz (77.7 kg)   He denied night sweats, cough or new pulmonary symptoms. He quit smoking 40 years prior. He does have a history of prostate cancer in 1999, treated with seed implant and Lupron. He is followed by urology, Casey Fisher with [facility]. It appears he is on a study drug. He was seen on June 5th by urology. He's using Rapaflo for urinary difficulties. He had negative stool testing for Fisher, and negative chest xray for mass at previous visit.   He had borderline low CBC with hemoglobin at 12.7, and borderline sodium at 146; his other Fisher tests including thyroid looked normal at last visit.   He states he's been eating more regularly currently. He realized he was eating differently with a friend, but now eating 3 regular meals a day with 1 Boost drink daily. He also has snacks throughout the day if needed.   Peripheral neuropathy He states he has pain in his feet with walking. He's been taking tylenol with little relief. He was previously taking gabapentin 600mg  TID, but came off because he states it didn't seem like it was improving. He denies any side effects with gabapentin. He denies any weakness.   Patient Active Problem  List   Diagnosis Date Noted  . Memory difficulty 03/24/2016  . Hereditary and idiopathic peripheral neuropathy 03/24/2016  . Abnormality of gait 03/24/2016  . Peripheral neuropathy 04/18/2015  . Hyperlipidemia 04/18/2015  . Prostate cancer ([place]) 08/24/2014  . Right inguinal hernia 10/30/2013   Past Medical History:  Diagnosis Date  . Abnormality of gait 03/24/2016  . Anxiety   . Cancer ([place])   . Cataract   . Depression   . Heart murmur   . Hereditary and idiopathic peripheral neuropathy 03/24/2016  . Hypertension   . Memory difficulty 03/24/2016  . Neuromuscular disorder ([place])   . Neuropathy   . Prostate cancer ([place])    remission   Past Surgical History:  Procedure Laterality Date  . CATARACT EXTRACTION Bilateral   . EYE SURGERY    . HERNIA REPAIR  1985  . INGUINAL HERNIA REPAIR Right 12/06/2013   Procedure: HERNIA REPAIR INGUINAL ADULT;  Surgeon: Casey Roof, MD;  Location: [place];  Service: General;  Laterality: Right;  . INSERTION OF MESH Right 12/06/2013   Procedure: INSERTION OF MESH;  Surgeon: Casey Roof, MD;  Location: [place];  Service: General;  Laterality: Right;  . PROSTATE SURGERY     Allergies  Allergen Reactions  . Lyrica [Pregabalin] Other (See Comments)    "constipation"   Prior to Admission medications   Medication Sig Start Date End Date  Taking? Authorizing Provider  aspirin EC 81 MG tablet Take 81 mg by mouth daily. Reported on 05/14/2016   Yes [provider]  Calcium Carb-Cholecalciferol (CALCIUM 600 + D PO) Take 1 tablet by mouth daily.   Yes [provider]  DULoxetine (CYMBALTA) 30 MG capsule take 1 capsule by mouth once daily 01/21/17  Yes Kathrynn Ducking, MD  hydrochlorothiazide (HYDRODIURIL) 25 MG tablet take 1 tablet by mouth once daily 09/24/17  Yes [name], MD  Leuprolide Acetate (LUPRON IJ) Inject 1 application as directed every 6 (six) months.   Yes [provider]  RAPAFLO 8 MG CAPS capsule   09/08/17  Yes [provider]  simvastatin (ZOCOR) 40 MG tablet take 1 tablet by mouth once daily 09/24/17  Yes [name], MD  tamsulosin [facility]) 0.4 MG CAPS capsule Reported on 05/14/2016 08/17/15  Yes [provider]   Social History   Social History  . Marital status: Widowed    Spouse name: N/A  . Number of children: N/A  . Years of education: N/A   Occupational History  . Not on file.   Social History Main Topics  . Smoking status: Former Research scientist (life sciences)  . Smokeless tobacco: Never Used  . Alcohol use No  . Drug use: No  . Sexual activity: Not on file   Other Topics Concern  . Not on file   Social History Narrative   Lives home alone.  Retired.  Widowed.  % children.     Review of Systems  Constitutional: Negative for fatigue and unexpected weight change.  Eyes: Negative for visual disturbance.  Respiratory: Negative for cough, chest tightness and shortness of breath.   Cardiovascular: Negative for chest pain, palpitations and leg swelling.  Gastrointestinal: Negative for abdominal pain and Fisher in stool.  Musculoskeletal: Positive for arthralgias.  Neurological: Negative for dizziness, weakness, light-headedness and headaches.       Objective:   Physical Exam  Constitutional: He is oriented to person, place, and time. He appears well-developed and well-nourished.  HENT:  Head: Normocephalic and atraumatic.  Eyes: Pupils are equal, round, and reactive to light. EOM are normal.  Neck: No JVD present. Carotid bruit is not present.  Cardiovascular: Normal rate, regular rhythm and normal heart sounds.   No murmur heard. Pulmonary/Chest: Effort normal and breath sounds normal. He has no rales.  Musculoskeletal: He exhibits no edema.  No lower extremity edema  Neurological: He is alert and oriented to person, place, and time.  Skin: Skin is warm and dry.  Psychiatric: He has a normal mood and affect.  Vitals reviewed.   Vitals:   09/30/17 0924    BP: 120/78  Pulse: 66  Resp: 16  Temp: 98.4 F (36.9 C)  SpO2: 98%  Weight: 156 lb 12.8 oz (71.1 kg)  Height: 5' 11.26" (1.81 m)      Assessment & Plan:   Casey Fisher is a 83 y.o. male Peripheral polyneuropathy  - Persistent foot pain. He would like to try to restart gabapentin to see if that may make a difference at this point. Restart daily, then increase to twice a day after 1 week, then return to 3 times a day dosing. Denies side effects with prior dosing at that level. If persistent foot pain or gabapentin, can discuss with neurology other options, such as Lyrica. rtc precautions.   Loss of weight  - Now improving. May have been due to diet change. Reassuring chest x-Fisher, heme-negative stool less  visit, overall labs are reassuring. Recheck weight in 3 months. Sooner if worsening.  Hypernatremia - Plan: Basic metabolic panel  -Borderline. Repeat BMP.  Meds ordered this encounter  Medications  . gabapentin (NEURONTIN) 600 MG tablet    Sig: Take 1 tablet (600 mg total) by mouth 3 (three) times daily. Initially 1 po QD for 1 week, then 1 po BID for 1 week, then 1 po TID.    Dispense:  90 tablet    Refill:  2   Patient Instructions    Weight is improving. No further workup for weight at this time, but recheck in 3 months.  Try restarting the gabapentin - once at night for next 1 week, then twice per day for 1 week,  then three times per day. If foot pain is not improving with this dose, would recommend discussing other options with neurology.    IF you received an x-Fisher today, you will receive an invoice from [facility]. Please contact [facility] at [phone] with questions or concerns regarding your invoice.   IF you received labwork today, you will receive an invoice from [place]. Please contact [facility] at [phone] with questions or concerns regarding your invoice.   Our billing staff will not be able to assist you with questions  regarding bills from these companies.  You will be contacted with the lab results as soon as they are available. The fastest way to get your results is to activate your My Chart account. Instructions are located on the last page of this paperwork. If you have not heard from [place] regarding the results in 2 weeks, please contact this office.       I personally performed the services described in this documentation, which was scribed in my presence. The recorded information has been reviewed and considered for accuracy and completeness, addended by me as needed, and agree with information above.  Signed,   Casey Ray, MD [facility].  09/30/17 10:33 AM

## 2017-09-30 NOTE — Patient Instructions (Addendum)
  Weight is improving. No further workup for weight at this time, but recheck in 3 months.  Try restarting the gabapentin - once at night for next 1 week, then twice per day for 1 week,  then three times per day. If foot pain is not improving with this dose, would recommend discussing other options with neurology.    IF you received an x-ray today, you will receive an invoice from [facility]. Please contact [facility] at [phone] with questions or concerns regarding your invoice.   IF you received labwork today, you will receive an invoice from [place]. Please contact [facility] at [phone] with questions or concerns regarding your invoice.   Our billing staff will not be able to assist you with questions regarding bills from these companies.  You will be contacted with the lab results as soon as they are available. The fastest way to get your results is to activate your My Chart account. Instructions are located on the last page of this paperwork. If you have not heard from [place] regarding the results in 2 weeks, please contact this office.

## 2017-10-01 LAB — BASIC METABOLIC PANEL
BUN/Creatinine Ratio: 25 — ABNORMAL HIGH (ref 10–24)
BUN: 16 mg/dL (ref 8–27)
CALCIUM: 9.7 mg/dL (ref 8.6–10.2)
CHLORIDE: 103 mmol/L (ref 96–106)
CO2: 29 mmol/L (ref 20–29)
CREATININE: 0.63 mg/dL — AB (ref 0.76–1.27)
GFR, EST AFRICAN AMERICAN: 102 mL/min/{1.73_m2} (ref >59)
GFR, EST NON AFRICAN AMERICAN: 89 mL/min/{1.73_m2} (ref >59)
Glucose: 94 mg/dL (ref 65–99)
Potassium: 4.3 mmol/L (ref 3.5–5.2)
Sodium: 144 mmol/L (ref 134–144)

## 2017-10-12 DIAGNOSIS — C61 Malignant neoplasm of prostate: Secondary | ICD-10-CM | POA: Diagnosis not present

## 2017-10-12 DIAGNOSIS — R3914 Feeling of incomplete bladder emptying: Secondary | ICD-10-CM | POA: Diagnosis not present

## 2017-10-13 ENCOUNTER — Encounter: Payer: Self-pay | Admitting: Radiology

## 2017-11-08 ENCOUNTER — Encounter (HOSPITAL_COMMUNITY): Payer: Self-pay | Admitting: Emergency Medicine

## 2017-11-08 ENCOUNTER — Observation Stay (HOSPITAL_BASED_OUTPATIENT_CLINIC_OR_DEPARTMENT_OTHER): Payer: Medicare Other

## 2017-11-08 ENCOUNTER — Observation Stay (HOSPITAL_COMMUNITY)
Admission: EM | Admit: 2017-11-08 | Discharge: 2017-11-08 | Disposition: A | Discharge status: Home / Self Care | Payer: Medicare Other | Attending: Internal Medicine | Admitting: Internal Medicine

## 2017-11-08 ENCOUNTER — Other Ambulatory Visit: Payer: Self-pay

## 2017-11-08 ENCOUNTER — Emergency Department (HOSPITAL_COMMUNITY): Payer: Medicare Other

## 2017-11-08 ENCOUNTER — Other Ambulatory Visit (HOSPITAL_COMMUNITY): Payer: Self-pay

## 2017-11-08 DIAGNOSIS — Z79899 Other long term (current) drug therapy: Secondary | ICD-10-CM | POA: Insufficient documentation

## 2017-11-08 DIAGNOSIS — I2510 Atherosclerotic heart disease of native coronary artery without angina pectoris: Secondary | ICD-10-CM | POA: Insufficient documentation

## 2017-11-08 DIAGNOSIS — E876 Hypokalemia: Secondary | ICD-10-CM | POA: Insufficient documentation

## 2017-11-08 DIAGNOSIS — Z87891 Personal history of nicotine dependence: Secondary | ICD-10-CM | POA: Diagnosis not present

## 2017-11-08 DIAGNOSIS — I10 Essential (primary) hypertension: Secondary | ICD-10-CM | POA: Diagnosis not present

## 2017-11-08 DIAGNOSIS — I361 Nonrheumatic tricuspid (valve) insufficiency: Secondary | ICD-10-CM | POA: Diagnosis not present

## 2017-11-08 DIAGNOSIS — N400 Enlarged prostate without lower urinary tract symptoms: Secondary | ICD-10-CM | POA: Diagnosis not present

## 2017-11-08 DIAGNOSIS — R0789 Other chest pain: Principal | ICD-10-CM

## 2017-11-08 DIAGNOSIS — Z8546 Personal history of malignant neoplasm of prostate: Secondary | ICD-10-CM | POA: Insufficient documentation

## 2017-11-08 DIAGNOSIS — R7989 Other specified abnormal findings of blood chemistry: Secondary | ICD-10-CM

## 2017-11-08 DIAGNOSIS — R600 Localized edema: Secondary | ICD-10-CM | POA: Diagnosis not present

## 2017-11-08 DIAGNOSIS — R079 Chest pain, unspecified: Secondary | ICD-10-CM | POA: Diagnosis not present

## 2017-11-08 DIAGNOSIS — I351 Nonrheumatic aortic (valve) insufficiency: Secondary | ICD-10-CM

## 2017-11-08 LAB — CBC WITH DIFFERENTIAL/PLATELET
Basophils Absolute: 0 10*3/uL (ref 0.0–0.1)
Basophils Relative: 0 %
Eosinophils Absolute: 0.1 10*3/uL (ref 0.0–0.7)
Eosinophils Relative: 2 %
HEMATOCRIT: 38.5 % — AB (ref 39.0–52.0)
Hemoglobin: 12.1 g/dL — ABNORMAL LOW (ref 13.0–17.0)
LYMPHS ABS: 2.2 10*3/uL (ref 0.7–4.0)
LYMPHS PCT: 41 %
MCH: 27.9 pg (ref 26.0–34.0)
MCHC: 31.4 g/dL (ref 30.0–36.0)
MCV: 88.7 fL (ref 78.0–100.0)
MONOS PCT: 6 %
Monocytes Absolute: 0.3 10*3/uL (ref 0.1–1.0)
NEUTROS ABS: 2.8 10*3/uL (ref 1.7–7.7)
NEUTROS PCT: 51 %
Platelets: 171 10*3/uL (ref 150–400)
RBC: 4.34 MIL/uL (ref 4.22–5.81)
RDW: 14.5 % (ref 11.5–15.5)
WBC: 5.4 10*3/uL (ref 4.0–10.5)

## 2017-11-08 LAB — LIPID PANEL
Cholesterol: 139 mg/dL (ref 0–200)
HDL: 60 mg/dL (ref >40)
LDL CALC: 73 mg/dL (ref 0–99)
TRIGLYCERIDES: 29 mg/dL (ref <150)
Total CHOL/HDL Ratio: 2.3 RATIO
VLDL: 6 mg/dL (ref 0–40)

## 2017-11-08 LAB — I-STAT TROPONIN, ED: Troponin i, poc: 0 ng/mL (ref 0.00–0.08)

## 2017-11-08 LAB — BASIC METABOLIC PANEL
Anion gap: 7 (ref 5–15)
BUN: 18 mg/dL (ref 6–20)
CHLORIDE: 103 mmol/L (ref 101–111)
CO2: 29 mmol/L (ref 22–32)
Calcium: 9.2 mg/dL (ref 8.9–10.3)
Creatinine, Ser: 0.85 mg/dL (ref 0.61–1.24)
GFR calc Af Amer: >60 mL/min (ref >60)
GFR calc non Af Amer: >60 mL/min (ref >60)
Glucose, Bld: 97 mg/dL (ref 65–99)
POTASSIUM: 3.3 mmol/L — AB (ref 3.5–5.1)
SODIUM: 139 mmol/L (ref 135–145)

## 2017-11-08 LAB — ECHOCARDIOGRAM COMPLETE
Height: 75 in
WEIGHTICAEL: 2740.76 [oz_av]

## 2017-11-08 LAB — TROPONIN I

## 2017-11-08 LAB — BRAIN NATRIURETIC PEPTIDE: B NATRIURETIC PEPTIDE 5: 145.4 pg/mL — AB (ref 0.0–100.0)

## 2017-11-08 LAB — TSH: TSH: 4.442 u[IU]/mL (ref 0.350–4.500)

## 2017-11-08 LAB — MAGNESIUM: Magnesium: 1.9 mg/dL (ref 1.7–2.4)

## 2017-11-08 MED ORDER — HYDROCHLOROTHIAZIDE 25 MG PO TABS
25.0000 mg | ORAL_TABLET | Freq: Every day | ORAL | Status: DC
  Administered 2017-11-08: 25 mg via ORAL
  Filled 2017-11-08: qty 1

## 2017-11-08 MED ORDER — ACETAMINOPHEN 325 MG PO TABS: 650.0000 mg | ORAL_TABLET | ORAL | Status: DC | PRN

## 2017-11-08 MED ORDER — ASPIRIN EC 81 MG PO TBEC
81.0000 mg | DELAYED_RELEASE_TABLET | Freq: Every day | ORAL | Status: DC
Start: 2017-11-09 — End: 2017-11-08
  Filled 2017-11-08: qty 1

## 2017-11-08 MED ORDER — ONDANSETRON HCL 4 MG/2ML IJ SOLN: 4.0000 mg | Freq: Four times a day (QID) | INTRAMUSCULAR | Status: DC | PRN

## 2017-11-08 MED ORDER — POTASSIUM CHLORIDE CRYS ER 20 MEQ PO TBCR
40.0000 meq | EXTENDED_RELEASE_TABLET | ORAL | Status: AC
  Administered 2017-11-08: 40 meq via ORAL
  Filled 2017-11-08: qty 2

## 2017-11-08 MED ORDER — ENOXAPARIN SODIUM 40 MG/0.4ML ~~LOC~~ SOLN
40.0000 mg | Freq: Every day | SUBCUTANEOUS | Status: DC
  Administered 2017-11-08: 40 mg via SUBCUTANEOUS
  Filled 2017-11-08: qty 0.4

## 2017-11-08 MED ORDER — MORPHINE SULFATE (PF) 4 MG/ML IV SOLN: 2.0000 mg | INTRAVENOUS | Status: DC | PRN

## 2017-11-08 MED ORDER — GI COCKTAIL ~~LOC~~: 30.0000 mL | Freq: Four times a day (QID) | ORAL | Status: DC | PRN

## 2017-11-08 MED ORDER — GABAPENTIN 600 MG PO TABS
600.0000 mg | ORAL_TABLET | Freq: Three times a day (TID) | ORAL | Status: DC
  Administered 2017-11-08 (×2): 600 mg via ORAL
  Filled 2017-11-08 (×2): qty 1

## 2017-11-08 MED ORDER — TAMSULOSIN HCL 0.4 MG PO CAPS
0.4000 mg | ORAL_CAPSULE | Freq: Every day | ORAL | Status: DC
  Administered 2017-11-08: 0.4 mg via ORAL
  Filled 2017-11-08: qty 1

## 2017-11-08 MED ORDER — SIMVASTATIN 40 MG PO TABS: 40.0000 mg | ORAL_TABLET | Freq: Every day | ORAL | Status: DC

## 2017-11-08 NOTE — Care Management Note (Signed)
Case Management Note  Patient Details  Name: Casey Fisher MRN: [id number] Date of Birth: 1928/05/16  Subjective/Objective:        Pt presented for CP.  Pt from home alone with no equipment or services.  Pt's daughter lives in [place] and checks on pt once/2 weeks.  Pt and daughter request HH RN, PT, RW, and meal delivery.            Action/Plan: Butch Penny with [facility] to arrange delivery of RW.  Tommi Rumps with [facility] given referral for [facility] RN and PT.  Pt and daughter understand that they will contact them in next 2 days.  Manuela Schwartz, SW will address meal delivery with pt.    Expected Discharge Date:  11/08/17               Expected Discharge Plan:  [place]  In-House Referral:  Clinical Social Work  Discharge planning Services  CM Consult, Mobile Meals  Post Acute Care Choice:  Durable Medical Equipment, Home Health Choice offered to:  Patient, Adult Children  DME Arranged:  Walker rolling DME Agency:  [place] Arranged:  PT, RN [facility] Agency:  [place]  Status of Service:  Completed, signed off  If discussed at [place] of Stay Meetings, dates discussed:    Additional Comments:  Arley Phenix, RN 11/08/2017, 2:41 PM

## 2017-11-08 NOTE — Discharge Summary (Signed)
Physician Discharge Summary  Casey Fisher:[id number] DOB: [birth year] DOA: 11/08/2017  PCP: [name], MD  Admit date: 11/08/2017 Discharge date: 11/08/2017   Recommendations for Outpatient Follow-Up:   Home health  Discharge Diagnosis:   Principal Problem:   Chest pain   Discharge disposition:  Home  Discharge Condition: Improved.  Diet recommendation: Low sodium, heart healthy  Wound care: None.   History of Present Illness:   Casey Fisher is a 83 y.o. male with medical history significant of HTN, prostate cancer in remission, and peripheral neuropathy; who presents with complaints of chest pain after waking up out of sleep last night.  Patient describes pain is left-sided with complaints of left arm pain.  Reported associated symptoms of feeling lightheaded.  Denied having any nausea, vomiting, diaphoresis, or shortness of breath.  Previously, reports having similar symptoms over 10 years ago for which she was started on blood pressure medication and aspirin.     Hospital Course by Problem:   Chest pain: Acute.   - resolved -CE negative -seen by cardiology -echo: - Left ventricle: The cavity size was normal. There was mild focal basal hypertrophy of the septum. Systolic function was normal. The estimated ejection fraction was in the range of 55% to 60%.  Wall motion was normal; there were no regional wall motion abnormalities. Features are consistent with a pseudonormal left ventricular filling pattern, with concomitant abnormal relaxationand increased filling pressure (grade 2 diastolic dysfunction).  Elevated BNP:  -continue HCTZ -has diastolic dsfn  Essential hypertension - Continue hydrochlorothiazide  History of prostate cancer: Noted to be in remission  BPH - Continue Flomax  Peripheral neuropathy - Continue gabapentin  Hypokalemia -replete    Medical Consultants:    cards   Discharge Exam:   Vitals:   11/08/17 0816 11/08/17 1137  BP: 115/68 116/62  Pulse: 61 (!) 51  Resp: 18   Temp: 98.1 F (36.7 C) 98.2 F (36.8 C)  SpO2: 97% 100%   Vitals:   11/08/17 0700 11/08/17 0730 11/08/17 0816 11/08/17 1137  BP: 120/71 121/80 115/68 116/62  Pulse: (!) 56 (!) 53 61 (!) 51  Resp: 15 20 18    Temp:   98.1 F (36.7 C) 98.2 F (36.8 C)  TempSrc:   Oral Oral  SpO2: 98% 96% 97% 100%  Weight:   77.7 kg (171 lb 4.8 oz)   Height:   6\' 3"  (1.905 m)     Gen:  NAD    The results of significant diagnostics from this hospitalization (including imaging, microbiology, ancillary and laboratory) are listed below for reference.     Procedures and Diagnostic Studies:   Dg Chest 2 View  Result Date: 11/08/2017 CLINICAL DATA:  Central chest pain today. Prior smoker. History of prostate cancer. EXAM: CHEST  2 VIEW COMPARISON:  Chest radiograph September 16, 2017 FINDINGS: Cardiomediastinal silhouette is normal. Calcified aortic knob. Interstitial prominence, confluent in the lung bases with trace pleural effusions. No pneumothorax. Soft tissue planes and included osseous structures are nonsuspicious. IMPRESSION: Mild interstitial prominence, confluent in lung bases concerning for atypical infection with trace pleural effusions. Electronically Signed   By: Elon Alas M.D.   On: 11/08/2017 04:43     Labs:   Basic Metabolic Panel: Recent Labs  Lab 11/08/17 0347  NA 139  K 3.3*  CL 103  CO2 29  GLUCOSE 97  BUN 18  CREATININE 0.85  CALCIUM 9.2  MG 1.9   GFR Estimated Creatinine Clearance: 67.3 mL/min (  by C-G formula based on SCr of 0.85 mg/dL). Liver Function Tests: No results for input(s): AST, ALT, ALKPHOS, BILITOT, PROT, ALBUMIN in the last 168 hours. No results for input(s): LIPASE, AMYLASE in the last 168 hours. No results for input(s): AMMONIA in the last 168 hours. Coagulation profile No results for input(s): INR, PROTIME in the last 168 hours.  CBC: Recent Labs  Lab  11/08/17 0347  WBC 5.4  NEUTROABS 2.8  HGB 12.1*  HCT 38.5*  MCV 88.7  PLT 171   Cardiac Enzymes: Recent Labs  Lab 11/08/17 0347 11/08/17 0849 11/08/17 1138  TROPONINI <0.03 <0.03 <0.03   BNP: Invalid input(s): POCBNP CBG: No results for input(s): GLUCAP in the last 168 hours. D-Dimer No results for input(s): DDIMER in the last 72 hours. Hgb A1c No results for input(s): HGBA1C in the last 72 hours. Lipid Profile Recent Labs    11/08/17 0347  CHOL 139  HDL 60  LDLCALC 73  TRIG 29  CHOLHDL 2.3   Thyroid function studies Recent Labs    11/08/17 0849  TSH 4.442   Anemia work up No results for input(s): VITAMINB12, FOLATE, FERRITIN, TIBC, IRON, RETICCTPCT in the last 72 hours. Microbiology No results found for this or any previous visit (from the past 240 hour(s)).   Discharge Instructions:   Discharge Instructions    Diet - low sodium heart healthy   Complete by:  As directed    Discharge instructions   Complete by:  As directed    Home health   Increase activity slowly   Complete by:  As directed      Allergies as of 11/08/2017      Reactions   Lyrica [pregabalin] Other (See Comments)   "constipation"      Medication List    STOP taking these medications   RAPAFLO 8 MG Caps capsule Generic drug:  silodosin     TAKE these medications   aspirin EC 81 MG tablet Take 81 mg by mouth daily. Reported on 05/14/2016   CALCIUM 600 + D PO Take 1 tablet by mouth daily.   DULoxetine 30 MG capsule Commonly known as:  CYMBALTA take 1 capsule by mouth once daily   gabapentin 600 MG tablet Commonly known as:  NEURONTIN Take 1 tablet (600 mg total) by mouth 3 (three) times daily. Initially 1 po QD for 1 week, then 1 po BID for 1 week, then 1 po TID. What changed:  additional instructions   hydrochlorothiazide 25 MG tablet Commonly known as:  HYDRODIURIL take 1 tablet by mouth once daily   LUPRON IJ Inject 1 application as directed every 6 (six)  months.   simvastatin 40 MG tablet Commonly known as:  ZOCOR take 1 tablet by mouth once daily   tamsulosin 0.4 MG Caps capsule Commonly known as:  FLOMAX Take 0.4 mg daily by mouth. Reported on 05/14/2016            Durable Medical Equipment  (From admission, onward)        Start     Ordered   11/08/17 1405  For home use only DME Walker rolling  Once    Question:  Patient needs a walker to treat with the following condition  Answer:  Weakness   11/08/17 1407        Time coordinating discharge: 35 min  Signed:  Quanisha Drewry U Finn Amos   [facility] 11/08/2017, 2:11 PM

## 2017-11-08 NOTE — Discharge Instructions (Signed)
Chest Wall Pain °Chest wall pain is pain in or around the bones and muscles of your chest. Sometimes, an injury causes this pain. Sometimes, the cause may not be known. This pain may take several weeks or longer to get better. °Follow these instructions at home: °Pay attention to any changes in your symptoms. Take these actions to help with your pain: °· Rest as told by your health care provider. °· Avoid activities that cause pain. These include any activities that use your chest muscles or your abdominal and side muscles to lift heavy items. °· If directed, apply ice to the painful area: °¨ Put ice in a plastic bag. °¨ Place a towel between your skin and the bag. °¨ Leave the ice on for 20 minutes, 2-3 times per day. °· Take over-the-counter and prescription medicines only as told by your health care provider. °· Do not use tobacco products, including cigarettes, chewing tobacco, and e-cigarettes. If you need help quitting, ask your health care provider. °· Keep all follow-up visits as told by your health care provider. This is important. °Contact a health care provider if: °· You have a fever. °· Your chest pain becomes worse. °· You have new symptoms. °Get help right away if: °· You have nausea or vomiting. °· You feel sweaty or light-headed. °· You have a cough with phlegm (sputum) or you cough up blood. °· You develop shortness of breath. °This information is not intended to replace advice given to you by your health care provider. Make sure you discuss any questions you have with your health care provider. °Document Released: 12/14/2005 Document Revised: 04/23/2016 Document Reviewed: 03/11/2015 °Elsevier Interactive Patient Education © 2017 [employer]. ° °

## 2017-11-08 NOTE — ED Triage Notes (Signed)
Pt BIB [facility] for chest pain. Awoke patient around 0300. Currently no chest pain. 324 asa given pta. No pain during triage

## 2017-11-08 NOTE — ED Provider Notes (Signed)
[place] EMERGENCY DEPARTMENT Provider Note   CSN: [id number] Arrival date & time: 11/08/17  0344     History   Chief Complaint Chief Complaint  Patient presents with  . Chest Pain    HPI Casey Fisher is a 83 y.o. male.  Patient presents to the emergency department for evaluation of chest pain.  Patient reports that he was awakened from sleep with pain.  Pain was in the left side of his chest and radiated to the left arm.  He does report that this felt similar to when he had a heart attack.  Patient reports that the pain has subsided during transport and he is not experiencing pain at arrival.  He was given for low-dose aspirin prior to arrival, no nitroglycerin.      Past Medical History:  Diagnosis Date  . Abnormality of gait 03/24/2016  . Anxiety   . Cancer ([place])   . Cataract   . Depression   . Heart murmur   . Hereditary and idiopathic peripheral neuropathy 03/24/2016  . Hypertension   . Memory difficulty 03/24/2016  . Neuromuscular disorder ([place])   . Neuropathy   . Prostate cancer [facility])    remission    Patient Active Problem List   Diagnosis Date Noted  . Memory difficulty 03/24/2016  . Hereditary and idiopathic peripheral neuropathy 03/24/2016  . Abnormality of gait 03/24/2016  . Peripheral neuropathy 04/18/2015  . Hyperlipidemia 04/18/2015  . Prostate cancer ([place]) 08/24/2014  . Right inguinal hernia 10/30/2013    Past Surgical History:  Procedure Laterality Date  . CATARACT EXTRACTION Bilateral   . EYE SURGERY    . HERNIA REPAIR  1985  . PROSTATE SURGERY         Home Medications    Prior to Admission medications   Medication Sig Start Date End Date Taking? Authorizing Provider  aspirin EC 81 MG tablet Take 81 mg by mouth daily. Reported on 05/14/2016    [provider]  Calcium Carb-Cholecalciferol (CALCIUM 600 + D PO) Take 1 tablet by mouth daily.    [provider]  DULoxetine (CYMBALTA) 30 MG  capsule take 1 capsule by mouth once daily 01/21/17   Kathrynn Ducking, MD  gabapentin (NEURONTIN) 600 MG tablet Take 1 tablet (600 mg total) by mouth 3 (three) times daily. Initially 1 po QD for 1 week, then 1 po BID for 1 week, then 1 po TID. 09/30/17   [name], MD  hydrochlorothiazide (HYDRODIURIL) 25 MG tablet take 1 tablet by mouth once daily 09/24/17   [name], MD  Leuprolide Acetate (LUPRON IJ) Inject 1 application as directed every 6 (six) months.    [provider]  RAPAFLO 8 MG CAPS capsule  09/08/17   [provider]  simvastatin (ZOCOR) 40 MG tablet take 1 tablet by mouth once daily 09/24/17   [name], MD  tamsulosin [facility]) 0.4 MG CAPS capsule Reported on 05/14/2016 08/17/15   [provider]    Family History Family History  Problem Relation Age of Onset  . Brain cancer Daughter   . Heart disease Mother   . Stroke Mother   . Heart disease Father     Social History Social History   Tobacco Use  . Smoking status: Former Research scientist (life sciences)  . Smokeless tobacco: Never Used  Substance Use Topics  . Alcohol use: No  . Drug use: No     Allergies   Lyrica [pregabalin]   Review  of Systems Review of Systems  Cardiovascular: Positive for chest pain.  All other systems reviewed and are negative.    Physical Exam Updated Vital Signs BP 130/80   Pulse (!) 48   Temp 98.7 F (37.1 C) (Oral)   Resp 15   Ht 6\' 4"  (1.93 m)   Wt 71.7 kg (158 lb)   SpO2 96%   BMI 19.23 kg/m   Physical Exam  Constitutional: He is oriented to person, place, and time. He appears well-developed and well-nourished. No distress.  HENT:  Head: Normocephalic and atraumatic.  Right Ear: Hearing normal.  Left Ear: Hearing normal.  Nose: Nose normal.  Mouth/Throat: Oropharynx is clear and moist and mucous membranes are normal.  Eyes: Conjunctivae and EOM are normal. Pupils are equal, round, and reactive to light.  Neck: Normal range of motion. Neck  supple.  Cardiovascular: Regular rhythm, S1 normal and S2 normal. Exam reveals no gallop and no friction rub.  No murmur heard. Pulmonary/Chest: Effort normal and breath sounds normal. No respiratory distress. He exhibits no tenderness.  Abdominal: Soft. Normal appearance and bowel sounds are normal. There is no hepatosplenomegaly. There is no tenderness. There is no rebound, no guarding, no tenderness at McBurney's point and negative Murphy's sign. No hernia.  Musculoskeletal: Normal range of motion.       Right lower leg: He exhibits edema.  Neurological: He is alert and oriented to person, place, and time. He has normal strength. No cranial nerve deficit or sensory deficit. Coordination normal. GCS eye subscore is 4. GCS verbal subscore is 5. GCS motor subscore is 6.  Skin: Skin is warm, dry and intact. No rash noted. No cyanosis.  Psychiatric: He has a normal mood and affect. His speech is normal and behavior is normal. Thought content normal.  Nursing note and vitals reviewed.    ED Treatments / Results  Labs (all labs ordered are listed, but only abnormal results are displayed) Labs Reviewed  CBC WITH DIFFERENTIAL/PLATELET - Abnormal; Notable for the following components:      Result Value   Hemoglobin 12.1 (*)    HCT 38.5 (*)    All other components within normal limits  BASIC METABOLIC PANEL - Abnormal; Notable for the following components:   Potassium 3.3 (*)    All other components within normal limits  BRAIN NATRIURETIC PEPTIDE - Abnormal; Notable for the following components:   B Natriuretic Peptide 145.4 (*)    All other components within normal limits  I-STAT TROPONIN, ED    EKG  EKG Interpretation  Date/Time:  Monday November 08 2017 03:49:10 EST Ventricular Rate:  66 PR Interval:    QRS Duration: 117 QT Interval:  467 QTC Calculation: 490 R Axis:   -55 Text Interpretation:  Sinus rhythm Premature ventricular complexes LAD, consider left anterior fascicular  block Confirmed by Orpah Greek [phone]) on 11/08/2017 3:53:44 AM       Radiology Dg Chest 2 View  Result Date: 11/08/2017 CLINICAL DATA:  Central chest pain today. Prior smoker. History of prostate cancer. EXAM: CHEST  2 VIEW COMPARISON:  Chest radiograph September 16, 2017 FINDINGS: Cardiomediastinal silhouette is normal. Calcified aortic knob. Interstitial prominence, confluent in the lung bases with trace pleural effusions. No pneumothorax. Soft tissue planes and included osseous structures are nonsuspicious. IMPRESSION: Mild interstitial prominence, confluent in lung bases concerning for atypical infection with trace pleural effusions. Electronically Signed   By: Elon Alas M.D.   On: 11/08/2017 04:43    Procedures Procedures (  including critical care time)  Medications Ordered in ED Medications - No data to display   Initial Impression / Assessment and Plan / ED Course  I have reviewed the triage vital signs and the nursing notes.  Pertinent labs & imaging results that were available during my care of the patient were reviewed by me and considered in my medical decision making (see chart for details).     Patient with history of coronary artery disease presents to the emergency department for evaluation of chest pain.  Patient reports that he was awakened from sleep with chest pain.  He was administered aspirin prior to arrival, no nitroglycerin or other interventions.  Patient reports resolution of the pain at arrival, however, he states that the pain felt similar to previous MI.  Reviewing his records, however, reveals his most recent cardiologist appointment was in 2016.  At that time they stated that he had a history of a heart catheterization approximately 20 years before, but did not comment and any history of coronary artery disease PCI or previous MI.  Pain was felt to be atypical at that time.  Patient does have multiple cardiac risk factors, will observe for  cardiac rule out.  Final Clinical Impressions(s) / ED Diagnoses   Final diagnoses:  Chest pain, unspecified type    ED Discharge Orders    None       Orpah Greek, MD 11/08/17 [phone]

## 2017-11-08 NOTE — Consult Note (Signed)
Cardiology Consultation:   Patient ID: Casey Fisher; [id number]; July 09, 1928   Admit date: 11/08/2017 Date of Consult: 11/08/2017  Primary Care Provider: [name], MD Primary Cardiologist: New Primary Electrophysiologist:  None   Patient Profile:   Casey Fisher is a 83 y.o. male with a hx of HTN who is being seen today for the evaluation of chest pain at the request of Dr Eliseo Squires.  History of Present Illness:   Mr. Casey Fisher 83 y.o. HTN. [place] a week ago. Awoke this am with sharp pain in manubrial area. Pressed area with his hand. Lasted a few minutes. Went away spontaneously. Pain has not returned Denies radiation to me. No associated diaphoresis dyspnea palpitations or syncope Fall was mechanical. Lives alone and  Thought he better get it check out. No family lives alone Friend from church with him in ER No history of CAD, MI or valvular heart disease. In ER R/O no acute ECG changes Echo is pending   Past Medical History:  Diagnosis Date  . Abnormality of gait 03/24/2016  . Anxiety   . Cancer ([place])   . Cataract   . Depression   . Heart murmur   . Hereditary and idiopathic peripheral neuropathy 03/24/2016  . Hypertension   . Memory difficulty 03/24/2016  . Neuromuscular disorder ([place])   . Neuropathy   . Prostate cancer ([place])    remission    Past Surgical History:  Procedure Laterality Date  . CATARACT EXTRACTION Bilateral   . EYE SURGERY    . HERNIA REPAIR  1985  . PROSTATE SURGERY       Home Medications:  Prior to Admission medications   Medication Sig Start Date End Date Taking? Authorizing Provider  aspirin EC 81 MG tablet Take 81 mg by mouth daily. Reported on 05/14/2016   Yes [provider]  Calcium Carb-Cholecalciferol (CALCIUM 600 + D PO) Take 1 tablet by mouth daily.   Yes [provider]  DULoxetine (CYMBALTA) 30 MG capsule take 1 capsule by mouth once daily 01/21/17  Yes Kathrynn Ducking, MD  gabapentin (NEURONTIN) 600  MG tablet Take 1 tablet (600 mg total) by mouth 3 (three) times daily. Initially 1 po QD for 1 week, then 1 po BID for 1 week, then 1 po TID. Patient taking differently: Take 600 mg 3 (three) times daily by mouth.  09/30/17  Yes [name], MD  hydrochlorothiazide (HYDRODIURIL) 25 MG tablet take 1 tablet by mouth once daily 09/24/17  Yes [name], MD  Leuprolide Acetate (LUPRON IJ) Inject 1 application as directed every 6 (six) months.   Yes [provider]  RAPAFLO 8 MG CAPS capsule Take 8 mg daily with breakfast by mouth.  09/08/17  Yes [provider]  simvastatin (ZOCOR) 40 MG tablet take 1 tablet by mouth once daily 09/24/17  Yes [name], MD  tamsulosin (FLOMAX) 0.4 MG CAPS capsule Take 0.4 mg daily by mouth. Reported on 05/14/2016 08/17/15  Yes [provider]    Inpatient Medications: Scheduled Meds: . [START ON 11/09/2017] aspirin EC  81 mg Oral Daily  . enoxaparin (LOVENOX) injection  40 mg Subcutaneous Daily  . gabapentin  600 mg Oral TID  . hydrochlorothiazide  25 mg Oral Daily  . potassium chloride  40 mEq Oral STAT  . simvastatin  40 mg Oral q1800  . tamsulosin  0.4 mg Oral Daily   Continuous Infusions:  PRN Meds: acetaminophen, gi cocktail, morphine injection, ondansetron (ZOFRAN)  IV  Allergies:    Allergies  Allergen Reactions  . Lyrica [Pregabalin] Other (See Comments)    "constipation"    Social History:   Social History   Socioeconomic History  . Marital status: Widowed    Spouse name: Not on file  . Number of children: Not on file  . Years of education: Not on file  . Highest education level: Not on file  Social Needs  . Financial resource strain: Not on file  . Food insecurity - worry: Not on file  . Food insecurity - inability: Not on file  . Transportation needs - medical: Not on file  . Transportation needs - non-medical: Not on file  Occupational History  . Not on file  Tobacco Use  . Smoking  status: Former Research scientist (life sciences)  . Smokeless tobacco: Never Used  Substance and Sexual Activity  . Alcohol use: No  . Drug use: No  . Sexual activity: Not on file  Other Topics Concern  . Not on file  Social History Narrative   Lives home alone.  Retired.  Widowed.  % children.      Family History:    Family History  Problem Relation Age of Onset  . Brain cancer Daughter   . Heart disease Mother   . Stroke Mother   . Heart disease Father      ROS:  Please see the history of present illness.  ROS  All other ROS reviewed and negative.     Physical Exam/Data:   Vitals:   11/08/17 0530 11/08/17 0545 11/08/17 0700 11/08/17 0730  BP:  115/71 120/71 121/80  Pulse:  61 (!) 56 (!) 53  Resp:  18 15 20   Temp:      TempSrc:      SpO2: 100% 97% 98% 96%  Weight:      Height:       No intake or output data in the 24 hours ending 11/08/17 0800 Filed Weights   11/08/17 0349  Weight: 158 lb (71.7 kg)   Body mass index is 19.23 kg/m.  General:  Thin elderly black male  HEENT: normal Lymph: no adenopathy Neck: no JVD Endocrine:  No thryomegaly Vascular: No carotid bruits; FA pulses 2+ bilaterally without bruits  Cardiac:  normal S1, S2; RRR; no murmur   Lungs:  clear to auscultation bilaterally, no wheezing, rhonchi or rales  Abd: soft, nontender, no hepatomegaly  Ext: no edema Musculoskeletal:  No deformities, BUE and BLE strength normal and equal Skin: warm and dry  Neuro:  CNs 2-12 intact, no focal abnormalities noted Psych:  Normal affect   EKG:  The EKG was personally reviewed and demonstrates:  NSR PVC nonspecific ST changes  Telemetry:  Telemetry was personally reviewed and demonstrates:  NSR  Relevant CV Studies: None  Laboratory Data:  Chemistry Recent Labs  Lab 11/08/17 0347  NA 139  K 3.3*  CL 103  CO2 29  GLUCOSE 97  BUN 18  CREATININE 0.85  CALCIUM 9.2  GFRNONAA >60  GFRAA >60  ANIONGAP 7    No results for input(s): PROT, ALBUMIN, AST, ALT, ALKPHOS,  BILITOT in the last 168 hours. Hematology Recent Labs  Lab 11/08/17 0347  WBC 5.4  RBC 4.34  HGB 12.1*  HCT 38.5*  MCV 88.7  MCH 27.9  MCHC 31.4  RDW 14.5  PLT 171   Cardiac EnzymesNo results for input(s): TROPONINI in the last 168 hours.  Recent Labs  Lab 11/08/17 0359  TROPIPOC 0.00  BNP Recent Labs  Lab 11/08/17 0347  BNP 145.4*    DDimer No results for input(s): DDIMER in the last 168 hours.  Radiology/Studies:  Dg Chest 2 View  Result Date: 11/08/2017 CLINICAL DATA:  Central chest pain today. Prior smoker. History of prostate cancer. EXAM: CHEST  2 VIEW COMPARISON:  Chest radiograph September 16, 2017 FINDINGS: Cardiomediastinal silhouette is normal. Calcified aortic knob. Interstitial prominence, confluent in the lung bases with trace pleural effusions. No pneumothorax. Soft tissue planes and included osseous structures are nonsuspicious. IMPRESSION: Mild interstitial prominence, confluent in lung bases concerning for atypical infection with trace pleural effusions. Electronically Signed   By: Elon Alas M.D.   On: 11/08/2017 04:43    Assessment and Plan:   1. Chest Pain: atypical , resolved , R/O will order echo . Ok to d/c home if echo has no RWMA;s and normal EF. No need for stress testing in very elderly male with no prior history of vascular disease and one self limited episode of atypical pain 2. HTN:  Well controlled.  Continue current medications and low sodium Dash type diet.   3. BNP:  Minimal elevation no CHF on exam echo ? Diastolic abnormality consistent with age  68. Prostate cancer in research protocol flomax PSA with primary  5. Cholesterol:  On statin    For questions or updates, please contact [place] Please consult [email or web address] for contact info under Cardiology/STEMI.   Signed, Jenkins Rouge, MD  11/08/2017 8:00 AM

## 2017-11-08 NOTE — Progress Notes (Signed)
Patient admitted after midnight, please see H&P.  Echo read pending-- plan for d/c after if normal.  Diet ordered as no intervention planned.  Eulogio Bear DO

## 2017-11-08 NOTE — Progress Notes (Signed)
Discharge summary reviewed with pt and daughter at bedside. All questions addressed and pt/family express no concerns at this time. No change since prior assessment. PIV removed and pt discharged to lobby with daughter via wheelchair and NT.

## 2017-11-08 NOTE — H&P (Signed)
History and Physical    Casey Fisher DZH:[id number] DOB: 1928-05-09 DOA: 11/08/2017  Referring MD/NP/PA: Dr. Jaymes Graff PCP: [name], MD  Patient coming from: Home via EMS  Chief Complaint: Chest pain  HPI: Casey Fisher is a 83 y.o. male with medical history significant of HTN, prostate cancer in remission, and peripheral neuropathy; who presents with complaints of chest pain after waking up out of sleep last night.  Patient describes pain is left-sided with complaints of left arm pain.  Reported associated symptoms of feeling lightheaded.  Denied having any nausea, vomiting, diaphoresis, or shortness of breath.  Previously, reports having similar symptoms over 10 years ago for which she was started on blood pressure medication and aspirin.  ED Course: Upon EMS arrival patient was given 325 mg of aspirin chewed.  Patient was noted to have negative troponin and BNP was 145.4.  EKG showed sinus rhythm with PVCs.   ROS  Past Medical History:  Diagnosis Date  . Abnormality of gait 03/24/2016  . Anxiety   . Cancer ([place])   . Cataract   . Depression   . Heart murmur   . Hereditary and idiopathic peripheral neuropathy 03/24/2016  . Hypertension   . Memory difficulty 03/24/2016  . Neuromuscular disorder ([place])   . Neuropathy   . Prostate cancer ([place])    remission    Past Surgical History:  Procedure Laterality Date  . CATARACT EXTRACTION Bilateral   . EYE SURGERY    . HERNIA REPAIR  1985  . PROSTATE SURGERY       reports that he has quit smoking. he has never used smokeless tobacco. He reports that he does not drink alcohol or use drugs.  Allergies  Allergen Reactions  . Lyrica [Pregabalin] Other (See Comments)    "constipation"    Family History  Problem Relation Age of Onset  . Brain cancer Daughter   . Heart disease Mother   . Stroke Mother   . Heart disease Father     Prior to Admission medications   Medication Sig Start Date End Date  Taking? Authorizing Provider  aspirin EC 81 MG tablet Take 81 mg by mouth daily. Reported on 05/14/2016    [provider]  Calcium Carb-Cholecalciferol (CALCIUM 600 + D PO) Take 1 tablet by mouth daily.    [provider]  DULoxetine (CYMBALTA) 30 MG capsule take 1 capsule by mouth once daily 01/21/17   Kathrynn Ducking, MD  gabapentin (NEURONTIN) 600 MG tablet Take 1 tablet (600 mg total) by mouth 3 (three) times daily. Initially 1 po QD for 1 week, then 1 po BID for 1 week, then 1 po TID. 09/30/17   [name], MD  hydrochlorothiazide (HYDRODIURIL) 25 MG tablet take 1 tablet by mouth once daily 09/24/17   [name], MD  Leuprolide Acetate (LUPRON IJ) Inject 1 application as directed every 6 (six) months.    [provider]  RAPAFLO 8 MG CAPS capsule  09/08/17   [provider]  simvastatin (ZOCOR) 40 MG tablet take 1 tablet by mouth once daily 09/24/17   [name], MD  tamsulosin [facility]) 0.4 MG CAPS capsule Reported on 05/14/2016 08/17/15   [provider]    Physical Exam:  Constitutional: Elderly male in NAD, calm, comfortable Vitals:   11/08/17 0430 11/08/17 0445 11/08/17 0530 11/08/17 0545  BP: 125/74 130/80  115/71  Pulse: (!) 57 (!) 48  61  Resp: 16 15  18  Temp:      TempSrc:      SpO2: 97% 96% 100% 97%  Weight:      Height:       Eyes: PERRL, lids and conjunctivae normal ENMT: Mucous membranes are moist. Posterior pharynx clear of any exudate or lesions.  Neck: normal, supple, no masses, no thyromegaly Respiratory: clear to auscultation bilaterally, no wheezing, no crackles. Normal respiratory effort. No accessory muscle use.  Cardiovascular: Regular rate and rhythm, no murmurs / rubs / gallops.  Trace lower extremity edema. 2+ pedal pulses. No carotid bruits.  Chest pain not reproducible on exam. Abdomen: no tenderness, no masses palpated. No hepatosplenomegaly. Bowel sounds positive.  Musculoskeletal: no  clubbing / cyanosis. No joint deformity upper and lower extremities. Good ROM, no contractures. Normal muscle tone.  Skin: no rashes, lesions, ulcers. No induration Neurologic: CN 2-12 grossly intact. Sensation abnormal, DTR normal. Strength 5/5 in all 4.  Psychiatric: Normal judgment and insight. Alert and oriented x 3. Normal mood.     Labs on Admission: I have personally reviewed following labs and imaging studies  CBC: Recent Labs  Lab 11/08/17 0347  WBC 5.4  NEUTROABS 2.8  HGB 12.1*  HCT 38.5*  MCV 88.7  PLT 762   Basic Metabolic Panel: Recent Labs  Lab 11/08/17 0347  NA 139  K 3.3*  CL 103  CO2 29  GLUCOSE 97  BUN 18  CREATININE 0.85  CALCIUM 9.2   GFR: Estimated Creatinine Clearance: 62.1 mL/min (by C-G formula based on SCr of 0.85 mg/dL). Liver Function Tests: No results for input(s): AST, ALT, ALKPHOS, BILITOT, PROT, ALBUMIN in the last 168 hours. No results for input(s): LIPASE, AMYLASE in the last 168 hours. No results for input(s): AMMONIA in the last 168 hours. Coagulation Profile: No results for input(s): INR, PROTIME in the last 168 hours. Cardiac Enzymes: No results for input(s): CKTOTAL, CKMB, CKMBINDEX, TROPONINI in the last 168 hours. BNP (last 3 results) No results for input(s): PROBNP in the last 8760 hours. HbA1C: No results for input(s): HGBA1C in the last 72 hours. CBG: No results for input(s): GLUCAP in the last 168 hours. Lipid Profile: No results for input(s): CHOL, HDL, LDLCALC, TRIG, CHOLHDL, LDLDIRECT in the last 72 hours. Thyroid Function Tests: No results for input(s): TSH, T4TOTAL, FREET4, T3FREE, THYROIDAB in the last 72 hours. Anemia Panel: No results for input(s): VITAMINB12, FOLATE, FERRITIN, TIBC, IRON, RETICCTPCT in the last 72 hours. Urine analysis:    Component Value Date/Time   COLORURINE RED (A) 05/29/2012 1354   APPEARANCEUR CLOUDY (A) 05/29/2012 1354   LABSPEC 1.016 05/29/2012 1354   PHURINE 6.5 05/29/2012 1354     GLUCOSEU NEGATIVE 05/29/2012 1354   HGBUR LARGE (A) 05/29/2012 1354   BILIRUBINUR NEGATIVE 05/29/2012 1354   KETONESUR TRACE (A) 05/29/2012 1354   PROTEINUR 100 (A) 05/29/2012 1354   UROBILINOGEN 0.2 05/29/2012 1354   NITRITE NEGATIVE 05/29/2012 1354   LEUKOCYTESUR MODERATE (A) 05/29/2012 1354   Sepsis Labs: No results found for this or any previous visit (from the past 240 hour(s)).   Radiological Exams on Admission: Dg Chest 2 View  Result Date: 11/08/2017 CLINICAL DATA:  Central chest pain today. Prior smoker. History of prostate cancer. EXAM: CHEST  2 VIEW COMPARISON:  Chest radiograph September 16, 2017 FINDINGS: Cardiomediastinal silhouette is normal. Calcified aortic knob. Interstitial prominence, confluent in the lung bases with trace pleural effusions. No pneumothorax. Soft tissue planes and included osseous structures are nonsuspicious. IMPRESSION: Mild interstitial prominence, confluent in  lung bases concerning for atypical infection with trace pleural effusions. Electronically Signed   By: Elon Alas M.D.   On: 11/08/2017 04:43    EKG: Independently reviewed.  Sinus rhythm with PVCs  Assessment/Plan Chest pain: Acute.   - Admit to telemetry bed - Trend cardiac troponins - Check TSH and lipid panel - Check echocardiogram - ASA - message sent for cardiology to eval  Elevated BNP: Patient noted to have BNP of 145.4 on admission.  Patient with trace lower extremity edema.  Chest x-ray showing mild interstitial prominence with possible in lung bases can return for atypical infection was raised diffusions.  Abnormal chest x-ray: Patient denies any infectious symptoms at this time. - May warrant further investigation  Essential hypertension - Continue hydrochlorothiazide  History of prostate cancer: Noted to be in remission  BPH - Continue Flomax  Peripheral neuropathy - Continue gabapentin  DVT prophylaxis: Lovenox Code Status:full Family Communication:  Plan with the patient and family present at bedside Disposition Plan: Possible discharge home if workup negative Consults called: none Admission status: Observation  Norval Morton MD [facility] Pager [phone]   If 7PM-7AM, please contact night-coverage [email or web address] Password [email or web address]  11/08/2017, 6:02 AM

## 2017-11-08 NOTE — Progress Notes (Signed)
  Echocardiogram 2D Echocardiogram has been performed.  Casey Fisher T Casey Fisher 11/08/2017, 9:50 AM

## 2017-11-10 DIAGNOSIS — I10 Essential (primary) hypertension: Secondary | ICD-10-CM | POA: Diagnosis not present

## 2017-11-10 DIAGNOSIS — N400 Enlarged prostate without lower urinary tract symptoms: Secondary | ICD-10-CM | POA: Diagnosis not present

## 2017-11-11 DIAGNOSIS — N400 Enlarged prostate without lower urinary tract symptoms: Secondary | ICD-10-CM | POA: Diagnosis not present

## 2017-11-11 DIAGNOSIS — I10 Essential (primary) hypertension: Secondary | ICD-10-CM | POA: Diagnosis not present

## 2017-11-15 DIAGNOSIS — I10 Essential (primary) hypertension: Secondary | ICD-10-CM | POA: Diagnosis not present

## 2017-11-15 DIAGNOSIS — N400 Enlarged prostate without lower urinary tract symptoms: Secondary | ICD-10-CM | POA: Diagnosis not present

## 2017-11-16 DIAGNOSIS — N400 Enlarged prostate without lower urinary tract symptoms: Secondary | ICD-10-CM | POA: Diagnosis not present

## 2017-11-16 DIAGNOSIS — I10 Essential (primary) hypertension: Secondary | ICD-10-CM | POA: Diagnosis not present

## 2017-11-17 DIAGNOSIS — N400 Enlarged prostate without lower urinary tract symptoms: Secondary | ICD-10-CM | POA: Diagnosis not present

## 2017-11-17 DIAGNOSIS — I10 Essential (primary) hypertension: Secondary | ICD-10-CM | POA: Diagnosis not present

## 2017-11-22 DIAGNOSIS — N400 Enlarged prostate without lower urinary tract symptoms: Secondary | ICD-10-CM | POA: Diagnosis not present

## 2017-11-22 DIAGNOSIS — I10 Essential (primary) hypertension: Secondary | ICD-10-CM | POA: Diagnosis not present

## 2017-11-24 DIAGNOSIS — N400 Enlarged prostate without lower urinary tract symptoms: Secondary | ICD-10-CM | POA: Diagnosis not present

## 2017-11-24 DIAGNOSIS — I10 Essential (primary) hypertension: Secondary | ICD-10-CM | POA: Diagnosis not present

## 2017-11-24 NOTE — Progress Notes (Signed)
[facility]  PATIENT: Casey Fisher DOB: 11-22-1928   REASON FOR VISIT: follow up for peripheral neuropathy, mild memory disturbance HISTORY FROM: Patient alone at visit     HISTORY OF PRESENT ILLNESS:UPDATE 11/29/2018CM Casey Fisher, 83 year old male returns for follow-up with history of mild memory disturbance and peripheral neuropathy.  When last seen he had stopped his gabapentin but was continuing with Cymbalta.  His memory disturbance is stable according to the patient.  He does not want to be placed on any medication.  He continues to live alone independent in ADLs, claims he continues to drive without difficulty .  Family checks on him frequently he is ambulating with a single-point cane.  He had a ER visit the first of the month for chest pain.  He is now receiving in-home physical therapy.  His Neurontin was restarted while seen in the ER. He has no new complaints   UPDATE 03/25/17 CM Casey Fisher is an 83 year old right-handed black male with a history of a peripheral neuropathy and a mild memory disturbance. The patient has been on gabapentin taking 600 mg 3 times daily for the neuropathy, but he recently stopped the medication over a month and states his symptoms are no worse therefore he is going to stay off the medication for now. In addition he is on Cymbalta which she takes every day.  He continues to live alone but family checks on him frequently. He reports his appetite is good and he sleeps well at night. The patient uses a cane for ambulation, he reports no falls. He has been on Lyrica in the past but this caused a lot of constipation. 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 The patient has had some mild memory problems, but he believes that this has not progressed since last seen. His daughter is his power of attorney, she manages the finances. The patient keeps up with his own medications and appointments. He still  operates a Teacher, music without difficulty. He denies getting lost or being  involved in accidents He returns for an evaluation.    REVIEW OF SYSTEMS: Full 14 system review of systems performed and notable only for those listed, all others are neg:  Constitutional: neg  Cardiovascular: neg Ear/Nose/Throat: neg  Skin: neg Eyes: neg Respiratory: neg Gastroitestinal: neg  Hematology/Lymphatic: neg  Endocrine: neg Musculoskeletal:neg Allergy/Immunology: neg Neurological: Mild memory loss, peripheral neuropathy Psychiatric: neg Sleep : neg   ALLERGIES: Allergies  Allergen Reactions  . Lyrica [Pregabalin] Other (See Comments)    "constipation"    HOME MEDICATIONS: Outpatient Medications Prior to Visit  Medication Sig Dispense Refill  . aspirin EC 81 MG tablet Take 81 mg by mouth daily. Reported on 05/14/2016    . Calcium Carb-Cholecalciferol (CALCIUM 600 + D PO) Take 1 tablet by mouth daily.    . DULoxetine (CYMBALTA) 30 MG capsule take 1 capsule by mouth once daily 30 capsule 11  . gabapentin (NEURONTIN) 600 MG tablet Take 1 tablet (600 mg total) by mouth 3 (three) times daily. Initially 1 po QD for 1 week, then 1 po BID for 1 week, then 1 po TID. (Patient taking differently: Take 600 mg 3 (three) times daily by mouth. ) 90 tablet 2  . hydrochlorothiazide (HYDRODIURIL) 25 MG tablet take 1 tablet by mouth once daily 90 tablet 1  . Leuprolide Acetate (LUPRON IJ) Inject 1 application as directed every 6 (six) months.    Marland Kitchen RAPAFLO 8 MG  CAPS capsule   0  . simvastatin (ZOCOR) 40 MG tablet take 1 tablet by mouth once daily 90 tablet 1  . tamsulosin (FLOMAX) 0.4 MG CAPS capsule Take 0.4 mg daily by mouth. Reported on 05/14/2016  1   No facility-administered medications prior to visit.     PAST MEDICAL HISTORY: Past Medical History:  Diagnosis Date  . Abnormality of gait 03/24/2016  . Anxiety   . Cancer ([place])   . Cataract   . Depression   . Heart murmur   . Hereditary and  idiopathic peripheral neuropathy 03/24/2016  . Hypertension   . Memory difficulty 03/24/2016  . Neuromuscular disorder ([place])   . Neuropathy   . Prostate cancer ([place])    remission    PAST SURGICAL HISTORY: Past Surgical History:  Procedure Laterality Date  . CATARACT EXTRACTION Bilateral   . EYE SURGERY    . HERNIA REPAIR  1985  . INGUINAL HERNIA REPAIR Right 12/06/2013   Procedure: HERNIA REPAIR INGUINAL ADULT;  Surgeon: Merrie Roof, MD;  Location: [facility];  Service: General;  Laterality: Right;  . INSERTION OF MESH Right 12/06/2013   Procedure: INSERTION OF MESH;  Surgeon: Merrie Roof, MD;  Location: [facility];  Service: General;  Laterality: Right;  . PROSTATE SURGERY      FAMILY HISTORY: Family History  Problem Relation Age of Onset  . Brain cancer Daughter   . Heart disease Mother   . Stroke Mother   . Heart disease Father     SOCIAL HISTORY: Social History   Socioeconomic History  . Marital status: Widowed    Spouse name: Not on file  . Number of children: Not on file  . Years of education: Not on file  . Highest education level: Not on file  Social Needs  . Financial resource strain: Not on file  . Food insecurity - worry: Not on file  . Food insecurity - inability: Not on file  . Transportation needs - medical: Not on file  . Transportation needs - non-medical: Not on file  Occupational History  . Not on file  Tobacco Use  . Smoking status: Former Research scientist (life sciences)  . Smokeless tobacco: Never Used  Substance and Sexual Activity  . Alcohol use: No  . Drug use: No  . Sexual activity: Not on file  Other Topics Concern  . Not on file  Social History Narrative   Lives home alone.  Retired.  Widowed.  % children.       PHYSICAL EXAM  Vitals:   11/25/17 1237  BP: 126/64  Pulse: 71  Weight: 171 lb 9.6 oz (77.8 kg)   Body mass index is 21.45 kg/m.  Generalized: Well developed, in no acute distress  Head: normocephalic and atraumatic,. Oropharynx benign    Neck: Supple, Musculoskeletal: No deformity   Neurological examination   Mentation: Alert  MMSE - Mini Mental State Exam 11/25/2017 03/25/2017 09/25/2016  Orientation to time 5 5 5   Orientation to Place 3 4 5   Registration 3 3 3   Attention/ Calculation 2 3 3   Recall 2 1 1   Language- name 2 objects 2 2 2   Language- repeat 1 1 0  Language- follow 3 step command 3 3 3   Language- read & follow direction 1 1 1   Write a sentence 1 1 1   Copy design 0 1 1  Total score 23 25 25   AFT 9.   Follows all commands speech and language fluent.   Cranial nerve  II-XII: Pupils were equal round reactive to light extraocular movements were full, visual field were full on confrontational test. Facial sensation and strength were normal. hearing was intact to finger rubbing bilaterally. Uvula tongue midline. head turning and shoulder shrug were normal and symmetric.Tongue protrusion into cheek strength was normal. Motor: normal bulk and tone, full strength in the BUE, BLE, arthritic changes in hands  Sensory: normal and symmetric to light touch, on the face ,arms and legs Coordination: finger-nose-finger, heel-to-shin bilaterally, no dysmetria Reflexes: depressed and symmetric upper and lower, plantar responses were flexor bilaterally. Gait and Station: Rising up from seated position with push off, short shuffling gait pattern. Ambulates with single-point cane.  No difficulty with turns  DIAGNOSTIC DATA (LABS, IMAGING, TESTING) - I reviewed patient records, labs, notes, testing and imaging myself where available.  Lab Results  Component Value Date   WBC 5.4 11/08/2017   HGB 12.1 (L) 11/08/2017   HCT 38.5 (L) 11/08/2017   MCV 88.7 11/08/2017   PLT 171 11/08/2017      Component Value Date/Time   NA 139 11/08/2017 0347   NA 144 09/30/2017 1031   K 3.3 (L) 11/08/2017 0347   CL 103 11/08/2017 0347   CO2 29 11/08/2017 0347   GLUCOSE 97 11/08/2017 0347   BUN 18 11/08/2017 0347   BUN 16 09/30/2017  1031   CREATININE 0.85 11/08/2017 0347   CREATININE 0.95 03/11/2016 1407   CALCIUM 9.2 11/08/2017 0347   PROT 7.7 09/16/2017 1011   ALBUMIN 4.6 09/16/2017 1011   AST 18 09/16/2017 1011   ALT 9 09/16/2017 1011   ALKPHOS 72 09/16/2017 1011   BILITOT 0.6 09/16/2017 1011   GFRNONAA >60 11/08/2017 0347   GFRNONAA 72 03/11/2016 1407   GFRAA >60 11/08/2017 0347   GFRAA 83 03/11/2016 1407   Lab Results  Component Value Date   CHOL 139 11/08/2017   HDL 60 11/08/2017   LDLCALC 73 11/08/2017   TRIG 29 11/08/2017   CHOLHDL 2.3 11/08/2017    ASSESSMENT AND PLAN  83 y.o. year old male  has a medical history of Abnormality of gait idiopathic peripheral neuropathy (03/24/2016);  Memory disturbance here to follow up.   PLAN: Continue Cymbalta  at current dose will refill Continue gabapentin 3 times a day, refilled by primary care Memory score is stable, patient does not wish to go on any memory medication at this time.  Use  cane at all times for ambulating at risk for falls, continue physical therapy then home exercise program Follow-up in 8 months next with Dr. [name] I spent 25 min  in total face to face time with the patient more than 50% of which was spent counseling and coordination of care, reviewing recent labs,  test results reviewing medications and discussing and reviewing the diagnosis of peripheral neuropathy and his gait disturbance and memory loss Dennie Bible, [facility], [facility], APRN  [facility] [address] [place], [place] [address] [phone]

## 2017-11-25 ENCOUNTER — Encounter: Payer: Self-pay | Admitting: Nurse Practitioner

## 2017-11-25 ENCOUNTER — Telehealth: Payer: Self-pay | Admitting: Family Medicine

## 2017-11-25 ENCOUNTER — Ambulatory Visit (INDEPENDENT_AMBULATORY_CARE_PROVIDER_SITE_OTHER): Payer: Medicare Other | Admitting: Nurse Practitioner

## 2017-11-25 VITALS — BP 126/64 | HR 71 | Wt 171.6 lb

## 2017-11-25 DIAGNOSIS — G609 Hereditary and idiopathic neuropathy, unspecified: Secondary | ICD-10-CM

## 2017-11-25 DIAGNOSIS — R413 Other amnesia: Secondary | ICD-10-CM | POA: Diagnosis not present

## 2017-11-25 DIAGNOSIS — R269 Unspecified abnormalities of gait and mobility: Secondary | ICD-10-CM | POA: Diagnosis not present

## 2017-11-25 DIAGNOSIS — I10 Essential (primary) hypertension: Secondary | ICD-10-CM | POA: Diagnosis not present

## 2017-11-25 DIAGNOSIS — N400 Enlarged prostate without lower urinary tract symptoms: Secondary | ICD-10-CM | POA: Diagnosis not present

## 2017-11-25 MED ORDER — DULOXETINE HCL 30 MG PO CPEP: 30.0000 mg | ORAL_CAPSULE | Freq: Every day | ORAL | 11 refills | Status: DC

## 2017-11-25 NOTE — Patient Instructions (Addendum)
Continue Cymbalta  at current dose will refill Continue gabapentin 3 times a day, refilled by primary care Memory score is stable, patient does not wish to go on any memory medication at this time.  Use  cane at all times for ambulating at risk for falls Follow-up in 8 months next with dr. [name]

## 2017-11-25 NOTE — Telephone Encounter (Signed)
Copied from [place]. Topic: Quick Communication - See Telephone Encounter >> Nov 25, 2017 10:18 AM Burnis Medin, NT wrote: CRM for notification. See Telephone encounter for: Casey Fisher is calling because he wanted the doctor to know he started therapy with the patient.    11/25/17.

## 2017-11-25 NOTE — Progress Notes (Signed)
I have read the note, and I agree with the clinical assessment and plan.  Franki Alcaide K Qiara Minetti   

## 2017-11-30 DIAGNOSIS — N400 Enlarged prostate without lower urinary tract symptoms: Secondary | ICD-10-CM | POA: Diagnosis not present

## 2017-11-30 DIAGNOSIS — I10 Essential (primary) hypertension: Secondary | ICD-10-CM | POA: Diagnosis not present

## 2017-12-02 DIAGNOSIS — I10 Essential (primary) hypertension: Secondary | ICD-10-CM | POA: Diagnosis not present

## 2017-12-02 DIAGNOSIS — N400 Enlarged prostate without lower urinary tract symptoms: Secondary | ICD-10-CM | POA: Diagnosis not present

## 2017-12-08 DIAGNOSIS — I10 Essential (primary) hypertension: Secondary | ICD-10-CM | POA: Diagnosis not present

## 2017-12-08 DIAGNOSIS — N400 Enlarged prostate without lower urinary tract symptoms: Secondary | ICD-10-CM | POA: Diagnosis not present

## 2017-12-09 DIAGNOSIS — I10 Essential (primary) hypertension: Secondary | ICD-10-CM | POA: Diagnosis not present

## 2017-12-09 DIAGNOSIS — N400 Enlarged prostate without lower urinary tract symptoms: Secondary | ICD-10-CM | POA: Diagnosis not present

## 2017-12-14 ENCOUNTER — Encounter: Payer: Self-pay | Admitting: Sports Medicine

## 2017-12-14 ENCOUNTER — Ambulatory Visit (INDEPENDENT_AMBULATORY_CARE_PROVIDER_SITE_OTHER): Payer: Medicare Other | Admitting: Sports Medicine

## 2017-12-14 DIAGNOSIS — M79672 Pain in left foot: Secondary | ICD-10-CM

## 2017-12-14 DIAGNOSIS — G629 Polyneuropathy, unspecified: Secondary | ICD-10-CM

## 2017-12-14 DIAGNOSIS — B351 Tinea unguium: Secondary | ICD-10-CM | POA: Diagnosis not present

## 2017-12-14 DIAGNOSIS — M79671 Pain in right foot: Secondary | ICD-10-CM

## 2017-12-14 NOTE — Progress Notes (Signed)
Patient ID: Casey Fisher, male   DOB: 1928-02-15, 83 y.o.   MRN: [id number]  Subjective: Casey Fisher is a 83 y.o. male patient seen today in office with complaint of painful thickened and elongated toenails; unable to trim. Patient denies any medication changes and has no other pedal complaints at this time.    Patient Active Problem List   Diagnosis Date Noted  . Chest pain 11/08/2017  . Memory difficulty 03/24/2016  . Hereditary and idiopathic peripheral neuropathy 03/24/2016  . Abnormality of gait 03/24/2016  . Peripheral neuropathy 04/18/2015  . Hyperlipidemia 04/18/2015  . Prostate cancer ([place]) 08/24/2014  . Right inguinal hernia 10/30/2013    Current Outpatient Medications on File Prior to Visit  Medication Sig Dispense Refill  . aspirin EC 81 MG tablet Take 81 mg by mouth daily. Reported on 05/14/2016    . Calcium Carb-Cholecalciferol (CALCIUM 600 + D PO) Take 1 tablet by mouth daily.    . DULoxetine (CYMBALTA) 30 MG capsule Take 1 capsule (30 mg total) by mouth daily. 30 capsule 11  . gabapentin (NEURONTIN) 600 MG tablet Take 1 tablet (600 mg total) by mouth 3 (three) times daily. Initially 1 po QD for 1 week, then 1 po BID for 1 week, then 1 po TID. (Patient taking differently: Take 600 mg 3 (three) times daily by mouth. ) 90 tablet 2  . hydrochlorothiazide (HYDRODIURIL) 25 MG tablet take 1 tablet by mouth once daily 90 tablet 1  . Leuprolide Acetate (LUPRON IJ) Inject 1 application as directed every 6 (six) months.    Marland Kitchen RAPAFLO 8 MG CAPS capsule   0  . simvastatin (ZOCOR) 40 MG tablet take 1 tablet by mouth once daily 90 tablet 1  . tamsulosin (FLOMAX) 0.4 MG CAPS capsule Take 0.4 mg daily by mouth. Reported on 05/14/2016  1   No current facility-administered medications on file prior to visit.     Allergies  Allergen Reactions  . Lyrica [Pregabalin] Other (See Comments)    "constipation"    Objective: Physical Exam  General: Well developed, nourished, no  acute distress, awake, alert and oriented x 3  Vascular: Dorsalis pedis artery 2/4 bilateral, Posterior tibial artery 1/4 bilateral, skin temperature warm to warm proximal to distal bilateral lower extremities, + varicosities, Decreased pedal hair present bilateral.  Neurological: Gross sensation present via light touch bilateral. Protective and vibratory sensation decreased bilateral.   Dermatological: Skin is warm, dry, and supple bilateral, Nails 1-10 are tender, long, thick, and discolored with mild subungal debris, no webspace macerations present bilateral, no open lesions present bilateral, no callus/corns/hyperkeratotic tissue present bilateral. No signs of infection bilateral.  Musculoskeletal:  Asymptomatic bunion and hammertoe boney deformities noted bilateral. Muscular strength within normal limits without painon range of motion. No pain with calf compression bilateral.  Assessment and Plan:  Problem List Items Addressed This Visit    None    Visit Diagnoses    Dermatophytosis of nail    -  Primary   Foot pain, bilateral       Neuropathy         -Examined patient.  -Discussed treatment options for painful mycotic nails. -Mechanically debrided and reduced mycotic nails with sterile nail nipper and dremel nail file without incident. -Recommend continue with daily foot inspection in the setting of neuropathy; continue with neuro recs  -Patient to return in 3 months for follow up evaluation or sooner if symptoms worsen.  Landis Martins, DPM

## 2017-12-15 DIAGNOSIS — C61 Malignant neoplasm of prostate: Secondary | ICD-10-CM | POA: Diagnosis not present

## 2017-12-15 DIAGNOSIS — N35012 Post-traumatic membranous urethral stricture: Secondary | ICD-10-CM | POA: Diagnosis not present

## 2017-12-15 DIAGNOSIS — N400 Enlarged prostate without lower urinary tract symptoms: Secondary | ICD-10-CM | POA: Diagnosis not present

## 2017-12-15 DIAGNOSIS — I10 Essential (primary) hypertension: Secondary | ICD-10-CM | POA: Diagnosis not present

## 2017-12-22 DIAGNOSIS — N400 Enlarged prostate without lower urinary tract symptoms: Secondary | ICD-10-CM | POA: Diagnosis not present

## 2017-12-22 DIAGNOSIS — I10 Essential (primary) hypertension: Secondary | ICD-10-CM | POA: Diagnosis not present

## 2018-01-04 ENCOUNTER — Other Ambulatory Visit: Payer: Self-pay

## 2018-01-04 ENCOUNTER — Ambulatory Visit (INDEPENDENT_AMBULATORY_CARE_PROVIDER_SITE_OTHER): Payer: Medicare Other | Admitting: Family Medicine

## 2018-01-04 ENCOUNTER — Encounter: Payer: Self-pay | Admitting: Family Medicine

## 2018-01-04 VITALS — BP 136/72 | HR 71 | Temp 98.4°F | Resp 17 | Ht 75.0 in | Wt 179.2 lb

## 2018-01-04 DIAGNOSIS — I10 Essential (primary) hypertension: Secondary | ICD-10-CM

## 2018-01-04 DIAGNOSIS — G629 Polyneuropathy, unspecified: Secondary | ICD-10-CM

## 2018-01-04 DIAGNOSIS — Z87898 Personal history of other specified conditions: Secondary | ICD-10-CM | POA: Diagnosis not present

## 2018-01-04 DIAGNOSIS — E785 Hyperlipidemia, unspecified: Secondary | ICD-10-CM

## 2018-01-04 DIAGNOSIS — E876 Hypokalemia: Secondary | ICD-10-CM | POA: Diagnosis not present

## 2018-01-04 MED ORDER — HYDROCHLOROTHIAZIDE 25 MG PO TABS: 25.0000 mg | ORAL_TABLET | Freq: Every day | ORAL | 1 refills | Status: DC

## 2018-01-04 MED ORDER — GABAPENTIN 600 MG PO TABS: ORAL_TABLET | ORAL | 1 refills | Status: DC

## 2018-01-04 MED ORDER — SIMVASTATIN 40 MG PO TABS: 40.0000 mg | ORAL_TABLET | Freq: Every day | ORAL | 1 refills | Status: DC

## 2018-01-04 NOTE — Progress Notes (Signed)
Subjective:  By signing my name below, I, Casey Fisher, attest that this documentation has been prepared under the direction and in the presence of [name], MD Electronically Signed: Ladene Artist, ED Scribe 01/04/2018 at 9:49 AM.   Patient ID: Casey Fisher, male    DOB: [birth year], 84 y.o.   MRN: [id number]  Chief Complaint  Patient presents with  . Follow-up    for weight and foot pain   HPI  Casey Fisher is a 84 y.o. male who presents to Primary Care at [facility] for f/u.   Weight Loss Noted 20 lb weight loss from March-Sept of last yr, then improving in Oct. Symptoms had improved with eating more regularly including nutritional supplement once/day. Pt is still using Boost.  Wt Readings from Last 3 Encounters:  01/04/18 179 lb 3.2 oz (81.3 kg)  11/25/17 171 lb 9.6 oz (77.8 kg)  11/08/17 171 lb 4.8 oz (77.7 kg)   Peripheral Neuropathy Long standing symptoms. Previously took gabapentin 600 mg tid but had stopped it. Decided to restart last visit in Oct. Initial low dose with titration up to 600 mg tid. He was seen in Nov by neuro. H/o mild memory disturbance as well but independent ADLs and memory disturbance was stable. Was receiving in home PT. Was continued on Cymbalta. No new memory meds was prescribed. Recommended cane due to fall risk. continued home PT and home exercises. Plan for 8 month f/u with neuro. He is on 30 mg Cymbalta qd.  Pt states that home PT and exercises have significantly improved his posture as he is able to stand more upright. He does not think Cymbalta has improved symptoms as he is still experiencing burning sensation in his feet but did note GI symptoms when he first started medication. He is also taking Gabapentin tid now with very minimal relief. Neuropathy is worse during the day with ambulating. Also reports numbness to the R hand and has recently noticed a tremor with writing at times. Pt is R hand dominant.  Hospitalization  Had a  hospitalization 11/12 for cp. Seen by cardiology. Grade 2 diastolic dysfunction on echo. Continued on HCTZ. EF 55-60%. Evaluated by cardiology. No further stress test recommended. No further chest pains.   HTN Taking HCTZ 25 mg qd.  Lab Results  Component Value Date   CREATININE 0.85 11/08/2017   Hypokalemia  Potassium was 3.3 in Nov.  Health Maintenance  Due for tetanus.  Patient Active Problem List   Diagnosis Date Noted  . Chest pain 11/08/2017  . Memory difficulty 03/24/2016  . Hereditary and idiopathic peripheral neuropathy 03/24/2016  . Abnormality of gait 03/24/2016  . Peripheral neuropathy 04/18/2015  . Hyperlipidemia 04/18/2015  . Prostate cancer ([place]) 08/24/2014  . Right inguinal hernia 10/30/2013   Past Medical History:  Diagnosis Date  . Abnormality of gait 03/24/2016  . Anxiety   . Cancer ([place])   . Cataract   . Depression   . Heart murmur   . Hereditary and idiopathic peripheral neuropathy 03/24/2016  . Hypertension   . Memory difficulty 03/24/2016  . Neuromuscular disorder ([place])   . Neuropathy   . Prostate cancer ([place])    remission   Past Surgical History:  Procedure Laterality Date  . CATARACT EXTRACTION Bilateral   . EYE SURGERY    . HERNIA REPAIR  1985  . INGUINAL HERNIA REPAIR Right 12/06/2013   Procedure: HERNIA REPAIR INGUINAL ADULT;  Surgeon: Merrie Roof, MD;  Location: [place];  Service: General;  Laterality: Right;  . INSERTION OF MESH Right 12/06/2013   Procedure: INSERTION OF MESH;  Surgeon: Merrie Roof, MD;  Location: [place];  Service: General;  Laterality: Right;  . PROSTATE SURGERY     Allergies  Allergen Reactions  . Lyrica [Pregabalin] Other (See Comments)    "constipation"   Prior to Admission medications   Medication Sig Start Date End Date Taking? Authorizing Provider  aspirin EC 81 MG tablet Take 81 mg by mouth daily. Reported on 05/14/2016    [provider]  Calcium Carb-Cholecalciferol (CALCIUM 600 + D PO) Take  1 tablet by mouth daily.    [provider]  DULoxetine (CYMBALTA) 30 MG capsule Take 1 capsule (30 mg total) by mouth daily. 11/25/17   Dennie Bible, NP  gabapentin (NEURONTIN) 600 MG tablet Take 1 tablet (600 mg total) by mouth 3 (three) times daily. Initially 1 po QD for 1 week, then 1 po BID for 1 week, then 1 po TID. Patient taking differently: Take 600 mg 3 (three) times daily by mouth.  09/30/17   [name], MD  hydrochlorothiazide (HYDRODIURIL) 25 MG tablet take 1 tablet by mouth once daily 09/24/17   [name], MD  Leuprolide Acetate (LUPRON IJ) Inject 1 application as directed every 6 (six) months.    [provider]  RAPAFLO 8 MG CAPS capsule  11/09/17   [provider]  simvastatin (ZOCOR) 40 MG tablet take 1 tablet by mouth once daily 09/24/17   [name], MD  tamsulosin (FLOMAX) 0.4 MG CAPS capsule Take 0.4 mg daily by mouth. Reported on 05/14/2016 08/17/15   [provider]   Social History   Socioeconomic History  . Marital status: Widowed    Spouse name: Not on file  . Number of children: Not on file  . Years of education: Not on file  . Highest education level: Not on file  Social Needs  . Financial resource strain: Not on file  . Food insecurity - worry: Not on file  . Food insecurity - inability: Not on file  . Transportation needs - medical: Not on file  . Transportation needs - non-medical: Not on file  Occupational History  . Not on file  Tobacco Use  . Smoking status: Former Research scientist (life sciences)  . Smokeless tobacco: Never Used  Substance and Sexual Activity  . Alcohol use: No  . Drug use: No  . Sexual activity: Not on file  Other Topics Concern  . Not on file  Social History Narrative   Lives home alone.  Retired.  Widowed.  % children.     Review of Systems  Musculoskeletal: Positive for arthralgias.  Neurological: Positive for tremors and numbness.      Objective:   Physical Exam  Constitutional:  He is oriented to person, place, and time. He appears well-developed and well-nourished.  HENT:  Head: Normocephalic and atraumatic.  Eyes: EOM are normal. Pupils are equal, round, and reactive to light.  Neck: No JVD present. Carotid bruit is not present.  Cardiovascular: Normal rate, regular rhythm and normal heart sounds.  No murmur heard. Pulmonary/Chest: Effort normal and breath sounds normal. He has no rales.  Musculoskeletal: He exhibits no edema.  Neurological: He is alert and oriented to person, place, and time.  Ambulates slowly, shuffles steps but without assistance.  Skin: Skin is warm and dry.  Skin intact but some callus at the 1st MTP. Cap refill less than 1  sec bilaterally.  Psychiatric: He has a normal mood and affect.  Vitals reviewed.  Vitals:   01/04/18 0920  BP: 136/72  Pulse: 71  Resp: 17  Temp: 98.4 F (36.9 C)  TempSrc: Oral  SpO2: 98%  Weight: 179 lb 3.2 oz (81.3 kg)  Height: 6\' 3"  (1.905 m)      Assessment & Plan:   Casey Fisher is a 84 y.o. male Peripheral polyneuropathy - Plan: gabapentin (NEURONTIN) 600 MG tablet  - Persistent symptoms, appears to be more affected during the day. Continue Cymbalta same dose as intolerant to higher doses. Trial of higher dose of gabapentin 1200 mg mid afternoon, 600 mg morning and evening.   - Follow-up as planned with neurology. If he does notice new tremors, or other new neurologic symptoms, recommended he call them for follow-up sooner.  - Continue home exercises to help with fall risk as well as cane for angulation assistance.  Essential hypertension - Plan: Basic metabolic panel, hydrochlorothiazide (HYDRODIURIL) 25 MG tablet  - Check labs, continue HCTZ 25 mg daily.  Hypokalemia - Plan: Basic metabolic panel  - Recheck on BMP.  History of chest pain  - Asymptomatic currently, previous evaluation in ER reassuring.  Hyperlipidemia, unspecified hyperlipidemia type - Plan: simvastatin (ZOCOR) 40 MG  tablet  - Tolerating simvastatin. Continue same dose for now. Can recheck lab work next visit.   Meds ordered this encounter  Medications  . gabapentin (NEURONTIN) 600 MG tablet    Sig: 1 po QAM, 2 po midday, 1 po QPM    Dispense:  360 tablet    Refill:  1  . simvastatin (ZOCOR) 40 MG tablet    Sig: Take 1 tablet (40 mg total) by mouth daily.    Dispense:  90 tablet    Refill:  1  . hydrochlorothiazide (HYDRODIURIL) 25 MG tablet    Sig: Take 1 tablet (25 mg total) by mouth daily.    Dispense:  90 tablet    Refill:  1   Patient Instructions   Continue the exercises from physical therapy. Follow up here or emergency room if needed for any return of chest pains.   Weight appears stable. Continue healthy diet and nutritional supplement if needed.   Continue Cymbalta same dose. Can try 2 pills of the gabapentin at one of the doses during the day, then 1 pill at other times as you have been doing.  If that is not helping the nerve pain in your feet, we can continue to increase the gabapentin dose. Let me know or follow up if pain is not improving.   Blood pressure appears controlled. I will check some electrolytes today as your potassium was borderline low in the hospital.  If any new movement issues, tremors or worsening nerve pain I would recommend meeting with your neurologist.    Thank you for coming in today. Please follow-up with me in 6 months. Sooner if needed   IF you received an x-ray today, you will receive an invoice from [facility] Radiology. Please contact [facility] Radiology at [phone] with questions or concerns regarding your invoice.   IF you received labwork today, you will receive an invoice from [place]. Please contact [facility] at [phone] with questions or concerns regarding your invoice.   Our billing staff will not be able to assist you with questions regarding bills from these companies.  You will be contacted with the lab results as soon as they are  available. The fastest way to get your  results is to activate your My Chart account. Instructions are located on the last page of this paperwork. If you have not heard from [place] regarding the results in 2 weeks, please contact this office.    I personally performed the services described in this documentation, which was scribed in my presence. The recorded information has been reviewed and considered for accuracy and completeness, addended by me as needed, and agree with information above.  Signed,   Merri Ray, MD Primary Care at [facility].  01/04/18 10:33 AM

## 2018-01-04 NOTE — Patient Instructions (Addendum)
Continue the exercises from physical therapy. Follow up here or emergency room if needed for any return of chest pains.   Weight appears stable. Continue healthy diet and nutritional supplement if needed.   Continue Cymbalta same dose. Can try 2 pills of the gabapentin at one of the doses during the day, then 1 pill at other times as you have been doing.  If that is not helping the nerve pain in your feet, we can continue to increase the gabapentin dose. Let me know or follow up if pain is not improving.   Blood pressure appears controlled. I will check some electrolytes today as your potassium was borderline low in the hospital.  If any new movement issues, tremors or worsening nerve pain I would recommend meeting with your neurologist.    Thank you for coming in today. Please follow-up with me in 6 months. Sooner if needed   IF you received an x-ray today, you will receive an invoice from [facility]. Please contact [facility] Radiology at [phone] with questions or concerns regarding your invoice.   IF you received labwork today, you will receive an invoice from [place]. Please contact [facility] at [phone] with questions or concerns regarding your invoice.   Our billing staff will not be able to assist you with questions regarding bills from these companies.  You will be contacted with the lab results as soon as they are available. The fastest way to get your results is to activate your My Chart account. Instructions are located on the last page of this paperwork. If you have not heard from [place] regarding the results in 2 weeks, please contact this office.

## 2018-01-05 LAB — BASIC METABOLIC PANEL
BUN / CREAT RATIO: 24 (ref 10–24)
BUN: 19 mg/dL (ref 8–27)
CHLORIDE: 102 mmol/L (ref 96–106)
CO2: 25 mmol/L (ref 20–29)
Calcium: 9.4 mg/dL (ref 8.6–10.2)
Creatinine, Ser: 0.78 mg/dL (ref 0.76–1.27)
GFR calc Af Amer: 94 mL/min/{1.73_m2} (ref >59)
GFR calc non Af Amer: 81 mL/min/{1.73_m2} (ref >59)
Glucose: 90 mg/dL (ref 65–99)
POTASSIUM: 4 mmol/L (ref 3.5–5.2)
SODIUM: 142 mmol/L (ref 134–144)

## 2018-01-11 ENCOUNTER — Encounter: Payer: Self-pay | Admitting: *Deleted

## 2018-01-24 ENCOUNTER — Encounter: Payer: Self-pay | Admitting: Neurology

## 2018-01-24 DIAGNOSIS — C61 Malignant neoplasm of prostate: Secondary | ICD-10-CM | POA: Diagnosis not present

## 2018-03-08 ENCOUNTER — Ambulatory Visit: Payer: Medicare Other | Admitting: Sports Medicine

## 2018-03-22 ENCOUNTER — Encounter: Payer: Self-pay | Admitting: Sports Medicine

## 2018-03-22 ENCOUNTER — Ambulatory Visit (INDEPENDENT_AMBULATORY_CARE_PROVIDER_SITE_OTHER): Payer: Medicare Other | Admitting: Sports Medicine

## 2018-03-22 DIAGNOSIS — B351 Tinea unguium: Secondary | ICD-10-CM | POA: Diagnosis not present

## 2018-03-22 DIAGNOSIS — M79672 Pain in left foot: Secondary | ICD-10-CM | POA: Diagnosis not present

## 2018-03-22 DIAGNOSIS — M79671 Pain in right foot: Secondary | ICD-10-CM | POA: Diagnosis not present

## 2018-03-22 DIAGNOSIS — G629 Polyneuropathy, unspecified: Secondary | ICD-10-CM

## 2018-03-22 NOTE — Progress Notes (Signed)
Patient ID: Casey Fisher, male   DOB: 11/21/1928, 84 y.o.   MRN: [id number]  Subjective: Casey Fisher is a 84 y.o. male patient seen today in office with complaint of painful thickened and elongated toenails; unable to trim. Patient denies any other pedal complaints at this time.    Patient Active Problem List   Diagnosis Date Noted  . Chest pain 11/08/2017  . Memory difficulty 03/24/2016  . Hereditary and idiopathic peripheral neuropathy 03/24/2016  . Abnormality of gait 03/24/2016  . Peripheral neuropathy 04/18/2015  . Hyperlipidemia 04/18/2015  . Prostate cancer ([place]) 08/24/2014  . Right inguinal hernia 10/30/2013    Current Outpatient Medications on File Prior to Visit  Medication Sig Dispense Refill  . aspirin EC 81 MG tablet Take 81 mg by mouth daily. Reported on 05/14/2016    . Calcium Carb-Cholecalciferol (CALCIUM 600 + D PO) Take 1 tablet by mouth daily.    . DULoxetine (CYMBALTA) 30 MG capsule Take 1 capsule (30 mg total) by mouth daily. 30 capsule 11  . gabapentin (NEURONTIN) 600 MG tablet 1 po QAM, 2 po midday, 1 po QPM 360 tablet 1  . hydrochlorothiazide (HYDRODIURIL) 25 MG tablet Take 1 tablet (25 mg total) by mouth daily. 90 tablet 1  . Leuprolide Acetate (LUPRON IJ) Inject 1 application as directed every 6 (six) months.    Marland Kitchen RAPAFLO 8 MG CAPS capsule   0  . simvastatin (ZOCOR) 40 MG tablet Take 1 tablet (40 mg total) by mouth daily. 90 tablet 1  . tamsulosin (FLOMAX) 0.4 MG CAPS capsule Take 0.4 mg daily by mouth. Reported on 05/14/2016  1   No current facility-administered medications on file prior to visit.     Allergies  Allergen Reactions  . Lyrica [Pregabalin] Other (See Comments)    "constipation"    Objective: Physical Exam  General: Well developed, nourished, no acute distress, awake, alert and oriented x 3  Vascular: Dorsalis pedis artery 2/4 bilateral, Posterior tibial artery 1/4 bilateral, skin temperature warm to warm proximal to distal  bilateral lower extremities, + varicosities, Decreased pedal hair present bilateral.  Neurological: Gross sensation present via light touch bilateral. Protective and vibratory sensation decreased bilateral.   Dermatological: Skin is warm, dry, and supple bilateral, Nails 1-10 are tender, long, thick, and discolored with mild subungal debris, no webspace macerations present bilateral, no open lesions present bilateral, no callus/corns/hyperkeratotic tissue present bilateral. No signs of infection bilateral.  Musculoskeletal:  Asymptomatic bunion and hammertoe boney deformities noted bilateral. Muscular strength within normal limits without painon range of motion. No pain with calf compression bilateral.  Assessment and Plan:  Problem List Items Addressed This Visit    None    Visit Diagnoses    Dermatophytosis of nail    -  Primary   Foot pain, bilateral       Neuropathy         -Examined patient.  -Discussed treatment options for painful mycotic nails. -ABN signed  -Mechanically debrided and reduced mycotic nails with sterile nail nipper and dremel nail file without incident. -Patient to return in 3 months for follow up evaluation or sooner if symptoms worsen.  Landis Martins, DPM

## 2018-05-30 ENCOUNTER — Other Ambulatory Visit: Payer: Self-pay | Admitting: Family Medicine

## 2018-05-30 DIAGNOSIS — G629 Polyneuropathy, unspecified: Secondary | ICD-10-CM

## 2018-05-31 NOTE — Telephone Encounter (Signed)
Please advise on refill  Last seen 01/14/2018  Plan--"Peripheral polyneuropathy - Plan: gabapentin (NEURONTIN) 600 MG tablet             - Persistent symptoms, appears to be more affected during the day. Continue Cymbalta same dose as intolerant to higher doses. Trial of higher dose of gabapentin 1200 mg mid afternoon, 600 mg morning and evening.              - Follow-up as planned with neurology. If he does notice new tremors, or other new neurologic symptoms, recommended he call them for follow-up sooner.             - Continue home exercises to help with fall risk as well as cane for angulation assistance."   Last filled January

## 2018-06-07 ENCOUNTER — Telehealth: Payer: Self-pay | Admitting: Family Medicine

## 2018-06-07 NOTE — Telephone Encounter (Signed)
Copied from [place] [id number]. Topic: Quick Communication - Rx Refill/Question >> Jun 07, 2018  6:22 PM Casey Fisher wrote: Medication: gabapentin (NEURONTIN) 600 MG tablet [[id number]]   Has the patient contacted their pharmacy? Yes.   (Agent: If no, request that the patient contact the pharmacy for the refill.) (Agent: If yes, when and what did the pharmacy advise?)  Preferred Pharmacy (with phone number or street name): [facility]  Agent: Please be advised that RX refills may take up to 3 business days. We ask that you follow-up with your pharmacy.

## 2018-06-08 NOTE — Telephone Encounter (Signed)
Neurontin refilled on 05/31/18.

## 2018-06-22 DIAGNOSIS — N5201 Erectile dysfunction due to arterial insufficiency: Secondary | ICD-10-CM | POA: Diagnosis not present

## 2018-06-22 DIAGNOSIS — C61 Malignant neoplasm of prostate: Secondary | ICD-10-CM | POA: Diagnosis not present

## 2018-06-22 DIAGNOSIS — N35012 Post-traumatic membranous urethral stricture: Secondary | ICD-10-CM | POA: Diagnosis not present

## 2018-06-28 ENCOUNTER — Encounter: Payer: Self-pay | Admitting: Sports Medicine

## 2018-06-28 ENCOUNTER — Ambulatory Visit (INDEPENDENT_AMBULATORY_CARE_PROVIDER_SITE_OTHER): Payer: Medicare Other | Admitting: Sports Medicine

## 2018-06-28 DIAGNOSIS — B351 Tinea unguium: Secondary | ICD-10-CM | POA: Diagnosis not present

## 2018-06-28 DIAGNOSIS — M79672 Pain in left foot: Secondary | ICD-10-CM

## 2018-06-28 DIAGNOSIS — G629 Polyneuropathy, unspecified: Secondary | ICD-10-CM

## 2018-06-28 DIAGNOSIS — M79671 Pain in right foot: Secondary | ICD-10-CM | POA: Diagnosis not present

## 2018-06-28 NOTE — Progress Notes (Signed)
Patient ID: JAVARIS WIGINGTON, male   DOB: [birth year], 84 y.o.   MRN: [id number]  Subjective: Casey Fisher is a 84 y.o. male patient seen today in office with complaint of painful thickened and elongated toenails; unable to trim. Patient states that there was a change to his urology medication but does not remember what it is. Patient denies any other pedal complaints at this time.    Patient Active Problem List   Diagnosis Date Noted  . Chest pain 11/08/2017  . Memory difficulty 03/24/2016  . Hereditary and idiopathic peripheral neuropathy 03/24/2016  . Abnormality of gait 03/24/2016  . Peripheral neuropathy 04/18/2015  . Hyperlipidemia 04/18/2015  . Prostate cancer ([place]) 08/24/2014  . Right inguinal hernia 10/30/2013    Current Outpatient Medications on File Prior to Visit  Medication Sig Dispense Refill  . aspirin EC 81 MG tablet Take 81 mg by mouth daily. Reported on 05/14/2016    . Calcium Carb-Cholecalciferol (CALCIUM 600 + D PO) Take 1 tablet by mouth daily.    . DULoxetine (CYMBALTA) 30 MG capsule Take 1 capsule (30 mg total) by mouth daily. 30 capsule 11  . gabapentin (NEURONTIN) 600 MG tablet TAKE 1 TABLET IN THE MORNING 2 AT MIDDAY AND 1 IN THE EVENING 360 tablet 0  . hydrochlorothiazide (HYDRODIURIL) 25 MG tablet Take 1 tablet (25 mg total) by mouth daily. 90 tablet 1  . Leuprolide Acetate (LUPRON IJ) Inject 1 application as directed every 6 (six) months.    Marland Kitchen RAPAFLO 8 MG CAPS capsule   0  . simvastatin (ZOCOR) 40 MG tablet Take 1 tablet (40 mg total) by mouth daily. 90 tablet 1  . tamsulosin (FLOMAX) 0.4 MG CAPS capsule Take 0.4 mg daily by mouth. Reported on 05/14/2016  1   No current facility-administered medications on file prior to visit.     Allergies  Allergen Reactions  . Lyrica [Pregabalin] Other (See Comments)    "constipation"    Objective: Physical Exam  General: Well developed, nourished, no acute distress, awake, alert and oriented x  3  Vascular: Dorsalis pedis artery 2/4 bilateral, Posterior tibial artery 1/4 bilateral, skin temperature warm to warm proximal to distal bilateral lower extremities, + varicosities, Decreased pedal hair present bilateral.  Neurological: Gross sensation present via light touch bilateral. Protective and vibratory sensation decreased bilateral.   Dermatological: Skin is warm, dry, and supple bilateral, Nails 1-10 are tender, long, thick, and discolored with mild subungal debris, no webspace macerations present bilateral, no open lesions present bilateral, no callus/corns/hyperkeratotic tissue present bilateral. No signs of infection bilateral.  Musculoskeletal:  Asymptomatic bunion and hammertoe boney deformities noted bilateral. Muscular strength within normal limits without painon range of motion. No pain with calf compression bilateral.  Assessment and Plan:  Problem List Items Addressed This Visit    None    Visit Diagnoses    Dermatophytosis of nail    -  Primary   Foot pain, bilateral       Neuropathy         -Examined patient.  -Discussed treatment options for painful mycotic nails. -Mechanically debrided and reduced mycotic nails with sterile nail nipper and dremel nail file without incident. -Patient to return in 3 months for follow up evaluation or sooner if symptoms worsen.  Landis Martins, DPM

## 2018-07-05 ENCOUNTER — Ambulatory Visit: Payer: Medicare Other | Admitting: Family Medicine

## 2018-07-06 ENCOUNTER — Ambulatory Visit: Payer: Medicare Other | Admitting: Neurology

## 2018-07-11 ENCOUNTER — Other Ambulatory Visit: Payer: Self-pay

## 2018-07-11 ENCOUNTER — Encounter: Payer: Self-pay | Admitting: Family Medicine

## 2018-07-11 ENCOUNTER — Ambulatory Visit (INDEPENDENT_AMBULATORY_CARE_PROVIDER_SITE_OTHER): Payer: Medicare Other | Admitting: Family Medicine

## 2018-07-11 VITALS — BP 136/58 | HR 69 | Temp 98.4°F | Ht 75.0 in | Wt 172.0 lb

## 2018-07-11 DIAGNOSIS — H9193 Unspecified hearing loss, bilateral: Secondary | ICD-10-CM | POA: Diagnosis not present

## 2018-07-11 DIAGNOSIS — I10 Essential (primary) hypertension: Secondary | ICD-10-CM

## 2018-07-11 DIAGNOSIS — E785 Hyperlipidemia, unspecified: Secondary | ICD-10-CM | POA: Diagnosis not present

## 2018-07-11 DIAGNOSIS — G629 Polyneuropathy, unspecified: Secondary | ICD-10-CM

## 2018-07-11 LAB — COMPREHENSIVE METABOLIC PANEL
A/G RATIO: 1.3 (ref 1.2–2.2)
ALK PHOS: 65 IU/L (ref 39–117)
ALT: 10 IU/L (ref 0–44)
AST: 19 IU/L (ref 0–40)
Albumin: 4 g/dL (ref 3.5–4.7)
BILIRUBIN TOTAL: 0.4 mg/dL (ref 0.0–1.2)
BUN/Creatinine Ratio: 12 (ref 10–24)
BUN: 11 mg/dL (ref 8–27)
CHLORIDE: 103 mmol/L (ref 96–106)
CO2: 25 mmol/L (ref 20–29)
Calcium: 9.3 mg/dL (ref 8.6–10.2)
Creatinine, Ser: 0.95 mg/dL (ref 0.76–1.27)
GFR calc non Af Amer: 71 mL/min/{1.73_m2} (ref >59)
GFR, EST AFRICAN AMERICAN: 82 mL/min/{1.73_m2} (ref >59)
Globulin, Total: 3.2 g/dL (ref 1.5–4.5)
Glucose: 95 mg/dL (ref 65–99)
POTASSIUM: 3.4 mmol/L — AB (ref 3.5–5.2)
Sodium: 140 mmol/L (ref 134–144)
Total Protein: 7.2 g/dL (ref 6.0–8.5)

## 2018-07-11 LAB — LIPID PANEL
CHOLESTEROL TOTAL: 174 mg/dL (ref 100–199)
Chol/HDL Ratio: 3.2 ratio (ref 0.0–5.0)
HDL: 55 mg/dL (ref >39)
LDL Calculated: 103 mg/dL — ABNORMAL HIGH (ref 0–99)
Triglycerides: 79 mg/dL (ref 0–149)
VLDL CHOLESTEROL CAL: 16 mg/dL (ref 5–40)

## 2018-07-11 MED ORDER — SIMVASTATIN 40 MG PO TABS: 40.0000 mg | ORAL_TABLET | Freq: Every day | ORAL | 1 refills | Status: DC

## 2018-07-11 MED ORDER — GABAPENTIN 600 MG PO TABS: ORAL_TABLET | ORAL | 1 refills | Status: DC

## 2018-07-11 MED ORDER — HYDROCHLOROTHIAZIDE 25 MG PO TABS: 25.0000 mg | ORAL_TABLET | Freq: Every day | ORAL | 1 refills | Status: DC

## 2018-07-11 NOTE — Patient Instructions (Addendum)
For hearing issue, I will refer you to audiology for hearing screen.   No change in blood pressure meds today.   For nerve pain, I would recommend discussing the Cymbalta with neurology. I would also like neurology to evaluate your walking and decide if any other change in meds or testing needed.   You can try taking 2 gabapentin in the morning and at lunch, 1 at bedtime for now. If any lightheadedness/dizziness or new side effects, return to previous dose.     IF you received an x-ray today, you will receive an invoice from [facility] Radiology. Please contact [facility] Radiology at [phone] with questions or concerns regarding your invoice.   IF you received labwork today, you will receive an invoice from [place]. Please contact [facility] at [phone] with questions or concerns regarding your invoice.   Our billing staff will not be able to assist you with questions regarding bills from these companies.  You will be contacted with the lab results as soon as they are available. The fastest way to get your results is to activate your My Chart account. Instructions are located on the last page of this paperwork. If you have not heard from [place] regarding the results in 2 weeks, please contact this office.

## 2018-07-11 NOTE — Progress Notes (Signed)
Subjective:  By signing my name below, I, Casey Fisher, attest that this documentation has been prepared under the direction and in the presence of Merri Ray, MD. Electronically Signed: [name], [place]. 07/11/2018 , 9:33 AM .  Patient was seen in Room 10 .   Patient ID: Casey Fisher, male    DOB: [birth year], 84 y.o.   MRN: [id number] Chief Complaint  Patient presents with  . Fisher Pressure Check   HPI Casey Fisher is a 84 y.o. male  Patient was last seen on Jan 8th. He mentions he will see his eye doctor in a few weeks.   Hearing difficulties He mentions he's been having trouble hearing in both ears for a while now. He denies any recent hearing screening or testing. He believes there might be wax build up.   Peripheral neuropathy See previous notes. This has been a long standing issue. He is seen by neurologist, next appointment with neurology on Aug 2nd. He's received at-home PT. He's previously tried Cymbalta with minimal relief. He was continued on gabapentin 600 mg in the mornings, recommended additional dose during the day at 1200 mg, and then 600 mg at night, with option of increase in dosing. He does have a history of mild memory disturbance, see previous notes. He had a normal B-12 in 2017.   Patient reports taking gabapentin 600 mg in the morning, 1200 mg after lunch, and then 600 mg in the evening, as well as Cymbalta. He's still having problems with nerve pain, which bothers him about the same throughout the day. He noticed he hasn't been walking properly, and not walking heel to toe. He has a walker at home, but in his house, he doesn't have enough room for a walker; he's able to hold onto things at home. He denies any falls. He denies any lightheadedness or dizziness.   HTN BP Readings from Last 3 Encounters:  07/11/18 (!) 136/58  01/04/18 136/72  11/25/17 126/64   Lab Results  Component Value Date   CREATININE 0.78 01/04/2018   He takes HCTZ 25  mg qd. He denies any side effects including chest pain, lightheadedness, dizziness or Fisher in stool.   Hyperlipidemia Lab Results  Component Value Date   CHOL 139 11/08/2017   HDL 60 11/08/2017   LDLCALC 73 11/08/2017   TRIG 29 11/08/2017   CHOLHDL 2.3 11/08/2017   Lab Results  Component Value Date   ALT 9 09/16/2017   AST 18 09/16/2017   ALKPHOS 72 09/16/2017   BILITOT 0.6 09/16/2017   He takes Zocor 40 mg qd.   Patient Active Problem List   Diagnosis Date Noted  . Chest pain 11/08/2017  . Memory difficulty 03/24/2016  . Hereditary and idiopathic peripheral neuropathy 03/24/2016  . Abnormality of gait 03/24/2016  . Peripheral neuropathy 04/18/2015  . Hyperlipidemia 04/18/2015  . Prostate cancer ([place]) 08/24/2014  . Right inguinal hernia 10/30/2013   Past Medical History:  Diagnosis Date  . Abnormality of gait 03/24/2016  . Anxiety   . Cancer ([place])   . Cataract   . Depression   . Heart murmur   . Hereditary and idiopathic peripheral neuropathy 03/24/2016  . Hypertension   . Memory difficulty 03/24/2016  . Neuromuscular disorder ([place])   . Neuropathy   . Prostate cancer ([place])    remission   Past Surgical History:  Procedure Laterality Date  . CATARACT EXTRACTION Bilateral   . EYE SURGERY    . HERNIA REPAIR  1985  .  INGUINAL HERNIA REPAIR Right 12/06/2013   Procedure: HERNIA REPAIR INGUINAL ADULT;  Surgeon: Merrie Roof, MD;  Location: [facility];  Service: General;  Laterality: Right;  . INSERTION OF MESH Right 12/06/2013   Procedure: INSERTION OF MESH;  Surgeon: Merrie Roof, MD;  Location: [facility];  Service: General;  Laterality: Right;  . PROSTATE SURGERY     Allergies  Allergen Reactions  . Lyrica [Pregabalin] Other (See Comments)    "constipation"   Prior to Admission medications   Medication Sig Start Date End Date Taking? Authorizing Provider  aspirin EC 81 MG tablet Take 81 mg by mouth daily. Reported on 05/14/2016   Yes [provider]    Calcium Carb-Cholecalciferol (CALCIUM 600 + D PO) Take 1 tablet by mouth daily.   Yes [provider]  DULoxetine (CYMBALTA) 30 MG capsule Take 1 capsule (30 mg total) by mouth daily. 11/25/17  Yes Dennie Bible, NP  ERLEADA 60 MG tablet  06/06/18  Yes [provider]  gabapentin (NEURONTIN) 600 MG tablet TAKE 1 TABLET IN THE MORNING 2 AT MIDDAY AND 1 IN THE EVENING 05/31/18  Yes [name], MD  hydrochlorothiazide (HYDRODIURIL) 25 MG tablet Take 1 tablet (25 mg total) by mouth daily. 01/04/18  Yes [name], MD  Leuprolide Acetate (LUPRON IJ) Inject 1 application as directed every 6 (six) months.   Yes [provider]  RAPAFLO 8 MG CAPS capsule  11/09/17  Yes [provider]  simvastatin (ZOCOR) 40 MG tablet Take 1 tablet (40 mg total) by mouth daily. 01/04/18  Yes [name], MD  tamsulosin (FLOMAX) 0.4 MG CAPS capsule Take 0.4 mg daily by mouth. Reported on 05/14/2016 08/17/15  Yes [provider]   Social History   Socioeconomic History  . Marital status: Widowed    Spouse name: Not on file  . Number of children: Not on file  . Years of education: Not on file  . Highest education level: Not on file  Occupational History  . Not on file  Social Needs  . Financial resource strain: Not on file  . Food insecurity:    Worry: Not on file    Inability: Not on file  . Transportation needs:    Medical: Not on file    Non-medical: Not on file  Tobacco Use  . Smoking status: Former Research scientist (life sciences)  . Smokeless tobacco: Never Used  Substance and Sexual Activity  . Alcohol use: No  . Drug use: No  . Sexual activity: Not on file  Lifestyle  . Physical activity:    Days per week: Not on file    Minutes per session: Not on file  . Stress: Not on file  Relationships  . Social connections:    Talks on phone: Not on file    Gets together: Not on file    Attends religious service: Not on file    Active member of club or  organization: Not on file    Attends meetings of clubs or organizations: Not on file    Relationship status: Not on file  . Intimate partner violence:    Fear of current or ex partner: Not on file    Emotionally abused: Not on file    Physically abused: Not on file    Forced sexual activity: Not on file  Other Topics Concern  . Not on file  Social History Narrative   Lives home alone.  Retired.  Widowed.  %  children.     Review of Systems  Constitutional: Negative for fatigue and unexpected weight change.  HENT: Positive for hearing loss (decreased).   Eyes: Negative for visual disturbance.  Respiratory: Negative for cough, chest tightness and shortness of breath.   Cardiovascular: Negative for chest pain, palpitations and leg swelling.  Gastrointestinal: Negative for abdominal pain and Fisher in stool.  Neurological: Negative for dizziness, light-headedness and headaches.       Objective:   Physical Exam  Constitutional: He is oriented to person, place, and time. He appears well-developed and well-nourished.  HENT:  Head: Normocephalic and atraumatic.  Left ear: excess cerumen on the left, not completely obstructed, able to visualize TM Right ear: excess cerumen on the right, not completely obstructed, visualized TM appears pearly gray  Eyes: Pupils are equal, round, and reactive to light. EOM are normal.  Neck: No JVD present. Carotid bruit is not present.  Cardiovascular: Normal rate, regular rhythm and normal heart sounds.  No murmur heard. Pulmonary/Chest: Effort normal and breath sounds normal. He has no rales.  Musculoskeletal: He exhibits no edema (no lower extremity edema).  Neurological: He is alert and oriented to person, place, and time.  Skin: Skin is warm and dry.  Psychiatric: He has a normal mood and affect.  Vitals reviewed.   Vitals:   07/11/18 0859  BP: (!) 136/58  Pulse: 69  Temp: 98.4 F (36.9 C)  TempSrc: Oral  SpO2: 96%  Weight: 172 lb (78 kg)    Height: 6\' 3"  (1.905 m)       Assessment & Plan:   Casey Fisher is a 84 y.o. male Essential hypertension - Plan: hydrochlorothiazide (HYDRODIURIL) 25 MG tablet  -Stable.  Tolerating current dose of HCTZ, check labs.  Peripheral polyneuropathy - Plan: gabapentin (NEURONTIN) 600 MG tablet  -Persistent symptoms.  Has upcoming follow-up with neurology, and can evaluate the gait further at that time.  Will try to increase his gabapentin to 1200 mg in the morning, continue 1200 mg in the afternoon continue 600 mg at night.  RTC precautions if new side effects or worsening symptoms.  Hearing difficulty of both ears - Plan: Ambulatory referral to Audiology  -Cerumen present, but not true impaction.  -We will refer to audiology for further evaluation  Hyperlipidemia, unspecified hyperlipidemia type - Plan: Comprehensive metabolic panel, Lipid panel, simvastatin (ZOCOR) 40 MG tablet  -Check labs, continue Zocor same dose.  Meds ordered this encounter  Medications  . gabapentin (NEURONTIN) 600 MG tablet    Sig: TAKE 2 TABLET IN THE MORNING 2 AT MIDDAY AND 1 IN THE EVENING    Dispense:  450 tablet    Refill:  1  . simvastatin (ZOCOR) 40 MG tablet    Sig: Take 1 tablet (40 mg total) by mouth daily.    Dispense:  90 tablet    Refill:  1  . hydrochlorothiazide (HYDRODIURIL) 25 MG tablet    Sig: Take 1 tablet (25 mg total) by mouth daily.    Dispense:  90 tablet    Refill:  1   Patient Instructions   For hearing issue, I will refer you to audiology for hearing screen.   No change in Fisher pressure meds today.   For nerve pain, I would recommend discussing the Cymbalta with neurology. I would also like neurology to evaluate your walking and decide if any other change in meds or testing needed.   You can try taking 2 gabapentin in the morning and at lunch,  1 at bedtime for now. If any lightheadedness/dizziness or new side effects, return to previous dose.     IF you received an  x-ray today, you will receive an invoice from [facility]. Please contact [facility] at [phone] with questions or concerns regarding your invoice.   IF you received labwork today, you will receive an invoice from [facility]. Please contact [facility] at [phone] with questions or concerns regarding your invoice.   Our billing staff will not be able to assist you with questions regarding bills from these companies.  You will be contacted with the lab results as soon as they are available. The fastest way to get your results is to activate your My Chart account. Instructions are located on the last page of this paperwork. If you have not heard from [place] regarding the results in 2 weeks, please contact this office.       I personally performed the services described in this documentation, which was scribed in my presence. The recorded information has been reviewed and considered for accuracy and completeness, addended by me as needed, and agree with information above.  Signed,   Merri Ray, MD [facility].  07/11/18 9:38 AM

## 2018-07-27 ENCOUNTER — Telehealth: Payer: Self-pay | Admitting: Family Medicine

## 2018-07-27 NOTE — Telephone Encounter (Signed)
Copied from [place] [id number]. Topic: Quick Communication - Lab Results >> Jul 27, 2018  9:15 AM Burnis Kingfisher, CMA wrote: Called patient to inform them of  lab results. When patient returns call, triage nurse may disclose results.

## 2018-07-27 NOTE — Telephone Encounter (Signed)
Left message on voicemail for pt to return call to office for results.    

## 2018-07-27 NOTE — Telephone Encounter (Signed)
Pt returned call and lab results given to him with recommendations. Pt voiced understanding. He wants to be notified to have his 6 week follow up labs.

## 2018-07-27 NOTE — Telephone Encounter (Signed)
Pt would like a call back about results. Please call back

## 2018-07-27 NOTE — Telephone Encounter (Deleted)
Copied from [place] [id number]. Topic: Quick Communication - Lab Results >> Jul 27, 2018  9:15 AM Casey Fisher, CMA wrote: Called patient to inform them of  lab results. When patient returns call, triage nurse may disclose results.

## 2018-07-29 ENCOUNTER — Ambulatory Visit: Payer: Medicare Other | Admitting: Neurology

## 2018-07-29 ENCOUNTER — Telehealth: Payer: Self-pay | Admitting: Neurology

## 2018-07-29 NOTE — Telephone Encounter (Signed)
This patient did not show for a revisit appointment today. 

## 2018-08-01 ENCOUNTER — Encounter: Payer: Self-pay | Admitting: Neurology

## 2018-08-05 DIAGNOSIS — H9312 Tinnitus, left ear: Secondary | ICD-10-CM | POA: Diagnosis not present

## 2018-08-05 DIAGNOSIS — Z570 Occupational exposure to noise: Secondary | ICD-10-CM | POA: Diagnosis not present

## 2018-08-05 DIAGNOSIS — H903 Sensorineural hearing loss, bilateral: Secondary | ICD-10-CM | POA: Diagnosis not present

## 2018-08-23 ENCOUNTER — Ambulatory Visit (INDEPENDENT_AMBULATORY_CARE_PROVIDER_SITE_OTHER): Payer: Medicare Other | Admitting: Neurology

## 2018-08-23 ENCOUNTER — Encounter: Payer: Self-pay | Admitting: Neurology

## 2018-08-23 VITALS — BP 128/70 | HR 61 | Ht 75.0 in | Wt 168.5 lb

## 2018-08-23 DIAGNOSIS — G6289 Other specified polyneuropathies: Secondary | ICD-10-CM

## 2018-08-23 DIAGNOSIS — R413 Other amnesia: Secondary | ICD-10-CM | POA: Diagnosis not present

## 2018-08-23 DIAGNOSIS — R269 Unspecified abnormalities of gait and mobility: Secondary | ICD-10-CM

## 2018-08-23 NOTE — Progress Notes (Signed)
Reason for visit: Peripheral neuropathy, memory disturbance  Casey Fisher is an 84 y.o. male  History of present illness:  Casey Fisher is an 84 year old right-handed white male with a history of a peripheral neuropathy associated with a gait disorder.  The patient has not had any falls while walking, he does report that he fell once out of bed when he was reaching to pick up something on the end table and he fell out of bed.  The patient uses a cane or walker for ambulation.  He does have a peripheral neuropathy with some discomfort, his gabapentin dose was increased to 1200 mg twice during the day and 600 mg at night.  He indicates that the dose increase did not result in any significant change in his balance.  The patient denies that he has any troubles with memory.  He is able to manage his own finances, medications, he is operating a motor vehicle without difficulty.  He returns for an evaluation.  Past Medical History:  Diagnosis Date  . Abnormality of gait 03/24/2016  . Anxiety   . Cancer ([place])   . Cataract   . Depression   . Heart murmur   . Hereditary and idiopathic peripheral neuropathy 03/24/2016  . Hypertension   . Memory difficulty 03/24/2016  . Neuromuscular disorder ([place])   . Neuropathy   . Prostate cancer ([place])    remission    Past Surgical History:  Procedure Laterality Date  . CATARACT EXTRACTION Bilateral   . EYE SURGERY    . HERNIA REPAIR  1985  . INGUINAL HERNIA REPAIR Right 12/06/2013   Procedure: HERNIA REPAIR INGUINAL ADULT;  Surgeon: Merrie Roof, MD;  Location: [facility];  Service: General;  Laterality: Right;  . INSERTION OF MESH Right 12/06/2013   Procedure: INSERTION OF MESH;  Surgeon: Merrie Roof, MD;  Location: [facility];  Service: General;  Laterality: Right;  . PROSTATE SURGERY      Family History  Problem Relation Age of Onset  . Brain cancer Daughter   . Heart disease Mother   . Stroke Mother   . Heart disease Father     Social  history:  reports that he has quit smoking. He has never used smokeless tobacco. He reports that he does not drink alcohol or use drugs.    Allergies  Allergen Reactions  . Lyrica [Pregabalin] Other (See Comments)    "constipation"    Medications:  Prior to Admission medications   Medication Sig Start Date End Date Taking? Authorizing Provider  aspirin EC 81 MG tablet Take 81 mg by mouth daily. Reported on 05/14/2016   Yes [provider]  Calcium Carb-Cholecalciferol (CALCIUM 600 + D PO) Take 1 tablet by mouth daily.   Yes [provider]  DULoxetine (CYMBALTA) 30 MG capsule Take 1 capsule (30 mg total) by mouth daily. 11/25/17  Yes Dennie Bible, NP  ERLEADA 60 MG tablet  06/06/18  Yes [provider]  gabapentin (NEURONTIN) 600 MG tablet TAKE 2 TABLET IN THE MORNING 2 AT MIDDAY AND 1 IN THE EVENING 07/11/18  Yes [name], MD  hydrochlorothiazide (HYDRODIURIL) 25 MG tablet Take 1 tablet (25 mg total) by mouth daily. 07/11/18  Yes [name], MD  Leuprolide Acetate (LUPRON IJ) Inject 1 application as directed every 6 (six) months.   Yes [provider]  RAPAFLO 8 MG CAPS capsule  11/09/17  Yes [provider]  simvastatin (ZOCOR) 40  MG tablet Take 1 tablet (40 mg total) by mouth daily. 07/11/18  Yes [name], MD  tamsulosin (FLOMAX) 0.4 MG CAPS capsule Take 0.4 mg daily by mouth. Reported on 05/14/2016 08/17/15  Yes [provider]    ROS:  Out of a complete 14 system review of symptoms, the patient complains only of the following symptoms, and all other reviewed systems are negative.  Walking problems Right knee arthritis  Blood pressure 128/70, pulse 61, height 6\' 3"  (1.905 m), weight 168 lb 8 oz (76.4 kg), SpO2 96 %.  Physical Exam  General: The patient is alert and cooperative at the time of the examination.  Skin: No significant peripheral edema is noted.   Neurologic Exam  Mental status:  The patient is alert and oriented x 3 at the time of the examination. The Mini-Mental status examination done today shows a total score of 20/30.   Cranial nerves: Facial symmetry is present. Speech is normal, no aphasia or dysarthria is noted. Extraocular movements are full. Visual fields are full.  Motor: The patient has good strength in all 4 extremities.  Sensory examination: Soft touch sensation is symmetric on the face, arms, and legs.  Coordination: The patient has good finger-nose-finger and heel-to-shin bilaterally.  Gait and station: The patient has a stooped posture, the patient walks with knees flexed, he walks with a cane.  Gait is slightly wide-based.  Romberg is negative, tandem gait was not attempted.  Reflexes: Deep tendon reflexes are symmetric, but are depressed.   Assessment/Plan:  1.  Gait disturbance  2.  Memory disturbance  3.  Peripheral neuropathy  The patient is on gabapentin for his peripheral neuropathy and he remains on low-dose Cymbalta.  Gabapentin may worsen balance, but the patient has not reported this.  The patient is still operating a motor vehicle, he has had a decline in his cognitive testing with the Mini-Mental status examination.  He does not believe that he has a memory problem, he does not wish to go on any medications for memory.  We will continue the Cymbalta, he will follow-up in 9 months.  Jill Alexanders MD 08/23/2018 2:59 PM  [facility] [address] [place], [place] [address]  Phone [phone] Fax [phone]

## 2018-08-30 ENCOUNTER — Ambulatory Visit (INDEPENDENT_AMBULATORY_CARE_PROVIDER_SITE_OTHER): Payer: Medicare Other | Admitting: Sports Medicine

## 2018-08-30 ENCOUNTER — Encounter: Payer: Self-pay | Admitting: Sports Medicine

## 2018-08-30 DIAGNOSIS — G629 Polyneuropathy, unspecified: Secondary | ICD-10-CM

## 2018-08-30 DIAGNOSIS — M79672 Pain in left foot: Secondary | ICD-10-CM | POA: Diagnosis not present

## 2018-08-30 DIAGNOSIS — M79671 Pain in right foot: Secondary | ICD-10-CM | POA: Diagnosis not present

## 2018-08-30 DIAGNOSIS — B351 Tinea unguium: Secondary | ICD-10-CM

## 2018-08-30 DIAGNOSIS — R2681 Unsteadiness on feet: Secondary | ICD-10-CM

## 2018-08-30 NOTE — Progress Notes (Signed)
Patient ID: Casey Fisher, male   DOB: 1928-08-03, 84 y.o.   MRN: [id number]  Subjective: Casey Fisher is a 84 y.o. male patient seen today in office with complaint of painful thickened and elongated toenails; unable to trim. Patient states that he had another fall. Patient denies any other pedal complaints at this time.    Patient Active Problem List   Diagnosis Date Noted  . Chest pain 11/08/2017  . Memory difficulty 03/24/2016  . Hereditary and idiopathic peripheral neuropathy 03/24/2016  . Abnormality of gait 03/24/2016  . Peripheral neuropathy 04/18/2015  . Hyperlipidemia 04/18/2015  . Prostate cancer ([place]) 08/24/2014  . Right inguinal hernia 10/30/2013    Current Outpatient Medications on File Prior to Visit  Medication Sig Dispense Refill  . aspirin EC 81 MG tablet Take 81 mg by mouth daily. Reported on 05/14/2016    . Calcium Carb-Cholecalciferol (CALCIUM 600 + D PO) Take 1 tablet by mouth daily.    . DULoxetine (CYMBALTA) 30 MG capsule Take 1 capsule (30 mg total) by mouth daily. 30 capsule 11  . ERLEADA 60 MG tablet     . gabapentin (NEURONTIN) 600 MG tablet TAKE 2 TABLET IN THE MORNING 2 AT MIDDAY AND 1 IN THE EVENING 450 tablet 1  . hydrochlorothiazide (HYDRODIURIL) 25 MG tablet Take 1 tablet (25 mg total) by mouth daily. 90 tablet 1  . Leuprolide Acetate (LUPRON IJ) Inject 1 application as directed every 6 (six) months.    Marland Kitchen RAPAFLO 8 MG CAPS capsule   0  . simvastatin (ZOCOR) 40 MG tablet Take 1 tablet (40 mg total) by mouth daily. 90 tablet 1  . tamsulosin (FLOMAX) 0.4 MG CAPS capsule Take 0.4 mg daily by mouth. Reported on 05/14/2016  1   No current facility-administered medications on file prior to visit.     Allergies  Allergen Reactions  . Lyrica [Pregabalin] Other (See Comments)    "constipation"    Objective: Physical Exam  General: Well developed, nourished, no acute distress, awake, alert and oriented x 3, cane assisted gait  Vascular:  Dorsalis pedis artery 2/4 bilateral, Posterior tibial artery 1/4 bilateral, skin temperature warm to warm proximal to distal bilateral lower extremities, + varicosities, Decreased pedal hair present bilateral.  Neurological: Gross sensation present via light touch bilateral. Protective and vibratory sensation decreased bilateral.   Dermatological: Skin is warm, dry, and supple bilateral, Nails 1-10 are tender, long, thick, and discolored with mild subungal debris, no webspace macerations present bilateral, no open lesions present bilateral, no callus/corns/hyperkeratotic tissue present bilateral. No signs of infection bilateral.  Musculoskeletal:  Asymptomatic bunion and hammertoe boney deformities noted bilateral. Muscular strength within normal limits without painon range of motion. No pain with calf compression bilateral.  Assessment and Plan:  Problem List Items Addressed This Visit    None    Visit Diagnoses    Dermatophytosis of nail    -  Primary   Foot pain, bilateral       Neuropathy       Gait instability          -Examined patient.  -Discussed treatment options for painful mycotic nails. -ABN signed -Mechanically debrided and reduced mycotic nails with sterile nail nipper and dremel nail file without incident. -Recommend Moore balance brace and possible physical therapy for gait training and strengthening -Patient to return in 3 months for follow up evaluation or sooner if symptoms worsen.  Landis Martins, DPM

## 2018-08-31 ENCOUNTER — Telehealth: Payer: Self-pay | Admitting: Neurology

## 2018-08-31 ENCOUNTER — Encounter: Payer: Self-pay | Admitting: Family Medicine

## 2018-08-31 ENCOUNTER — Ambulatory Visit (INDEPENDENT_AMBULATORY_CARE_PROVIDER_SITE_OTHER): Payer: Medicare Other | Admitting: Family Medicine

## 2018-08-31 ENCOUNTER — Other Ambulatory Visit: Payer: Self-pay

## 2018-08-31 VITALS — BP 132/64 | HR 61 | Temp 98.5°F | Ht 74.0 in | Wt 170.2 lb

## 2018-08-31 DIAGNOSIS — G6289 Other specified polyneuropathies: Secondary | ICD-10-CM

## 2018-08-31 DIAGNOSIS — S80219A Abrasion, unspecified knee, initial encounter: Secondary | ICD-10-CM | POA: Diagnosis not present

## 2018-08-31 DIAGNOSIS — G629 Polyneuropathy, unspecified: Secondary | ICD-10-CM

## 2018-08-31 DIAGNOSIS — S0512XA Contusion of eyeball and orbital tissues, left eye, initial encounter: Secondary | ICD-10-CM | POA: Diagnosis not present

## 2018-08-31 DIAGNOSIS — Z9181 History of falling: Secondary | ICD-10-CM | POA: Diagnosis not present

## 2018-08-31 MED ORDER — GABAPENTIN 600 MG PO TABS: 600.0000 mg | ORAL_TABLET | Freq: Three times a day (TID) | ORAL | 1 refills | Status: DC

## 2018-08-31 NOTE — Progress Notes (Signed)
Subjective:  By signing my name below, I, Casey Fisher, attest that this documentation has been prepared under the direction and in the presence of Casey Agreste, MD Electronically Signed: Ladene Artist, ED Scribe 08/31/2018 at 8:26 AM.   Patient ID: Casey Fisher, male    DOB: September 09, 1928, 84 y.o.   MRN: [id number]  Chief Complaint  Patient presents with  . positive fall screening    4 and more times  . left eye black due to a spray   HPI  Casey Fisher is a 84 y.o. male who presents to Primary Care at [facility] complaining of issues with his L eye. Pt states that he sprayed deodorant in his L eye last wk; questionable timing of injury. He washed his eye out in the sink and applied eye drops. States he didn't notice any pain or issues with his eye but some noticed that he had a black eye 1.5 wk ago. He denies visual disturbances, eye watering, injury. He does report a fall that occurred 1 wk ago when he hit in between his eyes, however, he notes that his eye was already black prior to the fall.  Pt did have a positive fall screening. H/o peripheral neuropathy and diff with gait. Most recently seen 8/27 by neuro. Denied any falls with walking just fell out of bed while reaching for object on table. Gabapentin 1200 mg bid, 600 at night. Continued low dose cymbalta. Discussion of gabapentin and balance but didn't report balance issues at that OV. - Pt states that he also fell in the yard while taking the trash out 1 wk ago and injured his R knee. Already had a wound to the knee from prev fall. Reports 2 falls since his last OV with neurologist Dr. [name]. States he feels like his balance is off. Pt ambulates with a cane but does have a walker at home.  Patient Active Problem List   Diagnosis Date Noted  . Chest pain 11/08/2017  . Memory difficulty 03/24/2016  . Hereditary and idiopathic peripheral neuropathy 03/24/2016  . Abnormality of gait 03/24/2016  . Peripheral neuropathy  04/18/2015  . Hyperlipidemia 04/18/2015  . Prostate cancer ([place]) 08/24/2014  . Right inguinal hernia 10/30/2013   Past Medical History:  Diagnosis Date  . Abnormality of gait 03/24/2016  . Anxiety   . Cancer (Casey Fisher)   . Cataract   . Depression   . Heart murmur   . Hereditary and idiopathic peripheral neuropathy 03/24/2016  . Hypertension   . Memory difficulty 03/24/2016  . Neuromuscular disorder ([place])   . Neuropathy   . Prostate cancer (Casey Fisher)    remission   Past Surgical History:  Procedure Laterality Date  . CATARACT EXTRACTION Bilateral   . EYE SURGERY    . HERNIA REPAIR  1985  . INGUINAL HERNIA REPAIR Right 12/06/2013   Procedure: HERNIA REPAIR INGUINAL ADULT;  Surgeon: Merrie Roof, MD;  Location: [place];  Service: General;  Laterality: Right;  . INSERTION OF MESH Right 12/06/2013   Procedure: INSERTION OF MESH;  Surgeon: Merrie Roof, MD;  Location: [place];  Service: General;  Laterality: Right;  . PROSTATE SURGERY     Allergies  Allergen Reactions  . Lyrica [Pregabalin] Other (See Comments)    "constipation"   Prior to Admission medications   Medication Sig Start Date End Date Taking? Authorizing Provider  aspirin EC 81 MG tablet Take 81 mg by mouth daily. Reported on 05/14/2016  [provider]  Calcium Carb-Cholecalciferol (CALCIUM 600 + D PO) Take 1 tablet by mouth daily.    [provider]  DULoxetine (CYMBALTA) 30 MG capsule Take 1 capsule (30 mg total) by mouth daily. 11/25/17   Dennie Bible, NP  ERLEADA 60 MG tablet  06/06/18   [provider]  gabapentin (NEURONTIN) 600 MG tablet TAKE 2 TABLET IN THE MORNING 2 AT MIDDAY AND 1 IN THE EVENING 07/11/18   Casey Agreste, MD  hydrochlorothiazide (HYDRODIURIL) 25 MG tablet Take 1 tablet (25 mg total) by mouth daily. 07/11/18   Casey Agreste, MD  Leuprolide Acetate (LUPRON IJ) Inject 1 application as directed every 6 (six) months.    [provider]  RAPAFLO 8 MG  CAPS capsule  11/09/17   [provider]  simvastatin (ZOCOR) 40 MG tablet Take 1 tablet (40 mg total) by mouth daily. 07/11/18   Casey Agreste, MD  tamsulosin (FLOMAX) 0.4 MG CAPS capsule Take 0.4 mg daily by mouth. Reported on 05/14/2016 08/17/15   [provider]   Social History   Socioeconomic History  . Marital status: Widowed    Spouse name: Not on file  . Number of children: Not on file  . Years of education: Not on file  . Highest education level: Not on file  Occupational History  . Not on file  Social Needs  . Financial resource strain: Not on file  . Food insecurity:    Worry: Not on file    Inability: Not on file  . Transportation needs:    Medical: Not on file    Non-medical: Not on file  Tobacco Use  . Smoking status: Former Research scientist (life sciences)  . Smokeless tobacco: Never Used  Substance and Sexual Activity  . Alcohol use: No  . Drug use: No  . Sexual activity: Not on file  Lifestyle  . Physical activity:    Days per week: Not on file    Minutes per session: Not on file  . Stress: Not on file  Relationships  . Social connections:    Talks on phone: Not on file    Gets together: Not on file    Attends religious service: Not on file    Active member of club or organization: Not on file    Attends meetings of clubs or organizations: Not on file    Relationship status: Not on file  . Intimate partner violence:    Fear of current or ex partner: Not on file    Emotionally abused: Not on file    Physically abused: Not on file    Forced sexual activity: Not on file  Other Topics Concern  . Not on file  Social History Narrative   Lives home alone.  Retired.  Widowed.  % children.     Review of Systems  Eyes: Negative for pain, discharge and visual disturbance.       + Bruising to L eye  Skin: Positive for wound.      Objective:   Physical Exam  Constitutional: He is oriented to person, place, and time. He appears well-developed and  well-nourished. No distress.  HENT:  Head: Normocephalic and atraumatic.  Eyes: Pupils are equal, round, and reactive to light. Conjunctivae and EOM are normal.  Bruising with minimal soft tissue swelling over L upper lid. No orbital tenderness. No nasal tenderness. No infraorbital bruising. No scleral injection. Pain free EOM testing. No diplopia.  Neck: Neck supple. No tracheal deviation  present.  Cardiovascular: Normal rate.  Pulmonary/Chest: Effort normal. No respiratory distress.  Musculoskeletal: Normal range of motion.  R LE: tib/fib nontender. Malleoli nontender. Pain free ROM. Small healing abrasion on lower anterior leg ~1 x 1.5 cm. No surrounding erythema or discharge. R Knee: pain free inside knee with extension/flexion. No sig erythema or effusion. 2 small healing abrasions on anterior knee ~2 cm x 1 cm and 2 cm x 2 cm. No bony tenderness. L ankle: small 1 x 1.5 cm abrasion on L anterior knee with smaller abrasion superior 5 x 5 mm. No surrounding erythema. No bony tenderness. Pain free ROM.  Neurological: He is alert and oriented to person, place, and time.  Skin: Skin is warm and dry.  Psychiatric: He has a normal mood and affect. His behavior is normal.  Nursing note and vitals reviewed.  Vitals:   08/31/18 0817  BP: 132/64  Pulse: 61  Temp: 98.5 F (36.9 C)  TempSrc: Oral  SpO2: 96%  Weight: 170 lb 3.2 oz (77.2 kg)  Height: '6\' 2"'  (1.88 m)    Visual Acuity Screening   Right eye Left eye Both eyes  Without correction: 20/50 20/30-1 20/25-1  With correction:         Assessment & Plan:   RUAIRI STUTSMAN is a 84 y.o. male Periorbital contusion of left eye, initial encounter  History of fall  Other polyneuropathy  Abrasion of knee, unspecified laterality, initial encounter  History of peripheral neuropathy, balance issues in the past, followed by neurology.  Had not noted significant falling episodes when last met with neurology, but on further discussion  today it sounds like he has fallen twice recently.    - Area above left eye appears to be a healing ecchymosis, no bony tenderness, no signs of entrapment, no specific eye complaints.  Suspect continued improvement to occur.  Likely had either contusion around the orbit or above the nose with settling of ecchymosis into the upper eyelid.  Continue to watch for improvement, RTC precautions given.   -Few abrasions on knees, without signs of infection.  Symptomatic care discussed with Polysporin over-the-counter as needed and other wound care per handout.  RTC precautions given.   -With recent falls and reported balance issues will decrease gabapentin temporarily to 600 mg 3 times daily, recommended he use the walker in place of the cane if at all possible for more stability for now, and message will be sent to his neurologist to advise of recent symptoms and to determine if other changes/work-up needed.   No orders of the defined types were placed in this encounter.  Patient Instructions   I am concerned about your recent falls.  I would recommend decreasing gabapentin to 1 pill 3 times per day and discuss balance issues and recent falls with Dr. [name]. I will also send him a message. Continue to use walker for assistance - I would prefer walker over cane for now.   The discoloration of your eye appears to be a bruise that is healing.  I do not see any concerns inside your eye today. I do not think any medicines are needed for your eye at this time. The bruising should continue to improve.   Abrasions on your knees appear to be improving.  Okay to use Polysporin antibiotic ointment once or twice per day after cleansing with soap and water and keep covered with bandage.  If any signs of infection with redness or pus/discharge, return for recheck.  Thank you  for coming in today.   Abrasion An abrasion is a cut or scrape on the outer surface of your skin. An abrasion does not extend through all of the  layers of your skin. It is important to care for your abrasion properly to prevent infection. What are the causes? Most abrasions are caused by falling on or gliding across the ground or another surface. When your skin rubs on something, the outer and inner layer of skin rubs off. What are the signs or symptoms? A cut or scrape is the main symptom of this condition. The scrape may be bleeding, or it may appear red or pink. If there was an associated fall, there may be an underlying bruise. How is this diagnosed? An abrasion is diagnosed with a physical exam. How is this treated? Treatment for this condition depends on how large and deep the abrasion is. Usually, your abrasion will be cleaned with water and mild soap. This removes any dirt or debris that may be stuck. An antibiotic ointment may be applied to the abrasion to help prevent infection. A bandage (dressing) may be placed on the abrasion to keep it clean. You may also need a tetanus shot. Follow these instructions at home: Medicines  Take or apply medicines only as directed by your health care provider.  If you were prescribed an antibiotic ointment, finish all of it even if you start to feel better. Wound care  Clean the wound with mild soap and water 2-3 times per day or as directed by your health care provider. Pat your wound dry with a clean towel. Do not rub it.  There are many different ways to close and cover a wound. Follow instructions from your health care provider about: ? Wound care. ? Dressing changes and removal.  Check your wound every day for signs of infection. Watch for: ? Redness, swelling, or pain. ? Fluid, blood, or pus. General instructions   Keep the dressing dry as directed by your health care provider. Do not take baths, swim, use a hot tub, or do anything that would put your wound underwater until your health care provider approves.  If there is swelling, raise (elevate) the injured area above the  level of your heart while you are sitting or lying down.  Keep all follow-up visits as directed by your health care provider. This is important. Contact a health care provider if:  You received a tetanus shot and you have swelling, severe pain, redness, or bleeding at the injection site.  Your pain is not controlled with medicine.  You have increased redness, swelling, or pain at the site of your wound. Get help right away if:  You have a red streak going away from your wound.  You have a fever.  You have fluid, blood, or pus coming from your wound.  You notice a bad smell coming from your wound or your dressing. This information is not intended to replace advice given to you by your health care provider. Make sure you discuss any questions you have with your health care provider. Document Released: 09/23/2005 Document Revised: 08/14/2016 Document Reviewed: 12/12/2014 Elsevier Interactive Patient Education  2017 [place] Prevention in the Home Falls can cause injuries and can affect people from all age groups. There are many simple things that you can do to make your home safe and to help prevent falls. What can I do on the outside of my home?  Regularly repair the edges of walkways and  driveways and fix any cracks.  Remove high doorway thresholds.  Trim any shrubbery on the main path into your home.  Use bright outdoor lighting.  Clear walkways of debris and clutter, including tools and rocks.  Regularly check that handrails are securely fastened and in good repair. Both sides of any steps should have handrails.  Install guardrails along the edges of any raised decks or porches.  Have leaves, snow, and ice cleared regularly.  Use sand or salt on walkways during winter months.  In the garage, clean up any spills right away, including grease or oil spills. What can I do in the bathroom?  Use night lights.  Install grab bars by the toilet and in the tub  and shower. Do not use towel bars as grab bars.  Use non-skid mats or decals on the floor of the tub or shower.  If you need to sit down while you are in the shower, use a plastic, non-slip stool.  Keep the floor dry. Immediately clean up any water that spills on the floor.  Remove soap buildup in the tub or shower on a regular basis.  Attach bath mats securely with double-sided non-slip rug tape.  Remove throw rugs and other tripping hazards from the floor. What can I do in the bedroom?  Use night lights.  Make sure that a bedside light is easy to reach.  Do not use oversized bedding that drapes onto the floor.  Have a firm chair that has side arms to use for getting dressed.  Remove throw rugs and other tripping hazards from the floor. What can I do in the kitchen?  Clean up any spills right away.  Avoid walking on wet floors.  Place frequently used items in easy-to-reach places.  If you need to reach for something above you, use a sturdy step stool that has a grab bar.  Keep electrical cables out of the way.  Do not use floor polish or wax that makes floors slippery. If you have to use wax, make sure that it is non-skid floor wax.  Remove throw rugs and other tripping hazards from the floor. What can I do in the stairways?  Do not leave any items on the stairs.  Make sure that there are handrails on both sides of the stairs. Fix handrails that are broken or loose. Make sure that handrails are as long as the stairways.  Check any carpeting to make sure that it is firmly attached to the stairs. Fix any carpet that is loose or worn.  Avoid having throw rugs at the top or bottom of stairways, or secure the rugs with carpet tape to prevent them from moving.  Make sure that you have a light switch at the top of the stairs and the bottom of the stairs. If you do not have them, have them installed. What are some other fall prevention tips?  Wear closed-toe shoes that  fit well and support your feet. Wear shoes that have rubber soles or low heels.  When you use a stepladder, make sure that it is completely opened and that the sides are firmly locked. Have someone hold the ladder while you are using it. Do not climb a closed stepladder.  Add color or contrast paint or tape to grab bars and handrails in your home. Place contrasting color strips on the first and last steps.  Use mobility aids as needed, such as canes, walkers, scooters, and crutches.  Turn on lights if it is  dark. Replace any light bulbs that burn out.  Set up furniture so that there are clear paths. Keep the furniture in the same spot.  Fix any uneven floor surfaces.  Choose a carpet design that does not hide the edge of steps of a stairway.  Be aware of any and all pets.  Review your medicines with your healthcare provider. Some medicines can cause dizziness or changes in blood pressure, which increase your risk of falling. Talk with your health care provider about other ways that you can decrease your risk of falls. This may include working with a physical therapist or trainer to improve your strength, balance, and endurance. This information is not intended to replace advice given to you by your health care provider. Make sure you discuss any questions you have with your health care provider. Document Released: 12/04/2002 Document Revised: 05/12/2016 Document Reviewed: 01/18/2015 Elsevier Interactive Patient Education  [employer].   If you have lab work done today you will be contacted with your lab results within the next 2 weeks.  If you have not heard from [place] then please contact us. The fastest way to get your results is to register for My Chart.   IF you received an x-ray today, you will receive an invoice from [facility]. Please contact [facility] at [phone] with questions or concerns regarding your invoice.   IF you received labwork today, you  will receive an invoice from [facility]. Please contact [facility] at [phone] with questions or concerns regarding your invoice.   Our billing staff will not be able to assist you with questions regarding bills from these companies.  You will be contacted with the lab results as soon as they are available. The fastest way to get your results is to activate your My Chart account. Instructions are located on the last page of this paperwork. If you have not heard from [place] regarding the results in 2 weeks, please contact this office.       I personally performed the services described in this documentation, which was scribed in my presence. The recorded information has been reviewed and considered for accuracy and completeness, addended by me as needed, and agree with information above.  Signed,   Merri Ray, MD [facility] at [place].  08/31/18 10:41 AM

## 2018-08-31 NOTE — Patient Instructions (Addendum)
I am concerned about your recent falls.  I would recommend decreasing gabapentin to 1 pill 3 times per day and discuss balance issues and recent falls with Dr. [name]. I will also send him a message. Continue to use walker for assistance - I would prefer walker over cane for now.   The discoloration of your eye appears to be a bruise that is healing.  I do not see any concerns inside your eye today. I do not think any medicines are needed for your eye at this time. The bruising should continue to improve.   Abrasions on your knees appear to be improving.  Okay to use Polysporin antibiotic ointment once or twice per day after cleansing with soap and water and keep covered with bandage.  If any signs of infection with redness or pus/discharge, return for recheck.  Thank you for coming in today.   Abrasion An abrasion is a cut or scrape on the outer surface of your skin. An abrasion does not extend through all of the layers of your skin. It is important to care for your abrasion properly to prevent infection. What are the causes? Most abrasions are caused by falling on or gliding across the ground or another surface. When your skin rubs on something, the outer and inner layer of skin rubs off. What are the signs or symptoms? A cut or scrape is the main symptom of this condition. The scrape may be bleeding, or it may appear red or pink. If there was an associated fall, there may be an underlying bruise. How is this diagnosed? An abrasion is diagnosed with a physical exam. How is this treated? Treatment for this condition depends on how large and deep the abrasion is. Usually, your abrasion will be cleaned with water and mild soap. This removes any dirt or debris that may be stuck. An antibiotic ointment may be applied to the abrasion to help prevent infection. A bandage (dressing) may be placed on the abrasion to keep it clean. You may also need a tetanus shot. Follow these instructions at  home: Medicines  Take or apply medicines only as directed by your health care provider.  If you were prescribed an antibiotic ointment, finish all of it even if you start to feel better. Wound care  Clean the wound with mild soap and water 2-3 times per day or as directed by your health care provider. Pat your wound dry with a clean towel. Do not rub it.  There are many different ways to close and cover a wound. Follow instructions from your health care provider about: ? Wound care. ? Dressing changes and removal.  Check your wound every day for signs of infection. Watch for: ? Redness, swelling, or pain. ? Fluid, blood, or pus. General instructions   Keep the dressing dry as directed by your health care provider. Do not take baths, swim, use a hot tub, or do anything that would put your wound underwater until your health care provider approves.  If there is swelling, raise (elevate) the injured area above the level of your heart while you are sitting or lying down.  Keep all follow-up visits as directed by your health care provider. This is important. Contact a health care provider if:  You received a tetanus shot and you have swelling, severe pain, redness, or bleeding at the injection site.  Your pain is not controlled with medicine.  You have increased redness, swelling, or pain at the site of your wound. Get  help right away if:  You have a red streak going away from your wound.  You have a fever.  You have fluid, blood, or pus coming from your wound.  You notice a bad smell coming from your wound or your dressing. This information is not intended to replace advice given to you by your health care provider. Make sure you discuss any questions you have with your health care provider. Document Released: 09/23/2005 Document Revised: 08/14/2016 Document Reviewed: 12/12/2014 Elsevier Interactive Patient Education  2017 [place] Prevention in the Home Falls  can cause injuries and can affect people from all age groups. There are many simple things that you can do to make your home safe and to help prevent falls. What can I do on the outside of my home?  Regularly repair the edges of walkways and driveways and fix any cracks.  Remove high doorway thresholds.  Trim any shrubbery on the main path into your home.  Use bright outdoor lighting.  Clear walkways of debris and clutter, including tools and rocks.  Regularly check that handrails are securely fastened and in good repair. Both sides of any steps should have handrails.  Install guardrails along the edges of any raised decks or porches.  Have leaves, snow, and ice cleared regularly.  Use sand or salt on walkways during winter months.  In the garage, clean up any spills right away, including grease or oil spills. What can I do in the bathroom?  Use night lights.  Install grab bars by the toilet and in the tub and shower. Do not use towel bars as grab bars.  Use non-skid mats or decals on the floor of the tub or shower.  If you need to sit down while you are in the shower, use a plastic, non-slip stool.  Keep the floor dry. Immediately clean up any water that spills on the floor.  Remove soap buildup in the tub or shower on a regular basis.  Attach bath mats securely with double-sided non-slip rug tape.  Remove throw rugs and other tripping hazards from the floor. What can I do in the bedroom?  Use night lights.  Make sure that a bedside light is easy to reach.  Do not use oversized bedding that drapes onto the floor.  Have a firm chair that has side arms to use for getting dressed.  Remove throw rugs and other tripping hazards from the floor. What can I do in the kitchen?  Clean up any spills right away.  Avoid walking on wet floors.  Place frequently used items in easy-to-reach places.  If you need to reach for something above you, use a sturdy step stool that  has a grab bar.  Keep electrical cables out of the way.  Do not use floor polish or wax that makes floors slippery. If you have to use wax, make sure that it is non-skid floor wax.  Remove throw rugs and other tripping hazards from the floor. What can I do in the stairways?  Do not leave any items on the stairs.  Make sure that there are handrails on both sides of the stairs. Fix handrails that are broken or loose. Make sure that handrails are as long as the stairways.  Check any carpeting to make sure that it is firmly attached to the stairs. Fix any carpet that is loose or worn.  Avoid having throw rugs at the top or bottom of stairways, or secure the rugs with  carpet tape to prevent them from moving.  Make sure that you have a light switch at the top of the stairs and the bottom of the stairs. If you do not have them, have them installed. What are some other fall prevention tips?  Wear closed-toe shoes that fit well and support your feet. Wear shoes that have rubber soles or low heels.  When you use a stepladder, make sure that it is completely opened and that the sides are firmly locked. Have someone hold the ladder while you are using it. Do not climb a closed stepladder.  Add color or contrast paint or tape to grab bars and handrails in your home. Place contrasting color strips on the first and last steps.  Use mobility aids as needed, such as canes, walkers, scooters, and crutches.  Turn on lights if it is dark. Replace any light bulbs that burn out.  Set up furniture so that there are clear paths. Keep the furniture in the same spot.  Fix any uneven floor surfaces.  Choose a carpet design that does not hide the edge of steps of a stairway.  Be aware of any and all pets.  Review your medicines with your healthcare provider. Some medicines can cause dizziness or changes in blood pressure, which increase your risk of falling. Talk with your health care provider about other  ways that you can decrease your risk of falls. This may include working with a physical therapist or trainer to improve your strength, balance, and endurance. This information is not intended to replace advice given to you by your health care provider. Make sure you discuss any questions you have with your health care provider. Document Released: 12/04/2002 Document Revised: 05/12/2016 Document Reviewed: 01/18/2015 Elsevier Interactive Patient Education  [employer].   If you have lab work done today you will be contacted with your lab results within the next 2 weeks.  If you have not heard from [place] then please contact us. The fastest way to get your results is to register for My Chart.   IF you received an x-ray today, you will receive an invoice from [facility]. Please contact [facility] at [phone] with questions or concerns regarding your invoice.   IF you received labwork today, you will receive an invoice from [place]. Please contact [facility] at [phone] with questions or concerns regarding your invoice.   Our billing staff will not be able to assist you with questions regarding bills from these companies.  You will be contacted with the lab results as soon as they are available. The fastest way to get your results is to activate your My Chart account. Instructions are located on the last page of this paperwork. If you have not heard from [place] regarding the results in 2 weeks, please contact this office.

## 2018-08-31 NOTE — Telephone Encounter (Signed)
I called the patient. He has had a recent fall. He has been cut back on the gabapentin to 600 mg tid, we could go up on the Cymbalta, but he does not want to do this now. He was not using a walker when he fell.  The patient will try to use a walker as much as possible to prevent falls.  Apparently he fell in the yard the day I saw him in office, this was before the gabapentin dose was even increased.

## 2018-09-06 ENCOUNTER — Encounter: Payer: Self-pay | Admitting: Physician Assistant

## 2018-09-06 ENCOUNTER — Ambulatory Visit (INDEPENDENT_AMBULATORY_CARE_PROVIDER_SITE_OTHER): Payer: Medicare Other

## 2018-09-06 ENCOUNTER — Ambulatory Visit (INDEPENDENT_AMBULATORY_CARE_PROVIDER_SITE_OTHER): Payer: Medicare Other | Admitting: Physician Assistant

## 2018-09-06 ENCOUNTER — Other Ambulatory Visit: Payer: Self-pay

## 2018-09-06 VITALS — BP 126/67 | HR 69 | Temp 99.5°F | Resp 18 | Ht 70.59 in | Wt 163.6 lb

## 2018-09-06 DIAGNOSIS — M25512 Pain in left shoulder: Secondary | ICD-10-CM | POA: Diagnosis not present

## 2018-09-06 DIAGNOSIS — W19XXXD Unspecified fall, subsequent encounter: Secondary | ICD-10-CM | POA: Diagnosis not present

## 2018-09-06 DIAGNOSIS — R0789 Other chest pain: Secondary | ICD-10-CM | POA: Diagnosis not present

## 2018-09-06 DIAGNOSIS — S299XXA Unspecified injury of thorax, initial encounter: Secondary | ICD-10-CM | POA: Diagnosis not present

## 2018-09-06 DIAGNOSIS — S42295A Other nondisplaced fracture of upper end of left humerus, initial encounter for closed fracture: Secondary | ICD-10-CM | POA: Diagnosis not present

## 2018-09-06 DIAGNOSIS — W19XXXA Unspecified fall, initial encounter: Secondary | ICD-10-CM

## 2018-09-06 DIAGNOSIS — S43015A Anterior dislocation of left humerus, initial encounter: Secondary | ICD-10-CM | POA: Diagnosis not present

## 2018-09-06 NOTE — Progress Notes (Signed)
Casey Fisher  MRN: [id number] DOB: 11-15-1928  Subjective:  Casey Fisher is a 84 y.o. male seen in office today for a chief complaint of fall on 08/22/18. He was going down the hall in his house and fell against the door. Hit his left elbow, left shoulder, and left hips. Thinks he may have also bumped his head, but notes that did not hurt.Marland Kitchen He then crawled to the bed and called his friend. Saw his nuerologist after this on 08/23/18 and told him about the event as he thought it may be due to medication. Was evaluated by PCP in office on 08/31/18 for left eye issues, tx for periorbital contusion of left eye and knee  abrasions.  Gabapentin dose was decreased to 600 mg 3 times daily.  Today, he would like his shoulder and left-sided ribs to be evaluated.  He is having bilateral shoulder pain but worse in the left.  He typically ambulates with use of cane, uses his left hand.  Pain in his shoulder is worsened with use of cane.  It is relieved with rest.  Pain is worse when pressure is applied.  Denies LOC, syncope, headache, blurred vision, vomiting, nausea, confusion, chest pain, pleuritic pain SOB, DOE, numbness, tingling, back pain, hip pain, and knee pain.Taking medications as prescribed. He is not taking any anticoagulation. No PMH of fractures.  Lives at home by himself.   Review of Systems  Constitutional: Negative for chills, diaphoresis and fever.  Cardiovascular: Negative for palpitations.  Gastrointestinal: Negative for abdominal pain, nausea and vomiting.  Neurological: Negative for dizziness and light-headedness.    Patient Active Problem List   Diagnosis Date Noted  . Chest pain 11/08/2017  . Memory difficulty 03/24/2016  . Hereditary and idiopathic peripheral neuropathy 03/24/2016  . Abnormality of gait 03/24/2016  . Peripheral neuropathy 04/18/2015  . Hyperlipidemia 04/18/2015  . Prostate cancer ([place]) 08/24/2014  . Right inguinal hernia 10/30/2013    Current Outpatient  Medications on File Prior to Visit  Medication Sig Dispense Refill  . aspirin EC 81 MG tablet Take 81 mg by mouth daily. Reported on 05/14/2016    . Calcium Carb-Cholecalciferol (CALCIUM 600 + D PO) Take 1 tablet by mouth daily.    . DULoxetine (CYMBALTA) 30 MG capsule Take 1 capsule (30 mg total) by mouth daily. 30 capsule 11  . ERLEADA 60 MG tablet     . gabapentin (NEURONTIN) 600 MG tablet Take 1 tablet (600 mg total) by mouth 3 (three) times daily. 450 tablet 1  . hydrochlorothiazide (HYDRODIURIL) 25 MG tablet Take 1 tablet (25 mg total) by mouth daily. 90 tablet 1  . Leuprolide Acetate (LUPRON IJ) Inject 1 application as directed every 6 (six) months.    Marland Kitchen RAPAFLO 8 MG CAPS capsule   0  . simvastatin (ZOCOR) 40 MG tablet Take 1 tablet (40 mg total) by mouth daily. 90 tablet 1  . tamsulosin (FLOMAX) 0.4 MG CAPS capsule Take 0.4 mg daily by mouth. Reported on 05/14/2016  1   No current facility-administered medications on file prior to visit.     Allergies  Allergen Reactions  . Lyrica [Pregabalin] Other (See Comments)    "constipation"      Social History   Socioeconomic History  . Marital status: Widowed    Spouse name: Not on file  . Number of children: Not on file  . Years of education: Not on file  . Highest education level: Not on file  Occupational History  .  Not on file  Social Needs  . Financial resource strain: Not on file  . Food insecurity:    Worry: Not on file    Inability: Not on file  . Transportation needs:    Medical: Not on file    Non-medical: Not on file  Tobacco Use  . Smoking status: Former Research scientist (life sciences)  . Smokeless tobacco: Never Used  Substance and Sexual Activity  . Alcohol use: No  . Drug use: No  . Sexual activity: Not on file  Lifestyle  . Physical activity:    Days per week: Not on file    Minutes per session: Not on file  . Stress: Not on file  Relationships  . Social connections:    Talks on phone: Not on file    Gets together: Not on  file    Attends religious service: Not on file    Active member of club or organization: Not on file    Attends meetings of clubs or organizations: Not on file    Relationship status: Not on file  . Intimate partner violence:    Fear of current or ex partner: Not on file    Emotionally abused: Not on file    Physically abused: Not on file    Forced sexual activity: Not on file  Other Topics Concern  . Not on file  Social History Narrative   Lives home alone.  Retired.  Widowed.  % children.      Objective:  BP 126/67   Pulse 69   Temp 99.5 F (37.5 C) (Oral)   Resp 18   Ht 5' 10.59" (1.793 m)   Wt 163 lb 9.6 oz (74.2 kg)   SpO2 97%   BMI 23.08 kg/m   Physical Exam  Constitutional: He is oriented to person, place, and time. He appears well-developed and well-nourished. No distress.  HENT:  Head: Normocephalic and atraumatic.  Right Ear: Tympanic membrane, external ear and ear canal normal. No hemotympanum.  Left Ear: Tympanic membrane, external ear and ear canal normal. No hemotympanum.  Eyes: Pupils are equal, round, and reactive to light. Conjunctivae and EOM are normal.  Bruising with minimal soft tissue swelling over L upper lid. No orbital tenderness. No nasal tenderness. No infraorbital bruising. No scleral injection. Pain free EOM testing. No diplopia.   Neck: Normal range of motion.  Cardiovascular:  Pulses:      Radial pulses are 2+ on the right side, and 2+ on the left side.  Pulmonary/Chest: Effort normal and breath sounds normal. He has no decreased breath sounds. He has no wheezes. He has no rhonchi. He has no rales. Chest wall is not dull to percussion. He exhibits tenderness. He exhibits no mass, no laceration, no crepitus, no swelling and no retraction.    Musculoskeletal:       Right shoulder: Normal. He exhibits normal range of motion, no tenderness, no bony tenderness and normal strength.       Left shoulder: He exhibits tenderness and bony tenderness  (diffuse with palpation of anterior and lateral shoulder). He exhibits normal range of motion, no swelling, no effusion, no spasm, normal pulse and normal strength.       Right elbow: Normal.He exhibits normal range of motion and no swelling. No tenderness found.       Left elbow: He exhibits normal range of motion and no swelling. No tenderness found.       Right hip: Normal.       Left hip:  Normal.       Right knee: He exhibits normal range of motion and no swelling. No tenderness found.       Left knee: He exhibits normal range of motion and no swelling. No tenderness found.       Cervical back: Normal.       Lumbar back: Normal.  Neurological: He is alert and oriented to person, place, and time. He has normal strength. No cranial nerve deficit or sensory deficit.  Reflex Scores:      Tricep reflexes are 2+ on the right side and 2+ on the left side.      Bicep reflexes are 2+ on the right side and 2+ on the left side.      Brachioradialis reflexes are 2+ on the right side and 2+ on the left side.      Patellar reflexes are 2+ on the right side and 2+ on the left side.      Achilles reflexes are 2+ on the right side and 2+ on the left side. Uses cane as walking assistance device.    Skin: Skin is warm and dry.  Few healing scabs noted on anterior knees b/l. No surrounding erythema or discharge.  Psychiatric: He has a normal mood and affect.  Vitals reviewed.    Dg Ribs Unilateral W/chest Left  Result Date: 09/06/2018 CLINICAL DATA:  Fall. EXAM: LEFT RIBS AND CHEST - 3+ VIEW COMPARISON:  Chest x-ray dated November 08, 2017. FINDINGS: The patient is rotated to the left in the costophrenic angles are excluded from the field of view on the frontal chest x-ray. The heart size and mediastinal contours are within normal limits. Unchanged trace left pleural effusion with minimal left basilar atelectasis/scarring. No pneumothorax. No fracture or other bone lesions are seen involving the ribs.  IMPRESSION: 1. No rib fracture. 2. Unchanged trace left pleural effusion.  No pneumothorax. Electronically Signed   By: Titus Dubin M.D.   On: 09/06/2018 12:58   Dg Shoulder Left  Result Date: 09/06/2018 CLINICAL DATA:  Pain with palpation of the shoulder status post fall 1.5 weeks ago EXAM: LEFT SHOULDER - 2+ VIEW COMPARISON:  None. FINDINGS: Acute nondisplaced fracture of the superolateral humeral head. Mild osteoarthritis of the left glenohumeral joint. Normal acromioclavicular joint. No aggressive osseous lesion. No soft tissue abnormality. IMPRESSION: Acute nondisplaced fracture of the superolateral humeral head. Electronically Signed   By: Kathreen Devoid   On: 09/06/2018 12:48     Assessment and Plan :  1. Other closed nondisplaced fracture of proximal end of left humerus, initial encounter Plain film of left shoulder with acute nondisplaced fracture of the superior lateral humeral head.  He is NVI. S it Ling applied.  Patient does typically use his left hand when using his cane but notes he feels comfortable using his right hand.  Recommended rest, continuous use of shoulder sling, ice, and extra strength Tylenol as needed for pain.  Referral to orthopedic placed.  Follow-up with them as planned.  Given strict return precautions. - Apply other splint - Ambulatory referral to Orthopedic Surgery  2. Fall, subsequent encounter No new falls reported since 08/22/18. Plain films of unilateral ribs and chest with no acute findings.  Recommend follow-up with PCP to discuss further fall prevention management.  - DG Shoulder Left; Future - DG Ribs Unilateral W/Chest Left; Future    Tenna Delaine PA-C  [facility] 09/06/2018 1:02 PM

## 2018-09-06 NOTE — Patient Instructions (Addendum)
You have a fracture in the shoulder. I recommend you rest, keep shoulder in a sling, ice when you are home, and use extra strength tylenol for pain. You may want to sleep in a reclining chair or propped up with pillows or a wedge in bed to allow you to wear the sling. I have placed a referral to orthopedic surgery. They should call you within the next 1-2 weeks. Please keep shoulder immobilized until then. You need to schedule an appointment with Dr. Carlota Raspberry after you see orthopedics to discuss further fall prevention.    If you have lab work done today you will be contacted with your lab results within the next 2 weeks.  If you have not heard from [place] then please contact us. The fastest way to get your results is to register for My Chart.   Fall Prevention in the Home Falls can cause injuries. They can happen to people of all ages. There are many things you can do to make your home safe and to help prevent falls. What can I do on the outside of my home?  Regularly fix the edges of walkways and driveways and fix any cracks.  Remove anything that might make you trip as you walk through a door, such as a raised step or threshold.  Trim any bushes or trees on the path to your home.  Use bright outdoor lighting.  Clear any walking paths of anything that might make someone trip, such as rocks or tools.  Regularly check to see if handrails are loose or broken. Make sure that both sides of any steps have handrails.  Any raised decks and porches should have guardrails on the edges.  Have any leaves, snow, or ice cleared regularly.  Use sand or salt on walking paths during winter.  Clean up any spills in your garage right away. This includes oil or grease spills. What can I do in the bathroom?  Use night lights.  Install grab bars by the toilet and in the tub and shower. Do not use towel bars as grab bars.  Use non-skid mats or decals in the tub or shower.  If you need to sit down in the  shower, use a plastic, non-slip stool.  Keep the floor dry. Clean up any water that spills on the floor as soon as it happens.  Remove soap buildup in the tub or shower regularly.  Attach bath mats securely with double-sided non-slip rug tape.  Do not have throw rugs and other things on the floor that can make you trip. What can I do in the bedroom?  Use night lights.  Make sure that you have a light by your bed that is easy to reach.  Do not use any sheets or blankets that are too big for your bed. They should not hang down onto the floor.  Have a firm chair that has side arms. You can use this for support while you get dressed.  Do not have throw rugs and other things on the floor that can make you trip. What can I do in the kitchen?  Clean up any spills right away.  Avoid walking on wet floors.  Keep items that you use a lot in easy-to-reach places.  If you need to reach something above you, use a strong step stool that has a grab bar.  Keep electrical cords out of the way.  Do not use floor polish or wax that makes floors slippery. If you must use  wax, use non-skid floor wax.  Do not have throw rugs and other things on the floor that can make you trip. What can I do with my stairs?  Do not leave any items on the stairs.  Make sure that there are handrails on both sides of the stairs and use them. Fix handrails that are broken or loose. Make sure that handrails are as long as the stairways.  Check any carpeting to make sure that it is firmly attached to the stairs. Fix any carpet that is loose or worn.  Avoid having throw rugs at the top or bottom of the stairs. If you do have throw rugs, attach them to the floor with carpet tape.  Make sure that you have a light switch at the top of the stairs and the bottom of the stairs. If you do not have them, ask someone to add them for you. What else can I do to help prevent falls?  Wear shoes that: ? Do not have high  heels. ? Have rubber bottoms. ? Are comfortable and fit you well. ? Are closed at the toe. Do not wear sandals.  If you use a stepladder: ? Make sure that it is fully opened. Do not climb a closed stepladder. ? Make sure that both sides of the stepladder are locked into place. ? Ask someone to hold it for you, if possible.  Clearly mark and make sure that you can see: ? Any grab bars or handrails. ? First and last steps. ? Where the edge of each step is.  Use tools that help you move around (mobility aids) if they are needed. These include: ? Canes. ? Walkers. ? Scooters. ? Crutches.  Turn on the lights when you go into a dark area. Replace any light bulbs as soon as they burn out.  Set up your furniture so you have a clear path. Avoid moving your furniture around.  If any of your floors are uneven, fix them.  If there are any pets around you, be aware of where they are.  Review your medicines with your doctor. Some medicines can make you feel dizzy. This can increase your chance of falling. Ask your doctor what other things that you can do to help prevent falls. This information is not intended to replace advice given to you by your health care provider. Make sure you discuss any questions you have with your health care provider. Document Released: 10/10/2009 Document Revised: 05/21/2016 Document Reviewed: 01/18/2015 Elsevier Interactive Patient Education  2018 [employer].    How to Use a Sling A sling is a type of hanging bandage. You wear it around your neck to protect an injured arm, shoulder, or other body part. You may need to wear a sling to keep you from moving the injured body part while it heals. Keeping the injured part of your body still reduces pain and speeds up healing. Your doctor may suggest you use a sling if you have:  A broken arm.  A broken collarbone.  A shoulder injury.  Surgery.  What are the risks? Wearing a sling the wrong way can:  Make  your injury worse.  Cause stiffness or numbness.  Affect blood circulation in your arm and hand. This can cause tingling or numbness in your fingers or hands.  How to use a sling The way that you should use a sling depends on your injury. It is important that you follow all of your doctor's instructions for your injury.  Also follow these general suggestions:  Wear the sling so that your arm bends 90 degrees at the elbow. That is like a right angle or the shape of a capital letter "L." The sling should also support your wrist and hand.  Try not to move your arm.  Do not lie down flat on your back while you have to wear a sling. Sleep in a recliner or use pillows to raise your upper body in bed.  Do not twist, raise, or move your arm in a way that could make your injury worse.  Do not lean on your arm while you have to wear a sling.  Do not lift anything while you have to wear a sling.  Get help if:  You have bruising, swelling, or pain that is getting worse.  Your pain medicine is not helping.  You have a fever. Get help right away if:  Your fingers are numb or tingling.  Your fingers turn blue or feel cold to the touch.  You cannot control the bleeding from your injury.  You are short of breath. This information is not intended to replace advice given to you by your health care provider. Make sure you discuss any questions you have with your health care provider. Document Released: 03/10/2010 Document Revised: 05/21/2016 Document Reviewed: 10/17/2014 Elsevier Interactive Patient Education  2018 [employer].   IF you received an x-ray today, you will receive an invoice from [facility]. Please contact [facility] at [phone] with questions or concerns regarding your invoice.   IF you received labwork today, you will receive an invoice from [facility]. Please contact [facility] at [phone] with questions or concerns regarding your invoice.   Our  billing staff will not be able to assist you with questions regarding bills from these companies.  You will be contacted with the lab results as soon as they are available. The fastest way to get your results is to activate your My Chart account. Instructions are located on the last page of this paperwork. If you have not heard from [place] regarding the results in 2 weeks, please contact this office.

## 2018-09-08 ENCOUNTER — Ambulatory Visit (INDEPENDENT_AMBULATORY_CARE_PROVIDER_SITE_OTHER): Payer: PRIVATE HEALTH INSURANCE | Admitting: Orthopaedic Surgery

## 2018-09-08 ENCOUNTER — Encounter (INDEPENDENT_AMBULATORY_CARE_PROVIDER_SITE_OTHER): Payer: Self-pay | Admitting: Orthopaedic Surgery

## 2018-09-08 VITALS — BP 115/55 | HR 76 | Ht 70.0 in | Wt 163.0 lb

## 2018-09-08 DIAGNOSIS — S42292A Other displaced fracture of upper end of left humerus, initial encounter for closed fracture: Secondary | ICD-10-CM

## 2018-09-08 DIAGNOSIS — W19XXXA Unspecified fall, initial encounter: Secondary | ICD-10-CM

## 2018-09-08 DIAGNOSIS — M25512 Pain in left shoulder: Secondary | ICD-10-CM

## 2018-09-08 NOTE — Progress Notes (Signed)
Office Visit Note   Patient: Casey Fisher           Date of Birth: 29-Jul-1928           MRN: [id number] Visit Date: 09/08/2018              Requested by: Casey Douglas, PA-C [place], [place] [address] PCP: Casey Agreste, MD   Assessment & Plan: Visit Diagnoses:  1. Closed fracture of head of left humerus, initial encounter     Plan: #1: Continue with sling only for comfort.  He is not very painful today with his exam and therefore this may be an artifact but we can continue to try to treat him conservatively.  Follow-Up Instructions: Return in about 4 weeks (around 10/06/2018).   Orders:  No orders of the defined types were placed in this encounter.  No orders of the defined types were placed in this encounter.     Procedures: No procedures performed   Clinical Data: No additional findings.   Subjective: Chief Complaint  Patient presents with  . Left Shoulder - Pain  . New Patient (Initial Visit)    08/25/18 PT FELL WALKING DOWN HALL FELL ON SHOULDER AND HIT THE DOOR. SEEN PCP AND REFERRED HERE    HPI  Casey Fisher is a 84 year old African-American male who is seen today at the request of Casey Delaine, PA-C for evaluation of his left shoulder.  History is that he had a fall on August 22, 2018 when he was going down the hallway in his house when he fell against the door.  He struck his elbow and also the left shoulder.  An x-ray was performed and was read as an acute fracture of the lateral head of the left shoulder.  He was then referred to [place] for evaluation.  He was given a sling.  He states he is quite comfortable.  Denies any numbness or tingling.   Review of Systems  Constitutional: Negative for fatigue and fever.  HENT: Negative for ear pain.   Eyes: Negative for pain.  Respiratory: Negative for cough and shortness of breath.   Cardiovascular: Positive for leg swelling.  Gastrointestinal: Negative for constipation and  diarrhea.  Genitourinary: Negative for difficulty urinating.  Musculoskeletal: Positive for back pain and neck pain.  Skin: Negative for rash.  Allergic/Immunologic: Negative for food allergies.  Neurological: Positive for weakness.  Hematological: Does not bruise/bleed easily.  Psychiatric/Behavioral: Negative for sleep disturbance.     Objective: Vital Signs: BP (!) 115/55 (BP Location: Right Arm, Patient Position: Sitting, Cuff Size: Normal)   Pulse 76   Ht 5\' 10"  (1.778 m)   Wt 163 lb (73.9 kg)   BMI 23.39 kg/m   Physical Exam  Constitutional: He is oriented to person, place, and time. He appears well-developed.  HENT:  Mouth/Throat: Oropharynx is clear and moist.  Eyes: Pupils are equal, round, and reactive to light. EOM are normal.  Pulmonary/Chest: Effort normal.  Neurological: He is alert and oriented to person, place, and time.  Skin: Skin is warm and dry.  Psychiatric: He has a normal mood and affect. His behavior is normal.    Ortho Exam  Today he sits comfortably in the examination chair.  He has 130 degrees of forward flexion he has 90 degrees of abduction.  With the arm at 90 degrees abduction he has run 45 degrees of external rotation he is very limited in internal rotation at that position.  He  does have good strength at this time but I would only use a small amount of pressure for the exam.  Little bit of tenderness diffusely about the shoulder itself.  No AC tenderness.  Specialty Comments:  No specialty comments available.  Imaging: No results found.  Dg Ribs Unilateral W/chest Left  Result Date: 09/06/2018 CLINICAL DATA:  Fall. EXAM: LEFT RIBS AND CHEST - 3+ VIEW COMPARISON:  Chest x-ray dated November 08, 2017. FINDINGS: The patient is rotated to the left in the costophrenic angles are excluded from the field of view on the frontal chest x-ray. The heart size and mediastinal contours are within normal limits. Unchanged trace left pleural effusion with  minimal left basilar atelectasis/scarring. No pneumothorax. No fracture or other bone lesions are seen involving the ribs. IMPRESSION: 1. No rib fracture. 2. Unchanged trace left pleural effusion.  No pneumothorax. Electronically Signed   By: Titus Dubin M.D.   On: 09/06/2018 12:58   Dg Shoulder Left  Result Date: 09/06/2018 CLINICAL DATA:  Pain with palpation of the shoulder status post fall 1.5 weeks ago EXAM: LEFT SHOULDER - 2+ VIEW COMPARISON:  None. FINDINGS: Acute nondisplaced fracture of the superolateral humeral head. Mild osteoarthritis of the left glenohumeral joint. Normal acromioclavicular joint. No aggressive osseous lesion. No soft tissue abnormality. IMPRESSION: Acute nondisplaced fracture of the superolateral humeral head. Electronically Signed   By: Kathreen Devoid   On: 09/06/2018 12:48  I have also reviewed the films.  He does have degenerative joint space narrowing inferiorly.   PMFS History: Current Outpatient Medications  Medication Sig Dispense Refill  . aspirin EC 81 MG tablet Take 81 mg by mouth daily. Reported on 05/14/2016    . Calcium Carb-Cholecalciferol (CALCIUM 600 + D PO) Take 1 tablet by mouth daily.    . DULoxetine (CYMBALTA) 30 MG capsule Take 1 capsule (30 mg total) by mouth daily. 30 capsule 11  . ERLEADA 60 MG tablet     . gabapentin (NEURONTIN) 600 MG tablet Take 1 tablet (600 mg total) by mouth 3 (three) times daily. 450 tablet 1  . hydrochlorothiazide (HYDRODIURIL) 25 MG tablet Take 1 tablet (25 mg total) by mouth daily. 90 tablet 1  . Leuprolide Acetate (LUPRON IJ) Inject 1 application as directed every 6 (six) months.    Marland Kitchen RAPAFLO 8 MG CAPS capsule   0  . simvastatin (ZOCOR) 40 MG tablet Take 1 tablet (40 mg total) by mouth daily. 90 tablet 1  . tamsulosin (FLOMAX) 0.4 MG CAPS capsule Take 0.4 mg daily by mouth. Reported on 05/14/2016  1   No current facility-administered medications for this visit.     Patient Active Problem List   Diagnosis Date  Noted  . Chest pain 11/08/2017  . Memory difficulty 03/24/2016  . Hereditary and idiopathic peripheral neuropathy 03/24/2016  . Abnormality of gait 03/24/2016  . Peripheral neuropathy 04/18/2015  . Hyperlipidemia 04/18/2015  . Prostate cancer ([place]) 08/24/2014  . Right inguinal hernia 10/30/2013   Past Medical History:  Diagnosis Date  . Abnormality of gait 03/24/2016  . Anxiety   . Cancer ([facility])   . Cataract   . Depression   . Heart murmur   . Hereditary and idiopathic peripheral neuropathy 03/24/2016  . Hypertension   . Memory difficulty 03/24/2016  . Neuromuscular disorder ([facility])   . Neuropathy   . Prostate cancer ([facility])    remission    Family History  Problem Relation Age of Onset  . Brain cancer Daughter   .  Heart disease Mother   . Stroke Mother   . Heart disease Father     Past Surgical History:  Procedure Laterality Date  . CATARACT EXTRACTION Bilateral   . EYE SURGERY    . HERNIA REPAIR  1985  . INGUINAL HERNIA REPAIR Right 12/06/2013   Procedure: HERNIA REPAIR INGUINAL ADULT;  Surgeon: Merrie Roof, MD;  Location: [facility];  Service: General;  Laterality: Right;  . INSERTION OF MESH Right 12/06/2013   Procedure: INSERTION OF MESH;  Surgeon: Merrie Roof, MD;  Location: [facility];  Service: General;  Laterality: Right;  . PROSTATE SURGERY     Social History   Occupational History  . Not on file  Tobacco Use  . Smoking status: Former Research scientist (life sciences)  . Smokeless tobacco: Never Used  Substance and Sexual Activity  . Alcohol use: No  . Drug use: No  . Sexual activity: Not on file

## 2018-09-12 ENCOUNTER — Encounter: Payer: Self-pay | Admitting: Physician Assistant

## 2018-09-20 ENCOUNTER — Other Ambulatory Visit: Payer: Medicare Other | Admitting: Orthotics

## 2018-09-21 ENCOUNTER — Ambulatory Visit: Payer: Medicare Other | Admitting: Orthotics

## 2018-09-21 DIAGNOSIS — M79671 Pain in right foot: Secondary | ICD-10-CM

## 2018-09-21 DIAGNOSIS — R269 Unspecified abnormalities of gait and mobility: Secondary | ICD-10-CM

## 2018-09-21 DIAGNOSIS — M79672 Pain in left foot: Principal | ICD-10-CM

## 2018-09-21 DIAGNOSIS — R2681 Unsteadiness on feet: Secondary | ICD-10-CM

## 2018-09-21 DIAGNOSIS — R413 Other amnesia: Secondary | ICD-10-CM

## 2018-09-21 DIAGNOSIS — G629 Polyneuropathy, unspecified: Secondary | ICD-10-CM

## 2018-09-21 NOTE — Progress Notes (Signed)
Patient came in today for Eval/assessment for [facility] Balance Brace.  Patient presents with a history of falling, and also fearful of falling.  Balance tests performed and patient does have issues keeping balance especially in foot to heel test.  Patient has week dorsiflexors and well as inverters/everters muscle group.  Demonstrants ankle instability.   

## 2018-10-04 DIAGNOSIS — R310 Gross hematuria: Secondary | ICD-10-CM | POA: Diagnosis not present

## 2018-10-04 DIAGNOSIS — N35012 Post-traumatic membranous urethral stricture: Secondary | ICD-10-CM | POA: Diagnosis not present

## 2018-10-04 DIAGNOSIS — C61 Malignant neoplasm of prostate: Secondary | ICD-10-CM | POA: Diagnosis not present

## 2018-10-10 ENCOUNTER — Encounter (INDEPENDENT_AMBULATORY_CARE_PROVIDER_SITE_OTHER): Payer: Self-pay | Admitting: Orthopaedic Surgery

## 2018-10-10 ENCOUNTER — Ambulatory Visit (INDEPENDENT_AMBULATORY_CARE_PROVIDER_SITE_OTHER): Payer: Medicare Other | Admitting: Orthopaedic Surgery

## 2018-10-10 VITALS — BP 129/66 | HR 61 | Resp 13 | Ht 74.0 in | Wt 167.0 lb

## 2018-10-10 DIAGNOSIS — M25512 Pain in left shoulder: Secondary | ICD-10-CM | POA: Diagnosis not present

## 2018-10-10 NOTE — Progress Notes (Signed)
Office Visit Note   Patient: Casey Fisher           Date of Birth: 1928/03/18           MRN: [id number] Visit Date: 10/10/2018              Requested by: [name], MD [address] [place], [place] [address] PCP: [name], MD   Assessment & Plan: Visit Diagnoses:  1. Acute pain of left shoulder     Plan: Approximately 5 weeks status post injury to left shoulder with a possible nondisplaced subtle fracture of the humeral head.  Presently doing very well and not having any more trouble and he was before the injury.  Not using a sling.  Has excellent range of motion and no functional limitations.  We will plan to see him back as needed  Follow-Up Instructions: Return if symptoms worsen or fail to improve.   Orders:  No orders of the defined types were placed in this encounter.  No orders of the defined types were placed in this encounter.     Procedures: No procedures performed   Clinical Data: No additional findings.   Subjective: Chief Complaint  Patient presents with  . Left Shoulder - Follow-up  Casey Fisher sustained an injury to his left shoulder approximately 5 weeks ago.  He fell landed on his elbows with a superior stress to his left shoulder.  X-rays were read as a possible nondisplaced fracture of the humeral head.  I did review the films and felt that if the fracture were present it was very minimal and and subtle.  He was in a sling.  He notes that presently he is back to where he was before the injury with having no discomfort with his shoulder.  He can sleep on that side and lift his arm over his head without any difficulty. HPI  Review of Systems   Objective: Vital Signs: BP 129/66 (BP Location: Right Arm, Patient Position: Sitting, Cuff Size: Normal)   Pulse 61   Resp 13   Ht 6\' 2"  (1.88 m)   Wt 167 lb (75.8 kg)   BMI 21.44 kg/m   Physical Exam  Constitutional: He is oriented to person, place, and time. He appears  well-developed and well-nourished.  HENT:  Mouth/Throat: Oropharynx is clear and moist.  Eyes: Pupils are equal, round, and reactive to light. EOM are normal.  Pulmonary/Chest: Effort normal.  Neurological: He is alert and oriented to person, place, and time.  Skin: Skin is warm and dry.  Psychiatric: He has a normal mood and affect. His behavior is normal.    Ortho Exam awake alert and oriented x3.  Appropriate.  Speech is slow but appropriate.  No pain with overhead motion of his right shoulder.  Negative impingement.  No grinding or grating.  Skin is dry but intact.  Good grip and good release.  No ecchymosis.  I can abduct to 90 degrees without any pain  Specialty Comments:  No specialty comments available.  Imaging: No results found.   PMFS History: Patient Active Problem List   Diagnosis Date Noted  . Acute pain of left shoulder 10/10/2018  . Chest pain 11/08/2017  . Memory difficulty 03/24/2016  . Hereditary and idiopathic peripheral neuropathy 03/24/2016  . Abnormality of gait 03/24/2016  . Peripheral neuropathy 04/18/2015  . Hyperlipidemia 04/18/2015  . Prostate cancer ([facility]) 08/24/2014  . Right inguinal hernia 10/30/2013   Past Medical History:  Diagnosis Date  .  Abnormality of gait 03/24/2016  . Anxiety   . Cancer ([facility])   . Cataract   . Depression   . Heart murmur   . Hereditary and idiopathic peripheral neuropathy 03/24/2016  . Hypertension   . Memory difficulty 03/24/2016  . Neuromuscular disorder ([facility])   . Neuropathy   . Prostate cancer ([facility])    remission    Family History  Problem Relation Age of Onset  . Brain cancer Daughter   . Heart disease Mother   . Stroke Mother   . Heart disease Father     Past Surgical History:  Procedure Laterality Date  . CATARACT EXTRACTION Bilateral   . EYE SURGERY    . HERNIA REPAIR  1985  . INGUINAL HERNIA REPAIR Right 12/06/2013   Procedure: HERNIA REPAIR INGUINAL ADULT;  Surgeon: Merrie Roof, MD;  Location:  [facility];  Service: General;  Laterality: Right;  . INSERTION OF MESH Right 12/06/2013   Procedure: INSERTION OF MESH;  Surgeon: Merrie Roof, MD;  Location: [facility];  Service: General;  Laterality: Right;  . PROSTATE SURGERY     Social History   Occupational History  . Not on file  Tobacco Use  . Smoking status: Former Research scientist (life sciences)  . Smokeless tobacco: Never Used  Substance and Sexual Activity  . Alcohol use: No  . Drug use: No  . Sexual activity: Not on file     Garald Balding, MD   Note - This record has been created using [employer].  Chart creation errors have been sought, but may not always  have been located. Such creation errors do not reflect on  the standard of medical care.

## 2018-10-10 NOTE — Progress Notes (Deleted)
Office Visit Note   Patient: Casey Fisher           Date of Birth: [birth year]           MRN: [id number] Visit Date: 10/10/2018              Requested by: [name], MD [address] [place], [place] [address] PCP: [name], MD   Assessment & Plan: Visit Diagnoses: No diagnosis found.  Plan: ***  Follow-Up Instructions: No follow-ups on file.   Orders:  No orders of the defined types were placed in this encounter.  No orders of the defined types were placed in this encounter.     Procedures: No procedures performed   Clinical Data: No additional findings.   Subjective: Chief Complaint  Patient presents with  . Left Shoulder - Follow-up    HPI  Review of Systems  Constitutional: Negative for chills, fatigue and fever.  HENT: Negative for mouth sores, sore throat and tinnitus.   Eyes: Negative for redness and itching.  Respiratory: Negative for shortness of breath and wheezing.   Cardiovascular: Negative for chest pain and leg swelling.  Gastrointestinal: Negative for abdominal pain, constipation and diarrhea.  Endocrine: Negative for polyuria.  Genitourinary: Negative for discharge and penile pain.  Musculoskeletal: Positive for gait problem. Negative for back pain.  Skin: Negative for rash.  Allergic/Immunologic: Negative for immunocompromised state.  Neurological: Negative for dizziness and headaches.  Hematological: Does not bruise/bleed easily.  Psychiatric/Behavioral: Negative for behavioral problems. The patient is not nervous/anxious.      Objective: Vital Signs: Resp 13   Ht 6\' 2"  (1.88 m)   Wt 167 lb (75.8 kg)   BMI 21.44 kg/m   Physical Exam  Ortho Exam  Specialty Comments:  No specialty comments available.  Imaging: No results found.   PMFS History: Patient Active Problem List   Diagnosis Date Noted  . Chest pain 11/08/2017  . Memory difficulty 03/24/2016  . Hereditary and idiopathic peripheral  neuropathy 03/24/2016  . Abnormality of gait 03/24/2016  . Peripheral neuropathy 04/18/2015  . Hyperlipidemia 04/18/2015  . Prostate cancer ([facility]) 08/24/2014  . Right inguinal hernia 10/30/2013   Past Medical History:  Diagnosis Date  . Abnormality of gait 03/24/2016  . Anxiety   . Cancer ([facility])   . Cataract   . Depression   . Heart murmur   . Hereditary and idiopathic peripheral neuropathy 03/24/2016  . Hypertension   . Memory difficulty 03/24/2016  . Neuromuscular disorder ([facility])   . Neuropathy   . Prostate cancer ([facility])    remission    Family History  Problem Relation Age of Onset  . Brain cancer Daughter   . Heart disease Mother   . Stroke Mother   . Heart disease Father     Past Surgical History:  Procedure Laterality Date  . CATARACT EXTRACTION Bilateral   . EYE SURGERY    . HERNIA REPAIR  1985  . INGUINAL HERNIA REPAIR Right 12/06/2013   Procedure: HERNIA REPAIR INGUINAL ADULT;  Surgeon: Merrie Roof, MD;  Location: [facility];  Service: General;  Laterality: Right;  . INSERTION OF MESH Right 12/06/2013   Procedure: INSERTION OF MESH;  Surgeon: Merrie Roof, MD;  Location: [facility];  Service: General;  Laterality: Right;  . PROSTATE SURGERY     Social History   Occupational History  . Not on file  Tobacco Use  . Smoking status: Former Research scientist (life sciences)  . Smokeless  tobacco: Never Used  Substance and Sexual Activity  . Alcohol use: No  . Drug use: No  . Sexual activity: Not on file

## 2018-10-12 ENCOUNTER — Ambulatory Visit (INDEPENDENT_AMBULATORY_CARE_PROVIDER_SITE_OTHER): Payer: Medicare Other | Admitting: Orthotics

## 2018-10-12 DIAGNOSIS — R2681 Unsteadiness on feet: Secondary | ICD-10-CM | POA: Diagnosis not present

## 2018-10-12 DIAGNOSIS — M79672 Pain in left foot: Secondary | ICD-10-CM | POA: Diagnosis not present

## 2018-10-12 DIAGNOSIS — M79671 Pain in right foot: Secondary | ICD-10-CM | POA: Diagnosis not present

## 2018-10-12 DIAGNOSIS — G629 Polyneuropathy, unspecified: Secondary | ICD-10-CM | POA: Diagnosis not present

## 2018-10-12 DIAGNOSIS — R310 Gross hematuria: Secondary | ICD-10-CM | POA: Diagnosis not present

## 2018-10-12 DIAGNOSIS — R3914 Feeling of incomplete bladder emptying: Secondary | ICD-10-CM | POA: Diagnosis not present

## 2018-10-18 NOTE — Progress Notes (Signed)
Patient came in today to pick up Moore Balance Brace (Bilateral).   Patient was able to don brace independently and brace was check for fit to custom.   Patient was observed walking with brace and gait was improved as well as stability.   Patient was advised of care and wearing instructions.  Advised to notify practice if there were any issues; especially skin irritation.  

## 2018-11-02 DIAGNOSIS — N35012 Post-traumatic membranous urethral stricture: Secondary | ICD-10-CM | POA: Diagnosis not present

## 2018-11-02 DIAGNOSIS — R310 Gross hematuria: Secondary | ICD-10-CM | POA: Diagnosis not present

## 2018-11-02 DIAGNOSIS — C61 Malignant neoplasm of prostate: Secondary | ICD-10-CM | POA: Diagnosis not present

## 2018-11-21 ENCOUNTER — Ambulatory Visit (INDEPENDENT_AMBULATORY_CARE_PROVIDER_SITE_OTHER): Payer: Medicare Other

## 2018-11-21 DIAGNOSIS — Z23 Encounter for immunization: Secondary | ICD-10-CM

## 2018-11-29 ENCOUNTER — Ambulatory Visit (INDEPENDENT_AMBULATORY_CARE_PROVIDER_SITE_OTHER): Payer: Medicare Other | Admitting: Sports Medicine

## 2018-11-29 ENCOUNTER — Encounter: Payer: Self-pay | Admitting: Sports Medicine

## 2018-11-29 DIAGNOSIS — R269 Unspecified abnormalities of gait and mobility: Secondary | ICD-10-CM

## 2018-11-29 DIAGNOSIS — G629 Polyneuropathy, unspecified: Secondary | ICD-10-CM

## 2018-11-29 DIAGNOSIS — B351 Tinea unguium: Secondary | ICD-10-CM | POA: Diagnosis not present

## 2018-11-29 DIAGNOSIS — M79672 Pain in left foot: Secondary | ICD-10-CM

## 2018-11-29 DIAGNOSIS — R2681 Unsteadiness on feet: Secondary | ICD-10-CM

## 2018-11-29 DIAGNOSIS — M79671 Pain in right foot: Secondary | ICD-10-CM

## 2018-11-29 NOTE — Progress Notes (Signed)
Patient ID: Casey Fisher, male   DOB: [birth year], 84 y.o.   MRN: [id number]  Subjective: GUISEPPE Fisher is a 84 y.o. male patient seen today in office with complaint of painful thickened and elongated toenails; unable to trim. Patient states that he could not get used to orthopedic shoes. Patient denies any other pedal complaints at this time.    Patient Active Problem List   Diagnosis Date Noted  . Acute pain of left shoulder 10/10/2018  . Chest pain 11/08/2017  . Memory difficulty 03/24/2016  . Hereditary and idiopathic peripheral neuropathy 03/24/2016  . Abnormality of gait 03/24/2016  . Peripheral neuropathy 04/18/2015  . Hyperlipidemia 04/18/2015  . Prostate cancer ([facility]) 08/24/2014  . Right inguinal hernia 10/30/2013    Current Outpatient Medications on File Prior to Visit  Medication Sig Dispense Refill  . aspirin EC 81 MG tablet Take 81 mg by mouth daily. Reported on 05/14/2016    . Calcium Carb-Cholecalciferol (CALCIUM 600 + D PO) Take 1 tablet by mouth daily.    . cephALEXin (KEFLEX) 500 MG capsule TK 1 C PO BID  0  . DULoxetine (CYMBALTA) 30 MG capsule Take 1 capsule (30 mg total) by mouth daily. 30 capsule 11  . ERLEADA 60 MG tablet     . gabapentin (NEURONTIN) 600 MG tablet Take 1 tablet (600 mg total) by mouth 3 (three) times daily. 450 tablet 1  . hydrochlorothiazide (HYDRODIURIL) 25 MG tablet Take 1 tablet (25 mg total) by mouth daily. 90 tablet 1  . Leuprolide Acetate (LUPRON IJ) Inject 1 application as directed every 6 (six) months.    Marland Kitchen RAPAFLO 8 MG CAPS capsule   0  . simvastatin (ZOCOR) 40 MG tablet Take 1 tablet (40 mg total) by mouth daily. 90 tablet 1  . tamsulosin (FLOMAX) 0.4 MG CAPS capsule Take 0.4 mg daily by mouth. Reported on 05/14/2016  1   No current facility-administered medications on file prior to visit.     Allergies  Allergen Reactions  . Lyrica [Pregabalin] Other (See Comments)    "constipation"    Objective: Physical  Exam  General: Well developed, nourished, no acute distress, awake, alert and oriented x 3, cane assisted gait  Vascular: Dorsalis pedis artery 2/4 bilateral, Posterior tibial artery 0/4 bilateral due to trace edema, skin temperature warm to warm proximal to distal bilateral lower extremities, + varicosities, Decreased pedal hair present bilateral.  Neurological: Gross sensation present via light touch bilateral. Protective and vibratory sensation decreased bilateral.   Dermatological: Skin is warm, dry, and supple bilateral, Nails 1-10 are tender, long, thick, and discolored with mild subungal debris, no webspace macerations present bilateral, no open lesions present bilateral, no callus/corns/hyperkeratotic tissue present bilateral. No signs of infection bilateral.  Musculoskeletal:  Asymptomatic bunion and hammertoe boney deformities noted bilateral. Muscular strength within normal limits without painon range of motion. No pain with calf compression bilateral.  Assessment and Plan:  Problem List Items Addressed This Visit      Other   Abnormality of gait    Other Visit Diagnoses    Dermatophytosis of nail    -  Primary   Neuropathy       Gait instability       Foot pain, bilateral          -Examined patient.  -Mechanically debrided and reduced mycotic nails with sterile nail nipper and dremel nail file without incident  -Patient to return in 3 months for follow up evaluation or sooner if  symptoms worsen.  Casey Fisher, DPM

## 2018-12-15 DIAGNOSIS — C61 Malignant neoplasm of prostate: Secondary | ICD-10-CM | POA: Diagnosis not present

## 2018-12-26 DIAGNOSIS — N35011 Post-traumatic bulbous urethral stricture: Secondary | ICD-10-CM | POA: Diagnosis not present

## 2018-12-26 DIAGNOSIS — C61 Malignant neoplasm of prostate: Secondary | ICD-10-CM | POA: Diagnosis not present

## 2018-12-26 DIAGNOSIS — N5201 Erectile dysfunction due to arterial insufficiency: Secondary | ICD-10-CM | POA: Diagnosis not present

## 2019-01-05 ENCOUNTER — Ambulatory Visit (INDEPENDENT_AMBULATORY_CARE_PROVIDER_SITE_OTHER): Payer: Medicare Other | Admitting: Orthopaedic Surgery

## 2019-01-05 ENCOUNTER — Encounter (INDEPENDENT_AMBULATORY_CARE_PROVIDER_SITE_OTHER): Payer: Self-pay | Admitting: Orthopaedic Surgery

## 2019-01-05 ENCOUNTER — Ambulatory Visit (INDEPENDENT_AMBULATORY_CARE_PROVIDER_SITE_OTHER): Payer: Medicare Other

## 2019-01-05 VITALS — BP 118/61 | HR 77 | Wt 167.0 lb

## 2019-01-05 DIAGNOSIS — M25511 Pain in right shoulder: Secondary | ICD-10-CM | POA: Diagnosis not present

## 2019-01-05 DIAGNOSIS — M25512 Pain in left shoulder: Secondary | ICD-10-CM | POA: Diagnosis not present

## 2019-01-05 MED ORDER — LIDOCAINE HCL 2 % IJ SOLN
2.0000 mL | INTRAMUSCULAR | Status: AC | PRN
  Administered 2019-01-05: 2 mL

## 2019-01-05 MED ORDER — BUPIVACAINE HCL 0.5 % IJ SOLN
2.0000 mL | INTRAMUSCULAR | Status: AC | PRN
  Administered 2019-01-05: 2 mL via INTRA_ARTICULAR

## 2019-01-05 MED ORDER — METHYLPREDNISOLONE ACETATE 40 MG/ML IJ SUSP
80.0000 mg | INTRAMUSCULAR | Status: AC | PRN
  Administered 2019-01-05: 80 mg

## 2019-01-05 NOTE — Progress Notes (Signed)
Office Visit Note   Patient: Casey Fisher           Date of Birth: 1928-09-03           MRN: [id number] Visit Date: 01/05/2019              Requested by: Casey Agreste, MD [address] [place], [place] [address] PCP: Casey Agreste, MD   Assessment & Plan: Visit Diagnoses:  1. Right shoulder pain, unspecified chronicity   2. Acute pain of left shoulder     Plan: Very well without problems with his left shoulder.  Does have impingement on the right shoulder will inject subacromial space with cortisone and monitor response. could have a small rotator cuff tear with a prominent subacromial spurring but no obvious elevation of the humeral head .  Follow-Up Instructions: No follow-ups on file.   Orders:  Orders Placed This Encounter  Procedures  . XR Shoulder Right   No orders of the defined types were placed in this encounter.     Procedures: Large Joint Inj: R subacromial bursa on 01/05/2019 4:35 PM Indications: pain and diagnostic evaluation Details: 25 G 1.5 in needle, anterolateral approach  Arthrogram: No  Medications: 2 mL lidocaine 2 %; 2 mL bupivacaine 0.5 %; 80 mg methylPREDNISolone acetate 40 MG/ML Consent was given by the patient. Immediately prior to procedure a time out was called to verify the correct patient, procedure, equipment, support staff and site/side marked as required. Patient was prepped and draped in the usual sterile fashion.       Clinical Data: No additional findings.   Subjective: Chief Complaint  Patient presents with  . Right Shoulder - Pain  . Shoulder Pain    pt stated rt lower blade of the shoulder is painful.  Casey Fisher was last seen about 3 months ago for follow-up evaluation the problem with his left shoulder.  He had fallen at home in August and sustained a nondisplaced fracture of the lateral aspect of the left humeral head.  Resolved without any further problem he notes he is doing well does not have any  issues.  He thinks that he injured his right shoulder at the same time but never been "mentioned it" because he was having trouble with the left.  Now he notes he is having trouble raising his right arm over his head.  No trouble sleeping.  Some pain with certain motions particularly with his arm at the eye level or above.  No numbness or tingling.  No arm or elbow discomfort.  No trouble referable to his neck  HPI  Review of Systems   Objective: Vital Signs: BP 118/61 (BP Location: Left Arm, Patient Position: Sitting, Cuff Size: Normal)   Pulse 77   Wt 167 lb (75.8 kg)   BMI 21.44 kg/m   Physical Exam Constitutional:      Appearance: He is well-developed.  Eyes:     Pupils: Pupils are equal, round, and reactive to light.  Pulmonary:     Effort: Pulmonary effort is normal.  Skin:    General: Skin is warm and dry.  Neurological:     Mental Status: He is alert and oriented to person, place, and time.  Psychiatric:        Behavior: Behavior normal.     Ortho Exam full active motion of left shoulder with negative impingement.  No crepitation.  Good grip and good release.  Mild impingement testing right shoulder with internal/external rotation.  Able to place the arm fully over his head.  Muscles are atrophied but appear to be intact grip and release  Specialty Comments:  No specialty comments available.  Imaging: No results found.   PMFS History: Patient Active Problem List   Diagnosis Date Noted  . Acute pain of left shoulder 10/10/2018  . Chest pain 11/08/2017  . Memory difficulty 03/24/2016  . Hereditary and idiopathic peripheral neuropathy 03/24/2016  . Abnormality of gait 03/24/2016  . Peripheral neuropathy 04/18/2015  . Hyperlipidemia 04/18/2015  . Prostate cancer ([facility]) 08/24/2014  . Right inguinal hernia 10/30/2013   Past Medical History:  Diagnosis Date  . Abnormality of gait 03/24/2016  . Anxiety   . Cancer ([facility])   . Cataract   . Depression   . Heart  murmur   . Hereditary and idiopathic peripheral neuropathy 03/24/2016  . Hypertension   . Memory difficulty 03/24/2016  . Neuromuscular disorder ([facility])   . Neuropathy   . Prostate cancer ([facility])    remission    Family History  Problem Relation Age of Onset  . Brain cancer Daughter   . Heart disease Mother   . Stroke Mother   . Heart disease Father     Past Surgical History:  Procedure Laterality Date  . CATARACT EXTRACTION Bilateral   . EYE SURGERY    . HERNIA REPAIR  1985  . INGUINAL HERNIA REPAIR Right 12/06/2013   Procedure: HERNIA REPAIR INGUINAL ADULT;  Surgeon: Merrie Roof, MD;  Location: [facility];  Service: General;  Laterality: Right;  . INSERTION OF MESH Right 12/06/2013   Procedure: INSERTION OF MESH;  Surgeon: Merrie Roof, MD;  Location: [facility];  Service: General;  Laterality: Right;  . PROSTATE SURGERY     Social History   Occupational History  . Not on file  Tobacco Use  . Smoking status: Former Research scientist (life sciences)  . Smokeless tobacco: Never Used  Substance and Sexual Activity  . Alcohol use: No  . Drug use: No  . Sexual activity: Not on file

## 2019-01-09 ENCOUNTER — Encounter

## 2019-01-28 ENCOUNTER — Other Ambulatory Visit: Payer: Self-pay | Admitting: Family Medicine

## 2019-01-28 DIAGNOSIS — E785 Hyperlipidemia, unspecified: Secondary | ICD-10-CM

## 2019-02-23 ENCOUNTER — Other Ambulatory Visit: Payer: Self-pay | Admitting: Family Medicine

## 2019-02-23 DIAGNOSIS — G629 Polyneuropathy, unspecified: Secondary | ICD-10-CM

## 2019-03-01 ENCOUNTER — Ambulatory Visit (INDEPENDENT_AMBULATORY_CARE_PROVIDER_SITE_OTHER): Payer: Medicare Other | Admitting: Podiatry

## 2019-03-01 DIAGNOSIS — G629 Polyneuropathy, unspecified: Secondary | ICD-10-CM

## 2019-03-01 DIAGNOSIS — B351 Tinea unguium: Secondary | ICD-10-CM

## 2019-03-01 NOTE — Patient Instructions (Signed)

## 2019-03-08 ENCOUNTER — Other Ambulatory Visit: Payer: Self-pay | Admitting: Nurse Practitioner

## 2019-03-08 ENCOUNTER — Encounter: Payer: Self-pay | Admitting: Podiatry

## 2019-03-08 NOTE — Progress Notes (Signed)
Subjective: Casey Fisher presents today with painful, thick toenails 1-5 b/l that he cannot cut and which interfere with daily activities.  Pain is aggravated when wearing enclosed shoe gear.  Mr. Driggers voices no new pedal concerns on today's visit.  [name], MD is his PCP. Last visit was 08/31/2018.   Current Outpatient Medications:  .  aspirin EC 81 MG tablet, Take 81 mg by mouth daily. Reported on 05/14/2016, Disp: , Rfl:  .  Calcium Carb-Cholecalciferol (CALCIUM 600 + D PO), Take 1 tablet by mouth daily., Disp: , Rfl:  .  DULoxetine (CYMBALTA) 30 MG capsule, TAKE 1 CAPSULE BY MOUTH ONCE DAILY, Disp: 30 capsule, Rfl: 11 .  ERLEADA 60 MG tablet, , Disp: , Rfl:  .  gabapentin (NEURONTIN) 600 MG tablet, TAKE 2 TABLET BY MOUTH IN MORNING AND 2 TABLET AT MIDDAY AND 1 TABLET IN EVENING, Disp: 450 tablet, Rfl: 1 .  hydrochlorothiazide (HYDRODIURIL) 25 MG tablet, Take 1 tablet (25 mg total) by mouth daily., Disp: 90 tablet, Rfl: 1 .  Leuprolide Acetate (LUPRON IJ), Inject 1 application as directed every 6 (six) months., Disp: , Rfl:  .  RAPAFLO 8 MG CAPS capsule, , Disp: , Rfl: 0 .  simvastatin (ZOCOR) 40 MG tablet, TAKE 1 TABLET BY MOUTH ONCE DAILY, Disp: 90 tablet, Rfl: 0 .  tamsulosin (FLOMAX) 0.4 MG CAPS capsule, Take 0.4 mg daily by mouth. Reported on 05/14/2016, Disp: , Rfl: 1  Allergies  Allergen Reactions  . Lyrica [Pregabalin] Other (See Comments)    "constipation"    Objective:  Vascular Examination: Capillary refill time immediate x 10 digits.  Dorsalis pedis 2/4 b/l.  Posterior tibial pulses 0/4 b/l.  Digital hair decreaed  x 10 digits.  Skin temperature gradient WNL b/l.  +Varicosities b/l.  Dermatological Examination: Skin with normal turgor, texture and tone b/l.  Toenails 1-5 b/l discolored, thick, dystrophic with subungual debris and pain with palpation to nailbeds due to thickness of nails.  Musculoskeletal: Muscle strength 5/5 to all LE  muscle groups  No gross bony deformities b/l.  No pain, crepitus or joint limitation noted with ROM.   Neurological: Sensation decreased with 10 gram monofilament b/l.  Vibratory sensation decreased b/l.  Assessment: 1. Painful onychomycosis toenails 1-5 b/l  2. Neuropathy  Plan: 1. Toenails 1-5 b/l were debrided in length and girth without iatrogenic bleeding. 2. Patient to continue soft, supportive shoe gear. 3. Patient to report any pedal injuries to medical professional immediately. 4. Follow up 3 months.  5. Patient/POA to call should there be a concern in the interim.

## 2019-03-27 ENCOUNTER — Telehealth: Payer: Self-pay | Admitting: *Deleted

## 2019-03-27 NOTE — Telephone Encounter (Signed)
Schedule awv  

## 2019-03-30 NOTE — Telephone Encounter (Signed)
Pt called and stated that he is currently in another [place] and will not return until covid-19 is over. Please advise   [phone]

## 2019-03-31 NOTE — Telephone Encounter (Signed)
Once patient returns we will schedule him at that time.

## 2019-04-01 ENCOUNTER — Other Ambulatory Visit: Payer: Self-pay | Admitting: Family Medicine

## 2019-04-01 DIAGNOSIS — I10 Essential (primary) hypertension: Secondary | ICD-10-CM

## 2019-05-10 ENCOUNTER — Other Ambulatory Visit: Payer: Self-pay | Admitting: Family Medicine

## 2019-05-10 DIAGNOSIS — E785 Hyperlipidemia, unspecified: Secondary | ICD-10-CM

## 2019-05-10 NOTE — Telephone Encounter (Signed)
Requested medication (s) are due for refill today: yes  Requested medication (s) are on the active medication list: yes  Last refill:  01/30/19  Future visit scheduled: no  Notes to clinic:  antilipid statins failed.  Requested Prescriptions  Pending Prescriptions Disp Refills   simvastatin (ZOCOR) 40 MG tablet [Pharmacy Med Name: SIMVASTATIN 40MG  TABLETS] 90 tablet 0    Sig: TAKE 1 TABLET BY MOUTH ONCE DAILY     Cardiovascular:  Antilipid - Statins Failed - 05/10/2019  3:32 PM      Failed - LDL in normal range and within 360 days    LDL Calculated  Date Value Ref Range Status  07/11/2018 103 (H) 0 - 99 mg/dL Final         Passed - Total Cholesterol in normal range and within 360 days    Cholesterol, Total  Date Value Ref Range Status  07/11/2018 174 100 - 199 mg/dL Final         Passed - HDL in normal range and within 360 days    HDL  Date Value Ref Range Status  07/11/2018 55 >39 mg/dL Final         Passed - Triglycerides in normal range and within 360 days    Triglycerides  Date Value Ref Range Status  07/11/2018 79 0 - 149 mg/dL Final         Passed - Patient is not pregnant      Passed - Valid encounter within last 12 months    Recent Outpatient Visits          8 months ago Other closed nondisplaced fracture of proximal end of left humerus, initial encounter   [facility], [place] D, PA-C   8 months ago Periorbital contusion of left eye, initial encounter   [facility], Ranell Patrick, MD   10 months ago Essential hypertension   [facility], Ranell Patrick, MD   1 year ago Peripheral polyneuropathy   [facility], Ranell Patrick, MD   1 year ago Peripheral polyneuropathy   [facility], Ranell Patrick, MD

## 2019-05-23 ENCOUNTER — Telehealth: Payer: Self-pay | Admitting: Neurology

## 2019-05-23 NOTE — Telephone Encounter (Signed)
Due to current COVID 19 pandemic, our office is severely reducing in office visits until further notice, in order to minimize the risk to our patients and healthcare providers.   Called patient regarding his 5/28 appt. Patient accepted an in office visit  office visit with some precautions. Patient understands that upon arrival he will have his temperature checked/will be screened for COVID. Patient is aware that only one person is allowed in office with him if necessary and will also have temp checked.

## 2019-05-25 ENCOUNTER — Ambulatory Visit (INDEPENDENT_AMBULATORY_CARE_PROVIDER_SITE_OTHER): Payer: Medicare Other | Admitting: Neurology

## 2019-05-25 ENCOUNTER — Ambulatory Visit: Payer: Medicare Other | Admitting: Podiatry

## 2019-05-25 ENCOUNTER — Encounter: Payer: Self-pay | Admitting: Neurology

## 2019-05-25 ENCOUNTER — Encounter: Payer: Self-pay | Admitting: Podiatry

## 2019-05-25 ENCOUNTER — Ambulatory Visit (INDEPENDENT_AMBULATORY_CARE_PROVIDER_SITE_OTHER): Payer: Medicare Other | Admitting: Podiatry

## 2019-05-25 ENCOUNTER — Other Ambulatory Visit: Payer: Self-pay

## 2019-05-25 VITALS — Temp 97.3°F

## 2019-05-25 VITALS — BP 140/65 | HR 74 | Temp 99.4°F | Ht 74.0 in | Wt 175.0 lb

## 2019-05-25 DIAGNOSIS — R413 Other amnesia: Secondary | ICD-10-CM | POA: Diagnosis not present

## 2019-05-25 DIAGNOSIS — R269 Unspecified abnormalities of gait and mobility: Secondary | ICD-10-CM | POA: Diagnosis not present

## 2019-05-25 DIAGNOSIS — M79674 Pain in right toe(s): Secondary | ICD-10-CM

## 2019-05-25 DIAGNOSIS — G609 Hereditary and idiopathic neuropathy, unspecified: Secondary | ICD-10-CM | POA: Diagnosis not present

## 2019-05-25 DIAGNOSIS — L853 Xerosis cutis: Secondary | ICD-10-CM | POA: Diagnosis not present

## 2019-05-25 DIAGNOSIS — M79675 Pain in left toe(s): Secondary | ICD-10-CM | POA: Diagnosis not present

## 2019-05-25 DIAGNOSIS — B351 Tinea unguium: Secondary | ICD-10-CM | POA: Diagnosis not present

## 2019-05-25 DIAGNOSIS — G629 Polyneuropathy, unspecified: Secondary | ICD-10-CM

## 2019-05-25 MED ORDER — DULOXETINE HCL 30 MG PO CPEP: 30.0000 mg | ORAL_CAPSULE | Freq: Every day | ORAL | 5 refills | Status: DC

## 2019-05-25 MED ORDER — AMMONIUM LACTATE 12 % EX LOTN: TOPICAL_LOTION | CUTANEOUS | 4 refills | Status: DC

## 2019-05-25 NOTE — Patient Instructions (Signed)
We will go up on the cymbalta 30 mg to one twice a day.  Cymbalta (duloxetine) is an antidepressant medication that is commonly used for peripheral neuropathy pain or for fibromyalgia pain. As with any antidepressant medication, worsening depression can be seen. This medication can potentially cause headache, dizziness, sexual dysfunction, or nausea. If any problems are noted on this medication, please contact our office.

## 2019-05-25 NOTE — Progress Notes (Signed)
Subjective: Casey Fisher presents today for preventative foot care with history of neuropathy. Patient seen for follow up of chronic, painful mycotic toenails which interfere with daily activities and routine tasks.  Pain is aggravated when wearing enclosed shoe gear. Pain is getting progressively worse and relieved with periodic professional debridement.   [name], MD is his PCP. Last visit 08/31/2018. Next visit 05/30/2019.  He will see his Neurologist on tomorrow.   He voices no new pedal concerns.  States he is doing well with COVID-19 pandemic. Has support of his daughter who has his groceries delivered and his grandson who has his paper goods delivered to him.  Medical record reviewed.  Allergies  Allergen Reactions  . Lyrica [Pregabalin] Other (See Comments)    "constipation"    Objective: Vitals:   05/25/19 0947  Temp: (!) 97.3 F (36.3 C)   Vascular Examination: Capillary refill time immediate x 10 digits.  Dorsalis pedis pulses 2/4 b/l.  Posterior tibial pulses absent b/l.  No digital hair x 10 digits.  Skin temperature warm to cool b/l  +Varicosities b/l LE.   Dermatological Examination: Skin with normal turgor and tone b/l.  Moderate amount of dry, flaky skin noted dorsally and plantarly bl.   Toenails 1-5 b/l discolored, thick, dystrophic with subungual debris and pain with palpation to nailbeds due to thickness of nails.   Musculoskeletal: Muscle strength 5/5 to all LE muscle groups.  HAV with bunion deformity b/l.   Hammertoe deformity 2-5  b/l.   Neurological: Sensation diminished with 10 gram monofilament.  Vibratory sensation diminished. Assessment: 1. Painful onychomycosis toenails 1-5 b/l 2. Xerosis b/l feet 3. Neuropathy  Plan: 1. Toenails 1-5 b/l were debrided in length and girth without iatrogenic bleeding. 2. Prescription written for AmLactin lotion to be applied to feet bid for dry skin.  3. Patient to continue soft,  supportive shoe gear daily. 4. Patient to report any pedal injuries to medical professional immediately. 5. Follow up 3 months.  6. Patient/POA to call should there be a concern in the interim.

## 2019-05-25 NOTE — Patient Instructions (Addendum)
Ammonium Lactate skin cream What is this medicine? AMMONIUM LACTATE (uh [place] nee uh m LAK teyt) is used on the skin to treat dry skin and other skin conditions. This medicine may be used for other purposes; ask your health care provider or pharmacist if you have questions. COMMON BRAND NAME(S): Amlactin, Amlactin Ultra, LAC-cream, Lac-Hydrin What should I tell my health care provider before I take this medicine? They need to know if you have any of these conditions: -frequent exposure to sunlight -an unusual or allergic reaction to ammonium lactate, propylene glycol, other medicines, foods, dyes, or preservatives -pregnant or trying to get pregnant -breast-feeding How should I use this medicine? This medicine is for external use only. Follow the directions on the prescription label. Wash your hands before and after use. Make sure the skin is clean and dry. Apply just enough cream to cover the affected area. Rub in gently but completely. Avoid contact with broken skin such as skin cuts or abrasions. If accidental contact occurs, large amounts of water should be used to wash the affected area. Do not get this medicine in your eyes. If you do, rinse out with plenty of cool tap water. Do not use your medicine more often than directed. Do not to use more medicine than prescribed. Talk to your pediatrician regarding the use of this medicine in children. Special care may be needed. Overdosage: If you think you have taken too much of this medicine contact a poison control center or emergency room at once. NOTE: This medicine is only for you. Do not share this medicine with others. What if I miss a dose? If you miss a dose, use it as soon as you can. If it is almost time for your next dose, use only that dose. Do not use double or extra doses. What may interact with this medicine? Interactions are not expected. Do not use any other skin products without asking your doctor or health care professional. This  list may not describe all possible interactions. Give your health care provider a list of all the medicines, herbs, non-prescription drugs, or dietary supplements you use. Also tell them if you smoke, drink alcohol, or use illegal drugs. Some items may interact with your medicine. What should I watch for while using this medicine? Tell your doctor or health care professional if your skin condition gets worse or does not get better within 4 weeks. Avoid using this medicine on areas of skin that may be exposed to natural or artificial sunlight, including the face. If you can not avoid sun exposure, wear clothing to protect the skin. What side effects may I notice from receiving this medicine? Side effects that you should report to your doctor or health care professional as soon as possible: -allergic reactions like skin rash, itching or hives, swelling of the face, lips, or tongue -severe dry skin, peeling Side effects that usually do not require medical attention (report to your doctor or health care professional if they continue or are bothersome): -dry or irritated skin -increased sensitivity to the sun -mild reddening or peeling of the skin This list may not describe all possible side effects. Call your doctor for medical advice about side effects. You may report side effects to FDA at 1-800-FDA-1088. Where should I keep my medicine? Keep out of the reach of children. Store at room temperature between 15 and 30 degrees C (59 and 86 degrees F). Do not freeze. Throw away any unused medication after the expiration date. NOTE: This  sheet is a summary. It may not cover all possible information. If you have questions about this medicine, talk to your doctor, pharmacist, or health care provider.  2019 Elsevier/Gold Standard (2011-03-11 17:14:08)   Onychomycosis/Fungal Toenails  WHAT IS IT? An infection that lies within the keratin of your nail plate that is caused by a fungus.  WHY ME? Fungal  infections affect all ages, sexes, races, and creeds.  There may be many factors that predispose you to a fungal infection such as age, coexisting medical conditions such as diabetes, or an autoimmune disease; stress, medications, fatigue, genetics, etc.  Bottom line: fungus thrives in a warm, moist environment and your shoes offer such a location.  IS IT CONTAGIOUS? Theoretically, yes.  You do not want to share shoes, nail clippers or files with someone who has fungal toenails.  Walking around barefoot in the same room or sleeping in the same bed is unlikely to transfer the organism.  It is important to realize, however, that fungus can spread easily from one nail to the next on the same foot.  HOW DO WE TREAT THIS?  There are several ways to treat this condition.  Treatment may depend on many factors such as age, medications, pregnancy, liver and kidney conditions, etc.  It is best to ask your doctor which options are available to you.  1. No treatment.   Unlike many other medical concerns, you can live with this condition.  However for many people this can be a painful condition and may lead to ingrown toenails or a bacterial infection.  It is recommended that you keep the nails cut short to help reduce the amount of fungal nail. 2. Topical treatment.  These range from herbal remedies to prescription strength nail lacquers.  About 40-50% effective, topicals require twice daily application for approximately 9 to 12 months or until an entirely new nail has grown out.  The most effective topicals are medical grade medications available through physicians offices. 3. Oral antifungal medications.  With an 80-90% cure rate, the most common oral medication requires 3 to 4 months of therapy and stays in your system for a year as the new nail grows out.  Oral antifungal medications do require blood work to make sure it is a safe drug for you.  A liver function panel will be performed prior to starting the  medication and after the first month of treatment.  It is important to have the blood work performed to avoid any harmful side effects.  In general, this medication safe but blood work is required. 4. Laser Therapy.  This treatment is performed by applying a specialized laser to the affected nail plate.  This therapy is noninvasive, fast, and non-painful.  It is not covered by insurance and is therefore, out of pocket.  The results have been very good with a 80-95% cure rate.  [facility] is the only practice in the area to offer this therapy. 5. Permanent Nail Avulsion.  Removing the entire nail so that a new nail will not grow back.   Peripheral Neuropathy Peripheral neuropathy is a type of nerve damage. It affects nerves that carry signals between the spinal cord and the arms, legs, and the rest of the body (peripheral nerves). It does not affect nerves in the spinal cord or brain. In peripheral neuropathy, one nerve or a group of nerves may be damaged. Peripheral neuropathy is a broad category that includes many specific nerve disorders, like diabetic neuropathy, hereditary  neuropathy, and carpal tunnel syndrome. What are the causes? This condition may be caused by:  Diabetes. This is the most common cause of peripheral neuropathy.  Nerve injury.  Pressure or stress on a nerve that lasts a long time.  Lack (deficiency) of B vitamins. This can result from alcoholism, poor diet, or a restricted diet.  Infections.  Autoimmune diseases, such as rheumatoid arthritis and systemic lupus erythematosus.  Nerve diseases that are passed from parent to child (inherited).  Some medicines, such as cancer medicines (chemotherapy).  Poisonous (toxic) substances, such as lead and mercury.  Too little blood flowing to the legs.  Kidney disease.  Thyroid disease. In some cases, the cause of this condition is not known. What are the signs or symptoms? Symptoms of this condition depend on  which of your nerves is damaged. Common symptoms include:  Loss of feeling (numbness) in the feet, hands, or both.  Tingling in the feet, hands, or both.  Burning pain.  Very sensitive skin.  Weakness.  Not being able to move a part of the body (paralysis).  Muscle twitching.  Clumsiness or poor coordination.  Loss of balance.  Not being able to control your bladder.  Feeling dizzy.  Sexual problems. How is this diagnosed? Diagnosing and finding the cause of peripheral neuropathy can be difficult. Your health care provider will take your medical history and do a physical exam. A neurological exam will also be done. This involves checking things that are affected by your brain, spinal cord, and nerves (nervous system). For example, your health care provider will check your reflexes, how you move, and what you can feel. You may have other tests, such as:  Blood tests.  Electromyogram (EMG) and nerve conduction tests. These tests check nerve function and how well the nerves are controlling the muscles.  Imaging tests, such as CT scans or MRI to rule out other causes of your symptoms.  Removing a small piece of nerve to be examined in a lab (nerve biopsy). This is rare.  Removing and examining a small amount of the fluid that surrounds the brain and spinal cord (lumbar puncture). This is rare. How is this treated? Treatment for this condition may involve:  Treating the underlying cause of the neuropathy, such as diabetes, kidney disease, or vitamin deficiencies.  Stopping medicines that can cause neuropathy, such as chemotherapy.  Medicine to relieve pain. Medicines may include: ? Prescription or over-the-counter pain medicine. ? Antiseizure medicine. ? Antidepressants. ? Pain-relieving patches that are applied to painful areas of skin.  Surgery to relieve pressure on a nerve or to destroy a nerve that is causing pain.  Physical therapy to help improve movement and  balance.  Devices to help you move around (assistive devices). Follow these instructions at home: Medicines  Take over-the-counter and prescription medicines only as told by your health care provider. Do not take any other medicines without first asking your health care provider.  Do not drive or use heavy machinery while taking prescription pain medicine. Lifestyle   Do not use any products that contain nicotine or tobacco, such as cigarettes and e-cigarettes. Smoking keeps blood from reaching damaged nerves. If you need help quitting, ask your health care provider.  Avoid or limit alcohol. Too much alcohol can cause a vitamin B deficiency, and vitamin B is needed for healthy nerves.  Eat a healthy diet. This includes: ? Eating foods that are high in fiber, such as fresh fruits and vegetables, whole grains, and beans. ?  Limiting foods that are high in fat and processed sugars, such as fried or sweet foods. General instructions   If you have diabetes, work closely with your health care provider to keep your blood sugar under control.  If you have numbness in your feet: ? Check every day for signs of injury or infection. Watch for redness, warmth, and swelling. ? Wear padded socks and comfortable shoes. These help protect your feet.  Develop a good support system. Living with peripheral neuropathy can be stressful. Consider talking with a mental health specialist or joining a support group.  Use assistive devices and attend physical therapy as told by your health care provider. This may include using a walker or a cane.  Keep all follow-up visits as told by your health care provider. This is important. Contact a health care provider if:  You have new signs or symptoms of peripheral neuropathy.  You are struggling emotionally from dealing with peripheral neuropathy.  Your pain is not well-controlled. Get help right away if:  You have an injury or infection that is not healing  normally.  You develop new weakness in an arm or leg.  You fall frequently. Summary  Peripheral neuropathy is when the nerves in the arms, or legs are damaged, resulting in numbness, weakness, or pain.  There are many causes of peripheral neuropathy, including diabetes, pinched nerves, vitamin deficiencies, autoimmune disease, and hereditary conditions.  Diagnosing and finding the cause of peripheral neuropathy can be difficult. Your health care provider will take your medical history, do a physical exam, and do tests, including blood tests and nerve function tests.  Treatment involves treating the underlying cause of the neuropathy and taking medicines to help control pain. Physical therapy and assistive devices may also help. This information is not intended to replace advice given to you by your health care provider. Make sure you discuss any questions you have with your health care provider. Document Released: 12/04/2002 Document Revised: 02/22/2017 Document Reviewed: 02/22/2017 Elsevier Interactive Patient Education  2019 [employer].

## 2019-05-25 NOTE — Progress Notes (Signed)
Reason for visit: Peripheral neuropathy, memory disturbance  Casey Fisher is an 85 y.o. male  History of present illness:  Casey Fisher is an 85 year old right-handed black male with a history of a peripheral neuropathy associated with a gait disorder.  He claims that he has not fallen recently, his last fall was 8 months ago.  He indicates that he did get physical therapy in the home following the fall, he has now been more careful about his walking, he does have a cane and a walker.  The patient does operate a motor vehicle.  He lives alone at home, he does have a daughter who lives in [place], [place] who helps out some with him.  The patient continues to have some discomfort in the legs and feet, he remains on gabapentin 1200 mg twice during the day and 600 mg at night.  He takes 30 mg of Cymbalta daily.  He in the past has not wanted to go on any medications for his memory.  Past Medical History:  Diagnosis Date  . Abnormality of gait 03/24/2016  . Anxiety   . Cancer ([place])   . Cataract   . Depression   . Heart murmur   . Hereditary and idiopathic peripheral neuropathy 03/24/2016  . Hypertension   . Memory difficulty 03/24/2016  . Neuromuscular disorder ([place])   . Neuropathy   . Prostate cancer ([place])    remission    Past Surgical History:  Procedure Laterality Date  . CATARACT EXTRACTION Bilateral   . EYE SURGERY    . HERNIA REPAIR  1985  . INGUINAL HERNIA REPAIR Right 12/06/2013   Procedure: HERNIA REPAIR INGUINAL ADULT;  Surgeon: Merrie Roof, MD;  Location: [facility];  Service: General;  Laterality: Right;  . INSERTION OF MESH Right 12/06/2013   Procedure: INSERTION OF MESH;  Surgeon: Merrie Roof, MD;  Location: [facility];  Service: General;  Laterality: Right;  . PROSTATE SURGERY      Family History  Problem Relation Age of Onset  . Brain cancer Daughter   . Heart disease Mother   . Stroke Mother   . Heart disease Father     Social history:   reports that he has quit smoking. He has never used smokeless tobacco. He reports that he does not drink alcohol or use drugs.    Allergies  Allergen Reactions  . Lyrica [Pregabalin] Other (See Comments)    "constipation"    Medications:  Prior to Admission medications   Medication Sig Start Date End Date Taking? Authorizing Provider  ammonium lactate (AMLACTIN) 12 % lotion Apply to both feet twice daily for dry skin 05/25/19  Yes [place], Stephani Police, DPM  aspirin EC 81 MG tablet Take 81 mg by mouth daily. Reported on 05/14/2016   Yes [provider]  Calcium Carb-Cholecalciferol (CALCIUM 600 + D PO) Take 1 tablet by mouth daily.   Yes [provider]  DULoxetine (CYMBALTA) 30 MG capsule TAKE 1 CAPSULE BY MOUTH ONCE DAILY 03/08/19  Yes Dennie Bible, NP  ERLEADA 60 MG tablet  06/06/18  Yes [provider]  gabapentin (NEURONTIN) 600 MG tablet TAKE 2 TABLET BY MOUTH IN MORNING AND 2 TABLET AT MIDDAY AND 1 TABLET IN EVENING 02/23/19  Yes [name], MD  hydrochlorothiazide (HYDRODIURIL) 25 MG tablet TAKE 1 TABLET BY MOUTH EVERY DAY 04/01/19  Yes [name], MD  Leuprolide Acetate (LUPRON IJ) Inject 1 application as directed every  6 (six) months.   Yes [provider]  RAPAFLO 8 MG CAPS capsule  11/09/17  Yes [provider]  simvastatin (ZOCOR) 40 MG tablet TAKE 1 TABLET BY MOUTH ONCE DAILY 01/30/19  Yes [name], MD  tamsulosin (FLOMAX) 0.4 MG CAPS capsule Take 0.4 mg daily by mouth. Reported on 05/14/2016 08/17/15  Yes [provider]    ROS:  Out of a complete 14 system review of symptoms, the patient complains only of the following symptoms, and all other reviewed systems are negative.  Memory problems Joint pains Walking problems  Blood pressure 140/65, pulse 74, temperature 99.4 F (37.4 C), temperature source Oral, height 6\' 2"  (1.88 m), weight 175 lb (79.4 kg).  Physical Exam  General: The patient is  alert and cooperative at the time of the examination.  Skin: 2-3+ edema is noted below the knees bilaterally.   Neurologic Exam  Mental status: The patient is alert and oriented x 3 at the time of the examination. The Mini-Mental status examination done today shows a total score of 21/30.   Cranial nerves: Facial symmetry is present. Speech is normal, no aphasia or dysarthria is noted. Extraocular movements are full. Visual fields are full.  Motor: The patient has good strength in all 4 extremities.  Sensory examination: Soft touch sensation is symmetric on the face, arms, and legs.  Coordination: The patient has good finger-nose-finger and heel-to-shin bilaterally.  Gait and station: The patient has a stooped posture, he has a shuffling gait, gait is slightly wide-based, he uses a cane for ambulation.  Gait is slow and deliberate.  Tandem gait was not attempted.  Reflexes: Deep tendon reflexes are symmetric.   Assessment/Plan:  1.  Memory disturbance  2.  Peripheral neuropathy  3.  Gait disturbance  The patient will be increased on the Cymbalta taking 30 mg twice daily, he will continue the gabapentin as above.  He does not wish to go on any medications for memory.  He will follow-up here in 6 months.  Jill Alexanders MD 05/25/2019 2:28 PM  [facility] [address] [place], [place] [address]  Phone [phone] Fax [phone]

## 2019-05-30 ENCOUNTER — Telehealth (INDEPENDENT_AMBULATORY_CARE_PROVIDER_SITE_OTHER): Payer: Medicare Other | Admitting: Family Medicine

## 2019-05-30 ENCOUNTER — Other Ambulatory Visit: Payer: Self-pay

## 2019-05-30 DIAGNOSIS — I10 Essential (primary) hypertension: Secondary | ICD-10-CM

## 2019-05-30 DIAGNOSIS — G629 Polyneuropathy, unspecified: Secondary | ICD-10-CM

## 2019-05-30 DIAGNOSIS — E785 Hyperlipidemia, unspecified: Secondary | ICD-10-CM

## 2019-05-30 DIAGNOSIS — C61 Malignant neoplasm of prostate: Secondary | ICD-10-CM

## 2019-05-30 DIAGNOSIS — R350 Frequency of micturition: Secondary | ICD-10-CM

## 2019-05-30 MED ORDER — GABAPENTIN 600 MG PO TABS: ORAL_TABLET | ORAL | 1 refills | Status: DC

## 2019-05-30 MED ORDER — AMLODIPINE BESYLATE 5 MG PO TABS: 5.0000 mg | ORAL_TABLET | Freq: Every day | ORAL | 1 refills | Status: DC

## 2019-05-30 NOTE — Patient Instructions (Addendum)
  Continue same dose of gabapentin and higher dose of cymbalta.   STOP hydrochlorothiazide to see if that helps with frequent urination in day.  Start amlodipine 5mg  per day for blood pressure, and follow up in office in next 2 weeks to recheck blood pressure, and check some bloodwork at that time.  If any lightheadedness or dizziness, be seen right away to evaluate your pressure.    If you have lab work done today you will be contacted with your lab results within the next 2 weeks.  If you have not heard from [place] then please contact us. The fastest way to get your results is to register for My Chart.   IF you received an x-ray today, you will receive an invoice from [facility]. Please contact [facility] at [phone] with questions or concerns regarding your invoice.   IF you received labwork today, you will receive an invoice from [facility]. Please contact [facility] at [phone] with questions or concerns regarding your invoice.   Our billing staff will not be able to assist you with questions regarding bills from these companies.  You will be contacted with the lab results as soon as they are available. The fastest way to get your results is to activate your My Chart account. Instructions are located on the last page of this paperwork. If you have not heard from [place] regarding the results in 2 weeks, please contact this office.

## 2019-05-30 NOTE — Progress Notes (Signed)
Virtual Visit via Telephone Note  I connected with Casey Fisher on 05/30/19 at 11:30 AM by telephone and verified that I am speaking with the correct person using two identifiers.   I discussed the limitations, risks, security and privacy concerns of performing an evaluation and management service by telephone and the availability of in person appointments. I also discussed with the patient that there may be a patient responsible charge related to this service. The patient expressed understanding and agreed to proceed, consent obtained.   Chief complaint: Med refills, urinary frequency.    History of Present Illness: Casey Fisher is a 85 y.o. male  Peripheral neuropathy: History of hereditary, idiopathic peripheral neuropathy followed by neurology, Dr. [name], last appointment May 28.  Associated with gait disorder.  Last fall 8 months ago based on that note, status post physical therapy after fall, has a cane and a walker.  Was taking 1200 mg twice per day of gabapentin followed by 600 mg at night.  Was on 30 mg of Cymbalta at that time, and declined any medications for memory deficit.  Cymbalta was increased to 30 mg twice daily, continued same dose of gabapentin. No new side effects on higher dose of cymbalta.   Hypertension: BP Readings from Last 3 Encounters:  05/25/19 140/65  01/05/19 118/61  10/10/18 129/66   Lab Results  Component Value Date   CREATININE 0.95 07/11/2018   Overdue for blood work.  Has taken HCTZ 25 mg daily.  Constitutional: Negative for fatigue and unexpected weight change.  Eyes: Negative for visual disturbance.  Respiratory: Negative for cough, chest tightness and shortness of breath.   Cardiovascular: Negative for chest pain, palpitations and leg swelling.  Gastrointestinal: Negative for abdominal pain and blood in stool.  Neurological: Negative for dizziness, light-headedness and headaches.     Hyperlipidemia:  Lab Results   Component Value Date   CHOL 174 07/11/2018   HDL 55 07/11/2018   LDLCALC 103 (H) 07/11/2018   TRIG 79 07/11/2018   CHOLHDL 3.2 07/11/2018   Lab Results  Component Value Date   ALT 10 07/11/2018   AST 19 07/11/2018   ALKPHOS 65 07/11/2018   BILITOT 0.4 07/11/2018  Has taken simvastatin 40 mg daily, overdue for blood work. No new myalgias.   Prostate cancer Followed by [facility], last visit 12/26/2018, Dr. Junious Silk.  Previously on [place] trial, took himself off, continued Lupron with last injection in December..  Erectile dysfunction treatment was discussed.  History of bulbar urethral stricture, denies new symptoms at December visit.  Has taken Flomax for urinary symptoms.  Planned follow up with urology this month.   Daytime urinary frequency for past year. No new pain/dysuria/hematuria, no recnet changes. Does not notice as much at night - only up to 2 times per night. HCTZ taken in am.    Patient Active Problem List   Diagnosis Date Noted  . Acute pain of left shoulder 10/10/2018  . Chest pain 11/08/2017  . Memory difficulty 03/24/2016  . Hereditary and idiopathic peripheral neuropathy 03/24/2016  . Abnormality of gait 03/24/2016  . Peripheral neuropathy 04/18/2015  . Hyperlipidemia 04/18/2015  . Prostate cancer ([place]) 08/24/2014  . Right inguinal hernia 10/30/2013   Past Medical History:  Diagnosis Date  . Abnormality of gait 03/24/2016  . Anxiety   . Cancer ([place])   . Cataract   . Depression   . Heart murmur   . Hereditary and idiopathic peripheral neuropathy 03/24/2016  . Hypertension   .  Memory difficulty 03/24/2016  . Neuromuscular disorder ([place])   . Neuropathy   . Prostate cancer ([place])    remission   Past Surgical History:  Procedure Laterality Date  . CATARACT EXTRACTION Bilateral   . EYE SURGERY    . HERNIA REPAIR  1985  . INGUINAL HERNIA REPAIR Right 12/06/2013   Procedure: HERNIA REPAIR INGUINAL ADULT;  Surgeon: Merrie Roof, MD;  Location:  [facility];  Service: General;  Laterality: Right;  . INSERTION OF MESH Right 12/06/2013   Procedure: INSERTION OF MESH;  Surgeon: Merrie Roof, MD;  Location: [place];  Service: General;  Laterality: Right;  . PROSTATE SURGERY     Allergies  Allergen Reactions  . Lyrica [Pregabalin] Other (See Comments)    "constipation"   Prior to Admission medications   Medication Sig Start Date End Date Taking? Authorizing Provider  ammonium lactate (AMLACTIN) 12 % lotion Apply to both feet twice daily for dry skin 05/25/19  Yes Galaway, Stephani Police, DPM  aspirin EC 81 MG tablet Take 81 mg by mouth daily. Reported on 05/14/2016   Yes [provider]  Calcium Carb-Cholecalciferol (CALCIUM 600 + D PO) Take 1 tablet by mouth daily.   Yes [provider]  DULoxetine (CYMBALTA) 30 MG capsule Take 1 capsule (30 mg total) by mouth daily. 05/25/19  Yes Kathrynn Ducking, MD  ERLEADA 60 MG tablet  06/06/18  Yes [provider]  gabapentin (NEURONTIN) 600 MG tablet TAKE 2 TABLET BY MOUTH IN MORNING AND 2 TABLET AT MIDDAY AND 1 TABLET IN EVENING 02/23/19  Yes [name], MD  hydrochlorothiazide (HYDRODIURIL) 25 MG tablet TAKE 1 TABLET BY MOUTH EVERY DAY 04/01/19  Yes [name], MD  Leuprolide Acetate (LUPRON IJ) Inject 1 application as directed every 6 (six) months.   Yes [provider]  RAPAFLO 8 MG CAPS capsule  11/09/17  Yes [provider]  simvastatin (ZOCOR) 40 MG tablet TAKE 1 TABLET BY MOUTH ONCE DAILY 01/30/19  Yes [name], MD  tamsulosin (FLOMAX) 0.4 MG CAPS capsule Take 0.4 mg daily by mouth. Reported on 05/14/2016 08/17/15  Yes [provider]   Social History   Socioeconomic History  . Marital status: Widowed    Spouse name: Not on file  . Number of children: Not on file  . Years of education: Not on file  . Highest education level: Not on file  Occupational History  . Not on file  Social Needs  . Financial resource strain: Not  on file  . Food insecurity:    Worry: Not on file    Inability: Not on file  . Transportation needs:    Medical: Not on file    Non-medical: Not on file  Tobacco Use  . Smoking status: Former Research scientist (life sciences)  . Smokeless tobacco: Never Used  Substance and Sexual Activity  . Alcohol use: No  . Drug use: No  . Sexual activity: Not on file  Lifestyle  . Physical activity:    Days per week: Not on file    Minutes per session: Not on file  . Stress: Not on file  Relationships  . Social connections:    Talks on phone: Not on file    Gets together: Not on file    Attends religious service: Not on file    Active member of club or organization: Not on file    Attends meetings of clubs or organizations: Not on file  Relationship status: Not on file  . Intimate partner violence:    Fear of current or ex partner: Not on file    Emotionally abused: Not on file    Physically abused: Not on file    Forced sexual activity: Not on file  Other Topics Concern  . Not on file  Social History Narrative   Lives home alone.  Retired.  Widowed.  % children.       Observations/Objective: No distress on phone, appropriate responses, understanding of plan expressed with all questions answered.  Assessment and Plan: Essential hypertension - Plan: Comprehensive metabolic panel, amLODipine (NORVASC) 5 MG tablet  -Controlled on last reading with neurology, but hydrochlorothiazide likely culprit of frequent urination.  Stop HCTZ, start Norvasc 5 mg, potential side effects discussed and cautioned on orthostatic symptoms if hypotension.  Recheck in office with in person exam in 2 weeks.  Lab work at that time.  Peripheral polyneuropathy - Plan: gabapentin (NEURONTIN) 600 MG tablet  -Tolerating recent change in Cymbalta, will continue gabapentin same dose.  Follow-up with neurology.  Hyperlipidemia, unspecified hyperlipidemia type - Plan: Comprehensive metabolic panel, Lipid panel  Tolerating statin, plan for  labs next visit in 2 weeks, continue same dose statin for now.  Prostate cancer [facility]) Urinary frequency  -Followed by urology, on Lupron.  Also takes Flomax for lower urinary symptoms, but frequent urination appears to be more during the day, likely related to HCTZ.  Will stop that as above for now monitor for improvement over the next 2 weeks with in office evaluation at that time.  Follow Up Instructions: 2 weeks in office.   Patient Instructions    Continue same dose of gabapentin and higher dose of cymbalta.   STOP hydrochlorothiazide to see if that helps with frequent urination in day.  Start amlodipine 5mg  per day for blood pressure, and follow up in office in next 2 weeks to recheck blood pressure, and check some bloodwork at that time.  If any lightheadedness or dizziness, be seen right away to evaluate your pressure.    If you have lab work done today you will be contacted with your lab results within the next 2 weeks.  If you have not heard from [place] then please contact us. The fastest way to get your results is to register for My Chart.   IF you received an x-ray today, you will receive an invoice from [facility]. Please contact [facility] at [phone] with questions or concerns regarding your invoice.   IF you received labwork today, you will receive an invoice from [place]. Please contact [facility] at [phone] with questions or concerns regarding your invoice.   Our billing staff will not be able to assist you with questions regarding bills from these companies.  You will be contacted with the lab results as soon as they are available. The fastest way to get your results is to activate your My Chart account. Instructions are located on the last page of this paperwork. If you have not heard from [place] regarding the results in 2 weeks, please contact this office.          I discussed the assessment and treatment plan with the patient. The patient  was provided an opportunity to ask questions and all were answered. The patient agreed with the plan and demonstrated an understanding of the instructions.   The patient was advised to call back or seek an in-person evaluation if the symptoms worsen or if the condition fails to  improve as anticipated.  I provided 12 minutes of non-face-to-face time during this encounter.  Signed,   Merri Ray, MD [facility].  05/30/19

## 2019-05-30 NOTE — Progress Notes (Signed)
CC- Medication refills/urine frequence- Need a refill on Gabapentin. Patient stated he is take 5 pills a day of the gabapentin. He was seen by neurology on 05/25/19 and he increased his Cymbalta to 30 mg. Patient also stated he take medication for the frequent urination (Flomax) but he still go a lot and want to know if we can up his medication or take 2 pills twice a day or whatever Dr Carlota Raspberry would recommend.

## 2019-05-31 ENCOUNTER — Other Ambulatory Visit: Payer: Self-pay

## 2019-05-31 ENCOUNTER — Ambulatory Visit (INDEPENDENT_AMBULATORY_CARE_PROVIDER_SITE_OTHER): Payer: Medicare Other | Admitting: Family Medicine

## 2019-05-31 VITALS — BP 126/76 | Ht 74.0 in | Wt 175.0 lb

## 2019-05-31 DIAGNOSIS — Z0000 Encounter for general adult medical examination without abnormal findings: Secondary | ICD-10-CM

## 2019-05-31 NOTE — Patient Instructions (Signed)
Thank you for taking time to come for your Medicare Wellness Visit. I appreciate your ongoing commitment to your health goals. Please review the following plan we discussed and let me know if I can assist you in the future.  Leroy Kennedy LPN  Fall Prevention in the Home, Adult Falls can cause injuries. They can happen to people of all ages. There are many things you can do to make your home safe and to help prevent falls. Ask for help when making these changes, if needed. What actions can I take to prevent falls? General Instructions  Use good lighting in all rooms. Replace any light bulbs that burn out.  Turn on the lights when you go into a dark area. Use night-lights.  Keep items that you use often in easy-to-reach places. Lower the shelves around your home if necessary.  Set up your furniture so you have a clear path. Avoid moving your furniture around.  Do not have throw rugs and other things on the floor that can make you trip.  Avoid walking on wet floors.  If any of your floors are uneven, fix them.  Add color or contrast paint or tape to clearly mark and help you see: ? Any grab bars or handrails. ? First and last steps of stairways. ? Where the edge of each step is.  If you use a stepladder: ? Make sure that it is fully opened. Do not climb a closed stepladder. ? Make sure that both sides of the stepladder are locked into place. ? Ask someone to hold the stepladder for you while you use it.  If there are any pets around you, be aware of where they are. What can I do in the bathroom?      Keep the floor dry. Clean up any water that spills onto the floor as soon as it happens.  Remove soap buildup in the tub or shower regularly.  Use non-skid mats or decals on the floor of the tub or shower.  Attach bath mats securely with double-sided, non-slip rug tape.  If you need to sit down in the shower, use a plastic, non-slip stool.  Install grab bars by the toilet  and in the tub and shower. Do not use towel bars as grab bars. What can I do in the bedroom?  Make sure that you have a light by your bed that is easy to reach.  Do not use any sheets or blankets that are too big for your bed. They should not hang down onto the floor.  Have a firm chair that has side arms. You can use this for support while you get dressed. What can I do in the kitchen?  Clean up any spills right away.  If you need to reach something above you, use a strong step stool that has a grab bar.  Keep electrical cords out of the way.  Do not use floor polish or wax that makes floors slippery. If you must use wax, use non-skid floor wax. What can I do with my stairs?  Do not leave any items on the stairs.  Make sure that you have a light switch at the top of the stairs and the bottom of the stairs. If you do not have them, ask someone to add them for you.  Make sure that there are handrails on both sides of the stairs, and use them. Fix handrails that are broken or loose. Make sure that handrails are as long as the  stairways.  Install non-slip stair treads on all stairs in your home.  Avoid having throw rugs at the top or bottom of the stairs. If you do have throw rugs, attach them to the floor with carpet tape.  Choose a carpet that does not hide the edge of the steps on the stairway.  Check any carpeting to make sure that it is firmly attached to the stairs. Fix any carpet that is loose or worn. What can I do on the outside of my home?  Use bright outdoor lighting.  Regularly fix the edges of walkways and driveways and fix any cracks.  Remove anything that might make you trip as you walk through a door, such as a raised step or threshold.  Trim any bushes or trees on the path to your home.  Regularly check to see if handrails are loose or broken. Make sure that both sides of any steps have handrails.  Install guardrails along the edges of any raised decks and  porches.  Clear walking paths of anything that might make someone trip, such as tools or rocks.  Have any leaves, snow, or ice cleared regularly.  Use sand or salt on walking paths during winter.  Clean up any spills in your garage right away. This includes grease or oil spills. What other actions can I take?  Wear shoes that: ? Have a low heel. Do not wear high heels. ? Have rubber bottoms. ? Are comfortable and fit you well. ? Are closed at the toe. Do not wear open-toe sandals.  Use tools that help you move around (mobility aids) if they are needed. These include: ? Canes. ? Walkers. ? Scooters. ? Crutches.  Review your medicines with your doctor. Some medicines can make you feel dizzy. This can increase your chance of falling. Ask your doctor what other things you can do to help prevent falls. Where to find more information  Centers for Disease Control and Prevention, STEADI: https://garcia.biz/  [employer] on Aging: BrainJudge.co.uk Contact a doctor if:  You are afraid of falling at home.  You feel weak, drowsy, or dizzy at home.  You fall at home. Summary  There are many simple things that you can do to make your home safe and to help prevent falls.  Ways to make your home safe include removing tripping hazards and installing grab bars in the bathroom.  Ask for help when making these changes in your home. This information is not intended to replace advice given to you by your health care provider. Make sure you discuss any questions you have with your health care provider. Document Released: 10/10/2009 Document Revised: 07/29/2017 Document Reviewed: 07/29/2017 Elsevier Interactive Patient Education  2019 [employer].

## 2019-05-31 NOTE — Progress Notes (Signed)
Presents today for [employer] Visit   Date of last exam: 05/30/2019  Interpreter used for this visit? No  I connected with  Casey Fisher on 05/31/19 telephone visit application and verified that I am speaking with the correct person using two identifiers.   I discussed the limitations of evaluation and management by telemedicine. The patient expressed understanding and agreed to proceed.   Patient Care Team: [name], MD as PCP - General (Family Medicine)   Other items to address today:   Discussed fall prevention at home Discussed immunizations Declined TDAP due to insurance Discussed eye/dental exams  Patient expressed interest in meals on wheels advised will research and reach out with information   Other Screening:  Last lipid screening: 07/11/2018  ADVANCE DIRECTIVES: Discussed: yes On File: no Materials Provided: yes (mailed)  Immunization status:  Immunization History  Administered Date(s) Administered  . Influenza, High Dose Seasonal PF 11/21/2018  . Influenza,inj,Quad PF,6+ Mos 12/18/2013, 11/08/2014, 12/04/2015, 11/03/2016, 09/16/2017  . Pneumococcal Conjugate-13 04/18/2015  . Pneumococcal Polysaccharide-23 07/15/2015     Health Maintenance Due  Topic Date Due  . Casey Fisher  02/25/1947     Functional Status Survey: Is the patient deaf or have difficulty hearing?: No Does the patient have difficulty seeing, even when wearing glasses/contacts?: No Does the patient have difficulty concentrating, remembering, or making decisions?: No Does the patient have difficulty walking or climbing stairs?: Yes Does the patient have difficulty dressing or bathing?: No Does the patient have difficulty doing errands alone such as visiting a doctor's office or shopping?: No(patient states he is still running errands for himself)   6CIT Screen 05/31/2019  What Year? 0 points  What month? 0 points  What time? 0 points  Count back  from 20 0 points  Months in reverse 0 points  Repeat phrase 2 points  Total Score 2        Clinical Support from 05/31/2019 in [facility]  AUDIT-C Score  0       Home Environment:    Live alone one story  Daughter lives a [place] Fisher has grocery delivered She also has dinners delivered  He would rather have food for dinners delivered and he mainly makes his breakfast.    Does have a cane and walker sits in a chair with arms now due to falling asleep in chair and falling out of chair onto floor.     Patient Active Problem List   Diagnosis Date Noted  . Acute pain of left shoulder 10/10/2018  . Chest pain 11/08/2017  . Memory difficulty 03/24/2016  . Hereditary and idiopathic peripheral neuropathy 03/24/2016  . Abnormality of gait 03/24/2016  . Peripheral neuropathy 04/18/2015  . Hyperlipidemia 04/18/2015  . Prostate cancer (Casey Fisher) 08/24/2014  . Right inguinal hernia 10/30/2013     Past Medical History:  Diagnosis Date  . Abnormality of gait 03/24/2016  . Anxiety   . Cancer ([place])   . Cataract   . Depression   . Heart murmur   . Hereditary and idiopathic peripheral neuropathy 03/24/2016  . Hypertension   . Memory difficulty 03/24/2016  . Neuromuscular disorder ([place])   . Neuropathy   . Prostate cancer (Casey Fisher)    remission     Past Surgical History:  Procedure Laterality Date  . CATARACT EXTRACTION Bilateral   . EYE SURGERY    . HERNIA REPAIR  1985  . INGUINAL HERNIA REPAIR Right 12/06/2013   Procedure: HERNIA REPAIR INGUINAL  ADULT;  Surgeon: Merrie Roof, MD;  Location: [facility];  Service: General;  Laterality: Right;  . INSERTION OF MESH Right 12/06/2013   Procedure: INSERTION OF MESH;  Surgeon: Merrie Roof, MD;  Location: [facility];  Service: General;  Laterality: Right;  . PROSTATE SURGERY       Family History  Problem Relation Age of Onset  . Brain cancer Daughter   . Heart disease Mother   . Stroke Mother   . Heart disease Father       Social History   Socioeconomic History  . Marital status: Widowed    Spouse name: Not on file  . Number of children: Not on file  . Years of education: Not on file  . Highest education level: Not on file  Occupational History  . Not on file  Social Needs  . Financial resource strain: Not on file  . Food insecurity:    Worry: Not on file    Inability: Not on file  . Transportation needs:    Medical: Not on file    Non-medical: Not on file  Tobacco Use  . Smoking status: Former Research scientist (life sciences)  . Smokeless tobacco: Never Used  Substance and Sexual Activity  . Alcohol use: No  . Drug use: No  . Sexual activity: Not on file  Lifestyle  . Physical activity:    Days per week: Not on file    Minutes per session: Not on file  . Stress: Not on file  Relationships  . Social connections:    Talks on phone: Not on file    Gets together: Not on file    Attends religious service: Not on file    Active member of club or organization: Not on file    Attends meetings of clubs or organizations: Not on file    Relationship status: Not on file  . Intimate partner violence:    Fear of current or ex partner: Not on file    Emotionally abused: Not on file    Physically abused: Not on file    Forced sexual activity: Not on file  Other Topics Concern  . Not on file  Social History Narrative   Lives home alone.  Retired.  Widowed.  % children.       Allergies  Allergen Reactions  . Lyrica [Pregabalin] Other (See Comments)    "constipation"     Prior to Admission medications   Medication Sig Start Date End Date Taking? Authorizing Provider  amLODipine (NORVASC) 5 MG tablet Take 1 tablet (5 mg total) by mouth daily. 05/30/19  Yes [name], MD  ammonium lactate (AMLACTIN) 12 % lotion Apply to both feet twice daily for dry skin 05/25/19  Yes Galaway, Stephani Police, DPM  aspirin EC 81 MG tablet Take 81 mg by mouth daily. Reported on 05/14/2016   Yes [provider]  Calcium  Carb-Cholecalciferol (CALCIUM 600 + D PO) Take 1 tablet by mouth daily.   Yes [provider]  DULoxetine (CYMBALTA) 30 MG capsule Take 1 capsule (30 mg total) by mouth daily. 05/25/19  Yes Kathrynn Ducking, MD  gabapentin (NEURONTIN) 600 MG tablet TAKE 2 TABLET BY MOUTH IN MORNING AND 2 TABLET AT MIDDAY AND 1 TABLET IN EVENING 05/30/19  Yes [name], MD  Leuprolide Acetate (LUPRON IJ) Inject 1 application as directed every 6 (six) months.   Yes [provider]  RAPAFLO 8 MG CAPS capsule  11/09/17  Yes [provider]  simvastatin (ZOCOR) 40 MG tablet TAKE 1 TABLET BY MOUTH ONCE DAILY 01/30/19  Yes [name], MD  tamsulosin (FLOMAX) 0.4 MG CAPS capsule Take 0.4 mg daily by mouth. Reported on 05/14/2016 08/17/15  Yes [provider]  ERLEADA 60 MG tablet  06/06/18   [provider]     Depression screen [facility] 2/9 05/31/2019 05/30/2019 09/06/2018 08/31/2018 07/11/2018  Decreased Interest 0 0 0 0 0  Down, Depressed, Hopeless 0 0 0 0 0  PHQ - 2 Score 0 0 0 0 0     Fall Risk  05/31/2019 05/30/2019 09/06/2018 08/31/2018 07/11/2018  Falls in the past year? 1 0 Yes Yes No  Comment fall out of chair he would fall asleep - - - -  Number falls in past yr: 1 0 2 or more 2 or more -  Comment stumbled over feet - - - -  Injury with Fall? 1 0 Yes Yes -  Risk Factor Category  - - - High Fall Risk -  Risk for fall due to : History of fall(s);Impaired balance/gait - - History of fall(s) -  Follow up Falls evaluation completed;Education provided;Falls prevention discussed - - - -      PHYSICAL EXAM: BP 126/76   Ht 6\' 2"  (1.88 m)   Wt 175 lb (79.4 kg)   BMI 22.47 kg/m    Wt Readings from Last 3 Encounters:  05/31/19 175 lb (79.4 kg)  05/25/19 175 lb (79.4 kg)  01/05/19 167 lb (75.8 kg)     No exam data present    Physical Exam   Education/Counseling provided regarding diet and exercise, prevention of chronic diseases, smoking/tobacco cessation, if  applicable, and reviewed "Covered Medicare Preventive Services."   ASSESSMENT/PLAN: There are no diagnoses linked to this encounter.       Has neuropathy in feet followed by neurology

## 2019-06-03 DIAGNOSIS — Z7189 Other specified counseling: Secondary | ICD-10-CM | POA: Diagnosis not present

## 2019-06-03 DIAGNOSIS — Z20828 Contact with and (suspected) exposure to other viral communicable diseases: Secondary | ICD-10-CM | POA: Diagnosis not present

## 2019-06-14 ENCOUNTER — Other Ambulatory Visit: Payer: Self-pay

## 2019-06-14 ENCOUNTER — Ambulatory Visit (INDEPENDENT_AMBULATORY_CARE_PROVIDER_SITE_OTHER): Payer: Medicare Other | Admitting: Family Medicine

## 2019-06-14 ENCOUNTER — Encounter: Payer: Self-pay | Admitting: Family Medicine

## 2019-06-14 VITALS — BP 138/78 | HR 63 | Temp 98.4°F | Resp 12 | Wt 181.4 lb

## 2019-06-14 DIAGNOSIS — I10 Essential (primary) hypertension: Secondary | ICD-10-CM

## 2019-06-14 DIAGNOSIS — Z1329 Encounter for screening for other suspected endocrine disorder: Secondary | ICD-10-CM

## 2019-06-14 DIAGNOSIS — E785 Hyperlipidemia, unspecified: Secondary | ICD-10-CM | POA: Diagnosis not present

## 2019-06-14 DIAGNOSIS — R609 Edema, unspecified: Secondary | ICD-10-CM | POA: Diagnosis not present

## 2019-06-14 NOTE — Progress Notes (Signed)
Subjective:    Patient ID: Casey Fisher, male    DOB: 1928/02/10, 85 y.o.   MRN: [id number]  HPI Casey Fisher is a 85 y.o. male Presents today for: Chief Complaint  Patient presents with  . Hypertension    Patient is here today to f/u on his blood pressure. He was last seen here on 05/31/19   Hypertension: BP Readings from Last 3 Encounters:  06/14/19 138/78  05/31/19 126/76  05/25/19 140/65  frequent urination discussed last visit  Hx of prostate CA, but nocturia 2-3 only, more daytiime frequency and was taking HCTZ in the morning. Started on norvasc 5mg  qd. Has some noticed some more leg swelling, darkening of skin on lower legs. Denies changing in swelling with use of norvasc.  Hx of peripheral neuropathy pain, no new new leg or calf pain.  Has compression stockings,but not wearing recently.   Lab Results  Component Value Date   CREATININE 0.95 07/11/2018    Wt Readings from Last 3 Encounters:  06/14/19 181 lb 6.4 oz (82.3 kg)  05/31/19 175 lb (79.4 kg)  05/25/19 175 lb (79.4 kg)      Patient Active Problem List   Diagnosis Date Noted  . Acute pain of left shoulder 10/10/2018  . Chest pain 11/08/2017  . Memory difficulty 03/24/2016  . Hereditary and idiopathic peripheral neuropathy 03/24/2016  . Abnormality of gait 03/24/2016  . Peripheral neuropathy 04/18/2015  . Hyperlipidemia 04/18/2015  . Prostate cancer ([place]) 08/24/2014  . Right inguinal hernia 10/30/2013   Past Medical History:  Diagnosis Date  . Abnormality of gait 03/24/2016  . Anxiety   . Cancer ([place])   . Cataract   . Depression   . Heart murmur   . Hereditary and idiopathic peripheral neuropathy 03/24/2016  . Hypertension   . Memory difficulty 03/24/2016  . Neuromuscular disorder ([place])   . Neuropathy   . Prostate cancer ([place])    remission   Past Surgical History:  Procedure Laterality Date  . CATARACT EXTRACTION Bilateral   . EYE SURGERY    . HERNIA REPAIR  1985  . INGUINAL  HERNIA REPAIR Right 12/06/2013   Procedure: HERNIA REPAIR INGUINAL ADULT;  Surgeon: Merrie Roof, MD;  Location: [place];  Service: General;  Laterality: Right;  . INSERTION OF MESH Right 12/06/2013   Procedure: INSERTION OF MESH;  Surgeon: Merrie Roof, MD;  Location: [place];  Service: General;  Laterality: Right;  . PROSTATE SURGERY     Allergies  Allergen Reactions  . Lyrica [Pregabalin] Other (See Comments)    "constipation"   Prior to Admission medications   Medication Sig Start Date End Date Taking? Authorizing Provider  amLODipine (NORVASC) 5 MG tablet Take 1 tablet (5 mg total) by mouth daily. 05/30/19  Yes [name], MD  ammonium lactate (AMLACTIN) 12 % lotion Apply to both feet twice daily for dry skin 05/25/19  Yes [place], Stephani Police, DPM  aspirin EC 81 MG tablet Take 81 mg by mouth daily. Reported on 05/14/2016   Yes [provider]  Calcium Carb-Cholecalciferol (CALCIUM 600 + D PO) Take 1 tablet by mouth daily.   Yes [provider]  DULoxetine (CYMBALTA) 30 MG capsule Take 1 capsule (30 mg total) by mouth daily. 05/25/19  Yes Kathrynn Ducking, MD  ERLEADA 60 MG tablet  06/06/18  Yes [provider]  gabapentin (NEURONTIN) 600 MG tablet TAKE 2 TABLET BY MOUTH IN MORNING AND 2 TABLET AT  MIDDAY AND 1 TABLET IN EVENING 05/30/19  Yes [name], MD  Leuprolide Acetate (LUPRON IJ) Inject 1 application as directed every 6 (six) months.   Yes [provider]  RAPAFLO 8 MG CAPS capsule  11/09/17  Yes [provider]  simvastatin (ZOCOR) 40 MG tablet TAKE 1 TABLET BY MOUTH ONCE DAILY 01/30/19  Yes [name], MD  tamsulosin (FLOMAX) 0.4 MG CAPS capsule Take 0.4 mg daily by mouth. Reported on 05/14/2016 08/17/15  Yes [provider]   Social History   Socioeconomic History  . Marital status: Widowed    Spouse name: Not on file  . Number of children: Not on file  . Years of education: Not on file  . Highest  education level: Not on file  Occupational History  . Not on file  Social Needs  . Financial resource strain: Not on file  . Food insecurity    Worry: Not on file    Inability: Not on file  . Transportation needs    Medical: Not on file    Non-medical: Not on file  Tobacco Use  . Smoking status: Former Research scientist (life sciences)  . Smokeless tobacco: Never Used  Substance and Sexual Activity  . Alcohol use: No  . Drug use: No  . Sexual activity: Not on file  Lifestyle  . Physical activity    Days per week: Not on file    Minutes per session: Not on file  . Stress: Not on file  Relationships  . Social Herbalist on phone: Not on file    Gets together: Not on file    Attends religious service: Not on file    Active member of club or organization: Not on file    Attends meetings of clubs or organizations: Not on file    Relationship status: Not on file  . Intimate partner violence    Fear of current or ex partner: Not on file    Emotionally abused: Not on file    Physically abused: Not on file    Forced sexual activity: Not on file  Other Topics Concern  . Not on file  Social History Narrative   Lives home alone.  Retired.  Widowed.  % children.     Review of Systems  Constitutional: Negative for fatigue and unexpected weight change.  Eyes: Negative for visual disturbance.  Respiratory: Negative for cough, chest tightness and shortness of breath.   Cardiovascular: Negative for chest pain, palpitations and leg swelling.  Gastrointestinal: Negative for abdominal pain and blood in stool.  Neurological: Negative for dizziness, light-headedness and headaches.       Objective:   Physical Exam Vitals signs reviewed.  Constitutional:      Appearance: He is well-developed.  HENT:     Head: Normocephalic and atraumatic.  Eyes:     Pupils: Pupils are equal, round, and reactive to light.  Neck:     Vascular: No carotid bruit or JVD.  Cardiovascular:     Rate and Rhythm: Normal  rate and regular rhythm.     Heart sounds: Normal heart sounds. No murmur.  Pulmonary:     Effort: Pulmonary effort is normal.     Breath sounds: Normal breath sounds. No rales.  Musculoskeletal:     Right lower leg: Edema (1+ bilaterrally to mid tibia. hyperpigmentaion R greater than left lower leg, no wounds. no warmth or erythema noted. ) present.     Left lower leg: Edema present.  Skin:    General: Skin is warm and dry.  Neurological:     Mental Status: He is alert and oriented to person, place, and time.    Vitals:   06/14/19 0934 06/14/19 0957  BP: (!) 148/80 138/78  Pulse: 63   Resp: 12   Temp: 98.4 F (36.9 C)   TempSrc: Oral   SpO2: 96%   Weight: 181 lb 6.4 oz (82.3 kg)           Assessment & Plan:   Casey Fisher is a 85 y.o. male Peripheral edema - Plan: TSH  -Chronic appearing with stasis changes.  No signs of wounds.  Calves are nontender.  Possible component of PVD.  Recommended compression stockings as tolerated, leg elevation, handout given, with RTC precautions if acute worsening.  Check TSH.  Aveeno or other lotion for dry areas as needed  Essential hypertension - Plan: TSH, Comprehensive metabolic panel  -Overall stable on current regimen.  No changes, labs pending  Screening for thyroid disorder - Plan: TSH  Hyperlipidemia, unspecified hyperlipidemia type - Plan: Lipid panel, Comprehensive metabolic panel  -Tolerating simvastatin, check labs.  Continue same  No orders of the defined types were placed in this encounter.  Patient Instructions   Based on the appearance of your legs, I think the swelling has been there for some time.  See information below on peripheral edema.  If you can try to use the compression stockings again that may help.  Okay to apply Aveeno to the skin but if any wounds, redness or any worsened areas be seen right away.  Additionally if you have any calf pain, be seen right away. Recheck in 1 month.  I will check some  lab work today including kidney, liver, cholesterol test and thyroid test.  Blood pressure level looks okay on amlodipine, no changes for now.   Peripheral Edema  Peripheral edema is swelling that is caused by a buildup of fluid. Peripheral edema most often affects the lower legs, ankles, and feet. It can also develop in the arms, hands, and face. The area of the body that has peripheral edema will look swollen. It may also feel heavy or warm. Your clothes may start to feel tight. Pressing on the area may make a temporary dent in your skin. You may not be able to move your arm or leg as much as usual. There are many causes of peripheral edema. It can be a complication of other diseases, such as congestive heart failure, kidney disease, or a problem with your blood circulation. It also can be a side effect of certain medicines. It often happens to women during pregnancy. Sometimes, the cause is not known. Treating the underlying condition is often the only treatment for peripheral edema. Follow these instructions at home: Pay attention to any changes in your symptoms. Take these actions to help with your discomfort:  Raise (elevate) your legs while you are sitting or lying down.  Move around often to prevent stiffness and to lessen swelling. Do not sit or stand for long periods of time.  Wear support stockings as told by your health care provider.  Follow instructions from your health care provider about limiting salt (sodium) in your diet. Sometimes eating less salt can reduce swelling.  Take over-the-counter and prescription medicines only as told by your health care provider. Your health care provider may prescribe medicine to help your body get rid of excess water (diuretic).  Keep all follow-up visits as told  by your health care provider. This is important. Contact a health care provider if:  You have a fever.  Your edema starts suddenly or is getting worse, especially if you are  pregnant or have a medical condition.  You have swelling in only one leg.  You have increased swelling and pain in your legs. Get help right away if:  You develop shortness of breath, especially when you are lying down.  You have pain in your chest or abdomen.  You feel weak.  You faint. This information is not intended to replace advice given to you by your health care provider. Make sure you discuss any questions you have with your health care provider. Document Released: 01/21/2005 Document Revised: 05/18/2016 Document Reviewed: 06/26/2015 Elsevier Interactive Patient Education  [employer].    If you have lab work done today you will be contacted with your lab results within the next 2 weeks.  If you have not heard from [place] then please contact us. The fastest way to get your results is to register for My Chart.   IF you received an x-ray today, you will receive an invoice from [facility]. Please contact [facility] at [phone] with questions or concerns regarding your invoice.   IF you received labwork today, you will receive an invoice from [facility]. Please contact [facility] at [phone] with questions or concerns regarding your invoice.   Our billing staff will not be able to assist you with questions regarding bills from these companies.  You will be contacted with the lab results as soon as they are available. The fastest way to get your results is to activate your My Chart account. Instructions are located on the last page of this paperwork. If you have not heard from [place] regarding the results in 2 weeks, please contact this office.       Signed,   Merri Ray, MD [facility] [place].  06/15/19 12:04 PM

## 2019-06-14 NOTE — Patient Instructions (Addendum)
Based on the appearance of your legs, I think the swelling has been there for some time.  See information below on peripheral edema.  If you can try to use the compression stockings again that may help.  Okay to apply Aveeno to the skin but if any wounds, redness or any worsened areas be seen right away.  Additionally if you have any calf pain, be seen right away. Recheck in 1 month.  I will check some lab work today including kidney, liver, cholesterol test and thyroid test.  Blood pressure level looks okay on amlodipine, no changes for now.   Peripheral Edema  Peripheral edema is swelling that is caused by a buildup of fluid. Peripheral edema most often affects the lower legs, ankles, and feet. It can also develop in the arms, hands, and face. The area of the body that has peripheral edema will look swollen. It may also feel heavy or warm. Your clothes may start to feel tight. Pressing on the area may make a temporary dent in your skin. You may not be able to move your arm or leg as much as usual. There are many causes of peripheral edema. It can be a complication of other diseases, such as congestive heart failure, kidney disease, or a problem with your blood circulation. It also can be a side effect of certain medicines. It often happens to women during pregnancy. Sometimes, the cause is not known. Treating the underlying condition is often the only treatment for peripheral edema. Follow these instructions at home: Pay attention to any changes in your symptoms. Take these actions to help with your discomfort:  Raise (elevate) your legs while you are sitting or lying down.  Move around often to prevent stiffness and to lessen swelling. Do not sit or stand for long periods of time.  Wear support stockings as told by your health care provider.  Follow instructions from your health care provider about limiting salt (sodium) in your diet. Sometimes eating less salt can reduce swelling.  Take  over-the-counter and prescription medicines only as told by your health care provider. Your health care provider may prescribe medicine to help your body get rid of excess water (diuretic).  Keep all follow-up visits as told by your health care provider. This is important. Contact a health care provider if:  You have a fever.  Your edema starts suddenly or is getting worse, especially if you are pregnant or have a medical condition.  You have swelling in only one leg.  You have increased swelling and pain in your legs. Get help right away if:  You develop shortness of breath, especially when you are lying down.  You have pain in your chest or abdomen.  You feel weak.  You faint. This information is not intended to replace advice given to you by your health care provider. Make sure you discuss any questions you have with your health care provider. Document Released: 01/21/2005 Document Revised: 05/18/2016 Document Reviewed: 06/26/2015 Elsevier Interactive Patient Education  [employer].    If you have lab work done today you will be contacted with your lab results within the next 2 weeks.  If you have not heard from [place] then please contact us. The fastest way to get your results is to register for My Chart.   IF you received an x-ray today, you will receive an invoice from [facility]. Please contact [facility] at [phone] with questions or concerns regarding your invoice.   IF you  received labwork today, you will receive an invoice from [facility]. Please contact [facility] at [phone] with questions or concerns regarding your invoice.   Our billing staff will not be able to assist you with questions regarding bills from these companies.  You will be contacted with the lab results as soon as they are available. The fastest way to get your results is to activate your My Chart account. Instructions are located on the last page of this paperwork. If you  have not heard from [place] regarding the results in 2 weeks, please contact this office.

## 2019-06-15 ENCOUNTER — Encounter: Payer: Self-pay | Admitting: Family Medicine

## 2019-06-15 LAB — COMPREHENSIVE METABOLIC PANEL
ALT: 9 IU/L (ref 0–44)
AST: 16 IU/L (ref 0–40)
Albumin/Globulin Ratio: 1.5 (ref 1.2–2.2)
Albumin: 4.4 g/dL (ref 3.6–4.6)
Alkaline Phosphatase: 76 IU/L (ref 39–117)
BUN/Creatinine Ratio: 14 (ref 10–24)
BUN: 13 mg/dL (ref 8–27)
Bilirubin Total: 0.4 mg/dL (ref 0.0–1.2)
CO2: 23 mmol/L (ref 20–29)
Calcium: 9.3 mg/dL (ref 8.6–10.2)
Chloride: 107 mmol/L — ABNORMAL HIGH (ref 96–106)
Creatinine, Ser: 0.96 mg/dL (ref 0.76–1.27)
GFR calc Af Amer: 81 mL/min/{1.73_m2} (ref >59)
GFR calc non Af Amer: 70 mL/min/{1.73_m2} (ref >59)
Globulin, Total: 2.9 g/dL (ref 1.5–4.5)
Glucose: 88 mg/dL (ref 65–99)
Potassium: 3.8 mmol/L (ref 3.5–5.2)
Sodium: 143 mmol/L (ref 134–144)
Total Protein: 7.3 g/dL (ref 6.0–8.5)

## 2019-06-15 LAB — TSH: TSH: 4.57 u[IU]/mL — ABNORMAL HIGH (ref 0.450–4.500)

## 2019-06-15 LAB — LIPID PANEL
Chol/HDL Ratio: 2.8 ratio (ref 0.0–5.0)
Cholesterol, Total: 164 mg/dL (ref 100–199)
HDL: 59 mg/dL (ref >39)
LDL Calculated: 93 mg/dL (ref 0–99)
Triglycerides: 62 mg/dL (ref 0–149)
VLDL Cholesterol Cal: 12 mg/dL (ref 5–40)

## 2019-06-29 ENCOUNTER — Ambulatory Visit (INDEPENDENT_AMBULATORY_CARE_PROVIDER_SITE_OTHER): Payer: Medicare Other | Admitting: Orthopaedic Surgery

## 2019-06-29 ENCOUNTER — Ambulatory Visit (INDEPENDENT_AMBULATORY_CARE_PROVIDER_SITE_OTHER): Payer: Medicare Other

## 2019-06-29 ENCOUNTER — Other Ambulatory Visit: Payer: Self-pay | Admitting: *Deleted

## 2019-06-29 ENCOUNTER — Encounter: Payer: Self-pay | Admitting: Orthopaedic Surgery

## 2019-06-29 ENCOUNTER — Other Ambulatory Visit: Payer: Self-pay

## 2019-06-29 VITALS — BP 159/80 | HR 72 | Resp 18 | Ht 74.0 in | Wt 176.0 lb

## 2019-06-29 DIAGNOSIS — M545 Low back pain, unspecified: Secondary | ICD-10-CM

## 2019-06-29 DIAGNOSIS — G8929 Other chronic pain: Secondary | ICD-10-CM

## 2019-06-29 NOTE — Progress Notes (Signed)
Office Visit Note   Patient: Casey Fisher           Date of Birth: [birth year]           MRN: [id number] Visit Date: 06/29/2019              Requested by: [name], MD [address] [place],  [place] [address] PCP: [name], MD   Assessment & Plan: Visit Diagnoses:  1. Chronic bilateral low back pain without sciatica     Plan: Diffuse degenerative changes throughout the lumbar spine with a degenerative right lumbar scoliosis.  I suspect there is some element of stenosis at either L4-5 or L5-S1.  This may be contributing to the weakness and pain of his both lower extremities.  I think an MRI scan of the lumbar spine would be helpful.  Casey Fisher does have a history of lower extremity neuropathy and being followed through the neurologist.  He is on gabapentin and Cymbalta.  Office visit over 30 minutes 50% of the time in counseling  Follow-Up Instructions: Return After MRI of lumbar spine.   Orders:  Orders Placed This Encounter  Procedures  . XR Lumbar Spine 2-3 Views  . XR Pelvis 1-2 Views   No orders of the defined types were placed in this encounter.     Procedures: No procedures performed   Clinical Data: No additional findings.   Subjective: Chief Complaint  Patient presents with  . Lower Back - Pain   Casey Fisher is a 85 year old male who presents with low back pain, bil hip pain, bil knee pain. The has been in pain for years. He has not had back surgery, no knee surgery and no hip surgery. He has a history of prostate cancer. He is having difficulty walking. He takes Gabapentin for pain which does not help.  Also taking Cymbalta.  Being followed through the neurology service with a history of peripheral neuropathy.  Recently started using a walker because of lower extremity weakness and pain.  HPI  Review of Systems  Constitutional: Positive for fatigue.  HENT: Negative for trouble swallowing.   Eyes: Negative for pain.   Respiratory: Negative for shortness of breath.   Cardiovascular: Positive for leg swelling.  Gastrointestinal: Negative for constipation.  Endocrine: Positive for heat intolerance.  Genitourinary: Negative for difficulty urinating.  Musculoskeletal: Positive for back pain and gait problem.  Skin: Negative for rash.  Allergic/Immunologic: Negative for food allergies.  Neurological: Positive for weakness and numbness.  Hematological: Does not bruise/bleed easily.  Psychiatric/Behavioral: Negative for sleep disturbance.     Objective: Vital Signs: BP (!) 159/80 (BP Location: Right Arm, Patient Position: Sitting, Cuff Size: Normal)   Pulse 72   Resp 18   Ht 6\' 2"  (1.88 m)   Wt 176 lb (79.8 kg)   BMI 22.60 kg/m   Physical Exam Constitutional:      Appearance: He is well-developed.  Eyes:     Pupils: Pupils are equal, round, and reactive to light.  Pulmonary:     Effort: Pulmonary effort is normal.  Skin:    General: Skin is warm and dry.  Neurological:     Mental Status: He is alert and oriented to person, place, and time.  Psychiatric:        Behavior: Behavior normal.     Ortho Exam examined in a wheelchair.  Does have some altered gait pattern related to his lower extremity weakness.  Does have some chronic  venous stasis changes in both legs and some mild pitting edema.  Seems to have some weakness in his foot but difficult to evaluate.  Straight leg raise is positive for posterior thigh pain bilaterally which may be hamstring tightness.  No percussible tenderness of the lumbar spine  Specialty Comments:  No specialty comments available.  Imaging: Xr Lumbar Spine 2-3 Views  Result Date: 06/29/2019 AP lateral lumbar spine were obtained.  There is a 15 degree scoliotic curve to the right.  No evidence of a compression fracture.  Diffuse degenerative disc disease on the lateral film with near collapse of the L5-S1 disc space.  Slight anterior listhesis of L4 on 5.  Anterior  vertebral body osteophytes.  I suspect there may be some stenosis at either L4-5 or L5-S1  Xr Pelvis 1-2 Views  Result Date: 06/29/2019 AP of the pelvis demonstrates the hip joints are well-maintained.  There are some small subchondral cysts in the superior aspect of both femoral heads.  Some sclerosis of both sides of the SI joints.  Prostate seeds are identified .no acute changes.    PMFS History: Patient Active Problem List   Diagnosis Date Noted  . Acute pain of left shoulder 10/10/2018  . Chest pain 11/08/2017  . Memory difficulty 03/24/2016  . Hereditary and idiopathic peripheral neuropathy 03/24/2016  . Abnormality of gait 03/24/2016  . Peripheral neuropathy 04/18/2015  . Hyperlipidemia 04/18/2015  . Prostate cancer ([place]) 08/24/2014  . Right inguinal hernia 10/30/2013   Past Medical History:  Diagnosis Date  . Abnormality of gait 03/24/2016  . Anxiety   . Cancer ([place])   . Cataract   . Depression   . Heart murmur   . Hereditary and idiopathic peripheral neuropathy 03/24/2016  . Hypertension   . Memory difficulty 03/24/2016  . Neuromuscular disorder ([place])   . Neuropathy   . Prostate cancer ([place])    remission    Family History  Problem Relation Age of Onset  . Brain cancer Daughter   . Heart disease Mother   . Stroke Mother   . Heart disease Father     Past Surgical History:  Procedure Laterality Date  . CATARACT EXTRACTION Bilateral   . EYE SURGERY    . HERNIA REPAIR  1985  . INGUINAL HERNIA REPAIR Right 12/06/2013   Procedure: HERNIA REPAIR INGUINAL ADULT;  Surgeon: Merrie Roof, MD;  Location: [place];  Service: General;  Laterality: Right;  . INSERTION OF MESH Right 12/06/2013   Procedure: INSERTION OF MESH;  Surgeon: Merrie Roof, MD;  Location: [place];  Service: General;  Laterality: Right;  . PROSTATE SURGERY     Social History   Occupational History  . Not on file  Tobacco Use  . Smoking status: Former Research scientist (life sciences)  . Smokeless tobacco: Never Used   Substance and Sexual Activity  . Alcohol use: No  . Drug use: No  . Sexual activity: Not on file

## 2019-07-06 ENCOUNTER — Telehealth: Payer: Self-pay | Admitting: Family Medicine

## 2019-07-06 DIAGNOSIS — C61 Malignant neoplasm of prostate: Secondary | ICD-10-CM | POA: Diagnosis not present

## 2019-07-06 DIAGNOSIS — R350 Frequency of micturition: Secondary | ICD-10-CM | POA: Diagnosis not present

## 2019-07-06 DIAGNOSIS — M8580 Other specified disorders of bone density and structure, unspecified site: Secondary | ICD-10-CM | POA: Diagnosis not present

## 2019-07-06 NOTE — Telephone Encounter (Signed)
Copied from [place] [id number]. Topic: General - Other >> Jul 06, 2019  8:50 AM Keene Breath wrote: Reason for CRM: Patient called to request a referral for PT.  CB# [phone].  Please call to verify.

## 2019-07-10 ENCOUNTER — Telehealth: Payer: Self-pay | Admitting: Family Medicine

## 2019-07-10 NOTE — Telephone Encounter (Signed)
Will discuss this PT  referral at his appointment on 07/14/2019 with Dr. Carlota Raspberry.

## 2019-07-10 NOTE — Telephone Encounter (Signed)
Patient daughter is [name] is calling to request a referral for PT to come to home. Please advise [phone]

## 2019-07-14 ENCOUNTER — Ambulatory Visit: Payer: Medicare Other | Admitting: Family Medicine

## 2019-07-17 ENCOUNTER — Other Ambulatory Visit: Payer: Self-pay

## 2019-07-17 ENCOUNTER — Ambulatory Visit (INDEPENDENT_AMBULATORY_CARE_PROVIDER_SITE_OTHER): Payer: Medicare Other | Admitting: Family Medicine

## 2019-07-17 VITALS — BP 148/70 | HR 71 | Temp 99.1°F | Ht 74.0 in | Wt 176.0 lb

## 2019-07-17 DIAGNOSIS — I10 Essential (primary) hypertension: Secondary | ICD-10-CM

## 2019-07-17 DIAGNOSIS — R29898 Other symptoms and signs involving the musculoskeletal system: Secondary | ICD-10-CM

## 2019-07-17 DIAGNOSIS — M5136 Other intervertebral disc degeneration, lumbar region: Secondary | ICD-10-CM

## 2019-07-17 DIAGNOSIS — R609 Edema, unspecified: Secondary | ICD-10-CM

## 2019-07-17 MED ORDER — HYDROCHLOROTHIAZIDE 25 MG PO TABS: 25.0000 mg | ORAL_TABLET | Freq: Every day | ORAL | 1 refills | Status: DC

## 2019-07-17 NOTE — Progress Notes (Signed)
Subjective:    Patient ID: Casey Fisher, male    DOB: [birth year], 85 y.o.   MRN: [id number]  HPI Casey Fisher is a 85 y.o. male Presents today for: Chief Complaint  Patient presents with  . Leg Swelling    right   Right leg swelling: The 17th for peripheral edema, chronic appearing with stasis changes at that time.  Suspect a component of peripheral vascular disease.  No wounds, calves are nontender.  Recommended compression stockings, leg elevation, Aveeno or other lotion to dry areas.  TSH borderline at 4.57.  CMP overall nl.  Has been using compression stockings intermittently.  Easier to put stockings on . Trouble removing. Using every other day. Applying lotion.  Has friend to help get to appointments, dtr helps with groceries - delivered to home.  Lives by self. Unable to drive. Shower chair.last had PT about a year ago - at home. Feels like he has sufficient help at home.  Changed from HCTZ to Norvasc in June due to daytime urinary frequency, no change in symptoms with switch.  Followed by urology for prostate CA, on flomax - still taking.    Back pain, leg weakness: Recently seen July 2 by Dr. Durward Fortes with orthopedics.  Chronic bilateral low back pain.  Diffuse degenerative changes with degenerative right lumbar scoliosis.  Suspected stenosis at L4-5 or L5-S1 that may be contributing to the weakness and pain of both lower extremities.  Plan for MRI.  He is also followed by neurology for neuropathy and takes gabapentin and Cymbalta.  MRI of lumbar spine is scheduled for August 2. See recent telephone message July 13, daughter requested home physical therapy. Has cane/sticks that he uses primarily. Has walker, has been using some more lately.  More difficulty with walking later in the day.   Patient Active Problem List   Diagnosis Date Noted  . Acute pain of left shoulder 10/10/2018  . Chest pain 11/08/2017  . Memory difficulty 03/24/2016  . Hereditary and  idiopathic peripheral neuropathy 03/24/2016  . Abnormality of gait 03/24/2016  . Peripheral neuropathy 04/18/2015  . Hyperlipidemia 04/18/2015  . Prostate cancer ([place]) 08/24/2014  . Right inguinal hernia 10/30/2013   Past Medical History:  Diagnosis Date  . Abnormality of gait 03/24/2016  . Anxiety   . Cancer ([place])   . Cataract   . Depression   . Heart murmur   . Hereditary and idiopathic peripheral neuropathy 03/24/2016  . Hypertension   . Memory difficulty 03/24/2016  . Neuromuscular disorder ([place])   . Neuropathy   . Prostate cancer ([place])    remission   Past Surgical History:  Procedure Laterality Date  . CATARACT EXTRACTION Bilateral   . EYE SURGERY    . HERNIA REPAIR  1985  . INGUINAL HERNIA REPAIR Right 12/06/2013   Procedure: HERNIA REPAIR INGUINAL ADULT;  Surgeon: Merrie Roof, MD;  Location: [place];  Service: General;  Laterality: Right;  . INSERTION OF MESH Right 12/06/2013   Procedure: INSERTION OF MESH;  Surgeon: Merrie Roof, MD;  Location: [place];  Service: General;  Laterality: Right;  . PROSTATE SURGERY     Allergies  Allergen Reactions  . Lyrica [Pregabalin] Other (See Comments)    "constipation"   Prior to Admission medications   Medication Sig Start Date End Date Taking? Authorizing Provider  amLODipine (NORVASC) 5 MG tablet Take 1 tablet (5 mg total) by mouth daily. 05/30/19   [name], MD  ammonium  lactate (AMLACTIN) 12 % lotion Apply to both feet twice daily for dry skin 05/25/19   Marzetta Board, DPM  aspirin EC 81 MG tablet Take 81 mg by mouth daily. Reported on 05/14/2016    [provider]  Calcium Carb-Cholecalciferol (CALCIUM 600 + D PO) Take 1 tablet by mouth daily.    [provider]  DULoxetine (CYMBALTA) 30 MG capsule Take 1 capsule (30 mg total) by mouth daily. 05/25/19   Kathrynn Ducking, MD  ERLEADA 60 MG tablet  06/06/18   [provider]  gabapentin (NEURONTIN) 600 MG tablet TAKE 2 TABLET BY MOUTH  IN MORNING AND 2 TABLET AT MIDDAY AND 1 TABLET IN EVENING 05/30/19   [name], MD  Leuprolide Acetate (LUPRON IJ) Inject 1 application as directed every 6 (six) months.    [provider]  RAPAFLO 8 MG CAPS capsule  11/09/17   [provider]  simvastatin (ZOCOR) 40 MG tablet TAKE 1 TABLET BY MOUTH ONCE DAILY 01/30/19   [name], MD  tamsulosin (FLOMAX) 0.4 MG CAPS capsule Take 0.4 mg daily by mouth. Reported on 05/14/2016 08/17/15   [provider]   Social History   Socioeconomic History  . Marital status: Widowed    Spouse name: Not on file  . Number of children: Not on file  . Years of education: Not on file  . Highest education level: Not on file  Occupational History  . Not on file  Social Needs  . Financial resource strain: Not on file  . Food insecurity    Worry: Not on file    Inability: Not on file  . Transportation needs    Medical: Not on file    Non-medical: Not on file  Tobacco Use  . Smoking status: Former Research scientist (life sciences)  . Smokeless tobacco: Never Used  Substance and Sexual Activity  . Alcohol use: No  . Drug use: No  . Sexual activity: Not on file  Lifestyle  . Physical activity    Days per week: Not on file    Minutes per session: Not on file  . Stress: Not on file  Relationships  . Social Herbalist on phone: Not on file    Gets together: Not on file    Attends religious service: Not on file    Active member of club or organization: Not on file    Attends meetings of clubs or organizations: Not on file    Relationship status: Not on file  . Intimate partner violence    Fear of current or ex partner: Not on file    Emotionally abused: Not on file    Physically abused: Not on file    Forced sexual activity: Not on file  Other Topics Concern  . Not on file  Social History Narrative   Lives home alone.  Retired.  Widowed.  % children.      Review of Systems  Respiratory: Negative for chest tightness and  shortness of breath.   Cardiovascular: Positive for leg swelling (lower legs, sore in ankles, not calves. ). Negative for chest pain.       Objective:   Physical Exam Vitals signs and nursing note reviewed.  Constitutional:      Appearance: He is well-developed.  HENT:     Head: Normocephalic and atraumatic.  Eyes:     Pupils: Pupils are equal, round, and reactive to light.  Neck:     Vascular: No carotid  bruit or JVD.  Cardiovascular:     Rate and Rhythm: Normal rate and regular rhythm.     Heart sounds: Normal heart sounds. No murmur.  Pulmonary:     Effort: Pulmonary effort is normal.     Breath sounds: Normal breath sounds. No rales.  Musculoskeletal:     Right lower leg: Edema (1+ lower 1/3rd bilat, statis change/dark on r>L, no wounds. min dry skin. calves nt/neg [place]. ) present.     Left lower leg: Edema present.  Skin:    General: Skin is warm and dry.     Coloration: Skin is not pale (toes warm, no cyanosis. ).  Neurological:     Mental Status: He is alert and oriented to person, place, and time.    Vitals:   07/17/19 1059 07/17/19 1155  BP: (!) 174/85 (!) 148/70  Pulse: 71   Temp: 99.1 F (37.3 C)   TempSrc: Oral   SpO2: 98%   Weight: 176 lb (79.8 kg)   Height: 6\' 2"  (1.88 m)         Assessment & Plan:

## 2019-07-17 NOTE — Patient Instructions (Signed)
Swelling in ankles does appear to have been there for a while based on the discoloration of the skin in that area.  Unfortunately amlodipine can cause swelling, so I think it is reasonable to switch back to the hydrochlorothiazide fluid pill for your blood pressure.  Stop amlodipine, start hydrochlorothiazide.  If that causes you to have more frequent urination during the day or night, we can look at other options.  If any calf pain or new calf swelling, be seen right away, but unlikely cause of swelling in ankles.  Continue to apply lotion to the dry skin, elevate legs or use compression stockings if possible.   Follow-up as planned with your orthopedist for MRI as that may give some more information on the leg weakness.  I will talk to your daughter and we can coordinate home physical therapy likely.  Follow-up with me in 3 weeks and we can recheck some blood work at that time including the borderline thyroid test from last visit.  Return to the clinic or go to the nearest emergency room if any of your symptoms worsen or new symptoms occur.  Thank you for coming in today and take care.    Peripheral Edema  Peripheral edema is swelling that is caused by a buildup of fluid. Peripheral edema most often affects the lower legs, ankles, and feet. It can also develop in the arms, hands, and face. The area of the body that has peripheral edema will look swollen. It may also feel heavy or warm. Your clothes may start to feel tight. Pressing on the area may make a temporary dent in your skin. You may not be able to move your swollen arm or leg as much as usual. There are many causes of peripheral edema. It can happen because of a complication of other conditions such as congestive heart failure, kidney disease, or a problem with your blood circulation. It also can be a side effect of certain medicines or because of an infection. It often happens to women during pregnancy. Sometimes, the cause is not known.  Follow these instructions at home: Managing pain, stiffness, and swelling   Raise (elevate) your legs while you are sitting or lying down.  Move around often to prevent stiffness and to lessen swelling.  Do not sit or stand for long periods of time.  Wear support stockings as told by your health care provider. Medicines  Take over-the-counter and prescription medicines only as told by your health care provider.  Your health care provider may prescribe medicine to help your body get rid of excess water (diuretic). General instructions  Pay attention to any changes in your symptoms.  Follow instructions from your health care provider about limiting salt (sodium) in your diet. Sometimes, eating less salt may reduce swelling.  Moisturize skin daily to help prevent skin from cracking and draining.  Keep all follow-up visits as told by your health care provider. This is important. Contact a health care provider if you have:  A fever.  Edema that starts suddenly or is getting worse, especially if you are pregnant or have a medical condition.  Swelling in only one leg.  Increased swelling, redness, or pain in one or both of your legs.  Drainage or sores at the area where you have edema. Get help right away if you:  Develop shortness of breath, especially when you are lying down.  Have pain in your chest or abdomen.  Feel weak.  Feel faint. Summary  Peripheral edema is  swelling that is caused by a buildup of fluid. Peripheral edema most often affects the lower legs, ankles, and feet.  Move around often to prevent stiffness and to lessen swelling. Do not sit or stand for long periods of time.  Pay attention to any changes in your symptoms.  Contact a health care provider if you have edema that starts suddenly or is getting worse, especially if you are pregnant or have a medical condition.  Get help right away if you develop shortness of breath, especially when lying  down. This information is not intended to replace advice given to you by your health care provider. Make sure you discuss any questions you have with your health care provider. Document Released: 01/21/2005 Document Revised: 09/07/2018 Document Reviewed: 09/07/2018 Elsevier Patient Education  [employer].     If you have lab work done today you will be contacted with your lab results within the next 2 weeks.  If you have not heard from [place] then please contact us. The fastest way to get your results is to register for My Chart.   IF you received an x-ray today, you will receive an invoice from [facility]. Please contact [facility] at [phone] with questions or concerns regarding your invoice.   IF you received labwork today, you will receive an invoice from [facility]. Please contact [facility] at [phone] with questions or concerns regarding your invoice.   Our billing staff will not be able to assist you with questions regarding bills from these companies.  You will be contacted with the lab results as soon as they are available. The fastest way to get your results is to activate your My Chart account. Instructions are located on the last page of this paperwork. If you have not heard from [place] regarding the results in 2 weeks, please contact this office.

## 2019-07-19 ENCOUNTER — Telehealth: Payer: Self-pay | Admitting: Family Medicine

## 2019-07-19 NOTE — Telephone Encounter (Signed)
Spoke with daughter and she was not aware that hctz was a diuretic. I advised her to get pt to take it earlier in the day than in the evening due to being up all night urinating. She verbalized understanding.

## 2019-07-19 NOTE — Telephone Encounter (Signed)
LVM for daughter to return call. 

## 2019-07-19 NOTE — Telephone Encounter (Signed)
Daughter, [name], calling to request Dr. Nyoka Cowden or his nurse follow up with the patient because ever since Dr. Nyoka Cowden changed how he takes one of his medications, he's been up with very frequent urination. The patient needs a call back to address this new symptom.

## 2019-07-25 ENCOUNTER — Encounter: Payer: Self-pay | Admitting: Orthopaedic Surgery

## 2019-07-25 ENCOUNTER — Ambulatory Visit (INDEPENDENT_AMBULATORY_CARE_PROVIDER_SITE_OTHER): Payer: Medicare Other | Admitting: Orthopaedic Surgery

## 2019-07-25 ENCOUNTER — Other Ambulatory Visit: Payer: Self-pay

## 2019-07-25 VITALS — BP 136/65 | HR 80 | Ht 74.0 in | Wt 176.0 lb

## 2019-07-25 DIAGNOSIS — M5442 Lumbago with sciatica, left side: Secondary | ICD-10-CM | POA: Diagnosis not present

## 2019-07-25 DIAGNOSIS — G8929 Other chronic pain: Secondary | ICD-10-CM

## 2019-07-25 NOTE — Progress Notes (Signed)
   Office Visit Note   Patient: Casey Fisher           Date of Birth: 1928-07-22           MRN: [id number] Visit Date: 07/25/2019              Requested by: [name], MD [address] [place],  [place] [address] PCP: [name], MD   Assessment & Plan: Visit Diagnoses: No diagnosis found.  Plan: .  Follow-Up Instructions: No follow-ups on file.   Orders:  No orders of the defined types were placed in this encounter.  No orders of the defined types were placed in this encounter.     Procedures: No procedures performed   Clinical Data: No additional findings.   Subjective: Chief Complaint  Patient presents with  . Lower Back - Follow-up  Patient presents today for a four week follow up on his lower back. He is scheduled to have his MRI done next week. No changes in his lower back.   HPI  Review of Systems   Objective: Vital Signs: There were no vitals taken for this visit.  Physical Exam  Ortho Exam  Specialty Comments:  No specialty comments available.  Imaging: No results found.   PMFS History: Patient Active Problem List   Diagnosis Date Noted  . Acute pain of left shoulder 10/10/2018  . Chest pain 11/08/2017  . Memory difficulty 03/24/2016  . Hereditary and idiopathic peripheral neuropathy 03/24/2016  . Abnormality of gait 03/24/2016  . Peripheral neuropathy 04/18/2015  . Hyperlipidemia 04/18/2015  . Prostate cancer ([place]) 08/24/2014  . Right inguinal hernia 10/30/2013   Past Medical History:  Diagnosis Date  . Abnormality of gait 03/24/2016  . Anxiety   . Cancer ([place])   . Cataract   . Depression   . Heart murmur   . Hereditary and idiopathic peripheral neuropathy 03/24/2016  . Hypertension   . Memory difficulty 03/24/2016  . Neuromuscular disorder ([place])   . Neuropathy   . Prostate cancer ([place])    remission    Family History  Problem Relation Age of Onset  . Brain cancer Daughter   . Heart disease Mother   .  Stroke Mother   . Heart disease Father     Past Surgical History:  Procedure Laterality Date  . CATARACT EXTRACTION Bilateral   . EYE SURGERY    . HERNIA REPAIR  1985  . INGUINAL HERNIA REPAIR Right 12/06/2013   Procedure: HERNIA REPAIR INGUINAL ADULT;  Surgeon: Merrie Roof, MD;  Location: [place];  Service: General;  Laterality: Right;  . INSERTION OF MESH Right 12/06/2013   Procedure: INSERTION OF MESH;  Surgeon: Merrie Roof, MD;  Location: [place];  Service: General;  Laterality: Right;  . PROSTATE SURGERY     Social History   Occupational History  . Not on file  Tobacco Use  . Smoking status: Former Research scientist (life sciences)  . Smokeless tobacco: Never Used  Substance and Sexual Activity  . Alcohol use: No  . Drug use: No  . Sexual activity: Not on file

## 2019-07-30 ENCOUNTER — Ambulatory Visit
Admission: RE | Admit: 2019-07-30 | Discharge: 2019-07-30 | Disposition: A | Discharge status: Home / Self Care | Payer: Medicare Other | Source: Ambulatory Visit | Attending: Orthopaedic Surgery | Admitting: Orthopaedic Surgery

## 2019-07-30 ENCOUNTER — Other Ambulatory Visit: Payer: Self-pay

## 2019-07-30 DIAGNOSIS — M48061 Spinal stenosis, lumbar region without neurogenic claudication: Secondary | ICD-10-CM | POA: Diagnosis not present

## 2019-07-30 DIAGNOSIS — M545 Low back pain, unspecified: Secondary | ICD-10-CM

## 2019-07-30 DIAGNOSIS — G8929 Other chronic pain: Secondary | ICD-10-CM

## 2019-07-31 ENCOUNTER — Other Ambulatory Visit: Payer: Self-pay | Admitting: Family Medicine

## 2019-07-31 DIAGNOSIS — I10 Essential (primary) hypertension: Secondary | ICD-10-CM

## 2019-08-01 ENCOUNTER — Telehealth: Payer: Self-pay | Admitting: Family Medicine

## 2019-08-01 NOTE — Telephone Encounter (Signed)
Patient is needing physical therapy and hhc per daughter Casey Fisher [phone] daughter  called and states that this is needed asap   The advocate that comes in the office with him does not enforce this   His daughter would like a phone call immediately to discuss this situation

## 2019-08-01 NOTE — Telephone Encounter (Signed)
I have called pt and he stated that he wanted me to speak with his daughter. I have called [name], pts daughter, and we have spoke about a few things.   ~One of the main concerns she has is about getting a Home Health person out to check on her dad for day to day activities for he is living on his own.   ~Another thing that they were requesting was PT for ROM and his hunched up back. Pt is not moving around as much as he used to with his age and without proper ROM he is becoming more and more stiff.   ~Also who would we be able to contact in regards to have a small ramp for 1 step built for the pt for he is staggering and freezing more lately.   They have not had any home health providers before and your recommendations would be very helpful. Please place some orders if you deem appropriate.

## 2019-08-03 ENCOUNTER — Telehealth: Payer: Self-pay | Admitting: Orthopaedic Surgery

## 2019-08-03 ENCOUNTER — Encounter: Payer: Self-pay | Admitting: Orthopaedic Surgery

## 2019-08-03 ENCOUNTER — Ambulatory Visit (INDEPENDENT_AMBULATORY_CARE_PROVIDER_SITE_OTHER): Payer: Medicare Other | Admitting: Orthopaedic Surgery

## 2019-08-03 ENCOUNTER — Other Ambulatory Visit: Payer: Self-pay

## 2019-08-03 ENCOUNTER — Telehealth: Payer: Self-pay | Admitting: Neurology

## 2019-08-03 DIAGNOSIS — G8929 Other chronic pain: Secondary | ICD-10-CM

## 2019-08-03 DIAGNOSIS — M545 Low back pain, unspecified: Secondary | ICD-10-CM | POA: Insufficient documentation

## 2019-08-03 DIAGNOSIS — M5442 Lumbago with sciatica, left side: Secondary | ICD-10-CM

## 2019-08-03 NOTE — Telephone Encounter (Signed)
Received a call from the nurse Lauren at Dr Rudene Anda office with [facility]. The doctor is wanting Dr [name] to look at the patient's recent MRI in epic on 07/30/2019 and review. Dr Arrie Aran is considering a epidual steroid injection for the patient and would like to get Dr [name]' opinion on if he agrees.  The contact number is [phone], Lauren. Advised I would route to Dr [name] and either him or I would call back with his recommendations. She states that she would document a phone note in case she was not there.

## 2019-08-03 NOTE — Telephone Encounter (Signed)
Called [facility] [phone]) and left message with Dr.Willis' nurse. Dr.Whitfield wants Dr.Willis to review MRI of L-Spine done on 07-30-19. He then wants to know if he feels like patient would be okay to have an ESI of his lower back. Nurse is going to forward message to Dr.Willis and will call our office back with his response.

## 2019-08-03 NOTE — Progress Notes (Signed)
Office Visit Note   Patient: Casey Fisher           Date of Birth: Aug 06, 1928           MRN: [id number] Visit Date: 08/03/2019              Requested by: [name], MD [address] [place],  [place] [address] PCP: [name], MD   Assessment & Plan: Visit Diagnoses:  1. Chronic bilateral low back pain with left-sided sciatica     Plan: Mr. Mohl has a peripheral neuropathy being followed through the neurology service.  I ordered an MRI scan of his lumbar spine that demonstrates abnormalities at L1-2 and L2-3 related to degenerative scoliosis greater than 20 degrees.  There is possibility of the left L1-L2 or L3 nerve root irritation.  There is no evidence of any acute injury nor metastatic disease. dDfficult history from Mr. Dobratz but he thinks that he is having more trouble on the left than the right side.  He might be a candidate for an epidural steroid injection but I would like to check with neurology and his primary care physician first.  He sees Dr. Nyoka Cowden on Monday and will check with him and will call Dr. Tobey Grim office.  Spent over 30 minutes 50% of the time in counseling discussing all of the above Follow-Up Instructions: Return Will check with neurology regarding epidural steroid injection.   Orders:  No orders of the defined types were placed in this encounter.  No orders of the defined types were placed in this encounter.     Procedures: No procedures performed   Clinical Data: No additional findings.   Subjective: Chief Complaint  Patient presents with  . Lower Back - Follow-up  Patient presents today for follow up on his lower back pain. He had an MRI on 07/30/2019. No changes since his last visit.   HPI  Review of Systems   Objective: Vital Signs: Ht 6\' 2"  (1.88 m)   Wt 176 lb (79.8 kg)   BMI 22.60 kg/m   Physical Exam Constitutional:      Appearance: He is well-developed.  Eyes:     Pupils: Pupils are equal, round,  and reactive to light.  Pulmonary:     Effort: Pulmonary effort is normal.  Skin:    General: Skin is warm and dry.  Neurological:     Mental Status: He is alert and oriented to person, place, and time.  Psychiatric:        Behavior: Behavior normal.     Ortho Exam examined in the wheelchair.  He certainly seems to have some weakness in both of his legs and does not localized to 1 of the other leg.  He certainly has atrophy based on his chronic neuropathy.  Straight leg raise is negative.  Venous stasis changes in both lower extremities able to dorsiflex both feet and toes as well as plantar flex.  The gentleman that accompanies him says that he is weak enough that he oftentimes has to lift his legs to move them.  No percussible tenderness of his lumbar spine  Specialty Comments:  No specialty comments available.  Imaging: No results found.   PMFS History: Patient Active Problem List   Diagnosis Date Noted  . Low back pain 08/03/2019  . Acute pain of left shoulder 10/10/2018  . Chest pain 11/08/2017  . Memory difficulty 03/24/2016  . Hereditary and idiopathic peripheral neuropathy 03/24/2016  . Abnormality of gait  03/24/2016  . Peripheral neuropathy 04/18/2015  . Hyperlipidemia 04/18/2015  . Prostate cancer ([place]) 08/24/2014  . Right inguinal hernia 10/30/2013   Past Medical History:  Diagnosis Date  . Abnormality of gait 03/24/2016  . Anxiety   . Cancer ([place])   . Cataract   . Depression   . Heart murmur   . Hereditary and idiopathic peripheral neuropathy 03/24/2016  . Hypertension   . Memory difficulty 03/24/2016  . Neuromuscular disorder ([place])   . Neuropathy   . Prostate cancer ([place])    remission    Family History  Problem Relation Age of Onset  . Brain cancer Daughter   . Heart disease Mother   . Stroke Mother   . Heart disease Father     Past Surgical History:  Procedure Laterality Date  . CATARACT EXTRACTION Bilateral   . EYE SURGERY    . HERNIA REPAIR   1985  . INGUINAL HERNIA REPAIR Right 12/06/2013   Procedure: HERNIA REPAIR INGUINAL ADULT;  Surgeon: Merrie Roof, MD;  Location: [place];  Service: General;  Laterality: Right;  . INSERTION OF MESH Right 12/06/2013   Procedure: INSERTION OF MESH;  Surgeon: Merrie Roof, MD;  Location: [place];  Service: General;  Laterality: Right;  . PROSTATE SURGERY     Social History   Occupational History  . Not on file  Tobacco Use  . Smoking status: Former Research scientist (life sciences)  . Smokeless tobacco: Never Used  Substance and Sexual Activity  . Alcohol use: No  . Drug use: No  . Sexual activity: Not on file

## 2019-08-04 ENCOUNTER — Telehealth: Payer: Self-pay

## 2019-08-04 ENCOUNTER — Other Ambulatory Visit: Payer: Self-pay | Admitting: Orthopaedic Surgery

## 2019-08-04 DIAGNOSIS — G8929 Other chronic pain: Secondary | ICD-10-CM

## 2019-08-04 NOTE — Telephone Encounter (Signed)
I called the office of Dr. Durward Fortes, unable to actually talk to a person, I did leave a message, this patient appears to have significant scoliosis and multilevel neuroforaminal stenosis, if the patient is having significant back pain with or without radicular symptoms, an epidural steroid injection may be of some benefit.  If they have any further questions, they are to call me back.

## 2019-08-04 NOTE — Telephone Encounter (Signed)
Schedule ESI-thanks for making the call

## 2019-08-04 NOTE — Telephone Encounter (Signed)
Called and left message for patient that someone will be calling to get him scheduled for the [facility].

## 2019-08-04 NOTE — Telephone Encounter (Signed)
Dr. [name] left vm to return call about a Dr. Durward Fortes pt on the triage line. States that he reviewed the pt's MRI and does feel that if he is having pain and radicular symptoms that an ESI would be appropriate. To call his office with any additional questions.

## 2019-08-04 NOTE — Telephone Encounter (Signed)
Great - thanks

## 2019-08-04 NOTE — Telephone Encounter (Signed)
Please see below....response from Dr.Willis.

## 2019-08-07 ENCOUNTER — Ambulatory Visit (INDEPENDENT_AMBULATORY_CARE_PROVIDER_SITE_OTHER): Payer: Medicare Other | Admitting: Family Medicine

## 2019-08-07 ENCOUNTER — Other Ambulatory Visit: Payer: Self-pay

## 2019-08-07 VITALS — BP 118/65 | HR 61 | Temp 98.2°F | Resp 14

## 2019-08-07 DIAGNOSIS — M5136 Other intervertebral disc degeneration, lumbar region: Secondary | ICD-10-CM | POA: Diagnosis not present

## 2019-08-07 DIAGNOSIS — R5381 Other malaise: Secondary | ICD-10-CM

## 2019-08-07 DIAGNOSIS — G629 Polyneuropathy, unspecified: Secondary | ICD-10-CM

## 2019-08-07 DIAGNOSIS — R609 Edema, unspecified: Secondary | ICD-10-CM

## 2019-08-07 DIAGNOSIS — R7989 Other specified abnormal findings of blood chemistry: Secondary | ICD-10-CM | POA: Diagnosis not present

## 2019-08-07 DIAGNOSIS — R29898 Other symptoms and signs involving the musculoskeletal system: Secondary | ICD-10-CM

## 2019-08-07 NOTE — Progress Notes (Signed)
Subjective:    Patient ID: Casey Fisher, male    DOB: 10-24-1928, 85 y.o.   MRN: [id number]  HPI Casey Fisher is a 85 y.o. male Presents today for: Chief Complaint  Patient presents with  . Follow-up    here on 07/17/19 for edema, Son do not think the swelling is any better  . home health srevices    Son also would like to get help with father. Need home health service, scat transportation service and In home care as well   Here today with friend from Casey Fisher.  Seen in w/c today.   Peripheral edema: Evaluated July 20.  Some stasis changes with edema, suspected chronic component.  Stopped amlodipine and switch to HCTZ to see if that would help.  Lotion to dry skin's and leg elevation with compression stockings also discussed. Of note he is on gabapentin 1200 mg in the morning, 1200 mg midday, 600 mg in the evening for peripheral neuropathy, followed by Dr. [name] with [facility] neurology.    Feels like swelling the same. Not able to use compression stockings routinely- needs some to take them off. Used 4-5 times.   Changed from norvasc to HCTZ denies new urinary or other side effects.    Leg weakness: Discussed last visit.As contributed to gait disorder.  He was using a cane and a walker at that time.  Previous fall 8 months ago.  Gait disorder treated previously by neurology along with peripheral neuropathy as above.  Has been on gabapentin and Cymbalta.  Cymbalta increased to 30 mg twice per day in May.  Office visit with orthopedics 4 days ago.  MRI had been ordered indicating abnormalities at L1-2 and L2-3 related to degenerative scoliosis greater than 20 degrees.  Also possibility of L1-2 or L3 nerve root irritation on the left.  There is no evidence of acute injury or metastatic disease (treated with Lupron for prostate cancer).   plan for possible epidural steroid injection, note reviewed from his neurologist also indicating this would be okay to  try. Trouble straightening legs when lying down.    Plan for PT eval as well as evaluation for other assistance at home as he is still living by himself. Requesting meals on wheels, in home care services, rehab services, and SCAT for doctor's visits.  Friend across street - Casey Fisher [phone]), checks on him at times,  [facility] friend Casey Fisher [phone]) provides breakfast each day.  Casey Fisher - dtr, lives in [place]. [phone]).   Abnormal TSH: Lab Results  Component Value Date   TSH 4.570 (H) 06/14/2019  Borderline in June.  Not on meds.    Patient Active Problem List   Diagnosis Date Noted  . Low back pain 08/03/2019  . Acute pain of left shoulder 10/10/2018  . Chest pain 11/08/2017  . Memory difficulty 03/24/2016  . Hereditary and idiopathic peripheral neuropathy 03/24/2016  . Abnormality of gait 03/24/2016  . Peripheral neuropathy 04/18/2015  . Hyperlipidemia 04/18/2015  . Prostate cancer ([place]) 08/24/2014  . Right inguinal hernia 10/30/2013   Past Medical History:  Diagnosis Date  . Abnormality of gait 03/24/2016  . Anxiety   . Cancer ([place])   . Cataract   . Depression   . Heart murmur   . Hereditary and idiopathic peripheral neuropathy 03/24/2016  . Hypertension   . Memory difficulty 03/24/2016  . Neuromuscular disorder ([place])   . Neuropathy   . Prostate cancer ([place])    remission   Past  Surgical History:  Procedure Laterality Date  . CATARACT EXTRACTION Bilateral   . EYE SURGERY    . HERNIA REPAIR  1985  . INGUINAL HERNIA REPAIR Right 12/06/2013   Procedure: HERNIA REPAIR INGUINAL ADULT;  Surgeon: Merrie Roof, MD;  Location: [place];  Service: General;  Laterality: Right;  . INSERTION OF MESH Right 12/06/2013   Procedure: INSERTION OF MESH;  Surgeon: Merrie Roof, MD;  Location: [place];  Service: General;  Laterality: Right;  . PROSTATE SURGERY     Allergies  Allergen Reactions  . Lyrica [Pregabalin] Other (See Comments)     "constipation"   Prior to Admission medications   Medication Sig Start Date End Date Taking? Authorizing Provider  amLODipine (NORVASC) 5 MG tablet TAKE 1 TABLET BY MOUTH EVERY DAY 07/31/19  Yes [name], MD  ammonium lactate (AMLACTIN) 12 % lotion Apply to both feet twice daily for dry skin 05/25/19  Yes [place], Stephani Police, DPM  aspirin EC 81 MG tablet Take 81 mg by mouth daily. Reported on 05/14/2016   Yes [provider]  Calcium Carb-Cholecalciferol (CALCIUM 600 + D PO) Take 1 tablet by mouth daily.   Yes [provider]  DULoxetine (CYMBALTA) 30 MG capsule Take 1 capsule (30 mg total) by mouth daily. 05/25/19  Yes Kathrynn Ducking, MD  ERLEADA 60 MG tablet  06/06/18  Yes [provider]  gabapentin (NEURONTIN) 600 MG tablet TAKE 2 TABLET BY MOUTH IN MORNING AND 2 TABLET AT MIDDAY AND 1 TABLET IN EVENING 05/30/19  Yes [name], MD  hydrochlorothiazide (HYDRODIURIL) 25 MG tablet Take 1 tablet (25 mg total) by mouth daily. 07/17/19  Yes [name], MD  Leuprolide Acetate (LUPRON IJ) Inject 1 application as directed every 6 (six) months.   Yes [provider]  RAPAFLO 8 MG CAPS capsule  11/09/17  Yes [provider]  simvastatin (ZOCOR) 40 MG tablet TAKE 1 TABLET BY MOUTH ONCE DAILY 01/30/19  Yes [name], MD  tamsulosin (FLOMAX) 0.4 MG CAPS capsule Take 0.4 mg daily by mouth. Reported on 05/14/2016 08/17/15  Yes [provider]   Social History   Socioeconomic History  . Marital status: Widowed    Spouse name: Not on file  . Number of children: Not on file  . Years of education: Not on file  . Highest education level: Not on file  Occupational History  . Not on file  Social Needs  . Financial resource strain: Not on file  . Food insecurity    Worry: Not on file    Inability: Not on file  . Transportation needs    Medical: Not on file    Non-medical: Not on file  Tobacco Use  . Smoking status: Former  Research scientist (life sciences)  . Smokeless tobacco: Never Used  Substance and Sexual Activity  . Alcohol use: No  . Drug use: No  . Sexual activity: Not on file  Lifestyle  . Physical activity    Days per week: Not on file    Minutes per session: Not on file  . Stress: Not on file  Relationships  . Social Herbalist on phone: Not on file    Gets together: Not on file    Attends religious service: Not on file    Active member of club or organization: Not on file    Attends meetings of clubs or organizations: Not on file    Relationship status:  Not on file  . Intimate partner violence    Fear of current or ex partner: Not on file    Emotionally abused: Not on file    Physically abused: Not on file    Forced sexual activity: Not on file  Other Topics Concern  . Not on file  Social History Narrative   Lives home alone.  Retired.  Widowed.  % children.      Review of Systems  Eyes: Negative for visual disturbance.  Respiratory: Negative for cough, chest tightness and shortness of breath.   Cardiovascular: Negative for chest pain, palpitations and leg swelling.  Neurological: Negative for dizziness, light-headedness and headaches.       Objective:   Physical Exam Vitals signs reviewed.  Constitutional:      Appearance: He is well-developed.  HENT:     Head: Normocephalic and atraumatic.  Eyes:     Pupils: Pupils are equal, round, and reactive to light.  Neck:     Vascular: No carotid bruit or JVD.  Cardiovascular:     Rate and Rhythm: Normal rate and regular rhythm.     Heart sounds: Normal heart sounds. No murmur.  Pulmonary:     Effort: Pulmonary effort is normal.     Breath sounds: Normal breath sounds. No rales.  Skin:    General: Skin is warm and dry.  Neurological:     Mental Status: He is alert and oriented to person, place, and time.    Vitals:   08/07/19 0948  BP: 118/65  Pulse: 61  Resp: 14  Temp: 98.2 F (36.8 C)  TempSrc: Oral  SpO2: 97%         Assessment & Plan:  CATRELL MORRONE is a 85 y.o. male Weakness of left leg - Plan: Ambulatory referral to Home Health Peripheral polyneuropathy - Plan: Ambulatory referral to [place] DDD (degenerative disc disease), lumbar - Plan: Ambulatory referral to Home Health Physical deconditioning - Plan: Ambulatory referral to [place] multiple contributing comorbidities with leg weakness, possibly from degenerative disc disease and potential plan for epidural spinal injection.  Under care of neurology as well for peripheral neuropathy as well as memory changes.  Suspect element of deconditioning with above conditions.  Does report some difficulty with food availability, but has a good support structure in place with friends as above. -Social work referral ordered  -SCAT transportation system paperwork obtained, he will fill out his portion I will complete mine  -He is already on waiting list for Meals on [place] health physical therapy evaluation ordered.  -Depending on how above proceeds, can look at other care needs.   elevated TSH - Plan: TSH + free T4  -Repeat testing, on the borderline previously.  Peripheral edema  -Appears improved, suspect the amlodipine was contributing to some peripheral edema.  Has compression stockings if needed but minimal symptoms at present.  RTC precautions if worsening.  No orders of the defined types were placed in this encounter.  Patient Instructions   Hopefully treatment from orthopedics should help with back pain and potentially leg weakness, but I will refer you for home health to evaluate for physical therapy as well as social worker to evaluate for other needs.  We will try to get you signed up for Meals on Wheels as well as [place] bus information.  Swelling looks much better today, so amlodipine may have been the issue.  Still okay to use compression stockings if you notice more swelling  in the legs and elevate when seated if  possible.  Thyroid test will be rechecked today.  Follow-up in the next 2 months to determine if other assistance needed at home.  Please let me know if there are questions prior to that time.  If you have lab work done today you will be contacted with your lab results within the next 2 weeks.  If you have not heard from [place] then please contact us. The fastest way to get your results is to register for My Chart.   IF you received an x-ray today, you will receive an invoice from [facility]. Please contact [facility] at [phone] with questions or concerns regarding your invoice.   IF you received labwork today, you will receive an invoice from [facility]. Please contact [facility] at [phone] with questions or concerns regarding your invoice.   Our billing staff will not be able to assist you with questions regarding bills from these companies.  You will be contacted with the lab results as soon as they are available. The fastest way to get your results is to activate your My Chart account. Instructions are located on the last page of this paperwork. If you have not heard from [place] regarding the results in 2 weeks, please contact this office.

## 2019-08-07 NOTE — Patient Instructions (Addendum)
Hopefully treatment from orthopedics should help with back pain and potentially leg weakness, but I will refer you for home health to evaluate for physical therapy as well as social worker to evaluate for other needs.  We will try to get you signed up for Meals on Wheels as well as [place] bus information.  Swelling looks much better today, so amlodipine may have been the issue.  Still okay to use compression stockings if you notice more swelling in the legs and elevate when seated if possible.  Thyroid test will be rechecked today.  Follow-up in the next 2 months to determine if other assistance needed at home.  Please let me know if there are questions prior to that time.  If you have lab work done today you will be contacted with your lab results within the next 2 weeks.  If you have not heard from [place] then please contact us. The fastest way to get your results is to register for My Chart.   IF you received an x-ray today, you will receive an invoice from [facility]. Please contact [facility] at [phone] with questions or concerns regarding your invoice.   IF you received labwork today, you will receive an invoice from [facility]. Please contact [facility] at [phone] with questions or concerns regarding your invoice.   Our billing staff will not be able to assist you with questions regarding bills from these companies.  You will be contacted with the lab results as soon as they are available. The fastest way to get your results is to activate your My Chart account. Instructions are located on the last page of this paperwork. If you have not heard from [place] regarding the results in 2 weeks, please contact this office.

## 2019-08-08 ENCOUNTER — Other Ambulatory Visit: Payer: Self-pay

## 2019-08-08 ENCOUNTER — Encounter: Payer: Self-pay | Admitting: Family Medicine

## 2019-08-08 LAB — TSH+FREE T4
Free T4: 0.9 ng/dL (ref 0.82–1.77)
TSH: 4.02 u[IU]/mL (ref 0.450–4.500)

## 2019-08-08 NOTE — Patient Outreach (Signed)
[facility] [facility]) Care Management  08/08/2019  Casey Fisher 20-Oct-1928 [id number]   Referral Date: 08/08/2019 Referral Source: MD referral Referral Reason: Evaluation of home needs(Home health PT ordered, paperwork complete for SCAT, Waiting list for meals on wheels).    Outreach Attempt: Spoke with patient who is able to verify HIPAA. Advised on reason for referral. Patient states that he needs exercise.  Discussed PT home health referral MD ordered on yesterday.  Patient states that no one has called yet.  Advised patient that they should be calling to set up an appointment in the next few days.  He verbalized understanding. Patient states he needs some alternate transportation needs as his friend/caregiver sometimes may not be able to take him to appointments.  He states that SCAT paperwork complete but has not heard anything.  He also mentioned being on the list for meals on wheels but not sure where.     Social: Patient lives alone with support caregiver friend locally.  He states he has one daughter that lives in [place] but makes sure he has what he needs and has things delivered to him.    Conditions: Patient admits to high blood pressure and takes medications as prescribed.  Patient also has DDD for which it causes him some discomfort.  He denies pain presently but states that when he has pain he knows it is their but still functions independently.  Patient considering cortisone injection.  He does not use a cane, walker, or wheelchair and denies any falls.    Medications: Patient takes medications as prescribed and denies any problems affording.   Appointments:  Patient saw PCP yesterday.     Advanced Directives: Patient does not have an advanced directive.     Consent:  Discussed [facility] services.  Patient is agreeable to social work services for transportation and food resources.  Patient also agreeable to nurse phone calls as well.     Plan:RN CM will refer  patient to social work for help with transportation(SCAT) and meals on wheels.   RN CM will send barriers letter and assessment to physician.  RN CM will send [facility] welcome packet to patient. RN CM will outreach patient next week for follow up.    Jone Baseman, RN, MSN [facility] Care Management Care Management Coordinator Direct Line [phone] Toll Free: [phone]  Fax: [phone]

## 2019-08-08 NOTE — Telephone Encounter (Signed)
Saw in office yesterday.  SW eval ordered to deteemine home needs and next course of action. HH PT ordered, on wait list for meals on wheels and SCAT bus ppwk started. Left message for [name] - will try to reach again.

## 2019-08-08 NOTE — Telephone Encounter (Signed)
Left message for [name] at the [phone] number.  See information from office visit yesterday with Ezana, but can still discuss further plans with her if she would like.  Advised to let me know best time to reach her.

## 2019-08-09 NOTE — Progress Notes (Signed)
Spoke with pt, verbalized understanding. Also called daughter [name]. Per note scat app is being completed, meals on wheels is pending approval and referral for home health has been sent. Routing message to Kenefic K to follow up on apps for pt care    Note from [place] transportation system paperwork obtained, he will fill out his portion I will complete mine             -He is already on waiting list for Meals on [place] health physical therapy evaluation ordered.             -Depending on how above proceeds, can look at other care needs.

## 2019-08-10 ENCOUNTER — Other Ambulatory Visit: Payer: Self-pay

## 2019-08-10 DIAGNOSIS — M4186 Other forms of scoliosis, lumbar region: Secondary | ICD-10-CM | POA: Diagnosis not present

## 2019-08-10 DIAGNOSIS — R600 Localized edema: Secondary | ICD-10-CM | POA: Diagnosis not present

## 2019-08-10 DIAGNOSIS — M25512 Pain in left shoulder: Secondary | ICD-10-CM | POA: Diagnosis not present

## 2019-08-10 DIAGNOSIS — M5136 Other intervertebral disc degeneration, lumbar region: Secondary | ICD-10-CM | POA: Diagnosis not present

## 2019-08-10 DIAGNOSIS — F329 Major depressive disorder, single episode, unspecified: Secondary | ICD-10-CM | POA: Diagnosis not present

## 2019-08-10 DIAGNOSIS — E785 Hyperlipidemia, unspecified: Secondary | ICD-10-CM | POA: Diagnosis not present

## 2019-08-10 DIAGNOSIS — G609 Hereditary and idiopathic neuropathy, unspecified: Secondary | ICD-10-CM | POA: Diagnosis not present

## 2019-08-10 DIAGNOSIS — Z9181 History of falling: Secondary | ICD-10-CM | POA: Diagnosis not present

## 2019-08-10 DIAGNOSIS — Z9841 Cataract extraction status, right eye: Secondary | ICD-10-CM | POA: Diagnosis not present

## 2019-08-10 DIAGNOSIS — Z87891 Personal history of nicotine dependence: Secondary | ICD-10-CM | POA: Diagnosis not present

## 2019-08-10 DIAGNOSIS — G709 Myoneural disorder, unspecified: Secondary | ICD-10-CM | POA: Diagnosis not present

## 2019-08-10 DIAGNOSIS — C61 Malignant neoplasm of prostate: Secondary | ICD-10-CM | POA: Diagnosis not present

## 2019-08-10 DIAGNOSIS — Z79818 Long term (current) use of other agents affecting estrogen receptors and estrogen levels: Secondary | ICD-10-CM | POA: Diagnosis not present

## 2019-08-10 DIAGNOSIS — Z7982 Long term (current) use of aspirin: Secondary | ICD-10-CM | POA: Diagnosis not present

## 2019-08-10 DIAGNOSIS — Z9842 Cataract extraction status, left eye: Secondary | ICD-10-CM | POA: Diagnosis not present

## 2019-08-10 DIAGNOSIS — R413 Other amnesia: Secondary | ICD-10-CM | POA: Diagnosis not present

## 2019-08-10 DIAGNOSIS — F419 Anxiety disorder, unspecified: Secondary | ICD-10-CM | POA: Diagnosis not present

## 2019-08-10 DIAGNOSIS — I10 Essential (primary) hypertension: Secondary | ICD-10-CM | POA: Diagnosis not present

## 2019-08-10 NOTE — Patient Outreach (Signed)
[facility]) Care Management  08/10/2019  Casey Fisher July 16, 1928 [id number]   Social work referral received from [facility], Jon Billings, to assist patient with SCAT and Meals on Wheels.  Per note from [place] on 08/08/19 patient reported that he has applied for both services but is unsure of status.  Communicated with Courtney from SCAT eligibility and an application has not been submitted for him. Patient was added to Meals on Wheels wait list on 07/26/19. Successful outreach to patient today.  Informed him of the above information.  SCAT application was completed via phone and submitted to eligibility.   Will follow up with patient within the next three weeks regarding determination for services.  [name], BSW Social Worker [phone]

## 2019-08-11 ENCOUNTER — Telehealth: Payer: Self-pay | Admitting: Emergency Medicine

## 2019-08-11 NOTE — Telephone Encounter (Signed)
SCAT bus form has been mailed to patient. Unable to reach patient by phone

## 2019-08-11 NOTE — Telephone Encounter (Signed)
Pt aware SCAT form mailed to home.

## 2019-08-15 DIAGNOSIS — I10 Essential (primary) hypertension: Secondary | ICD-10-CM | POA: Diagnosis not present

## 2019-08-15 DIAGNOSIS — C61 Malignant neoplasm of prostate: Secondary | ICD-10-CM | POA: Diagnosis not present

## 2019-08-15 DIAGNOSIS — F329 Major depressive disorder, single episode, unspecified: Secondary | ICD-10-CM | POA: Diagnosis not present

## 2019-08-15 DIAGNOSIS — G609 Hereditary and idiopathic neuropathy, unspecified: Secondary | ICD-10-CM | POA: Diagnosis not present

## 2019-08-15 DIAGNOSIS — M25512 Pain in left shoulder: Secondary | ICD-10-CM | POA: Diagnosis not present

## 2019-08-15 DIAGNOSIS — M5136 Other intervertebral disc degeneration, lumbar region: Secondary | ICD-10-CM | POA: Diagnosis not present

## 2019-08-17 ENCOUNTER — Other Ambulatory Visit: Payer: Self-pay

## 2019-08-17 ENCOUNTER — Other Ambulatory Visit: Payer: Self-pay | Admitting: Family Medicine

## 2019-08-17 DIAGNOSIS — E785 Hyperlipidemia, unspecified: Secondary | ICD-10-CM

## 2019-08-17 NOTE — Patient Outreach (Signed)
[facility]) Care Management  08/17/2019  Casey Fisher 1928-01-15 [id number]   Telephone call to patient for check in. Patient reports that he is doing ok.  He states that PT has started and coming 2/week.  Patient scheduled for back injection.  Patient continues to watch is diet and salt intake. Discussed diet and exercise as a way to control blood pressure.  He verbalized understanding. Patient concerned about transportation.  Advised him that his SCAT application has been submitted and that [place] will follow up with him.  He verbalized understanding.  Patient denies any further questions or concerns.   Plan: RN CM will contact patient in the month of September and patient agreeable.   Jone Baseman, RN, MSN [place] Management Care Management Coordinator Direct Line [phone] Cell [phone] Toll Free: [phone]  Fax: [phone]

## 2019-08-18 DIAGNOSIS — G609 Hereditary and idiopathic neuropathy, unspecified: Secondary | ICD-10-CM | POA: Diagnosis not present

## 2019-08-18 DIAGNOSIS — M25512 Pain in left shoulder: Secondary | ICD-10-CM | POA: Diagnosis not present

## 2019-08-18 DIAGNOSIS — I10 Essential (primary) hypertension: Secondary | ICD-10-CM | POA: Diagnosis not present

## 2019-08-18 DIAGNOSIS — M5136 Other intervertebral disc degeneration, lumbar region: Secondary | ICD-10-CM | POA: Diagnosis not present

## 2019-08-18 DIAGNOSIS — F329 Major depressive disorder, single episode, unspecified: Secondary | ICD-10-CM | POA: Diagnosis not present

## 2019-08-18 DIAGNOSIS — C61 Malignant neoplasm of prostate: Secondary | ICD-10-CM | POA: Diagnosis not present

## 2019-08-21 ENCOUNTER — Encounter: Payer: Self-pay | Admitting: Physical Medicine and Rehabilitation

## 2019-08-21 ENCOUNTER — Ambulatory Visit (INDEPENDENT_AMBULATORY_CARE_PROVIDER_SITE_OTHER): Payer: Medicare Other | Admitting: Physical Medicine and Rehabilitation

## 2019-08-21 ENCOUNTER — Ambulatory Visit: Payer: Self-pay

## 2019-08-21 VITALS — BP 155/82 | HR 58

## 2019-08-21 DIAGNOSIS — M48062 Spinal stenosis, lumbar region with neurogenic claudication: Secondary | ICD-10-CM | POA: Diagnosis not present

## 2019-08-21 MED ORDER — METHYLPREDNISOLONE ACETATE 80 MG/ML IJ SUSP
80.0000 mg | Freq: Once | INTRAMUSCULAR | Status: AC
  Administered 2019-08-21: 80 mg

## 2019-08-21 NOTE — Progress Notes (Signed)
 .  Numeric Pain Rating Scale and Functional Assessment Average Pain 5   In the last MONTH (on 0-10 scale) has pain interfered with the following?  1. General activity like being  able to carry out your everyday physical activities such as walking, climbing stairs, carrying groceries, or moving a chair?  Rating(6)   +Driver, -BT, -Dye Allergies.  

## 2019-08-22 DIAGNOSIS — I10 Essential (primary) hypertension: Secondary | ICD-10-CM | POA: Diagnosis not present

## 2019-08-22 DIAGNOSIS — G609 Hereditary and idiopathic neuropathy, unspecified: Secondary | ICD-10-CM | POA: Diagnosis not present

## 2019-08-22 DIAGNOSIS — C61 Malignant neoplasm of prostate: Secondary | ICD-10-CM | POA: Diagnosis not present

## 2019-08-22 DIAGNOSIS — M25512 Pain in left shoulder: Secondary | ICD-10-CM | POA: Diagnosis not present

## 2019-08-22 DIAGNOSIS — F329 Major depressive disorder, single episode, unspecified: Secondary | ICD-10-CM | POA: Diagnosis not present

## 2019-08-22 DIAGNOSIS — M5136 Other intervertebral disc degeneration, lumbar region: Secondary | ICD-10-CM | POA: Diagnosis not present

## 2019-08-22 NOTE — Procedures (Signed)
Lumbar Epidural Steroid Injection - Interlaminar Approach with Fluoroscopic Guidance  Patient: Casey Fisher      Date of Birth: Aug 26, 1928 MRN: [id number] PCP: [name], MD      Visit Date: 08/21/2019   Universal Protocol:     Consent Given By: the patient  Position: PRONE  Additional Comments: Vital signs were monitored before and after the procedure. Patient was prepped and draped in the usual sterile fashion. The correct patient, procedure, and site was verified.   Injection Procedure Details:  Procedure Site One Meds Administered:  Meds ordered this encounter  Medications  . methylPREDNISolone acetate (DEPO-MEDROL) injection 80 mg     Laterality: Right  Location/Site:  L2-L3  Needle size: 20 G  Needle type: Tuohy  Needle Placement: Paramedian epidural  Findings:   -Comments: Excellent flow of contrast into the epidural space.  Procedure Details: Using a paramedian approach from the side mentioned above, the region overlying the inferior lamina was localized under fluoroscopic visualization and the soft tissues overlying this structure were infiltrated with 4 ml. of 1% Lidocaine without Epinephrine. The Tuohy needle was inserted into the epidural space using a paramedian approach.   The epidural space was localized using loss of resistance along with lateral and bi-planar fluoroscopic views.  After negative aspirate for air, blood, and CSF, a 2 ml. volume of Isovue-250 was injected into the epidural space and the flow of contrast was observed. Radiographs were obtained for documentation purposes.    The injectate was administered into the level noted above.   Additional Comments:  The patient tolerated the procedure well Dressing: 2 x 2 sterile gauze and Band-Aid    Post-procedure details: Patient was observed during the procedure. Post-procedure instructions were reviewed.  Patient left the clinic in stable condition.

## 2019-08-22 NOTE — Progress Notes (Signed)
Casey Fisher - 85 y.o. male MRN [id number]  Date of birth: 05/21/1928  Office Visit Note: Visit Date: 08/21/2019 PCP: [name], MD Referred by: [name], MD  Subjective: Chief Complaint  Patient presents with  . Lower Back - Pain   HPI:  Casey Fisher is a 85 y.o. male who comes in today At the request of Dr. Joni Fears for L2-3 interlaminar dural steroid injection.  Patient does have a history of hereditary and idiopathic peripheral polyneuropathy.  He sits today in wheelchair but can stand and ambulate but is had recent falls.  His biggest complaint is axial low back pain that started a while ago worse with walking and standing.  He reports his pain is a 5 out of 10 but it really is problematic for him.  He has had no prior spine surgery.  He has started to have some physical therapy.  MRI evidence of facet arthropathy and lateral recess narrowing particular at the L2-3 and 3-4 region.  He also has facet arthropathy more at L3-4 and L4-5 and L5-S1.  Depending on results with epidural injection would complete diagnostic and hopefully therapeutic facet joint blocks for his back pain.  ROS Otherwise per HPI.  Assessment & Plan: Visit Diagnoses:  1. Spinal stenosis of lumbar region with neurogenic claudication     Plan: No additional findings.   Meds & Orders:  Meds ordered this encounter  Medications  . methylPREDNISolone acetate (DEPO-MEDROL) injection 80 mg    Orders Placed This Encounter  Procedures  . XR C-ARM NO REPORT  . Epidural Steroid injection    Follow-up: Return if symptoms worsen or fail to improve, for Conider facet blocks L3-4 and L4-5.   Procedures: No procedures performed  Lumbar Epidural Steroid Injection - Interlaminar Approach with Fluoroscopic Guidance  Patient: Casey Fisher      Date of Birth: 23-Jul-1928 MRN: [id number] PCP: [name], MD      Visit Date: 08/21/2019   Universal Protocol:     Consent Given  By: the patient  Position: PRONE  Additional Comments: Vital signs were monitored before and after the procedure. Patient was prepped and draped in the usual sterile fashion. The correct patient, procedure, and site was verified.   Injection Procedure Details:  Procedure Site One Meds Administered:  Meds ordered this encounter  Medications  . methylPREDNISolone acetate (DEPO-MEDROL) injection 80 mg     Laterality: Right  Location/Site:  L2-L3  Needle size: 20 G  Needle type: Tuohy  Needle Placement: Paramedian epidural  Findings:   -Comments: Excellent flow of contrast into the epidural space.  Procedure Details: Using a paramedian approach from the side mentioned above, the region overlying the inferior lamina was localized under fluoroscopic visualization and the soft tissues overlying this structure were infiltrated with 4 ml. of 1% Lidocaine without Epinephrine. The Tuohy needle was inserted into the epidural space using a paramedian approach.   The epidural space was localized using loss of resistance along with lateral and bi-planar fluoroscopic views.  After negative aspirate for air, blood, and CSF, a 2 ml. volume of Isovue-250 was injected into the epidural space and the flow of contrast was observed. Radiographs were obtained for documentation purposes.    The injectate was administered into the level noted above.   Additional Comments:  The patient tolerated the procedure well Dressing: 2 x 2 sterile gauze and Band-Aid    Post-procedure details: Patient was observed during the procedure. Post-procedure  instructions were reviewed.  Patient left the clinic in stable condition.   Clinical History: MRI LUMBAR SPINE WITHOUT CONTRAST  TECHNIQUE: Multiplanar, multisequence MR imaging of the lumbar spine was performed. No intravenous contrast was administered.  COMPARISON:  Plain films 06/29/2019.  FINDINGS: Segmentation:  Standard.  Alignment:  Degenerative scoliosis convex RIGHT, apex L1-L2, estimated 23 degrees based on plain films. Trace anterolisthesis L4-5, facet mediated.  Vertebrae:  No fracture, evidence of discitis, or bone lesion.  Conus medullaris and cauda equina: Conus extends to the L1 level. Conus and cauda equina appear normal.  Paraspinal and other soft tissues: Renal cystic disease incompletely evaluated. Thick walled and distended bladder, likely outlet obstruction.  Disc levels:  L1-L2: Annular bulge. Asymmetric osseous spurring and facet arthropathy to the LEFT. Subarticular zone and foraminal zone narrowing could affect the L1 and L2 nerve roots on that side.  L2-L3: Asymmetric loss of interspace height to the LEFT. Annular bulge. Suspected LEFT L2 and L3 neural impingement with borderline stenosis.  L3-L4: Subtle rightward translation L3 on L4. Facet arthropathy. Foraminal protrusion to the RIGHT. No subarticular zone narrowing. RIGHT L3 neural impingement is possible however.  L4-L5: Trace anterolisthesis. Annular bulge. Posterior element hypertrophy. No definite subarticular zone or foraminal zone narrowing.  L5-S1: Moderate to advanced disc space narrowing. Facet arthropathy. No impingement.  IMPRESSION: LEFT-sided symptomatology most likely derives from abnormalities at L1-2 and L2-3, related to degenerative scoliosis greater than 20 degrees. Chronic changes of osseous spurring, and asymmetric foraminal narrowing with bony overgrowth could affect the LEFT L1, L2, and or L3 nerve roots. See discussion above.  No osseous metastatic disease related to prostate cancer is observed. There may be bladder outlet obstruction changes.   Electronically Signed   By: Staci Righter M.D.   On: 07/30/2019 19:27     Objective:  VS:  HT:    WT:   BMI:     BP:(!) 155/82  HR:(!) 58bpm  TEMP: ( )  RESP:  Physical Exam  Ortho Exam Imaging: Xr C-arm No Report  Result Date:  08/21/2019 Please see Notes tab for imaging impression.

## 2019-08-24 DIAGNOSIS — R310 Gross hematuria: Secondary | ICD-10-CM | POA: Diagnosis not present

## 2019-08-24 DIAGNOSIS — C61 Malignant neoplasm of prostate: Secondary | ICD-10-CM | POA: Diagnosis not present

## 2019-08-24 DIAGNOSIS — R3914 Feeling of incomplete bladder emptying: Secondary | ICD-10-CM | POA: Diagnosis not present

## 2019-08-25 ENCOUNTER — Other Ambulatory Visit: Payer: Self-pay

## 2019-08-25 ENCOUNTER — Ambulatory Visit (INDEPENDENT_AMBULATORY_CARE_PROVIDER_SITE_OTHER): Payer: Medicare Other | Admitting: Podiatry

## 2019-08-25 ENCOUNTER — Encounter: Payer: Self-pay | Admitting: Podiatry

## 2019-08-25 DIAGNOSIS — I10 Essential (primary) hypertension: Secondary | ICD-10-CM | POA: Diagnosis not present

## 2019-08-25 DIAGNOSIS — B351 Tinea unguium: Secondary | ICD-10-CM | POA: Diagnosis not present

## 2019-08-25 DIAGNOSIS — F329 Major depressive disorder, single episode, unspecified: Secondary | ICD-10-CM | POA: Diagnosis not present

## 2019-08-25 DIAGNOSIS — G629 Polyneuropathy, unspecified: Secondary | ICD-10-CM

## 2019-08-25 DIAGNOSIS — L84 Corns and callosities: Secondary | ICD-10-CM | POA: Diagnosis not present

## 2019-08-25 DIAGNOSIS — M79675 Pain in left toe(s): Secondary | ICD-10-CM

## 2019-08-25 DIAGNOSIS — M79674 Pain in right toe(s): Secondary | ICD-10-CM | POA: Diagnosis not present

## 2019-08-25 DIAGNOSIS — C61 Malignant neoplasm of prostate: Secondary | ICD-10-CM | POA: Diagnosis not present

## 2019-08-25 DIAGNOSIS — M5136 Other intervertebral disc degeneration, lumbar region: Secondary | ICD-10-CM | POA: Diagnosis not present

## 2019-08-25 DIAGNOSIS — M25512 Pain in left shoulder: Secondary | ICD-10-CM | POA: Diagnosis not present

## 2019-08-25 DIAGNOSIS — G609 Hereditary and idiopathic neuropathy, unspecified: Secondary | ICD-10-CM | POA: Diagnosis not present

## 2019-08-25 NOTE — Patient Instructions (Addendum)
Peripheral Neuropathy Peripheral neuropathy is a type of nerve damage. It affects nerves that carry signals between the spinal cord and the arms, legs, and the rest of the body (peripheral nerves). It does not affect nerves in the spinal cord or brain. In peripheral neuropathy, one nerve or a group of nerves may be damaged. Peripheral neuropathy is a broad category that includes many specific nerve disorders, like diabetic neuropathy, hereditary neuropathy, and carpal tunnel syndrome. What are the causes? This condition may be caused by:  Diabetes. This is the most common cause of peripheral neuropathy.  Nerve injury.  Pressure or stress on a nerve that lasts a long time.  Lack (deficiency) of B vitamins. This can result from alcoholism, poor diet, or a restricted diet.  Infections.  Autoimmune diseases, such as rheumatoid arthritis and systemic lupus erythematosus.  Nerve diseases that are passed from parent to child (inherited).  Some medicines, such as cancer medicines (chemotherapy).  Poisonous (toxic) substances, such as lead and mercury.  Too little blood flowing to the legs.  Kidney disease.  Thyroid disease. In some cases, the cause of this condition is not known. What are the signs or symptoms? Symptoms of this condition depend on which of your nerves is damaged. Common symptoms include:  Loss of feeling (numbness) in the feet, hands, or both.  Tingling in the feet, hands, or both.  Burning pain.  Very sensitive skin.  Weakness.  Not being able to move a part of the body (paralysis).  Muscle twitching.  Clumsiness or poor coordination.  Loss of balance.  Not being able to control your bladder.  Feeling dizzy.  Sexual problems. How is this diagnosed? Diagnosing and finding the cause of peripheral neuropathy can be difficult. Your health care provider will take your medical history and do a physical exam. A neurological exam will also be done. This  involves checking things that are affected by your brain, spinal cord, and nerves (nervous system). For example, your health care provider will check your reflexes, how you move, and what you can feel. You may have other tests, such as:  Blood tests.  Electromyogram (EMG) and nerve conduction tests. These tests check nerve function and how well the nerves are controlling the muscles.  Imaging tests, such as CT scans or MRI to rule out other causes of your symptoms.  Removing a small piece of nerve to be examined in a lab (nerve biopsy). This is rare.  Removing and examining a small amount of the fluid that surrounds the brain and spinal cord (lumbar puncture). This is rare. How is this treated? Treatment for this condition may involve:  Treating the underlying cause of the neuropathy, such as diabetes, kidney disease, or vitamin deficiencies.  Stopping medicines that can cause neuropathy, such as chemotherapy.  Medicine to relieve pain. Medicines may include: ? Prescription or over-the-counter pain medicine. ? Antiseizure medicine. ? Antidepressants. ? Pain-relieving patches that are applied to painful areas of skin.  Surgery to relieve pressure on a nerve or to destroy a nerve that is causing pain.  Physical therapy to help improve movement and balance.  Devices to help you move around (assistive devices). Follow these instructions at home: Medicines  Take over-the-counter and prescription medicines only as told by your health care provider. Do not take any other medicines without first asking your health care provider.  Do not drive or use heavy machinery while taking prescription pain medicine. Lifestyle   Do not use any products that contain nicotine   or tobacco, such as cigarettes and e-cigarettes. Smoking keeps blood from reaching damaged nerves. If you need help quitting, ask your health care provider.  Avoid or limit alcohol. Too much alcohol can cause a vitamin B  deficiency, and vitamin B is needed for healthy nerves.  Eat a healthy diet. This includes: ? Eating foods that are high in fiber, such as fresh fruits and vegetables, whole grains, and beans. ? Limiting foods that are high in fat and processed sugars, such as fried or sweet foods. General instructions   If you have diabetes, work closely with your health care provider to keep your blood sugar under control.  If you have numbness in your feet: ? Check every day for signs of injury or infection. Watch for redness, warmth, and swelling. ? Wear padded socks and comfortable shoes. These help protect your feet.  Develop a good support system. Living with peripheral neuropathy can be stressful. Consider talking with a mental health specialist or joining a support group.  Use assistive devices and attend physical therapy as told by your health care provider. This may include using a walker or a cane.  Keep all follow-up visits as told by your health care provider. This is important. Contact a health care provider if:  You have new signs or symptoms of peripheral neuropathy.  You are struggling emotionally from dealing with peripheral neuropathy.  Your pain is not well-controlled. Get help right away if:  You have an injury or infection that is not healing normally.  You develop new weakness in an arm or leg.  You fall frequently. Summary  Peripheral neuropathy is when the nerves in the arms, or legs are damaged, resulting in numbness, weakness, or pain.  There are many causes of peripheral neuropathy, including diabetes, pinched nerves, vitamin deficiencies, autoimmune disease, and hereditary conditions.  Diagnosing and finding the cause of peripheral neuropathy can be difficult. Your health care provider will take your medical history, do a physical exam, and do tests, including blood tests and nerve function tests.  Treatment involves treating the underlying cause of the  neuropathy and taking medicines to help control pain. Physical therapy and assistive devices may also help. This information is not intended to replace advice given to you by your health care provider. Make sure you discuss any questions you have with your health care provider. Document Released: 12/04/2002 Document Revised: 11/26/2017 Document Reviewed: 02/22/2017 Elsevier Patient Education  2020 [employer] Inc.   Onychomycosis/Fungal Toenails  WHAT IS IT? An infection that lies within the keratin of your nail plate that is caused by a fungus.  WHY ME? Fungal infections affect all ages, sexes, races, and creeds.  There may be many factors that predispose you to a fungal infection such as age, coexisting medical conditions such as diabetes, or an autoimmune disease; stress, medications, fatigue, genetics, etc.  Bottom line: fungus thrives in a warm, moist environment and your shoes offer such a location.  IS IT CONTAGIOUS? Theoretically, yes.  You do not want to share shoes, nail clippers or files with someone who has fungal toenails.  Walking around barefoot in the same room or sleeping in the same bed is unlikely to transfer the organism.  It is important to realize, however, that fungus can spread easily from one nail to the next on the same foot.  HOW DO WE TREAT THIS?  There are several ways to treat this condition.  Treatment may depend on many factors such as age, medications, pregnancy, liver and   kidney conditions, etc.  It is best to ask your doctor which options are available to you.  1. No treatment.   Unlike many other medical concerns, you can live with this condition.  However for many people this can be a painful condition and may lead to ingrown toenails or a bacterial infection.  It is recommended that you keep the nails cut short to help reduce the amount of fungal nail. 2. Topical treatment.  These range from herbal remedies to prescription strength nail lacquers.  About 40-50%  effective, topicals require twice daily application for approximately 9 to 12 months or until an entirely new nail has grown out.  The most effective topicals are medical grade medications available through physicians offices. 3. Oral antifungal medications.  With an 80-90% cure rate, the most common oral medication requires 3 to 4 months of therapy and stays in your system for a year as the new nail grows out.  Oral antifungal medications do require blood work to make sure it is a safe drug for you.  A liver function panel will be performed prior to starting the medication and after the first month of treatment.  It is important to have the blood work performed to avoid any harmful side effects.  In general, this medication safe but blood work is required. 4. Laser Therapy.  This treatment is performed by applying a specialized laser to the affected nail plate.  This therapy is noninvasive, fast, and non-painful.  It is not covered by insurance and is therefore, out of pocket.  The results have been very good with a 80-95% cure rate.  The [facility] is the only practice in the area to offer this therapy. 5. Permanent Nail Avulsion.  Removing the entire nail so that a new nail will not grow back. 

## 2019-08-29 DIAGNOSIS — I10 Essential (primary) hypertension: Secondary | ICD-10-CM | POA: Diagnosis not present

## 2019-08-29 DIAGNOSIS — G609 Hereditary and idiopathic neuropathy, unspecified: Secondary | ICD-10-CM | POA: Diagnosis not present

## 2019-08-29 DIAGNOSIS — F329 Major depressive disorder, single episode, unspecified: Secondary | ICD-10-CM | POA: Diagnosis not present

## 2019-08-29 DIAGNOSIS — M5136 Other intervertebral disc degeneration, lumbar region: Secondary | ICD-10-CM | POA: Diagnosis not present

## 2019-08-29 DIAGNOSIS — M25512 Pain in left shoulder: Secondary | ICD-10-CM | POA: Diagnosis not present

## 2019-08-29 DIAGNOSIS — C61 Malignant neoplasm of prostate: Secondary | ICD-10-CM | POA: Diagnosis not present

## 2019-08-31 ENCOUNTER — Other Ambulatory Visit: Payer: Self-pay

## 2019-08-31 NOTE — Patient Outreach (Signed)
[facility]) Care Management  08/31/2019  Casey Fisher 01/20/1928 [id number]   Telephone call to patient for check in.  Patient states that he is doing pretty good.  He states that he has his shot to his back.  He denies present discomfort but states that his back feels the same.  Patient states that his blood pressure has been good and less than 140/80.  Discussed BP control.  He verbalized understanding.  PT continues to follow patient.  Encouraged patient to do exercises given by therapist.  He verbalized understanding.  Discussed with patient moving patient to health coach for further disease management.    Patient agreeable and will wait letter and phone call.    Plan: RN CM will be transferring patient to health coach for further disease management and support for HTN.  Casey Baseman, RN, MSN [place] [facility] Care Management Coordinator Direct Line [phone] Cell [phone] Toll Free: [phone]  Fax: [phone]

## 2019-08-31 NOTE — Patient Outreach (Signed)
[facility]) Care Management  08/31/2019  MERLE RAJENDRAN Aug 02, 1928 [id number]   Contacted patient to inform him that he has been approved for [employer] transportation services and will receive certification packet via mail.  Closing social work case at this time.  [name], BSW Social Worker [phone]

## 2019-09-01 DIAGNOSIS — M5136 Other intervertebral disc degeneration, lumbar region: Secondary | ICD-10-CM | POA: Diagnosis not present

## 2019-09-01 DIAGNOSIS — F329 Major depressive disorder, single episode, unspecified: Secondary | ICD-10-CM | POA: Diagnosis not present

## 2019-09-01 DIAGNOSIS — C61 Malignant neoplasm of prostate: Secondary | ICD-10-CM | POA: Diagnosis not present

## 2019-09-01 DIAGNOSIS — G609 Hereditary and idiopathic neuropathy, unspecified: Secondary | ICD-10-CM | POA: Diagnosis not present

## 2019-09-01 DIAGNOSIS — I10 Essential (primary) hypertension: Secondary | ICD-10-CM | POA: Diagnosis not present

## 2019-09-01 DIAGNOSIS — M25512 Pain in left shoulder: Secondary | ICD-10-CM | POA: Diagnosis not present

## 2019-09-03 NOTE — Progress Notes (Signed)
Subjective: Casey Fisher presents to clinic with cc of painful mycotic toenails and calluses b/l feet which are aggravated when weightbearing with and without shoe gear.  This pain limits his daily activities. Pain symptoms resolve with periodic professional debridement.   Current Outpatient Medications:  .  amLODipine (NORVASC) 5 MG tablet, TAKE 1 TABLET BY MOUTH EVERY DAY, Disp: 30 tablet, Rfl: 1 .  ammonium lactate (AMLACTIN) 12 % lotion, Apply to both feet twice daily for dry skin, Disp: 396 g, Rfl: 4 .  aspirin EC 81 MG tablet, Take 81 mg by mouth daily. Reported on 05/14/2016, Disp: , Rfl:  .  Calcium Carb-Cholecalciferol (CALCIUM 600 + D PO), Take 1 tablet by mouth daily., Disp: , Rfl:  .  DULoxetine (CYMBALTA) 30 MG capsule, Take 1 capsule (30 mg total) by mouth daily., Disp: 60 capsule, Rfl: 5 .  ERLEADA 60 MG tablet, , Disp: , Rfl:  .  gabapentin (NEURONTIN) 600 MG tablet, TAKE 2 TABLET BY MOUTH IN MORNING AND 2 TABLET AT MIDDAY AND 1 TABLET IN EVENING, Disp: 450 tablet, Rfl: 1 .  hydrochlorothiazide (HYDRODIURIL) 25 MG tablet, Take 1 tablet (25 mg total) by mouth daily., Disp: 90 tablet, Rfl: 1 .  Leuprolide Acetate (LUPRON IJ), Inject 1 application as directed every 6 (six) months., Disp: , Rfl:  .  RAPAFLO 8 MG CAPS capsule, , Disp: , Rfl: 0 .  simvastatin (ZOCOR) 40 MG tablet, TAKE 1 TABLET BY MOUTH ONCE DAILY, Disp: 90 tablet, Rfl: 0 .  tamsulosin (FLOMAX) 0.4 MG CAPS capsule, Take 0.4 mg daily by mouth. Reported on 05/14/2016, Disp: , Rfl: 1   Allergies  Allergen Reactions  . Lyrica [Pregabalin] Other (See Comments)    "constipation"     Objective: Physical Examination:  Vascular  Examination: Capillary refill time immediate x 10 digits.  Palpable DP pulses b/l.   Nonpalpable PT pulses b/l.  Digital hair absent b/l.  No edema noted b/l.  Skin temperature gradient warm to cool b/l.  Varicosities b/l.   Dermatological Examination: Skin with normal turgor,  texture and tone b/l.  No open wounds b/l.  No interdigital macerations noted b/l.  Elongated, thick, discolored brittle toenails with subungual debris and pain on dorsal palpation of nailbeds 1-5 b/l.  Hyperkeratotic lesion submet head 1 b/l with tenderness to palpation. No edema, no erythema, no drainage, no flocculence.  Musculoskeletal Examination: Muscle strength 5/5 to all muscle groups b/l.  HAV with bunion b/l. Hammertoes 2-5 b/l.  No pain, crepitus or joint discomfort with active/passive ROM.  Neurological Examination: Sensation diminished b/l with 10 gram monofilament.  Assessment: 1. Mycotic nail infection with pain 1-5 b/l 2. Calluses submet head 1 b/l 3. Neuropathy  Plan: 1. Toenails 1-5 b/l were debrided in length and girth without iatrogenic laceration. 2. Calluses pared submetatarsal head(s) 1 b/l utilizing sterile scalpel blade without incident. Continue soft, supportive shoe gear daily. Report any pedal injuries to medical professional. Follow up 3 months. Patient/POA to call should there be a question/concern in there interim.

## 2019-09-07 DIAGNOSIS — M25512 Pain in left shoulder: Secondary | ICD-10-CM | POA: Diagnosis not present

## 2019-09-07 DIAGNOSIS — M5136 Other intervertebral disc degeneration, lumbar region: Secondary | ICD-10-CM | POA: Diagnosis not present

## 2019-09-07 DIAGNOSIS — G609 Hereditary and idiopathic neuropathy, unspecified: Secondary | ICD-10-CM | POA: Diagnosis not present

## 2019-09-07 DIAGNOSIS — C61 Malignant neoplasm of prostate: Secondary | ICD-10-CM | POA: Diagnosis not present

## 2019-09-07 DIAGNOSIS — I10 Essential (primary) hypertension: Secondary | ICD-10-CM | POA: Diagnosis not present

## 2019-09-07 DIAGNOSIS — F329 Major depressive disorder, single episode, unspecified: Secondary | ICD-10-CM | POA: Diagnosis not present

## 2019-09-09 DIAGNOSIS — F419 Anxiety disorder, unspecified: Secondary | ICD-10-CM | POA: Diagnosis not present

## 2019-09-09 DIAGNOSIS — Z9842 Cataract extraction status, left eye: Secondary | ICD-10-CM | POA: Diagnosis not present

## 2019-09-09 DIAGNOSIS — Z87891 Personal history of nicotine dependence: Secondary | ICD-10-CM | POA: Diagnosis not present

## 2019-09-09 DIAGNOSIS — G609 Hereditary and idiopathic neuropathy, unspecified: Secondary | ICD-10-CM | POA: Diagnosis not present

## 2019-09-09 DIAGNOSIS — C61 Malignant neoplasm of prostate: Secondary | ICD-10-CM | POA: Diagnosis not present

## 2019-09-09 DIAGNOSIS — Z9841 Cataract extraction status, right eye: Secondary | ICD-10-CM | POA: Diagnosis not present

## 2019-09-09 DIAGNOSIS — R600 Localized edema: Secondary | ICD-10-CM | POA: Diagnosis not present

## 2019-09-09 DIAGNOSIS — Z7982 Long term (current) use of aspirin: Secondary | ICD-10-CM | POA: Diagnosis not present

## 2019-09-09 DIAGNOSIS — M25512 Pain in left shoulder: Secondary | ICD-10-CM | POA: Diagnosis not present

## 2019-09-09 DIAGNOSIS — F329 Major depressive disorder, single episode, unspecified: Secondary | ICD-10-CM | POA: Diagnosis not present

## 2019-09-09 DIAGNOSIS — I10 Essential (primary) hypertension: Secondary | ICD-10-CM | POA: Diagnosis not present

## 2019-09-09 DIAGNOSIS — R413 Other amnesia: Secondary | ICD-10-CM | POA: Diagnosis not present

## 2019-09-09 DIAGNOSIS — E785 Hyperlipidemia, unspecified: Secondary | ICD-10-CM | POA: Diagnosis not present

## 2019-09-09 DIAGNOSIS — G709 Myoneural disorder, unspecified: Secondary | ICD-10-CM | POA: Diagnosis not present

## 2019-09-09 DIAGNOSIS — Z79818 Long term (current) use of other agents affecting estrogen receptors and estrogen levels: Secondary | ICD-10-CM | POA: Diagnosis not present

## 2019-09-09 DIAGNOSIS — Z9181 History of falling: Secondary | ICD-10-CM | POA: Diagnosis not present

## 2019-09-09 DIAGNOSIS — M4186 Other forms of scoliosis, lumbar region: Secondary | ICD-10-CM | POA: Diagnosis not present

## 2019-09-09 DIAGNOSIS — M5136 Other intervertebral disc degeneration, lumbar region: Secondary | ICD-10-CM | POA: Diagnosis not present

## 2019-09-12 DIAGNOSIS — I10 Essential (primary) hypertension: Secondary | ICD-10-CM | POA: Diagnosis not present

## 2019-09-12 DIAGNOSIS — G609 Hereditary and idiopathic neuropathy, unspecified: Secondary | ICD-10-CM | POA: Diagnosis not present

## 2019-09-12 DIAGNOSIS — C61 Malignant neoplasm of prostate: Secondary | ICD-10-CM | POA: Diagnosis not present

## 2019-09-12 DIAGNOSIS — F329 Major depressive disorder, single episode, unspecified: Secondary | ICD-10-CM | POA: Diagnosis not present

## 2019-09-12 DIAGNOSIS — M25512 Pain in left shoulder: Secondary | ICD-10-CM | POA: Diagnosis not present

## 2019-09-12 DIAGNOSIS — M5136 Other intervertebral disc degeneration, lumbar region: Secondary | ICD-10-CM | POA: Diagnosis not present

## 2019-09-14 ENCOUNTER — Other Ambulatory Visit: Payer: Self-pay

## 2019-09-14 DIAGNOSIS — C61 Malignant neoplasm of prostate: Secondary | ICD-10-CM | POA: Diagnosis not present

## 2019-09-14 DIAGNOSIS — M25512 Pain in left shoulder: Secondary | ICD-10-CM | POA: Diagnosis not present

## 2019-09-14 DIAGNOSIS — M5136 Other intervertebral disc degeneration, lumbar region: Secondary | ICD-10-CM | POA: Diagnosis not present

## 2019-09-14 DIAGNOSIS — I10 Essential (primary) hypertension: Secondary | ICD-10-CM | POA: Diagnosis not present

## 2019-09-14 DIAGNOSIS — F329 Major depressive disorder, single episode, unspecified: Secondary | ICD-10-CM | POA: Diagnosis not present

## 2019-09-14 DIAGNOSIS — G609 Hereditary and idiopathic neuropathy, unspecified: Secondary | ICD-10-CM | POA: Diagnosis not present

## 2019-09-14 NOTE — Patient Outreach (Signed)
[facility]) Care Management  09/14/2019  Casey Fisher Oct 07, 1928 [id number]   Telephone call to patient for check in.  He states he is doing well. He reports that he remains independent with care, no falls.  Friend from across the street comes by to bring patient dinner daily.  He reports that daughter calls frequently.  He states that the therapist comes twice a week.  He states that his blood pressure has been good but does not remember what it is.  Encouraged patient to continue regimen.  He verbalized understanding.  He denies any needs at this time.  Plan: RN CM will contact patient in the next two weeks and patient is agreeable.   Jone Baseman, RN, MSN [place] [facility] Care Management Coordinator Direct Line [phone] Cell [phone] Toll Free: [phone]  Fax: [phone]

## 2019-09-18 DIAGNOSIS — M5136 Other intervertebral disc degeneration, lumbar region: Secondary | ICD-10-CM | POA: Diagnosis not present

## 2019-09-18 DIAGNOSIS — C61 Malignant neoplasm of prostate: Secondary | ICD-10-CM | POA: Diagnosis not present

## 2019-09-18 DIAGNOSIS — G609 Hereditary and idiopathic neuropathy, unspecified: Secondary | ICD-10-CM | POA: Diagnosis not present

## 2019-09-18 DIAGNOSIS — M25512 Pain in left shoulder: Secondary | ICD-10-CM | POA: Diagnosis not present

## 2019-09-18 DIAGNOSIS — F329 Major depressive disorder, single episode, unspecified: Secondary | ICD-10-CM | POA: Diagnosis not present

## 2019-09-18 DIAGNOSIS — I10 Essential (primary) hypertension: Secondary | ICD-10-CM | POA: Diagnosis not present

## 2019-09-25 DIAGNOSIS — M25512 Pain in left shoulder: Secondary | ICD-10-CM | POA: Diagnosis not present

## 2019-09-25 DIAGNOSIS — M5136 Other intervertebral disc degeneration, lumbar region: Secondary | ICD-10-CM | POA: Diagnosis not present

## 2019-09-25 DIAGNOSIS — I10 Essential (primary) hypertension: Secondary | ICD-10-CM | POA: Diagnosis not present

## 2019-09-25 DIAGNOSIS — G609 Hereditary and idiopathic neuropathy, unspecified: Secondary | ICD-10-CM | POA: Diagnosis not present

## 2019-09-25 DIAGNOSIS — C61 Malignant neoplasm of prostate: Secondary | ICD-10-CM | POA: Diagnosis not present

## 2019-09-25 DIAGNOSIS — F329 Major depressive disorder, single episode, unspecified: Secondary | ICD-10-CM | POA: Diagnosis not present

## 2019-09-28 ENCOUNTER — Other Ambulatory Visit: Payer: Self-pay

## 2019-09-28 ENCOUNTER — Other Ambulatory Visit: Payer: Self-pay | Admitting: *Deleted

## 2019-09-28 NOTE — Patient Outreach (Signed)
Casey Fisher [facility]) Care Management  Casey Fisher  09/28/2019   Casey Fisher 30-Jun-1928 [id number]  Subjective: Telephone call to patient for check in.  Patient doing alright.  He lives alone but as support of neighbor and friend.  Daughter lives in [place] but communicate often and has food delivered to patient. Patient has PT but patient states that PT.  Patient has back problems and has received a back injection in August. Patient manages intermittent pain.  Patient blood pressure is doing well less than 140/80 per patient.  Reviewed diet and exercise with patient.  Also advised patient that he will be followed by one of our health coaches going forward.  He verbalized understanding.     Objective:   Encounter Medications:  Outpatient Encounter Medications as of 09/28/2019  Medication Sig  . amLODipine (NORVASC) 5 MG tablet TAKE 1 TABLET BY MOUTH EVERY DAY  . ammonium lactate (AMLACTIN) 12 % lotion Apply to both feet twice daily for dry skin  . aspirin EC 81 MG tablet Take 81 mg by mouth daily. Reported on 05/14/2016  . Calcium Carb-Cholecalciferol (CALCIUM 600 + D PO) Take 1 tablet by mouth daily.  . DULoxetine (CYMBALTA) 30 MG capsule Take 1 capsule (30 mg total) by mouth daily.  . ERLEADA 60 MG tablet   . gabapentin (NEURONTIN) 600 MG tablet TAKE 2 TABLET BY MOUTH IN MORNING AND 2 TABLET AT MIDDAY AND 1 TABLET IN EVENING  . hydrochlorothiazide (HYDRODIURIL) 25 MG tablet Take 1 tablet (25 mg total) by mouth daily.  Marland Kitchen Leuprolide Acetate (LUPRON IJ) Inject 1 application as directed every 6 (six) months.  Marland Kitchen RAPAFLO 8 MG CAPS capsule   . simvastatin (ZOCOR) 40 MG tablet TAKE 1 TABLET BY MOUTH ONCE DAILY  . tamsulosin (FLOMAX) 0.4 MG CAPS capsule Take 0.4 mg daily by mouth. Reported on 05/14/2016   No facility-administered encounter medications on file as of 09/28/2019.     Functional Status:  In your present state of health, do you have any difficulty performing  the following activities: 08/08/2019 05/31/2019  Hearing? N N  Vision? N N  Difficulty concentrating or making decisions? N N  Walking or climbing stairs? Y Y  Comment back problems -  Dressing or bathing? N N  Doing errands, shopping? Y N  Comment depends on daughter and friend patient states he is still running errands for himself  Conservation officer, nature and eating ? N -  Using the Toilet? N -  In the past six months, have you accidently leaked urine? N -  Do you have problems with loss of bowel control? N -  Managing your Medications? N -  Managing your Finances? N -  Housekeeping or managing your Housekeeping? N -  Some recent data might be hidden    Fall/Depression Screening: Fall Risk  08/08/2019 08/07/2019 07/17/2019  Falls in the past year? 0 0 1  Comment - - -  Number falls in past yr: 0 0 1  Comment - - -  Injury with Fall? - 0 1  Risk Factor Category  - - -  Risk for fall due to : - - -  Follow up - Falls evaluation completed -   PHQ 2/9 Scores 08/31/2019 08/07/2019 07/17/2019 06/14/2019 05/31/2019 05/30/2019 09/06/2018  PHQ - 2 Score 0 0 0 0 0 0 0    Assessment: Patient managing HTN well. Tries to exercise to maintain strength.    Plan: RN CM will refer patient to health coach  for continued disease management and support of HTN.   RN CM sent discipline closure letter to PCP.    Jone Baseman, RN, MSN [place] [facility] Care Management Coordinator Direct Line [phone] Cell [phone] Toll Free: [phone]  Fax: [phone]

## 2019-10-04 DIAGNOSIS — C61 Malignant neoplasm of prostate: Secondary | ICD-10-CM | POA: Diagnosis not present

## 2019-10-04 DIAGNOSIS — F329 Major depressive disorder, single episode, unspecified: Secondary | ICD-10-CM | POA: Diagnosis not present

## 2019-10-04 DIAGNOSIS — M5136 Other intervertebral disc degeneration, lumbar region: Secondary | ICD-10-CM | POA: Diagnosis not present

## 2019-10-04 DIAGNOSIS — I10 Essential (primary) hypertension: Secondary | ICD-10-CM | POA: Diagnosis not present

## 2019-10-04 DIAGNOSIS — M25512 Pain in left shoulder: Secondary | ICD-10-CM | POA: Diagnosis not present

## 2019-10-04 DIAGNOSIS — G609 Hereditary and idiopathic neuropathy, unspecified: Secondary | ICD-10-CM | POA: Diagnosis not present

## 2019-10-05 ENCOUNTER — Other Ambulatory Visit: Payer: Self-pay

## 2019-10-05 ENCOUNTER — Ambulatory Visit (INDEPENDENT_AMBULATORY_CARE_PROVIDER_SITE_OTHER): Payer: Medicare Other | Admitting: Family Medicine

## 2019-10-05 ENCOUNTER — Encounter: Payer: Self-pay | Admitting: Family Medicine

## 2019-10-05 VITALS — BP 124/65 | HR 70 | Temp 98.6°F

## 2019-10-05 DIAGNOSIS — H539 Unspecified visual disturbance: Secondary | ICD-10-CM

## 2019-10-05 DIAGNOSIS — H9312 Tinnitus, left ear: Secondary | ICD-10-CM

## 2019-10-05 DIAGNOSIS — R29898 Other symptoms and signs involving the musculoskeletal system: Secondary | ICD-10-CM

## 2019-10-05 DIAGNOSIS — H9193 Unspecified hearing loss, bilateral: Secondary | ICD-10-CM | POA: Diagnosis not present

## 2019-10-05 DIAGNOSIS — R5381 Other malaise: Secondary | ICD-10-CM

## 2019-10-05 DIAGNOSIS — M5136 Other intervertebral disc degeneration, lumbar region: Secondary | ICD-10-CM | POA: Diagnosis not present

## 2019-10-05 DIAGNOSIS — Z23 Encounter for immunization: Secondary | ICD-10-CM

## 2019-10-05 DIAGNOSIS — H6123 Impacted cerumen, bilateral: Secondary | ICD-10-CM | POA: Diagnosis not present

## 2019-10-05 NOTE — Patient Instructions (Addendum)
No change in medicines at this time.  I will reorder physical therapy as I think that will continue to be helpful.  I did place another referral to audiology so they can evaluate for hearing aids.   Earwax was removed today which should help with hearing some as well.   Follow-up with eye doctor to discuss difficulty with vision, but make sure to wear the corrective lenses as needed throughout the day.     If you have lab work done today you will be contacted with your lab results within the next 2 weeks.  If you have not heard from [place] then please contact us. The fastest way to get your results is to register for My Chart.   IF you received an x-ray today, you will receive an invoice from [facility]. Please contact [facility] at [phone] with questions or concerns regarding your invoice.   IF you received labwork today, you will receive an invoice from [place]. Please contact [facility] at [phone] with questions or concerns regarding your invoice.   Our billing staff will not be able to assist you with questions regarding bills from these companies.  You will be contacted with the lab results as soon as they are available. The fastest way to get your results is to activate your My Chart account. Instructions are located on the last page of this paperwork. If you have not heard from [place] regarding the results in 2 weeks, please contact this office.

## 2019-10-05 NOTE — Progress Notes (Signed)
Subjective:    Patient ID: Casey Fisher, male    DOB: Apr 07, 1928, 85 y.o.   MRN: [id number]  HPI Casey Fisher is a 85 y.o. male Presents today for: Chief Complaint  Patient presents with  . Extremity Weakness     2 month f/u on leg weakness. Do think dad need to continue with therapy a little while longer. It seems to be working  . ear loss    Have some ringing and popping in left ear. Need to get a referral to and ear specialist  here with friend. In wheelchair.   Leg weakness, peripheral neuropathy, DDD lumbar, physical deconditioning.   -Last visit August 10.  Likely multiple comorbidities as above.  Under care of neurology for peripheral neuropathy as well as memory changes and back specialist, with prior lumbar epidural injection. Dr. Ernestina Patches with [facility] ortho care physiatry.  Social work referral was ordered, scat transportation system paperwork obtained and completed.  Meals on Wheels had been ordered previously.  Home health physical therapy evaluation was ordered.  He is followed by [place] coach.  Now has Meals on Wheels - started this week.  Home physical therapy - has been helping, on last week, but would like to continue. Picking up feet better. Some walking on own, stretching. Uses cane or walker at home. No recent falls.  Has sufficient food.   Left ear popping/ringing Ongoing for some time.  Not wearing hearing aids. Referred to audiology 06/2018. appt 08/05/18 at [place] [facility]. Cerumen occlusion at that time removed. Sensorineural hearing loss - presbycusis. Tinnitus thought to be due to hearing loss.  Hearing aids recommended, but not pursued at that time.   Visual difficulty: Some difficulty with small print/numbers. Evaluated by eye specialist previously - [place] or [facility].  Wears glasses some times. unknonw last appt with eye specialist.    Patient Active Problem List   Diagnosis Date Noted  . Low back pain 08/03/2019  . Acute pain of left  shoulder 10/10/2018  . Chest pain 11/08/2017  . Memory difficulty 03/24/2016  . Hereditary and idiopathic peripheral neuropathy 03/24/2016  . Abnormality of gait 03/24/2016  . Peripheral neuropathy 04/18/2015  . Hyperlipidemia 04/18/2015  . Prostate cancer ([place]) 08/24/2014  . Right inguinal hernia 10/30/2013   Past Medical History:  Diagnosis Date  . Abnormality of gait 03/24/2016  . Anxiety   . Cancer ([place])   . Cataract   . Depression   . Heart murmur   . Hereditary and idiopathic peripheral neuropathy 03/24/2016  . Hypertension   . Memory difficulty 03/24/2016  . Neuromuscular disorder ([place])   . Neuropathy   . Prostate cancer ([place])    remission   Past Surgical History:  Procedure Laterality Date  . CATARACT EXTRACTION Bilateral   . EYE SURGERY    . HERNIA REPAIR  1985  . INGUINAL HERNIA REPAIR Right 12/06/2013   Procedure: HERNIA REPAIR INGUINAL ADULT;  Surgeon: Merrie Roof, MD;  Location: [place];  Service: General;  Laterality: Right;  . INSERTION OF MESH Right 12/06/2013   Procedure: INSERTION OF MESH;  Surgeon: Merrie Roof, MD;  Location: [facility];  Service: General;  Laterality: Right;  . PROSTATE SURGERY     Allergies  Allergen Reactions  . Lyrica [Pregabalin] Other (See Comments)    "constipation"   Prior to Admission medications   Medication Sig Start Date End Date Taking? Authorizing Provider  amLODipine (NORVASC) 5 MG tablet TAKE 1  TABLET BY MOUTH EVERY DAY 07/31/19  Yes [name], MD  ammonium lactate (AMLACTIN) 12 % lotion Apply to both feet twice daily for dry skin 05/25/19  Yes [place], Stephani Police, DPM  aspirin EC 81 MG tablet Take 81 mg by mouth daily. Reported on 05/14/2016   Yes [provider]  Calcium Carb-Cholecalciferol (CALCIUM 600 + D PO) Take 1 tablet by mouth daily.   Yes [provider]  DULoxetine (CYMBALTA) 30 MG capsule Take 1 capsule (30 mg total) by mouth daily. 05/25/19  Yes Kathrynn Ducking, MD  ERLEADA 60 MG  tablet  06/06/18  Yes [provider]  gabapentin (NEURONTIN) 600 MG tablet TAKE 2 TABLET BY MOUTH IN MORNING AND 2 TABLET AT MIDDAY AND 1 TABLET IN EVENING 05/30/19  Yes [name], MD  hydrochlorothiazide (HYDRODIURIL) 25 MG tablet Take 1 tablet (25 mg total) by mouth daily. 07/17/19  Yes [name], MD  Leuprolide Acetate (LUPRON IJ) Inject 1 application as directed every 6 (six) months.   Yes [provider]  RAPAFLO 8 MG CAPS capsule  11/09/17  Yes [provider]  simvastatin (ZOCOR) 40 MG tablet TAKE 1 TABLET BY MOUTH ONCE DAILY 08/17/19  Yes [name], MD  tamsulosin (FLOMAX) 0.4 MG CAPS capsule Take 0.4 mg daily by mouth. Reported on 05/14/2016 08/17/15  Yes [provider]   Social History   Socioeconomic History  . Marital status: Widowed    Spouse name: Not on file  . Number of children: Not on file  . Years of education: Not on file  . Highest education level: Not on file  Occupational History  . Not on file  Social Needs  . Financial resource strain: Not on file  . Food insecurity    Worry: Never true    Inability: Never true  . Transportation needs    Medical: No    Non-medical: No  Tobacco Use  . Smoking status: Former Research scientist (life sciences)  . Smokeless tobacco: Never Used  Substance and Sexual Activity  . Alcohol use: No  . Drug use: No  . Sexual activity: Not on file  Lifestyle  . Physical activity    Days per week: Not on file    Minutes per session: Not on file  . Stress: Not on file  Relationships  . Social connections    Talks on phone: More than three times a week    Gets together: More than three times a week    Attends religious service: More than 4 times per year    Active member of club or organization: Yes    Attends meetings of clubs or organizations: More than 4 times per year    Relationship status: Widowed  . Intimate partner violence    Fear of current or ex partner: Not on file    Emotionally abused:  Not on file    Physically abused: Not on file    Forced sexual activity: Not on file  Other Topics Concern  . Not on file  Social History Narrative   Lives home alone.  Retired.  Widowed.  % children.        Review of Systems     Objective:   Physical Exam Vitals signs reviewed.  Constitutional:      General: He is not in acute distress.    Appearance: Normal appearance. He is well-developed. He is not ill-appearing.     Comments: Examined in wheelchair  HENT:  Head: Normocephalic and atraumatic.     Right Ear: External ear normal. There is impacted cerumen.     Left Ear: External ear normal. There is impacted cerumen.  Eyes:     Pupils: Pupils are equal, round, and reactive to light.  Neck:     Vascular: No carotid bruit or JVD.  Cardiovascular:     Rate and Rhythm: Normal rate and regular rhythm.     Heart sounds: Normal heart sounds. No murmur.  Pulmonary:     Effort: Pulmonary effort is normal.     Breath sounds: Normal breath sounds. No rales.  Musculoskeletal:     Right lower leg: No edema.     Left lower leg: No edema.  Skin:    General: Skin is warm and dry.  Neurological:     Mental Status: He is alert.  Psychiatric:        Mood and Affect: Mood normal.    Vitals:   10/05/19 1054  BP: 124/65  Pulse: 70  Temp: 98.6 F (37 C)  TempSrc: Oral  SpO2: 98%       Assessment & Plan:  BRISCO BOLEYN is a 85 y.o. male Physical deconditioning - Plan: Ambulatory referral to Physical Therapy DDD (degenerative disc disease), lumbar - Plan: Ambulatory referral to Physical Therapy Weakness of left leg - Plan: Ambulatory referral to Physical Therapy  PT has been helpful.  Still some deconditioning, will extend PT referral for continued treatment.  -Now followed by Meals on Wheels and other resources above.  Denies acute needs at this time.  Need for prophylactic vaccination and inoculation against influenza - Plan: Flu Vaccine QUAD High Dose(Fluad)    Hearing difficulty of both ears - Plan: Ear wax removal, Ambulatory referral to Audiology Tinnitus, left ear - Plan: Ambulatory referral to Audiology Bilateral impacted cerumen - Plan: Ear wax removal  -Improved after lavage of both ears.  Still some chronic sensorineural loss.  Refer back to audiology to decide on hearing aids.  Difficulty reading due to visual problem  -Stressed importance of using glasses, but follow-up with eye care provider.  Recheck 2 months to check status and repeat labs.  No orders of the defined types were placed in this encounter.  Patient Instructions   No change in medicines at this time.  I will reorder physical therapy as I think that will continue to be helpful.  I did place another referral to audiology so they can evaluate for hearing aids.   Earwax was removed today which should help with hearing some as well.   Follow-up with eye doctor to discuss difficulty with vision, but make sure to wear the corrective lenses as needed throughout the day.     If you have lab work done today you will be contacted with your lab results within the next 2 weeks.  If you have not heard from [place] then please contact us. The fastest way to get your results is to register for My Chart.   IF you received an x-ray today, you will receive an invoice from [facility]. Please contact [facility] at [phone] with questions or concerns regarding your invoice.   IF you received labwork today, you will receive an invoice from [place]. Please contact [facility] at [phone] with questions or concerns regarding your invoice.   Our billing staff will not be able to assist you with questions regarding bills from these companies.  You will be contacted with the lab results as soon as they  are available. The fastest way to get your results is to activate your My Chart account. Instructions are located on the last page of this paperwork. If you have not heard from  [place] regarding the results in 2 weeks, please contact this office.       Signed,   Merri Ray, MD [facility] [place].  10/05/19 12:48 PM

## 2019-10-06 ENCOUNTER — Telehealth: Payer: Self-pay | Admitting: Family Medicine

## 2019-10-06 NOTE — Telephone Encounter (Signed)
Pt has been discharged as 10.07.2020 Please call Shree at [name] asap needing clarification on referral [phone]

## 2019-10-07 ENCOUNTER — Encounter: Payer: Self-pay | Admitting: Family Medicine

## 2019-10-09 DIAGNOSIS — C61 Malignant neoplasm of prostate: Secondary | ICD-10-CM | POA: Diagnosis not present

## 2019-10-10 NOTE — Telephone Encounter (Signed)
LVM to call office back about questions.

## 2019-10-12 ENCOUNTER — Telehealth: Payer: Self-pay | Admitting: Family Medicine

## 2019-10-12 NOTE — Telephone Encounter (Signed)
Will contact pt and make sure he has scheduled an appointment.

## 2019-10-12 NOTE — Telephone Encounter (Signed)
Pt was asked to call back and tell Arlis Porta which eye dr he saw  [place]

## 2019-10-30 DIAGNOSIS — H903 Sensorineural hearing loss, bilateral: Secondary | ICD-10-CM | POA: Diagnosis not present

## 2019-10-30 DIAGNOSIS — H9312 Tinnitus, left ear: Secondary | ICD-10-CM | POA: Diagnosis not present

## 2019-10-30 DIAGNOSIS — Z570 Occupational exposure to noise: Secondary | ICD-10-CM | POA: Diagnosis not present

## 2019-10-31 ENCOUNTER — Other Ambulatory Visit: Payer: Self-pay | Admitting: *Deleted

## 2019-10-31 NOTE — Patient Outreach (Signed)
[facility]) Care Management  [place]  10/31/2019   EWART PANDE [birth year] [id number]  [place] telephone call to patient.  [place] compliance verified. Per patient he is doing good.Patient does not have a home monitor. Patient ambulates with a cane or walker. He has not had any falls. Patient has completed physical therapy  but has a home exercise routine. Patient has agreed to follow up outreach calls.    Encounter Medications:  Outpatient Encounter Medications as of 10/31/2019  Medication Sig  . amLODipine (NORVASC) 5 MG tablet TAKE 1 TABLET BY MOUTH EVERY DAY  . ammonium lactate (AMLACTIN) 12 % lotion Apply to both feet twice daily for dry skin  . aspirin EC 81 MG tablet Take 81 mg by mouth daily. Reported on 05/14/2016  . Calcium Carb-Cholecalciferol (CALCIUM 600 + D PO) Take 1 tablet by mouth daily.  . DULoxetine (CYMBALTA) 30 MG capsule Take 1 capsule (30 mg total) by mouth daily.  . ERLEADA 60 MG tablet   . gabapentin (NEURONTIN) 600 MG tablet TAKE 2 TABLET BY MOUTH IN MORNING AND 2 TABLET AT MIDDAY AND 1 TABLET IN EVENING  . hydrochlorothiazide (HYDRODIURIL) 25 MG tablet Take 1 tablet (25 mg total) by mouth daily.  Marland Kitchen Leuprolide Acetate (LUPRON IJ) Inject 1 application as directed every 6 (six) months.  Marland Kitchen RAPAFLO 8 MG CAPS capsule   . simvastatin (ZOCOR) 40 MG tablet TAKE 1 TABLET BY MOUTH ONCE DAILY  . tamsulosin (FLOMAX) 0.4 MG CAPS capsule Take 0.4 mg daily by mouth. Reported on 05/14/2016   No facility-administered encounter medications on file as of 10/31/2019.     Functional Status:  In your present state of health, do you have any difficulty performing the following activities: 10/31/2019 08/08/2019  Hearing? Y N  Comment patient had hearing test and looking at getting hearing aid -  Vision? N N  Difficulty concentrating or making decisions? N N  Walking or climbing stairs? Y Y  Comment uses cane or walker back problems  Dressing or  bathing? N N  Doing errands, shopping? Tempie Donning  Comment friends take patient to do shopping depends on daughter and friend  Conservation officer, nature and eating ? Y N  Comment Friends bring meals and meals on wheels -  Using the Toilet? N N  In the past six months, have you accidently leaked urine? Y N  Do you have problems with loss of bowel control? N N  Managing your Medications? N N  Managing your Finances? N N  Housekeeping or managing your Housekeeping? N N  Some recent data might be hidden    Fall/Depression Screening: Fall Risk  10/31/2019 10/05/2019 08/08/2019  Falls in the past year? 0 0 0  Comment - - -  Number falls in past yr: 0 0 0  Comment - - -  Injury with Fall? 0 0 -  Risk Factor Category  - - -  Risk for fall due to : - - -  Follow up Falls evaluation completed;Education provided;Falls prevention discussed Falls evaluation completed -   PHQ 2/9 Scores 10/31/2019 10/05/2019 08/31/2019 08/07/2019 07/17/2019 06/14/2019 05/31/2019  PHQ - 2 Score 0 0 0 0 0 0 0   THN CM Care Plan Problem One     Most Recent Value  Care Plan Problem One  Knowledge Deficit in Self Management of Hypertension  Role Documenting the Problem One  [place] for Problem One  Active  [facility] Long  Term Goal   Patient will monitor blood pressure at home and document within the next 90 days  THN Long Term Goal Start Date  10/31/19  Interventions for Problem One Long Term Goal  RN discussed monitoring blood pressure at home.RN sent a blood pressure monitor. RN sent a picture sheet on how to check your blood pressure.RN will follow up with further discussion  THN CM Short Term Goal #1   Patient will report montoring his salt intake within the next 30 days  THN CM Short Term Goal #1 Start Date  10/31/19  Interventions for Short Term Goal #1  RN disscussed low salt diet. RN sent a picture sheet of foods high and low in sodium. RN will follow up with fuether discussion  THN CM Short Term Goal #2   Patient will  follow upHealth Maintenance within the next 30 days  THN CM Short Term Goal #2 Start Date  10/31/19  Interventions for Short Term Goal #2  RN disucssed health maintenance. RN discussed eye exam and foolow up health care. RN will follow up for compliance       Assessment:  Patient does not have a blood pressure monitor Patient is unable to check blood pressure Patient receives meals on wheels Patient will benefit from [place] telephonic outreach for education and support for hypertension self management.  Plan:  RN discussed with patient about monitoring RN sent blood pressure monitor RN sent educational material on how to use the blood pressure monitor RN discussed low sodium diet RN sent a picture sheet on foods high and low in sodium RN talked about medication adherence RN sent barriers letter and assessment to PCP RN will follow up within the month of January  Makeila Yamaguchi [facility] [phone]

## 2019-11-26 NOTE — Progress Notes (Signed)
PATIENT: Casey Fisher DOB: September 29, 1928  REASON FOR VISIT: follow up HISTORY FROM: patient  HISTORY OF PRESENT ILLNESS: Today 11/27/19  Casey Fisher is an 85 year old male with history of a peripheral neuropathy associated with a gait disorder.  He did not increase his dose of Cymbalta after last visit, is still taking 30 mg once a day.  He remains on gabapentin 1200 mg in the morning, 1200 mg midday, 600 mg in the evening.  He is not taking any medications for his memory.  He had an epidural steroid injection in August 2020 for back pain.  He continues to complain of numbness in both lower extremities.  He indicates he sleeps well.  He uses a cane or walker to ambulate.  He has not had any recent falls.  He has 2 neighbors who check on him daily.  He receives Meals on Wheels.  He does manage his medications.  His daughter manages his finances.  He presents today for follow-up accompanied by his friend, Casey Fisher.  HISTORY 05/25/2019 Dr. Jannifer Fisher: Casey Fisher is an 85 year old right-handed black male with a history of a peripheral neuropathy associated with a gait disorder.  He claims that he has not fallen recently, his last fall was 8 months ago.  He indicates that he did get physical therapy in the home following the fall, he has now been more careful about his walking, he does have a cane and a walker.  The patient does operate a motor vehicle.  He lives alone at home, he does have a daughter who lives in [place], [place] who helps out some with him.  The patient continues to have some discomfort in the legs and feet, he remains on gabapentin 1200 mg twice during the day and 600 mg at night.  He takes 30 mg of Cymbalta daily.  He in the past has not wanted to go on any medications for his memory.   REVIEW OF SYSTEMS: Out of a complete 14 system review of symptoms, the patient complains only of the following symptoms, and all other reviewed systems are negative.  Memory loss,  neuropathy  ALLERGIES: Allergies  Allergen Reactions  . Lyrica [Pregabalin] Other (See Comments)    "constipation"    HOME MEDICATIONS: Outpatient Medications Prior to Visit  Medication Sig Dispense Refill  . amLODipine (NORVASC) 5 MG tablet TAKE 1 TABLET BY MOUTH EVERY DAY 30 tablet 1  . ammonium lactate (AMLACTIN) 12 % lotion Apply to both feet twice daily for dry skin 396 g 4  . aspirin EC 81 MG tablet Take 81 mg by mouth daily. Reported on 05/14/2016    . Calcium Carb-Cholecalciferol (CALCIUM 600 + D PO) Take 1 tablet by mouth daily.    . DULoxetine (CYMBALTA) 30 MG capsule Take 1 capsule (30 mg total) by mouth daily. 60 capsule 5  . ERLEADA 60 MG tablet     . gabapentin (NEURONTIN) 600 MG tablet TAKE 2 TABLET BY MOUTH IN MORNING AND 2 TABLET AT MIDDAY AND 1 TABLET IN EVENING 450 tablet 1  . hydrochlorothiazide (HYDRODIURIL) 25 MG tablet Take 1 tablet (25 mg total) by mouth daily. 90 tablet 1  . Leuprolide Acetate (LUPRON IJ) Inject 1 application as directed every 6 (six) months.    Marland Kitchen RAPAFLO 8 MG CAPS capsule   0  . simvastatin (ZOCOR) 40 MG tablet TAKE 1 TABLET BY MOUTH ONCE DAILY 90 tablet 0  . tamsulosin (FLOMAX) 0.4 MG CAPS capsule Take 0.4 mg daily  by mouth. Reported on 05/14/2016  1   No facility-administered medications prior to visit.     PAST MEDICAL HISTORY: Past Medical History:  Diagnosis Date  . Abnormality of gait 03/24/2016  . Anxiety   . Cancer ([place])   . Cataract   . Depression   . Heart murmur   . Hereditary and idiopathic peripheral neuropathy 03/24/2016  . Hypertension   . Memory difficulty 03/24/2016  . Neuromuscular disorder ([place])   . Neuropathy   . Prostate cancer ([place])    remission    PAST SURGICAL HISTORY: Past Surgical History:  Procedure Laterality Date  . CATARACT EXTRACTION Bilateral   . EYE SURGERY    . HERNIA REPAIR  1985  . INGUINAL HERNIA REPAIR Right 12/06/2013   Procedure: HERNIA REPAIR INGUINAL ADULT;  Surgeon: Merrie Roof,  MD;  Location: [facility];  Service: General;  Laterality: Right;  . INSERTION OF MESH Right 12/06/2013   Procedure: INSERTION OF MESH;  Surgeon: Merrie Roof, MD;  Location: [facility];  Service: General;  Laterality: Right;  . PROSTATE SURGERY      FAMILY HISTORY: Family History  Problem Relation Age of Onset  . Brain cancer Daughter   . Heart disease Mother   . Stroke Mother   . Heart disease Father     SOCIAL HISTORY: Social History   Socioeconomic History  . Marital status: Widowed    Spouse name: Not on file  . Number of children: Not on file  . Years of education: Not on file  . Highest education level: Not on file  Occupational History  . Not on file  Social Needs  . Financial resource strain: Not on file  . Food insecurity    Worry: Never true    Inability: Never true  . Transportation needs    Medical: No    Non-medical: No  Tobacco Use  . Smoking status: Former Research scientist (life sciences)  . Smokeless tobacco: Never Used  Substance and Sexual Activity  . Alcohol use: No  . Drug use: No  . Sexual activity: Not on file  Lifestyle  . Physical activity    Days per week: Not on file    Minutes per session: Not on file  . Stress: Not on file  Relationships  . Social connections    Talks on phone: More than three times a week    Gets together: More than three times a week    Attends religious service: More than 4 times per year    Active member of club or organization: Yes    Attends meetings of clubs or organizations: More than 4 times per year    Relationship status: Widowed  . Intimate partner violence    Fear of current or ex partner: Not on file    Emotionally abused: Not on file    Physically abused: Not on file    Forced sexual activity: Not on file  Other Topics Concern  . Not on file  Social History Narrative   Lives home alone.  Retired.  Widowed.  % children.     PHYSICAL EXAM  Vitals:   11/27/19 1422  BP: (!) 157/76  Pulse: 60  Temp: 97.8 F (36.6 C)   Weight: 171 lb 12.8 oz (77.9 kg)  Height: 6' 2.5" (1.892 m)   Body mass index is 21.76 kg/m.  Generalized: Well developed, in no acute distress  MMSE - Mini Mental State Exam 11/27/2019 05/25/2019 08/23/2018  Orientation to time  2 4 3   Orientation to Place 4 5 5   Registration 3 3 3   Attention/ Calculation 1 1 2   Recall 2 2 1   Language- name 2 objects 2 2 2   Language- repeat 0 1 1  Language- follow 3 step command 3 3 2   Language- read & follow direction 1 0 1  Write a sentence 0 0 0  Copy design 0 0 0  Total score 18 21 20     Neurological examination  Mentation: Alert oriented to time, place, history taking. Follows all commands speech and language fluent Cranial nerve II-XII: Pupils were equal round reactive to light. Extraocular movements were full, visual field were full on confrontational test. Facial sensation and strength were normal. Head turning and shoulder shrug  were normal and symmetric. Motor: Good strength of all extremities Sensory: Sensory testing is intact to soft touch on all 4 extremities. No evidence of extinction is noted.  Coordination: Cerebellar testing reveals good finger-nose-finger and heel-to-shin bilaterally.  Gait and station: He has a stooped posture, shuffling gait, able to ambulate short distance in room with standby assist, gait is slow and cautious Reflexes: Deep tendon reflexes are symmetric   DIAGNOSTIC DATA (LABS, IMAGING, TESTING) - I reviewed patient records, labs, notes, testing and imaging myself where available.  Lab Results  Component Value Date   WBC 5.4 11/08/2017   HGB 12.1 (L) 11/08/2017   HCT 38.5 (L) 11/08/2017   MCV 88.7 11/08/2017   PLT 171 11/08/2017      Component Value Date/Time   NA 143 06/14/2019 1052   K 3.8 06/14/2019 1052   CL 107 (H) 06/14/2019 1052   CO2 23 06/14/2019 1052   GLUCOSE 88 06/14/2019 1052   GLUCOSE 97 11/08/2017 0347   BUN 13 06/14/2019 1052   CREATININE 0.96 06/14/2019 1052   CREATININE 0.95  03/11/2016 1407   CALCIUM 9.3 06/14/2019 1052   PROT 7.3 06/14/2019 1052   ALBUMIN 4.4 06/14/2019 1052   AST 16 06/14/2019 1052   ALT 9 06/14/2019 1052   ALKPHOS 76 06/14/2019 1052   BILITOT 0.4 06/14/2019 1052   GFRNONAA 70 06/14/2019 1052   GFRNONAA 72 03/11/2016 1407   GFRAA 81 06/14/2019 1052   GFRAA 83 03/11/2016 1407   Lab Results  Component Value Date   CHOL 164 06/14/2019   HDL 59 06/14/2019   LDLCALC 93 06/14/2019   TRIG 62 06/14/2019   CHOLHDL 2.8 06/14/2019   Lab Results  Component Value Date   HGBA1C 5.4 04/06/2014   Lab Results  Component Value Date   VITAMINB12 395 03/24/2016   Lab Results  Component Value Date   TSH 4.020 08/07/2019   ASSESSMENT AND PLAN 85 y.o. year old male  has a past medical history of Abnormality of gait (03/24/2016), Anxiety, Cancer ([place]), Cataract, Depression, Heart murmur, Hereditary and idiopathic peripheral neuropathy (03/24/2016), Hypertension, Memory difficulty (03/24/2016), Neuromuscular disorder ([facility]), Neuropathy, and Prostate cancer ([place]). here with:  1.  Memory disturbance 2.  Peripheral neuropathy 3.  Gait disturbance  I will increase his Cymbalta to 30 mg twice a day.  He will remain on gabapentin, as prescribed by his primary doctor.  He does not wish to go on any medications for memory.  He continues to live alone, appears to do fairly well at this point on his own.  He will follow-up in 6 months or sooner if needed.  I did advise if his symptoms worsen or if he develops any new symptoms he should let  us know.  I spent 15 minutes with the patient. 50% of this time was spent discussing his plan of care.   [name] Denmark, AGNP-C, DNP 11/27/2019, 2:30 PM [facility] [address], [place], [place] [address] [phone]

## 2019-11-27 ENCOUNTER — Encounter: Payer: Self-pay | Admitting: Neurology

## 2019-11-27 ENCOUNTER — Ambulatory Visit (INDEPENDENT_AMBULATORY_CARE_PROVIDER_SITE_OTHER): Payer: Medicare Other | Admitting: Neurology

## 2019-11-27 ENCOUNTER — Other Ambulatory Visit: Payer: Self-pay

## 2019-11-27 VITALS — BP 157/76 | HR 60 | Temp 97.8°F | Ht 74.5 in | Wt 171.8 lb

## 2019-11-27 DIAGNOSIS — G609 Hereditary and idiopathic neuropathy, unspecified: Secondary | ICD-10-CM | POA: Diagnosis not present

## 2019-11-27 DIAGNOSIS — R269 Unspecified abnormalities of gait and mobility: Secondary | ICD-10-CM

## 2019-11-27 DIAGNOSIS — R413 Other amnesia: Secondary | ICD-10-CM | POA: Diagnosis not present

## 2019-11-27 MED ORDER — DULOXETINE HCL 30 MG PO CPEP: 30.0000 mg | ORAL_CAPSULE | Freq: Two times a day (BID) | ORAL | 1 refills | Status: DC

## 2019-11-27 NOTE — Patient Instructions (Addendum)
Increase Cymbalta 30 mg twice a day. Continue gabapentin. See you back in 6 months :)  Memory score was 18/30 today.

## 2019-11-28 ENCOUNTER — Telehealth: Payer: Self-pay | Admitting: Neurology

## 2019-11-28 MED ORDER — DONEPEZIL HCL 5 MG PO TABS: 5.0000 mg | ORAL_TABLET | Freq: Every day | ORAL | 3 refills | Status: DC

## 2019-11-28 NOTE — Telephone Encounter (Signed)
Please call the patient. Let's try Aricept. I will send in 5 mg tablets to take 1 at bedtime. Try this for 1 month, if he does well. I will send in 10 mg at bedtime. Please mention side effects of GI symptoms. His weight has remained relatively stable, as the medication can sometimes result in weight loss.

## 2019-11-28 NOTE — Progress Notes (Signed)
I have read the note, and I agree with the clinical assessment and plan.  Naethan Bracewell K Benay Pomeroy   

## 2019-11-28 NOTE — Addendum Note (Signed)
Addended by: Suzzanne Cloud on: 11/28/2019 08:50 PM   Modules accepted: Orders

## 2019-11-28 NOTE — Telephone Encounter (Signed)
Pt's friend(on DPR) has called to inform that pt is now willing to take the memory medication suggested by NP Sarah on yesterday.  Please have the memory medication called into [facility] [phone]

## 2019-11-29 NOTE — Telephone Encounter (Signed)
Spoke with [name] and he stated that he was going to go pick up the prescription now. He verbalized understanding the GI side effects. No other questions or concerns at this time.

## 2019-11-30 MED ORDER — DONEPEZIL HCL 5 MG PO TABS: 5.0000 mg | ORAL_TABLET | Freq: Every day | ORAL | 3 refills | Status: DC

## 2019-11-30 NOTE — Addendum Note (Signed)
Addended by: Thomes Cake on: 11/30/2019 01:34 PM   Modules accepted: Orders

## 2019-11-30 NOTE — Telephone Encounter (Signed)
Noted! Thank you

## 2019-11-30 NOTE — Telephone Encounter (Signed)
Please disregard previous message. Patient friend called to report the prescription was received and filled.  No FU necessary

## 2019-11-30 NOTE — Telephone Encounter (Signed)
1) Medication(s) Requested (by name): aricept 2) Pharmacy of Choice: [facility] [address] [facility] [address] 3) Special Requests:   Pharmacy states prescription has not been received please resend for patient

## 2019-12-06 ENCOUNTER — Other Ambulatory Visit: Payer: Self-pay

## 2019-12-06 ENCOUNTER — Other Ambulatory Visit: Payer: Self-pay | Admitting: *Deleted

## 2019-12-06 ENCOUNTER — Ambulatory Visit (INDEPENDENT_AMBULATORY_CARE_PROVIDER_SITE_OTHER): Payer: Medicare Other | Admitting: Family Medicine

## 2019-12-06 VITALS — BP 132/68 | HR 69 | Temp 97.8°F

## 2019-12-06 DIAGNOSIS — R5381 Other malaise: Secondary | ICD-10-CM | POA: Diagnosis not present

## 2019-12-06 DIAGNOSIS — Z87898 Personal history of other specified conditions: Secondary | ICD-10-CM

## 2019-12-06 DIAGNOSIS — E785 Hyperlipidemia, unspecified: Secondary | ICD-10-CM

## 2019-12-06 DIAGNOSIS — M5136 Other intervertebral disc degeneration, lumbar region: Secondary | ICD-10-CM

## 2019-12-06 DIAGNOSIS — I10 Essential (primary) hypertension: Secondary | ICD-10-CM

## 2019-12-06 DIAGNOSIS — T17308A Unspecified foreign body in larynx causing other injury, initial encounter: Secondary | ICD-10-CM | POA: Diagnosis not present

## 2019-12-06 DIAGNOSIS — G629 Polyneuropathy, unspecified: Secondary | ICD-10-CM

## 2019-12-06 MED ORDER — HYDROCHLOROTHIAZIDE 12.5 MG PO TABS: 12.5000 mg | ORAL_TABLET | Freq: Every day | ORAL | 1 refills | Status: DC

## 2019-12-06 MED ORDER — GABAPENTIN 600 MG PO TABS: ORAL_TABLET | ORAL | 1 refills | Status: DC

## 2019-12-06 NOTE — Progress Notes (Signed)
Subjective:  Patient ID: Casey Fisher, male    DOB: 07-15-1928  Age: 85 y.o. MRN: [id number]  CC:  Chief Complaint  Patient presents with  . Medical Management of Chronic Issues    Here today for deconditioning and labs.Weakness in legs    HPI Casey Fisher   Here with friend.   Follow-up from October 8.  Physical deconditioning, left leg weakness with history of lumbar degenerative disc disease, peripheral neuropathy, followed by neurology and back specialist.    Neuropathy has been treated with gabapentin 1200 mg in the morning, 1200 mg in the afternoon, 600 mg at bedtime. Recently evaluated November 30 with neurology for his neuropathy, gait disturbance and memory disturbance.  Cymbalta was increased to 30 mg twice daily, declined medication for memory initially, then decided to start Aricept at follow-up call.  Started on 5 mg nightly. Friend noted him having hallucination Sunday. Not in past few days. Has not discussed with neurology.   He was referred to physical therapy previously, that had been helpful so extended PT referral for continued treatment.  Followed by [facility] health coach previously, Meals on Wheels started last visit.  Was using cane or walker at home without recent falling.  Denied food insecurity. Has not heard call about PT Friends check on him, but lives alone - asking about home health aide. check on him, meds, etc.  Last [facility] care mgt call on 11/3.   He was referred back to audiology to discuss hearing aids at last visit as well as importance of using glasses and follow-up with his eye care provider with difficulty reading. Met with audiologist - has not received hearing aids, not sure of plan.   Choking spell: Last Saturday. Eating grits. Called EMS, but no LOC, stopped on own.  Has had 3 total episodes of gagging when talking to friend.  No fevers, no new cough, no shortness of breath.  Hypertension: Amlodipine 5 mg daily, hydrochlorothiazide 25 mg  daily.  Amlodipine had been discontinued in past with LE edema.  It appears he is taking hydrochlorothiazide only "pink pill" - not sure of when it was taken. Been a few days.  Home readings: none.  BP Readings from Last 3 Encounters:  12/06/19 132/68  11/27/19 (!) 157/76  10/05/19 124/65   Lab Results  Component Value Date   CREATININE 1.03 12/06/2019    Hyperlipidemia: Simvastatin 40 mg daily. No new myalgias. Same back pain.  Lab Results  Component Value Date   CHOL 203 (H) 12/06/2019   HDL 60 12/06/2019   LDLCALC 131 (H) 12/06/2019   TRIG 69 12/06/2019   CHOLHDL 3.4 12/06/2019   Lab Results  Component Value Date   ALT 7 12/06/2019   AST 16 12/06/2019   ALKPHOS 88 12/06/2019   BILITOT 0.3 12/06/2019    History Patient Active Problem List   Diagnosis Date Noted  . Low back pain 08/03/2019  . Acute pain of left shoulder 10/10/2018  . Chest pain 11/08/2017  . Memory difficulty 03/24/2016  . Hereditary and idiopathic peripheral neuropathy 03/24/2016  . Abnormality of gait 03/24/2016  . Peripheral neuropathy 04/18/2015  . Hyperlipidemia 04/18/2015  . Prostate cancer ([place]) 08/24/2014  . Right inguinal hernia 10/30/2013   Past Medical History:  Diagnosis Date  . Abnormality of gait 03/24/2016  . Anxiety   . Cancer ([place])   . Cataract   . Depression   . Heart murmur   . Hereditary and idiopathic peripheral neuropathy 03/24/2016  .  Hypertension   . Memory difficulty 03/24/2016  . Neuromuscular disorder ([place])   . Neuropathy   . Prostate cancer ([place])    remission   Past Surgical History:  Procedure Laterality Date  . CATARACT EXTRACTION Bilateral   . EYE SURGERY    . HERNIA REPAIR  1985  . INGUINAL HERNIA REPAIR Right 12/06/2013   Procedure: HERNIA REPAIR INGUINAL ADULT;  Surgeon: Merrie Roof, MD;  Location: [facility];  Service: General;  Laterality: Right;  . INSERTION OF MESH Right 12/06/2013   Procedure: INSERTION OF MESH;  Surgeon: Merrie Roof, MD;   Location: [facility];  Service: General;  Laterality: Right;  . PROSTATE SURGERY     Allergies  Allergen Reactions  . Lyrica [Pregabalin] Other (See Comments)    "constipation"   Prior to Admission medications   Medication Sig Start Date End Date Taking? Authorizing Provider  amLODipine (NORVASC) 5 MG tablet TAKE 1 TABLET BY MOUTH EVERY DAY 07/31/19  Yes [name], MD  ammonium lactate (AMLACTIN) 12 % lotion Apply to both feet twice daily for dry skin 05/25/19  Yes [place], Stephani Police, DPM  aspirin EC 81 MG tablet Take 81 mg by mouth daily. Reported on 05/14/2016   Yes [provider]  Calcium Carb-Cholecalciferol (CALCIUM 600 + D PO) Take 1 tablet by mouth daily.   Yes [provider]  donepezil (ARICEPT) 5 MG tablet Take 1 tablet (5 mg total) by mouth at bedtime. 11/30/19  Yes Suzzanne Cloud, NP  DULoxetine (CYMBALTA) 30 MG capsule Take 1 capsule (30 mg total) by mouth 2 (two) times daily. 11/27/19  Yes Suzzanne Cloud, NP  ERLEADA 60 MG tablet  06/06/18  Yes [provider]  gabapentin (NEURONTIN) 600 MG tablet TAKE 2 TABLET BY MOUTH IN MORNING AND 2 TABLET AT MIDDAY AND 1 TABLET IN EVENING 05/30/19  Yes [name], MD  hydrochlorothiazide (HYDRODIURIL) 25 MG tablet Take 1 tablet (25 mg total) by mouth daily. 07/17/19  Yes [name], MD  Leuprolide Acetate (LUPRON IJ) Inject 1 application as directed every 6 (six) months.   Yes [provider]  RAPAFLO 8 MG CAPS capsule  11/09/17  Yes [provider]  simvastatin (ZOCOR) 40 MG tablet TAKE 1 TABLET BY MOUTH ONCE DAILY 08/17/19  Yes [name], MD  tamsulosin (FLOMAX) 0.4 MG CAPS capsule Take 0.4 mg daily by mouth. Reported on 05/14/2016 08/17/15  Yes [provider]   Social History   Socioeconomic History  . Marital status: Widowed    Spouse name: Not on file  . Number of children: Not on file  . Years of education: Not on file  . Highest education level: Not on file   Occupational History  . Not on file  Tobacco Use  . Smoking status: Former Research scientist (life sciences)  . Smokeless tobacco: Never Used  Substance and Sexual Activity  . Alcohol use: No  . Drug use: No  . Sexual activity: Not on file  Other Topics Concern  . Not on file  Social History Narrative   Lives home alone.  Retired.  Widowed.  % children.     Social Determinants of Health   Financial Resource Strain:   . Difficulty of Paying Living Expenses: Not on file  Food Insecurity: No Food Insecurity  . Worried About Charity fundraiser in the Last Year: Never true  . Ran Out of Food in the Last Year: Never true  Transportation  Needs: No Transportation Needs  . Lack of Transportation (Medical): No  . Lack of Transportation (Non-Medical): No  Physical Activity:   . Days of Exercise per Week: Not on file  . Minutes of Exercise per Session: Not on file  Stress:   . Feeling of Stress : Not on file  Social Connections: Slightly Isolated  . Frequency of Communication with Friends and Family: More than three times a week  . Frequency of Social Gatherings with Friends and Family: More than three times a week  . Attends Religious Services: More than 4 times per year  . Active Member of Clubs or Organizations: Yes  . Attends Archivist Meetings: More than 4 times per year  . Marital Status: Widowed  Intimate Partner Violence:   . Fear of Current or Ex-Partner: Not on file  . Emotionally Abused: Not on file  . Physically Abused: Not on file  . Sexually Abused: Not on file    Review of Systems Per HPI  Objective:   Vitals:   12/06/19 1131  BP: 132/68  Pulse: 69  Temp: 97.8 F (36.6 C)  TempSrc: Temporal  SpO2: 97%     Physical Exam Vitals reviewed.  Constitutional:      Appearance: He is well-developed.  HENT:     Head: Normocephalic and atraumatic.  Eyes:     Pupils: Pupils are equal, round, and reactive to light.  Neck:     Vascular: No carotid bruit or JVD.   Cardiovascular:     Rate and Rhythm: Normal rate and regular rhythm.     Heart sounds: Normal heart sounds. No murmur.  Pulmonary:     Effort: Pulmonary effort is normal.     Breath sounds: Normal breath sounds. No rales.  Abdominal:     Tenderness: There is no abdominal tenderness.  Skin:    General: Skin is warm and dry.  Neurological:     Mental Status: He is alert and oriented to person, place, and time.  Psychiatric:        Mood and Affect: Mood normal. Affect is flat.        Behavior: Behavior normal.        Assessment & Plan:  RAMZI BRATHWAITE is a 85 y.o. male . Choking, initial encounter - Plan: SLP eval and treat  -Acute episodes as above, no residual symptoms, no apparent aspiration and lungs are clear.  Referral to speech therapy to evaluate including probable swallowing evaluation.  Advised to to chew food slowly and thoroughly for now, ER/RTC precautions given.  Essential hypertension - Plan: hydrochlorothiazide (HYDRODIURIL) 12.5 MG tablet, Comprehensive metabolic panel  -Some concern regarding which medication he is taking.  Appears to be taking HCTZ, but with current reading off meds, will try lower dose. Pill minder recommended.   Hyperlipidemia, unspecified hyperlipidemia type - Plan: Comprehensive metabolic panel, Lipid panel  -Check labs, tolerating Zocor continue same dose.  DDD (degenerative disc disease), lumbar Physical deconditioning  -We will check into status of physical therapy as that was improving previously.  Has support with friends checking on him, but potentially could use home health aide to check on status of medications, evaluate for other home needs.  Advised the reach out to [facility] care management to start that process but I can certainly place orders as needed.  History of hallucinations  -Potentially related to new medication.  Denies current symptoms.  Recommended discussion with neurologist.  If persistent or acute worsening, repeat  evaluation discussed  as differential would include delirium.  Peripheral polyneuropathy - Plan: gabapentin (NEURONTIN) 600 MG tablet  -Stable with gabapentin, continue same doses.  Refilled.  Meds ordered this encounter  Medications  . hydrochlorothiazide (HYDRODIURIL) 12.5 MG tablet    Sig: Take 1 tablet (12.5 mg total) by mouth daily.    Dispense:  90 tablet    Refill:  1  . gabapentin (NEURONTIN) 600 MG tablet    Sig: TAKE 2 TABLET BY MOUTH IN MORNING AND 2 TABLET AT MIDDAY AND 1 TABLET IN EVENING    Dispense:  450 tablet    Refill:  1   Patient Instructions     Contact [facility] care management regarding needs at home and they can advise me about any specific orders I need to place: [facility] [phone]  I will check into status of physical therapy referral.   For blood pressure - start back on the hydrocholrothiazide at lower dose, and k eep a record of your blood pressures outside of the office daily and let me know those readings in the next week.  I do recommend a pill minder at home to make taking meds easier.  Return to the clinic or go to the nearest emergency room if any of your symptoms worsen or new symptoms occur.  Small bites and chew thoroughly. I will order speech therapy eval as well. Return to the clinic or go to the nearest emergency room if any of your symptoms worsen or new symptoms occur.  Call neurologist to discuss hallucination form last weekend, but if those symptoms return should be evaluated.     If you have lab work done today you will be contacted with your lab results within the next 2 weeks.  If you have not heard from [place] then please contact us. The fastest way to get your results is to register for My Chart.   IF you received an x-ray today, you will receive an invoice from [facility]. Please contact [facility] at [phone] with questions or concerns regarding your invoice.    IF you received labwork today, you will receive an invoice from [facility]. Please contact [facility] at [phone] with questions or concerns regarding your invoice.   Our billing staff will not be able to assist you with questions regarding bills from these companies.  You will be contacted with the lab results as soon as they are available. The fastest way to get your results is to activate your My Chart account. Instructions are located on the last page of this paperwork. If you have not heard from [place] regarding the results in 2 weeks, please contact this office.         Signed, Merri Ray, MD [facility]

## 2019-12-06 NOTE — Patient Outreach (Addendum)
[facility]) Care Management  12/06/2019  Casey Fisher 1928-05-06 [id number]   RN Health Coach received  telephone call from patient friend Casey Fisher.  Hipaa compliance verified. Per [name] stated Dr Carlota Raspberry told him to call me and discuss the patient medication system administration. [name] was fixing the pill boxes. He stated that the patient is having a hard time opening the pill box and getting mixed up on medication time administration.  Plan: RN referred to pharmacy.  [place] Care Management [phone]

## 2019-12-06 NOTE — Patient Instructions (Addendum)
   Contact [facility] care management regarding needs at home and they can advise me about any specific orders I need to place: [place] [facility] Management [phone]  I will check into status of physical therapy referral.   For blood pressure - start back on the hydrocholrothiazide at lower dose, and k eep a record of your blood pressures outside of the office daily and let me know those readings in the next week.  I do recommend a pill minder at home to make taking meds easier.  Return to the clinic or go to the nearest emergency room if any of your symptoms worsen or new symptoms occur.  Small bites and chew thoroughly. I will order speech therapy eval as well. Return to the clinic or go to the nearest emergency room if any of your symptoms worsen or new symptoms occur.  Call neurologist to discuss hallucination form last weekend, but if those symptoms return should be evaluated.     If you have lab work done today you will be contacted with your lab results within the next 2 weeks.  If you have not heard from [place] then please contact us. The fastest way to get your results is to register for My Chart.   IF you received an x-ray today, you will receive an invoice from [facility]. Please contact [facility] at [phone] with questions or concerns regarding your invoice.   IF you received labwork today, you will receive an invoice from [facility]. Please contact [facility] at [phone] with questions or concerns regarding your invoice.   Our billing staff will not be able to assist you with questions regarding bills from these companies.  You will be contacted with the lab results as soon as they are available. The fastest way to get your results is to activate your My Chart account. Instructions are located on the last page of this paperwork. If you have not heard from [place] regarding the results in 2 weeks, please contact this office.

## 2019-12-07 ENCOUNTER — Telehealth: Payer: Self-pay | Admitting: Pharmacist

## 2019-12-07 ENCOUNTER — Encounter: Payer: Self-pay | Admitting: Family Medicine

## 2019-12-07 LAB — COMPREHENSIVE METABOLIC PANEL
ALT: 7 IU/L (ref 0–44)
AST: 16 IU/L (ref 0–40)
Albumin/Globulin Ratio: 1.5 (ref 1.2–2.2)
Albumin: 4.1 g/dL (ref 3.6–4.6)
Alkaline Phosphatase: 88 IU/L (ref 39–117)
BUN/Creatinine Ratio: 17 (ref 10–24)
BUN: 17 mg/dL (ref 8–27)
Bilirubin Total: 0.3 mg/dL (ref 0.0–1.2)
CO2: 25 mmol/L (ref 20–29)
Calcium: 9 mg/dL (ref 8.6–10.2)
Chloride: 104 mmol/L (ref 96–106)
Creatinine, Ser: 1.03 mg/dL (ref 0.76–1.27)
GFR calc Af Amer: 74 mL/min/{1.73_m2} (ref >59)
GFR calc non Af Amer: 64 mL/min/{1.73_m2} (ref >59)
Globulin, Total: 2.7 g/dL (ref 1.5–4.5)
Glucose: 83 mg/dL (ref 65–99)
Potassium: 3.7 mmol/L (ref 3.5–5.2)
Sodium: 145 mmol/L — ABNORMAL HIGH (ref 134–144)
Total Protein: 6.8 g/dL (ref 6.0–8.5)

## 2019-12-07 LAB — LIPID PANEL
Chol/HDL Ratio: 3.4 ratio (ref 0.0–5.0)
Cholesterol, Total: 203 mg/dL — ABNORMAL HIGH (ref 100–199)
HDL: 60 mg/dL (ref >39)
LDL Chol Calc (NIH): 131 mg/dL — ABNORMAL HIGH (ref 0–99)
Triglycerides: 69 mg/dL (ref 0–149)
VLDL Cholesterol Cal: 12 mg/dL (ref 5–40)

## 2019-12-07 NOTE — Patient Outreach (Signed)
[facility]) Care Management  [place]   12/08/2019  Casey Fisher 30-Jun-1928 [id number]  Reason for referral: Medication Adherence  Referral source: Dr. Diona Fisher Health Coach Current insurance: Medicare/Next Gen/MedImpact  PMHx includes but not limited to:   Hyperlipidemia, peripheral neuropathy, prostate cancer, and memory issues.  Outreach:  Successful telephone call with patient and his caregiver/Casey Fisher  .  HIPAA identifiers verified.   Subjective:  Patient's caregiver said the patient reported having issues opening his weekly pill box to get his medications out due to arthritis in his hands.  Does the patient ever forget to take medication?  yes Does the patient have problems obtaining medications due to transportation?   no Does the patient have problems obtaining medications due to cost?  no  Does the patient feel that medications prescribed are effective?  yes Does the patient ever experience any side effects to the medications prescribed?  no  Objective: The ASCVD Risk score Casey Fisher DC Jr., et al., 2013) failed to calculate for the following reasons:   The 2013 ASCVD risk score is only valid for ages 56 to 82  Lab Results  Component Value Date   CREATININE 1.03 12/06/2019   CREATININE 0.96 06/14/2019   CREATININE 0.95 07/11/2018    Lab Results  Component Value Date   HGBA1C 5.4 04/06/2014    Lipid Panel     Component Value Date/Time   CHOL 203 (H) 12/06/2019 1526   TRIG 69 12/06/2019 1526   HDL 60 12/06/2019 1526   CHOLHDL 3.4 12/06/2019 1526   CHOLHDL 2.3 11/08/2017 0347   VLDL 6 11/08/2017 0347   LDLCALC 131 (H) 12/06/2019 1526    BP Readings from Last 3 Encounters:  12/06/19 132/68  11/27/19 (!) 157/76  10/05/19 124/65    Allergies  Allergen Reactions  . Lyrica [Pregabalin] Other (See Comments)    "constipation"    Medications Reviewed Today    Reviewed by Casey Fisher, DPM (Physician)  on 12/08/19 at 1130  Med List Status: <None>  Medication Order Taking? Sig Documenting Provider Last Dose Status Informant  ammonium lactate (AMLACTIN) 12 % lotion [id number] No Apply to both feet twice daily for dry skin Casey Fisher, DPM Taking Active   aspirin EC 81 MG tablet [id number] No Take 81 mg by mouth daily. Reported on 05/14/2016 [provider] Taking Active Self           Med Note Casey Fisher, Casey Fisher Oct 10, 2018 10:33 AM)    Calcium Carb-Cholecalciferol (CALCIUM 600 + D PO) [id number] No Take 1 tablet by mouth daily. [provider] Taking Active Self  donepezil (ARICEPT) 5 MG tablet [id number] No Take 1 tablet (5 mg total) by mouth at bedtime. Casey Cloud, NP Taking Active   DULoxetine (CYMBALTA) 30 MG capsule [id number] No Take 1 capsule (30 mg total) by mouth 2 (two) times daily. Casey Cloud, NP Taking Active   ERLEADA 60 MG tablet [id number] No Take 120 mg by mouth daily.  [provider] Taking Active   gabapentin (NEURONTIN) 600 MG tablet [id number] No TAKE 2 TABLET BY MOUTH IN MORNING AND 2 TABLET AT MIDDAY AND 1 TABLET IN Casey Rein, MD Taking Active   hydrochlorothiazide (HYDRODIURIL) 12.5 MG tablet [id number] No Take 1 tablet (12.5 mg total) by mouth daily. Casey Agreste, MD Taking Active   Leuprolide Acetate Casey Fisher) [id number] No Inject 1 application as directed every 6 (six) months.  [provider] Taking Active Self  simvastatin (ZOCOR) 40 MG tablet [id number] No TAKE 1 TABLET BY MOUTH ONCE DAILY Casey Agreste, MD Taking Active   tamsulosin [facility]) 0.4 MG CAPS capsule [id number] No Take 0.4 mg daily by mouth. Reported on 05/14/2016 [provider] Taking Active Self           Med Note Casey Fisher, Casey Fisher Nov 08, 2017  7:25 AM)            Assessment: Drugs sorted by system:  Neurologic/Psychologic: Duloxetine, Gabapentin,   Cardiovascular:  Aspirin, HCTZ  Simvastatin  Topical: Ammonium Lactate  Oncology: Erleada  Genitourinary: Tamsulosin, Lupron  Vitamins/Minerals/Supplements: Calcium Carbonate + Vitamin D  Medication Review Findings:  . Potential high dose-Duloxetine is usually dosed QD--patient is taking BID. Casey Fisher Kitchen Reviewed various adherence options for the patient:   o Automatic medication dispenser ($60)--he was not interested in the cost o Pill Packing through another pharmacy-[facility] does not offer-caregiver thought the patient would be uneasy with switching pharmacies  Medication Assistance Findings:  No medication assistance needs identified  Plan: . Will follow-up in 4-6 weeks..(To allow the patient time to think about adherence options.)  Casey Fisher, PharmD, [place] Clinical Pharmacist [phone]

## 2019-12-08 ENCOUNTER — Other Ambulatory Visit: Payer: Self-pay

## 2019-12-08 ENCOUNTER — Ambulatory Visit (INDEPENDENT_AMBULATORY_CARE_PROVIDER_SITE_OTHER): Payer: Medicare Other | Admitting: Podiatry

## 2019-12-08 ENCOUNTER — Encounter: Payer: Self-pay | Admitting: Podiatry

## 2019-12-08 DIAGNOSIS — B351 Tinea unguium: Secondary | ICD-10-CM

## 2019-12-08 DIAGNOSIS — M79674 Pain in right toe(s): Secondary | ICD-10-CM | POA: Diagnosis not present

## 2019-12-08 DIAGNOSIS — M79675 Pain in left toe(s): Secondary | ICD-10-CM

## 2019-12-08 DIAGNOSIS — G629 Polyneuropathy, unspecified: Secondary | ICD-10-CM

## 2019-12-08 DIAGNOSIS — L84 Corns and callosities: Secondary | ICD-10-CM

## 2019-12-11 ENCOUNTER — Telehealth: Payer: Self-pay | Admitting: Neurology

## 2019-12-11 NOTE — Telephone Encounter (Signed)
Friend to pt(on DPR-Brockington,[name]) is asking for a call from RN to discuss the side effects to the newly prescribed medication.  Please call

## 2019-12-12 NOTE — Telephone Encounter (Signed)
I called and LMVM for [name], caregiver for pt that per SS/NP hallucinations can be SE of the aricept even at low dose.  Will stop medication for now and monitor.  He is to call back if questions.

## 2019-12-12 NOTE — Telephone Encounter (Signed)
It is possible hallucinations could be side effect of Aricept, even at low dose 5 mg. Have him hold the medication for now.

## 2019-12-12 NOTE — Telephone Encounter (Signed)
I spoke to [place] and relayed that SS/NP note.  He will hold medication for now and address at a later time.

## 2019-12-12 NOTE — Telephone Encounter (Signed)
I called and LMVM for pts friend, [name] that I was returning call from yesterday.

## 2019-12-12 NOTE — Telephone Encounter (Signed)
I called Casey Fisher, caregiver back. He was asking about side effects of donezepil (pt having hallucination of seeing someone in his home).  He was not afraid, Neighbor checked on him and no one was there.  He has not had these before. (Casey Fisher sometimes states that he will be pulling his leg (joking).  I relayed that I did not see that as a SE of aricept.  Could be having progression of dementia.  I relayed to monitor, keep track of issues and if worsening behavior to call us back. He verbalized understanding.

## 2019-12-13 ENCOUNTER — Telehealth: Payer: Self-pay | Admitting: Family Medicine

## 2019-12-13 ENCOUNTER — Telehealth (INDEPENDENT_AMBULATORY_CARE_PROVIDER_SITE_OTHER): Payer: Medicare Other | Admitting: Family Medicine

## 2019-12-13 ENCOUNTER — Other Ambulatory Visit: Payer: Self-pay

## 2019-12-13 DIAGNOSIS — I10 Essential (primary) hypertension: Secondary | ICD-10-CM

## 2019-12-13 DIAGNOSIS — M5136 Other intervertebral disc degeneration, lumbar region: Secondary | ICD-10-CM

## 2019-12-13 DIAGNOSIS — Z87898 Personal history of other specified conditions: Secondary | ICD-10-CM

## 2019-12-13 DIAGNOSIS — T17308D Unspecified foreign body in larynx causing other injury, subsequent encounter: Secondary | ICD-10-CM

## 2019-12-13 DIAGNOSIS — R5381 Other malaise: Secondary | ICD-10-CM | POA: Diagnosis not present

## 2019-12-13 NOTE — Telephone Encounter (Signed)
PLEASE ADVISE ON MSG FROM DR. Carlota Raspberry

## 2019-12-13 NOTE — Telephone Encounter (Signed)
-----   Message from [name], MD sent at 12/06/2019 11:44 AM EST ----- Please check into referral form October for continuation of PT. Let me know if I need to order again or differently.

## 2019-12-13 NOTE — Telephone Encounter (Signed)
12/13/2019 - PATIENT HAD A TELEMED VISIT WITH DR. Carlota Raspberry ON Wednesday (12/13/2019). DR. Carlota Raspberry HAS REQUESTED MR. Andres Ege HAVE ANOTHER TELEMED VISIT WITH HIM IN 6 WEEKS TO RECHECK HIS HTN AND DECONDITIONING. I TRIED TO CALL AND SCHEDULE BUT HAD TO LEAVE A MESSAGE ON HIS HOME VOICE MAIL TO RETURN MY CALL. [place]

## 2019-12-13 NOTE — Telephone Encounter (Signed)
DR Carlota Raspberry has an  apt with patient in 30 mins will get the bp reading at that time

## 2019-12-13 NOTE — Progress Notes (Signed)
CC- 1 week on HTN and chocking- Patient stated his son is not  At home to tell me his bp . He stated they have been taking bp every other day and its been normal. He have been keeping a record but do not know where it is at this time. Patient said he think the chocking is much better.

## 2019-12-13 NOTE — Telephone Encounter (Signed)
Emergency contact called and said that he had the bp reading for dr.greene .   Requesting call back ASAP

## 2019-12-13 NOTE — Progress Notes (Signed)
Virtual Visit via Telephone Note 12:55 PM- left message, will call back.    I connected with [name] on 12/13/19 at 12:55 PM by telephone and verified that I am speaking with the correct person using two identifiers.   I discussed the limitations, risks, security and privacy concerns of performing an evaluation and management service by telephone and the availability of in person appointments. I also discussed with the patient that there may be a patient responsible charge related to this service. The patient expressed understanding and agreed to proceed, consent obtained  Chief complaint: HTN, choking.   History of Present Illness: Casey Fisher is a 85 y.o. male  Hypertension: Regimen discussed last visit.  Not sure if he had stopped amlodipine as had been recommended previously due to lower extremity edema.  Suspected he was taking just hydrochlorothiazide but not sure of timing of recent dose.  Restarted on hydrochlorothiazide half dose of 12.5 mg daily with home monitoring recommended. Taking hctz once per day. Has been using a pill minder- friend [name] has been helping.  Not sure if has received call from pharmacy. Consent given for me to call [name] and ask about meds.   Reports normal blood pressures when friend has been checking every day but exact readings unknown. Friend not there with him right now. No new lightheadedness/dizziness or new side effects with meds.   BP Readings from Last 3 Encounters:  12/06/19 132/68  11/27/19 (!) 157/76  10/05/19 124/65   Lab Results  Component Value Date   CREATININE 1.03 12/06/2019   Hallucination: Discussed last visit.  Has been discussed with neurology.  Thought to possibly be due to Aricept which had recently been started, recommended stopping meds for now. Not sure if he has stopped it as friend has been filling pill box.   Episode of choking Discussed last visit, occurred previous weekend when eating grits.   No syncope, able to eventually clear choking on his own.  Few episodes of gagging when he was talking to a friend and eating.  Denied new cough, dyspnea, fever after episodes above. Referral was placed to speech therapy to evaluate swallowing function.  In the interim advised to chew food slowly, thoroughly, ER precautions if recurrence. Denies any return of choking since last visit. Taking small bites. Has not had speech therapy eval, but no choking/cough/dyspnea/fever.   Low back pain with physical deconditioning: Repeat physical therapy was ordered, waiting on status of that referral.  Also recommended discussing any potential home health aide or home needs with [facility] care management.  Note reviewed from December 9, discussion regarding pillboxes/medication, referred to pharmacy. Has not heard about PT yet.   Called [name] after hours  - home BP's  145/77, HR 68, 106/58 HR 66, 139/80, HR75, 127/80. 139/79. 135/72 HR 65. 142/75, HR 55.  Only had 1 episode of hallucination.  Decided to stay on meds - spoke with friend [name] - no further symptoms.  [place] had refused hearing aids.        Patient Active Problem List   Diagnosis Date Noted  . Low back pain 08/03/2019  . Acute pain of left shoulder 10/10/2018  . Chest pain 11/08/2017  . Memory difficulty 03/24/2016  . Hereditary and idiopathic peripheral neuropathy 03/24/2016  . Abnormality of gait 03/24/2016  . Peripheral neuropathy 04/18/2015  . Hyperlipidemia 04/18/2015  . Prostate cancer ([place]) 08/24/2014  . Right inguinal hernia 10/30/2013   Past Medical History:  Diagnosis Date  .  Abnormality of gait 03/24/2016  . Anxiety   . Cancer ([place])   . Cataract   . Depression   . Heart murmur   . Hereditary and idiopathic peripheral neuropathy 03/24/2016  . Hypertension   . Memory difficulty 03/24/2016  . Neuromuscular disorder ([place])   . Neuropathy   . Prostate cancer ([place])    remission   Past Surgical History:    Procedure Laterality Date  . CATARACT EXTRACTION Bilateral   . EYE SURGERY    . HERNIA REPAIR  1985  . INGUINAL HERNIA REPAIR Right 12/06/2013   Procedure: HERNIA REPAIR INGUINAL ADULT;  Surgeon: Merrie Roof, MD;  Location: [place];  Service: General;  Laterality: Right;  . INSERTION OF MESH Right 12/06/2013   Procedure: INSERTION OF MESH;  Surgeon: Merrie Roof, MD;  Location: [place];  Service: General;  Laterality: Right;  . PROSTATE SURGERY     Allergies  Allergen Reactions  . Lyrica [Pregabalin] Other (See Comments)    "constipation"   Prior to Admission medications   Medication Sig Start Date End Date Taking? Authorizing Provider  ammonium lactate (AMLACTIN) 12 % lotion Apply to both feet twice daily for dry skin 05/25/19  Yes [place], Stephani Police, DPM  aspirin EC 81 MG tablet Take 81 mg by mouth daily. Reported on 05/14/2016   Yes [provider]  Calcium Carb-Cholecalciferol (CALCIUM 600 + D PO) Take 1 tablet by mouth daily.   Yes [provider]  donepezil (ARICEPT) 5 MG tablet Take 1 tablet (5 mg total) by mouth at bedtime. 11/30/19  Yes Suzzanne Cloud, NP  DULoxetine (CYMBALTA) 30 MG capsule Take 1 capsule (30 mg total) by mouth 2 (two) times daily. 11/27/19  Yes Suzzanne Cloud, NP  ERLEADA 60 MG tablet Take 120 mg by mouth daily.  06/06/18  Yes [provider]  gabapentin (NEURONTIN) 600 MG tablet TAKE 2 TABLET BY MOUTH IN MORNING AND 2 TABLET AT MIDDAY AND 1 TABLET IN EVENING 12/06/19  Yes [name], MD  hydrochlorothiazide (HYDRODIURIL) 12.5 MG tablet Take 1 tablet (12.5 mg total) by mouth daily. 12/06/19  Yes [name], MD  Leuprolide Acetate (LUPRON IJ) Inject 1 application as directed every 6 (six) months.   Yes [provider]  simvastatin (ZOCOR) 40 MG tablet TAKE 1 TABLET BY MOUTH ONCE DAILY 08/17/19  Yes [name], MD  tamsulosin (FLOMAX) 0.4 MG CAPS capsule Take 0.4 mg daily by mouth. Reported on 05/14/2016  08/17/15  Yes [provider]   Social History   Socioeconomic History  . Marital status: Widowed    Spouse name: Not on file  . Number of children: Not on file  . Years of education: Not on file  . Highest education level: Not on file  Occupational History  . Not on file  Tobacco Use  . Smoking status: Former Research scientist (life sciences)  . Smokeless tobacco: Never Used  Substance and Sexual Activity  . Alcohol use: No  . Drug use: No  . Sexual activity: Not on file  Other Topics Concern  . Not on file  Social History Narrative   Lives home alone.  Retired.  Widowed.  % children.     Social Determinants of Health   Financial Resource Strain:   . Difficulty of Paying Living Expenses: Not on file  Food Insecurity: No Food Insecurity  . Worried About Charity fundraiser in the Last Year: Never true  . Ran Out  of Food in the Last Year: Never true  Transportation Needs: No Transportation Needs  . Lack of Transportation (Medical): No  . Lack of Transportation (Non-Medical): No  Physical Activity:   . Days of Exercise per Week: Not on file  . Minutes of Exercise per Session: Not on file  Stress:   . Feeling of Stress : Not on file  Social Connections: Slightly Isolated  . Frequency of Communication with Friends and Family: More than three times a week  . Frequency of Social Gatherings with Friends and Family: More than three times a week  . Attends Religious Services: More than 4 times per year  . Active Member of Clubs or Organizations: Yes  . Attends Archivist Meetings: More than 4 times per year  . Marital Status: Widowed  Intimate Partner Violence:   . Fear of Current or Ex-Partner: Not on file  . Emotionally Abused: Not on file  . Physically Abused: Not on file  . Sexually Abused: Not on file     Observations/Objective:  Normal speech, no distress.  Appropriate responses to questioning.  No cough or dyspnea noted over phone call.   There were no vitals filed for  this visit. See BP above - today 142/75 HR 55.   Assessment and Plan: Essential hypertension  -Overall stable on home readings, option of full dose of HCTZ if systolics run into the [id number] or higher.  Recheck with telemedicine visit 6 weeks  Choking, subsequent encounter  -No further choking episodes, has started eating okay for tonsils pain with smaller portions, chewing food more thoroughly per discussion with his friend [name].  Speech therapy evaluation previously ordered, still may be helpful for checking swallowing function.  DDD (degenerative disc disease), lumbar Physical deconditioning  -Still checking into PT referral.  Notes reviewed with status update.  History of hallucinations  -1 episode, has not had recurrence.  Decision made to remain on Aricept but if any further hallucinations would recommend stopping that medicine as per guidance from neurology.  Recheck 6 weeks  Follow Up Instructions:   6 weeks.  I discussed the assessment and treatment plan with the patient. The patient was provided an opportunity to ask questions and all were answered. The patient agreed with the plan and demonstrated an understanding of the instructions.   The patient was advised to call back or seek an in-person evaluation if the symptoms worsen or if the condition fails to improve as anticipated.  I provided 10 minutes of non-face-to-face time during this encounter.  Signed,   Merri Ray, MD Primary [facility] at [facility].  12/13/19

## 2019-12-16 NOTE — Progress Notes (Signed)
Subjective: Casey Fisher is seen today for follow up painful, elongated, thickened toenails bilateral feet that he cannot cut. Pain interferes with daily activities. Aggravating factor includes wearing enclosed shoe gear and relieved with periodic debridement.  Medications reviewed in chart.  Allergies  Allergen Reactions  . Lyrica [Pregabalin] Other (See Comments)    "constipation"    Objective:  Vascular Examination: Capillary refill time immediate b/l.  Dorsalis pedis present b/l.  Posterior tibial pulses nonpalpable b/l.  Digital hair absent b/l.  Skin temperature gradient WNL b/l.   Dermatological Examination: Skin with normal turgor, texture and tone b/l.  Toenails 1-5 b/l discolored, thick, dystrophic with subungual debris and pain with palpation to nailbeds due to thickness of nails.  Hyperkeratotic lesion submet head 1 left foot with tenderness to palpation. No edema, no erythema, no drainage, no flocculence. Resolved callus submet head 1 right foot.  Musculoskeletal: Muscle strength 5/5 to all LE muscle groups b/l.  Bunion deformity b/l. Hammertoes 2-5 b/l.   No pain, crepitus or joint limitation noted with ROM.   Neurological Examination: Protective sensation diminished with 10 gram monofilament bilaterally.  Assessment: Painful onychomycosis toenails 1-5 b/l  Calluses submet head 1 left foot Neuropathy  Plan: 1. Toenails 1-5 b/l were debrided in length and girth without iatrogenic bleeding. Calluses pared submetatarsal head utilizing sterile scalpel blade without incident. 2. Patient to continue soft, supportive shoe gear daily. 3. Patient to report any pedal injuries to medical professional immediately. 4. Follow up 3 months.  5. Patient/POA to call should there be a concern in the interim.

## 2020-01-04 ENCOUNTER — Other Ambulatory Visit: Payer: Self-pay | Admitting: *Deleted

## 2020-01-09 ENCOUNTER — Telehealth: Payer: Self-pay | Admitting: Family Medicine

## 2020-01-09 NOTE — Telephone Encounter (Signed)
pts Daughter  Casey Fisher is calling in to follow up on request for [place] for aid to come out and assist patient. Could provider put in request for these services also needs a physical therapist   Please reach out to [place] at [phone] FR

## 2020-01-10 NOTE — Telephone Encounter (Signed)
Attempted to call pt and there was no answer. Pt sounds like he picked up the phone and was unsure what to do next.

## 2020-01-11 NOTE — Telephone Encounter (Signed)
Pt's caregiver called and said the number called was not the correct number to dial. He advised to return call to pt's daughter at [phone]

## 2020-01-12 NOTE — Patient Outreach (Signed)
[facility]) Care Management  [id number] Casey Fisher 09/17/1928 [id number]   RN Health Coach attempted follow up outreach call to patient.  Patient was unavailable. No voicemail pickup  Plan: RN will call patient again within 30 days.  [place] Care Management [phone]

## 2020-01-15 ENCOUNTER — Other Ambulatory Visit: Payer: Self-pay | Admitting: Pharmacist

## 2020-01-15 NOTE — Patient Outreach (Signed)
[facility] [facility]) Care Management  01/15/2020  Casey Fisher 28-Sep-1928 [id number]   Patient was called to follow up on medication adherence packaging. HIPAA identifiers were obtained.    Patient is an 86 year old male with multiple medical conditions including but not limited to:  Hyperlipidemia, history of prostate cancer, hyperlipidemia, and peripheral neuropathy.  Patient and his caregiver (Mr. [name]), we educated on various forms of pill boxes, pill minders and pill packing systems during our previous phone call.  Purpose of today's call was to see if they wanted to proceed with getting set up with pill packing. Mr. [name] was not with the patient when I spoke with him, so a message was left on Mr. Lyndle Herrlich voicemail.  Patient was not sure what he wanted to do.  Plan: Await a call back. Call patient/Mr. Brockington back in 3-4 weeks.  Elayne Guerin, PharmD, [place] Clinical Pharmacist [phone]

## 2020-01-17 ENCOUNTER — Other Ambulatory Visit: Payer: Self-pay | Admitting: Pharmacist

## 2020-01-17 NOTE — Patient Outreach (Signed)
[facility]) Care Management  01/17/2020  Casey Fisher May 20, 1928 [id number]   Patient's caregiver [name]), called me back. HIPAA identifiers were obtained.  Mr. [name] confirmed he is filling the patient's monthly pill box system every 2 weeks. He is also calling the patient daily to make sure he is taking his medication. A neighbor [name]) goes over the patient's home a couple of times each week and checks on him and reviews the pill box as well.  Mr. [name] said the patient has an appointment with Dr. Carlota Raspberry tomorrow.  Plan: Follow up again in 6-8 weeks Consider closing the patient's case.  Elayne Guerin, PharmD, [place] Clinical Pharmacist [phone]

## 2020-01-17 NOTE — Telephone Encounter (Signed)
I have called daughter back and we have spoken about the home health order. According to the chart, we have placed the order for home health in October and he was discharged due to meeting the goals. Daughter has reported that pt has been declining for the last couple of months. She reports that pt has been moving more slowly and had a few falls. Pt is dragging his feet more. Daughter is requesting some home health aid assistance as well since he does live alone.   Pt does have a tele-med tomorrow at 2:40pm. Pt's daughter will talk to her dad about being seen in house or as a [employer]

## 2020-01-17 NOTE — Telephone Encounter (Signed)
Attempted to call pt again. There was no answer.   Pt's family is requesting home health. Is a referral is needed? Or does pt contact [facility]?

## 2020-01-18 ENCOUNTER — Telehealth (INDEPENDENT_AMBULATORY_CARE_PROVIDER_SITE_OTHER): Payer: Medicare Other | Admitting: Family Medicine

## 2020-01-18 ENCOUNTER — Other Ambulatory Visit: Payer: Self-pay

## 2020-01-18 ENCOUNTER — Telehealth: Payer: Self-pay

## 2020-01-18 VITALS — BP 146/80 | HR 58 | Ht 74.0 in | Wt 174.0 lb

## 2020-01-18 DIAGNOSIS — Z9181 History of falling: Secondary | ICD-10-CM | POA: Diagnosis not present

## 2020-01-18 DIAGNOSIS — R269 Unspecified abnormalities of gait and mobility: Secondary | ICD-10-CM | POA: Diagnosis not present

## 2020-01-18 DIAGNOSIS — I10 Essential (primary) hypertension: Secondary | ICD-10-CM

## 2020-01-18 DIAGNOSIS — R5381 Other malaise: Secondary | ICD-10-CM | POA: Diagnosis not present

## 2020-01-18 DIAGNOSIS — G629 Polyneuropathy, unspecified: Secondary | ICD-10-CM

## 2020-01-18 DIAGNOSIS — M5136 Other intervertebral disc degeneration, lumbar region: Secondary | ICD-10-CM | POA: Diagnosis not present

## 2020-01-18 NOTE — Telephone Encounter (Signed)
I have tried to call the pt regarding the pt's tele-med APPT today with Doctor green on 3 different times with no answer. I was also unable to levee a VM for the pt. I also tried to call the family member listed on the pt's most resent HIPPA form with no answer. Pt may need to reschedule his visit.

## 2020-01-18 NOTE — Patient Instructions (Signed)
I will refer you to home physical therapy.   They can evaluate for appropriate walker and I can send an order if they decide new one is needed.  I have also requested social work evaluation to evaluate for options of home health aide.  No change in blood pressure medications for now.  I did refer you back to neurology to evaluate for any other potential testing or changes with your change in walking.  See other information below on fall prevention for now.  If any new symptoms or changes please let me know.   Fall Prevention in the Home, Adult Falls can cause injuries and can affect people from all age groups. There are many simple things that you can do to make your home safe and to help prevent falls. Ask for help when making these changes, if needed. What actions can I take to prevent falls? General instructions  Use good lighting in all rooms. Replace any light bulbs that burn out.  Turn on lights if it is dark. Use night-lights.  Place frequently used items in easy-to-reach places. Lower the shelves around your home if necessary.  Set up furniture so that there are clear paths around it. Avoid moving your furniture around.  Remove throw rugs and other tripping hazards from the floor.  Avoid walking on wet floors.  Fix any uneven floor surfaces.  Add color or contrast paint or tape to grab bars and handrails in your home. Place contrasting color strips on the first and last steps of stairways.  When you use a stepladder, make sure that it is completely opened and that the sides are firmly locked. Have someone hold the ladder while you are using it. Do not climb a closed stepladder.  Be aware of any and all pets. What can I do in the bathroom?      Keep the floor dry. Immediately clean up any water that spills onto the floor.  Remove soap buildup in the tub or shower on a regular basis.  Use non-skid mats or decals on the floor of the tub or shower.  Attach bath mats securely  with double-sided, non-slip rug tape.  If you need to sit down while you are in the shower, use a plastic, non-slip stool.  Install grab bars by the toilet and in the tub and shower. Do not use towel bars as grab bars. What can I do in the bedroom?  Make sure that a bedside light is easy to reach.  Do not use oversized bedding that drapes onto the floor.  Have a firm chair that has side arms to use for getting dressed. What can I do in the kitchen?  Clean up any spills right away.  If you need to reach for something above you, use a sturdy step stool that has a grab bar.  Keep electrical cables out of the way.  Do not use floor polish or wax that makes floors slippery. If you must use wax, make sure that it is non-skid floor wax. What can I do in the stairways?  Do not leave any items on the stairs.  Make sure that you have a light switch at the top of the stairs and the bottom of the stairs. Have them installed if you do not have them.  Make sure that there are handrails on both sides of the stairs. Fix handrails that are broken or loose. Make sure that handrails are as long as the stairways.  Install non-slip stair  treads on all stairs in your home.  Avoid having throw rugs at the top or bottom of stairways, or secure the rugs with carpet tape to prevent them from moving.  Choose a carpet design that does not hide the edge of steps on the stairway.  Check any carpeting to make sure that it is firmly attached to the stairs. Fix any carpet that is loose or worn. What can I do on the outside of my home?  Use bright outdoor lighting.  Regularly repair the edges of walkways and driveways and fix any cracks.  Remove high doorway thresholds.  Trim any shrubbery on the main path into your home.  Regularly check that handrails are securely fastened and in good repair. Both sides of any steps should have handrails.  Install guardrails along the edges of any raised decks or  porches.  Clear walkways of debris and clutter, including tools and rocks.  Have leaves, snow, and ice cleared regularly.  Use sand or salt on walkways during winter months.  In the garage, clean up any spills right away, including grease or oil spills. What other actions can I take?  Wear closed-toe shoes that fit well and support your feet. Wear shoes that have rubber soles or low heels.  Use mobility aids as needed, such as canes, walkers, scooters, and crutches.  Review your medicines with your health care provider. Some medicines can cause dizziness or changes in blood pressure, which increase your risk of falling. Talk with your health care provider about other ways that you can decrease your risk of falls. This may include working with a physical therapist or trainer to improve your strength, balance, and endurance. Where to find more information  Centers for Disease Control and Prevention, STEADI: WebmailGuide.co.za  [employer] on Aging: BrainJudge.co.uk Contact a health care provider if:  You are afraid of falling at home.  You feel weak, drowsy, or dizzy at home.  You fall at home. Summary  There are many simple things that you can do to make your home safe and to help prevent falls.  Ways to make your home safe include removing tripping hazards and installing grab bars in the bathroom.  Ask for help when making these changes in your home. This information is not intended to replace advice given to you by your health care provider. Make sure you discuss any questions you have with your health care provider. Document Revised: 11/26/2017 Document Reviewed: 07/29/2017 Elsevier Patient Education  2020 [employer].

## 2020-01-18 NOTE — Progress Notes (Signed)
Virtual Visit via phone Note - no video capability  I connected with Casey Fisher on 01/18/20 at 3:19 PM by a phone application and verified that I am speaking with the correct person using two identifiers.   I discussed the limitations, risks, security and privacy concerns of performing an evaluation and management service by telephone and the availability of in person appointments. I also discussed with the patient that there may be a patient responsible charge related to this service. The patient expressed understanding and agreed to proceed, consent obtained. Despina Hick on phone - consent obtained.  Chief complaint:  Chief Complaint  Patient presents with  . Follow-up    oHypertention.125-140 is the rang for pt's sistalic and 45-85 has been the range for pt's distalic. pt moves vary slow.  . home care    pt has lost alot of mobility and has fallen 1 time at the begining of this week and is moving vary slowly. they would like to get walkerand home health aid.care taker states the pt didn't hurt himself when he fell earlier this week.    History of Present Illness: Casey Fisher is a 86 y.o. male  History of gait abnormality, memory disturbance, peripheral neuropathy. Last neurology eval November 30.  Cymbalta increased to 30 mg twice daily, continued gabapentin 1200 mg morning, 1200 mg afternoon, 600 mg bedtime.  Option of medication for memory discussed, initially declined, then agreed on medication December 1.  Started on Aricept 5 mg.  Did have episode of possible hallucination.  Advised to hold Aricept due to possibility of side effects. He has continued aricept - no further hallucinations.  Does live in his own home but has friends that check on him.  Previously followed by [place] [facility], [employer].  Using cane or walker at home without recent fall when discussed in December.  Reports 1 fall earlier this week without injury.  Request for new walker,  home health aide.  Note reviewed from telephone call recently, with daughter [name].  Requesting home health care to come out and assist patient as well as repeat PT.  Reportedly has been declining past few months.  Moving more slowly and now with falling.  Dragging his feet more.  Per friend - missed seat when trying to sit back in it. Occurred last week.  Landed on buttocks. No hip pain, no change in walking. Bumped head,no wounds, no headache.  This was only fall recently. Prior fall in early December - [employer] there - rushed to get to door - fell backwards that time.  Denies new lightheadedness or dizzy.  Per friend - moving slower, shuffling feet more   Low back pain, history of degenerative disc disease, physical deconditioning. Evaluated by orthopedics last year.  Treated with epidural injection. He did not feel like it helped much.  Treated with home health physical therapy last year Patient had met goals and was discharged October 7.  Was lifting feet more back then. Worse now.  Using walker with 2 wheels on front, tennis balls on back. Requesting 4 wheel walker with seat. - plan for eval for PT.  No loose rugs, single large rug in living room   Hypertension: See last telemedicine visit December 16.  Previously had question regarding which medicines he was taking.  Had restarted hydrochlorothiazide at 12.5 mg daily.  Verified home readings with his friend, overall stable last visit.  On hctz 12.31m qd.  Home readings: High 149/85, 147/87  last week, 129/73, 133/73, 125/67, 146/80.  BP Readings from Last 3 Encounters:  01/18/20 (!) 146/80  12/06/19 132/68  11/27/19 (!) 157/76   Lab Results  Component Value Date   CREATININE 1.03 12/06/2019     Patient Active Problem List   Diagnosis Date Noted  . Low back pain 08/03/2019  . Acute pain of left shoulder 10/10/2018  . Chest pain 11/08/2017  . Memory difficulty 03/24/2016  . Hereditary and idiopathic peripheral  neuropathy 03/24/2016  . Abnormality of gait 03/24/2016  . Peripheral neuropathy 04/18/2015  . Hyperlipidemia 04/18/2015  . Prostate cancer ([place]) 08/24/2014  . Right inguinal hernia 10/30/2013   Past Medical History:  Diagnosis Date  . Abnormality of gait 03/24/2016  . Anxiety   . Cancer ([place])   . Cataract   . Depression   . Heart murmur   . Hereditary and idiopathic peripheral neuropathy 03/24/2016  . Hypertension   . Memory difficulty 03/24/2016  . Neuromuscular disorder ([place])   . Neuropathy   . Prostate cancer ([place])    remission   Past Surgical History:  Procedure Laterality Date  . CATARACT EXTRACTION Bilateral   . EYE SURGERY    . HERNIA REPAIR  1985  . INGUINAL HERNIA REPAIR Right 12/06/2013   Procedure: HERNIA REPAIR INGUINAL ADULT;  Surgeon: Merrie Roof, MD;  Location: [facility];  Service: General;  Laterality: Right;  . INSERTION OF MESH Right 12/06/2013   Procedure: INSERTION OF MESH;  Surgeon: Merrie Roof, MD;  Location: [facility];  Service: General;  Laterality: Right;  . PROSTATE SURGERY     Allergies  Allergen Reactions  . Lyrica [Pregabalin] Other (See Comments)    "constipation"   Prior to Admission medications   Medication Sig Start Date End Date Taking? Authorizing Provider  ammonium lactate (AMLACTIN) 12 % lotion Apply to both feet twice daily for dry skin 05/25/19  Yes [place], Stephani Police, DPM  aspirin EC 81 MG tablet Take 81 mg by mouth daily. Reported on 05/14/2016   Yes [provider]  Calcium Carb-Cholecalciferol (CALCIUM 600 + D PO) Take 1 tablet by mouth daily.   Yes [provider]  donepezil (ARICEPT) 5 MG tablet Take 1 tablet (5 mg total) by mouth at bedtime. 11/30/19  Yes Suzzanne Cloud, NP  DULoxetine (CYMBALTA) 30 MG capsule Take 1 capsule (30 mg total) by mouth 2 (two) times daily. 11/27/19  Yes Suzzanne Cloud, NP  ERLEADA 60 MG tablet Take 120 mg by mouth daily.  06/06/18  Yes [provider]  gabapentin (NEURONTIN)  600 MG tablet TAKE 2 TABLET BY MOUTH IN MORNING AND 2 TABLET AT MIDDAY AND 1 TABLET IN EVENING 12/06/19  Yes [name], MD  hydrochlorothiazide (HYDRODIURIL) 12.5 MG tablet Take 1 tablet (12.5 mg total) by mouth daily. 12/06/19  Yes [name], MD  Leuprolide Acetate (LUPRON IJ) Inject 1 application as directed every 6 (six) months.   Yes [provider]  simvastatin (ZOCOR) 40 MG tablet TAKE 1 TABLET BY MOUTH ONCE DAILY 08/17/19  Yes [name], MD  tamsulosin (FLOMAX) 0.4 MG CAPS capsule Take 0.4 mg daily by mouth. Reported on 05/14/2016 08/17/15  Yes [provider]   Social History   Socioeconomic History  . Marital status: Widowed    Spouse name: Not on file  . Number of children: Not on file  . Years of education: Not on file  . Highest education level: Not on file  Occupational History  . Not on file  Tobacco Use  . Smoking status: Former Research scientist (life sciences)  . Smokeless tobacco: Never Used  Substance and Sexual Activity  . Alcohol use: No  . Drug use: No  . Sexual activity: Not on file  Other Topics Concern  . Not on file  Social History Narrative   Lives home alone.  Retired.  Widowed.  % children.     Social Determinants of Health   Financial Resource Strain:   . Difficulty of Paying Living Expenses: Not on file  Food Insecurity: No Food Insecurity  . Worried About Charity fundraiser in the Last Year: Never true  . Ran Out of Food in the Last Year: Never true  Transportation Needs: No Transportation Needs  . Lack of Transportation (Medical): No  . Lack of Transportation (Non-Medical): No  Physical Activity:   . Days of Exercise per Week: Not on file  . Minutes of Exercise per Session: Not on file  Stress:   . Feeling of Stress : Not on file  Social Connections: Slightly Isolated  . Frequency of Communication with Friends and Family: More than three times a week  . Frequency of Social Gatherings with Friends and Family: More than three  times a week  . Attends Religious Services: More than 4 times per year  . Active Member of Clubs or Organizations: Yes  . Attends Archivist Meetings: More than 4 times per year  . Marital Status: Widowed  Intimate Partner Violence:   . Fear of Current or Ex-Partner: Not on file  . Emotionally Abused: Not on file  . Physically Abused: Not on file  . Sexually Abused: Not on file    Observations/Objective: Vitals:   01/18/20 1446  BP: (!) 146/80  Pulse: (!) 58  Weight: 174 lb (78.9 kg)  Height: '6\' 2"'  (1.88 m)   Home vitals.   Assessment and Plan: Essential hypertension  -Some variability but overall stable, continue same regimen with HCTZ for now.  DDD (degenerative disc disease), lumbar - Plan: Ambulatory referral to Neurology, Ambulatory referral to [place], Ambulatory referral to Social Work Physical deconditioning - Plan: Ambulatory referral to Neurology, Ambulatory referral to [place], Ambulatory referral to Social Work Peripheral polyneuropathy - Plan: Ambulatory referral to Neurology, Ambulatory referral to [place], Ambulatory referral to Social Work History of fall - Plan: Ambulatory referral to Neurology, Ambulatory referral to [place], Ambulatory referral to Social Work Abnormality of gait - Plan: Ambulatory referral to Social Work  -Reported decreased mobility, more dragging of feet few recent falls that appear to be more mechanical.  History of gait disorder, well asked neurology to evaluate to determine if other testing needed.  Suspect some deconditioning, along with history of DDD, lumbar.  Physical therapy previously helpful.  New order placed.  PT can also evaluate current use of walker and make specific recommendations on changes if needed after review of home situation.  -We will also have social work evaluate for home assistance options.  -Handout sent on fall precautions at home.  ER precautions given if new falls, nonmechanical falls, or  any injuries.  Denies injury with previous fall.  Follow Up Instructions:  1 month. Gait/HTN.     Patient Instructions  I will refer you to home physical therapy.   They can evaluate for appropriate walker and I can send an order if they decide new one is needed.  I have also requested social work evaluation to evaluate for  options of home health aide.  No change in blood pressure medications for now.  I did refer you back to neurology to evaluate for any other potential testing or changes with your change in walking.  See other information below on fall prevention for now.  If any new symptoms or changes please let me know.   Fall Prevention in the Home, Adult Falls can cause injuries and can affect people from all age groups. There are many simple things that you can do to make your home safe and to help prevent falls. Ask for help when making these changes, if needed. What actions can I take to prevent falls? General instructions  Use good lighting in all rooms. Replace any light bulbs that burn out.  Turn on lights if it is dark. Use night-lights.  Place frequently used items in easy-to-reach places. Lower the shelves around your home if necessary.  Set up furniture so that there are clear paths around it. Avoid moving your furniture around.  Remove throw rugs and other tripping hazards from the floor.  Avoid walking on wet floors.  Fix any uneven floor surfaces.  Add color or contrast paint or tape to grab bars and handrails in your home. Place contrasting color strips on the first and last steps of stairways.  When you use a stepladder, make sure that it is completely opened and that the sides are firmly locked. Have someone hold the ladder while you are using it. Do not climb a closed stepladder.  Be aware of any and all pets. What can I do in the bathroom?      Keep the floor dry. Immediately clean up any water that spills onto the floor.  Remove soap buildup in the  tub or shower on a regular basis.  Use non-skid mats or decals on the floor of the tub or shower.  Attach bath mats securely with double-sided, non-slip rug tape.  If you need to sit down while you are in the shower, use a plastic, non-slip stool.  Install grab bars by the toilet and in the tub and shower. Do not use towel bars as grab bars. What can I do in the bedroom?  Make sure that a bedside light is easy to reach.  Do not use oversized bedding that drapes onto the floor.  Have a firm chair that has side arms to use for getting dressed. What can I do in the kitchen?  Clean up any spills right away.  If you need to reach for something above you, use a sturdy step stool that has a grab bar.  Keep electrical cables out of the way.  Do not use floor polish or wax that makes floors slippery. If you must use wax, make sure that it is non-skid floor wax. What can I do in the stairways?  Do not leave any items on the stairs.  Make sure that you have a light switch at the top of the stairs and the bottom of the stairs. Have them installed if you do not have them.  Make sure that there are handrails on both sides of the stairs. Fix handrails that are broken or loose. Make sure that handrails are as long as the stairways.  Install non-slip stair treads on all stairs in your home.  Avoid having throw rugs at the top or bottom of stairways, or secure the rugs with carpet tape to prevent them from moving.  Choose a Loss adjuster, chartered that does not hide  the edge of steps on the stairway.  Check any carpeting to make sure that it is firmly attached to the stairs. Fix any carpet that is loose or worn. What can I do on the outside of my home?  Use bright outdoor lighting.  Regularly repair the edges of walkways and driveways and fix any cracks.  Remove high doorway thresholds.  Trim any shrubbery on the main path into your home.  Regularly check that handrails are securely fastened  and in good repair. Both sides of any steps should have handrails.  Install guardrails along the edges of any raised decks or porches.  Clear walkways of debris and clutter, including tools and rocks.  Have leaves, snow, and ice cleared regularly.  Use sand or salt on walkways during winter months.  In the garage, clean up any spills right away, including grease or oil spills. What other actions can I take?  Wear closed-toe shoes that fit well and support your feet. Wear shoes that have rubber soles or low heels.  Use mobility aids as needed, such as canes, walkers, scooters, and crutches.  Review your medicines with your health care provider. Some medicines can cause dizziness or changes in blood pressure, which increase your risk of falling. Talk with your health care provider about other ways that you can decrease your risk of falls. This may include working with a physical therapist or trainer to improve your strength, balance, and endurance. Where to find more information  Centers for Disease Control and Prevention, STEADI: WebmailGuide.co.za  [employer] on Aging: BrainJudge.co.uk Contact a health care provider if:  You are afraid of falling at home.  You feel weak, drowsy, or dizzy at home.  You fall at home. Summary  There are many simple things that you can do to make your home safe and to help prevent falls.  Ways to make your home safe include removing tripping hazards and installing grab bars in the bathroom.  Ask for help when making these changes in your home. This information is not intended to replace advice given to you by your health care provider. Make sure you discuss any questions you have with your health care provider. Document Revised: 11/26/2017 Document Reviewed: 07/29/2017 Elsevier Patient Education  2020 [employer].    I discussed the assessment and treatment plan with the patient. The patient was provided an opportunity to  ask questions and all were answered. The patient agreed with the plan and demonstrated an understanding of the instructions.   The patient was advised to call back or seek an in-person evaluation if the symptoms worsen or if the condition fails to improve as anticipated.  I provided 35 minutes of non-face-to-face time during this encounter. 106mn call, 5 min chart review.   JWendie Agreste MD

## 2020-01-19 ENCOUNTER — Other Ambulatory Visit: Payer: Self-pay | Admitting: *Deleted

## 2020-01-19 NOTE — Patient Outreach (Signed)
[facility]) Care Management  [place]  01/19/2020   DUNCAN BLOYER Dec 28, 1928 [id number]  [place] telephone received call from  Patient caregiver [name].  [employer] compliance verified. Per [name] the patient is doing good. [name] caregiver ha a system for the patient theat is making it better for him to take his medications. Caregiver is concerned because the patient is not getting ourt as much and being there a lot by himself. Caregiver and Research scientist (medical) discussed senior day care services such as [facility]. Caregiver is going to talk with patient daughter and request the information from [place].Caregiver has agreed to follow up outreach calls for patient   Encounter Medications:  Outpatient Encounter Medications as of 01/19/2020  Medication Sig  . ammonium lactate (AMLACTIN) 12 % lotion Apply to both feet twice daily for dry skin  . aspirin EC 81 MG tablet Take 81 mg by mouth daily. Reported on 05/14/2016  . Calcium Carb-Cholecalciferol (CALCIUM 600 + D PO) Take 1 tablet by mouth daily.  Marland Kitchen donepezil (ARICEPT) 5 MG tablet Take 1 tablet (5 mg total) by mouth at bedtime.  . DULoxetine (CYMBALTA) 30 MG capsule Take 1 capsule (30 mg total) by mouth 2 (two) times daily.  . ERLEADA 60 MG tablet Take 120 mg by mouth daily.   Marland Kitchen gabapentin (NEURONTIN) 600 MG tablet TAKE 2 TABLET BY MOUTH IN MORNING AND 2 TABLET AT MIDDAY AND 1 TABLET IN EVENING  . hydrochlorothiazide (HYDRODIURIL) 12.5 MG tablet Take 1 tablet (12.5 mg total) by mouth daily.  Marland Kitchen Leuprolide Acetate (LUPRON IJ) Inject 1 application as directed every 6 (six) months.  . simvastatin (ZOCOR) 40 MG tablet TAKE 1 TABLET BY MOUTH ONCE DAILY  . tamsulosin (FLOMAX) 0.4 MG CAPS capsule Take 0.4 mg daily by mouth. Reported on 05/14/2016   No facility-administered encounter medications on file as of 01/19/2020.    Functional Status:  In your present state of health, do you have any difficulty performing the  following activities: 10/31/2019 08/08/2019  Hearing? Y N  Comment patient had hearing test and looking at getting hearing aid -  Vision? N N  Difficulty concentrating or making decisions? N N  Walking or climbing stairs? Y Y  Comment uses cane or walker back problems  Dressing or bathing? N N  Doing errands, shopping? Tempie Donning  Comment friends take patient to do shopping depends on daughter and friend  Conservation officer, nature and eating ? Y N  Comment Friends bring meals and meals on wheels -  Using the Toilet? N N  In the past six months, have you accidently leaked urine? Y N  Do you have problems with loss of bowel control? N N  Managing your Medications? N N  Managing your Finances? N N  Housekeeping or managing your Housekeeping? N N  Some recent data might be hidden    Fall/Depression Screening: Fall Risk  01/19/2020 12/06/2019 10/31/2019  Falls in the past year? 0 1 0  Comment - - -  Number falls in past yr: 1 0 0  Comment - - -  Injury with Fall? 0 0 0  Risk Factor Category  - - -  Risk for fall due to : - - -  Follow up Falls evaluation completed;Education provided Falls evaluation completed Falls evaluation completed;Education provided;Falls prevention discussed   PHQ 2/9 Scores 12/06/2019 10/31/2019 10/05/2019 08/31/2019 08/07/2019 07/17/2019 06/14/2019  PHQ - 2 Score 0 0 0 0 0 0 0  Assessment:  Caregiver has revised medication system Patient is taking medications as prescribed Caregiver is checking blood pressures Caregivers prepare patient foods Friends provide transportation to Dr visits Plan:  RN sent a 2021 Calendar book RN sent educational material on COVID Vaccine Patient and caregiver will monitor patient blood pressure and document RN discussed [facility] services Caregiver and daughter will contact [facility] for further information RN will follow up outreach within the month of April  [facility] [phone]

## 2020-01-22 ENCOUNTER — Other Ambulatory Visit: Payer: Self-pay

## 2020-01-22 NOTE — Patient Outreach (Signed)
[facility]) Care Management  01/22/2020  Casey Fisher Mar 05, 1928 [id number]   MD referral to Social work received on 01/19/20.  "eval for home assistance options.  HH PT ordered. Patient lives by self in home, but has friends that check on him. dtr requesting [name] aide." Daughter [name]: [phone]  [name]: [phone]  Successful outreach to patient and daughter today(separate calls).  Informed both that Medicare does not cover long-term, in-home aide services.  Informed them that temporary aide services can be ordered by MD and are only covered while in conjunction with skilled service.  Patient unable to afford private pay fee for long-term aide services.  Discussed Medicaid eligibility and application process.  Informed them that patient can be assessed for personal care services if approved for Medicaid.  Patient requested that application be mailed to him and daughter said that she can assist with completion/submission.   Encouraged daughter to reach out to me if patient is approved for Medicaid as I can assist with navigating process to request pcs.   Will follow up with patient within the next two weeks to ensure receipt of application.  [name], BSW Social Worker [phone]

## 2020-01-25 ENCOUNTER — Ambulatory Visit (INDEPENDENT_AMBULATORY_CARE_PROVIDER_SITE_OTHER): Payer: Medicare Other | Admitting: Family Medicine

## 2020-01-25 ENCOUNTER — Other Ambulatory Visit: Payer: Self-pay

## 2020-01-25 VITALS — BP 132/66 | HR 83 | Temp 98.5°F | Wt 174.6 lb

## 2020-01-25 DIAGNOSIS — G629 Polyneuropathy, unspecified: Secondary | ICD-10-CM

## 2020-01-25 DIAGNOSIS — R269 Unspecified abnormalities of gait and mobility: Secondary | ICD-10-CM | POA: Diagnosis not present

## 2020-01-25 DIAGNOSIS — S50811A Abrasion of right forearm, initial encounter: Secondary | ICD-10-CM | POA: Diagnosis not present

## 2020-01-25 DIAGNOSIS — R5381 Other malaise: Secondary | ICD-10-CM

## 2020-01-25 DIAGNOSIS — S50819A Abrasion of unspecified forearm, initial encounter: Secondary | ICD-10-CM

## 2020-01-25 DIAGNOSIS — Y92009 Unspecified place in unspecified non-institutional (private) residence as the place of occurrence of the external cause: Secondary | ICD-10-CM

## 2020-01-25 DIAGNOSIS — W19XXXD Unspecified fall, subsequent encounter: Secondary | ICD-10-CM

## 2020-01-25 NOTE — Progress Notes (Signed)
Subjective:  Patient ID: Casey Fisher, male    DOB: [birth year]  Age: 86 y.o. MRN: [id number]  CC:  Chief Complaint  Patient presents with  . Referral    for home health. pt hasn't heard from the rehab referal from last tele-health.  . Edema    of the R leg. skin apers dry and pt states is a little soure. no drainage .    HPI Casey Fisher presents for   Deconditioning, history of fall, peripheral neuropathy. See previous notes, telemedicine visit January 21. Moving more slowly past few months, dragging feet more, 1 fall earlier last week without reported injury.  Does have friends that check on him, but asking for home health aide assistance intermittently as well as restorative physical therapy.  Referral was placed to social work.  Also physical therapy, but an office evaluation needed.  He is using a walker at home, plan for evaluation by PT to discuss change in walker.  Additionally asked that neurology follow-up with him to decide if other testing needed with change in his gait disorder.   Repeat fall 4 days ago? Scraped R arm - he thinks he scraped forearm on button on comforter.  Casey Fisher had noticed some blood on the bed.  Difficult historian - initially denied fall, then did admit to fall at some time- reports fall out of chair that was discussed prior - maybe repeat fall out of chair.  Lives at home by self. Feels safe with others checking on him.  Scrape on R arm healing.  No other injuries. Denies head injury.   Peripheral edema: Chronic peripheral edema with some chronic appearing stasis changes seen previously.  Thought to have dependent PVD.  Recommended treatment with compression stockings, leg elevation, hydrating lotion to dry areas. Not using compression stockings - trouble getting them off.  Using lotion to dry skin few times per day.  Chronic peripheral neuropathy pain. No bone pain, no changes.   Home BP 108-154/55-80. HR 70's.  Not missing med  doses.       History Patient Active Problem List   Diagnosis Date Noted  . Low back pain 08/03/2019  . Acute pain of left shoulder 10/10/2018  . Chest pain 11/08/2017  . Memory difficulty 03/24/2016  . Hereditary and idiopathic peripheral neuropathy 03/24/2016  . Abnormality of gait 03/24/2016  . Peripheral neuropathy 04/18/2015  . Hyperlipidemia 04/18/2015  . Prostate cancer ([place]) 08/24/2014  . Right inguinal hernia 10/30/2013   Past Medical History:  Diagnosis Date  . Abnormality of gait 03/24/2016  . Anxiety   . Cancer ([place])   . Cataract   . Depression   . Heart murmur   . Hereditary and idiopathic peripheral neuropathy 03/24/2016  . Hypertension   . Memory difficulty 03/24/2016  . Neuromuscular disorder ([place])   . Neuropathy   . Prostate cancer ([place])    remission   Past Surgical History:  Procedure Laterality Date  . CATARACT EXTRACTION Bilateral   . EYE SURGERY    . HERNIA REPAIR  1985  . INGUINAL HERNIA REPAIR Right 12/06/2013   Procedure: HERNIA REPAIR INGUINAL ADULT;  Surgeon: Merrie Roof, MD;  Location: [place];  Service: General;  Laterality: Right;  . INSERTION OF MESH Right 12/06/2013   Procedure: INSERTION OF MESH;  Surgeon: Merrie Roof, MD;  Location: [place];  Service: General;  Laterality: Right;  . PROSTATE SURGERY     Allergies  Allergen Reactions  .  Lyrica [Pregabalin] Other (See Comments)    "constipation"   Prior to Admission medications   Medication Sig Start Date End Date Taking? Authorizing Provider  ammonium lactate (AMLACTIN) 12 % lotion Apply to both feet twice daily for dry skin 05/25/19  Yes [place], Stephani Police, DPM  aspirin EC 81 MG tablet Take 81 mg by mouth daily. Reported on 05/14/2016   Yes [provider]  Calcium Carb-Cholecalciferol (CALCIUM 600 + D PO) Take 1 tablet by mouth daily.   Yes [provider]  donepezil (ARICEPT) 5 MG tablet Take 1 tablet (5 mg total) by mouth at bedtime. 11/30/19  Yes Suzzanne Cloud, NP  DULoxetine (CYMBALTA) 30 MG capsule Take 1 capsule (30 mg total) by mouth 2 (two) times daily. 11/27/19  Yes Suzzanne Cloud, NP  ERLEADA 60 MG tablet Take 120 mg by mouth daily.  06/06/18  Yes [provider]  gabapentin (NEURONTIN) 600 MG tablet TAKE 2 TABLET BY MOUTH IN MORNING AND 2 TABLET AT MIDDAY AND 1 TABLET IN EVENING 12/06/19  Yes [name], MD  hydrochlorothiazide (HYDRODIURIL) 12.5 MG tablet Take 1 tablet (12.5 mg total) by mouth daily. 12/06/19  Yes [name], MD  Leuprolide Acetate (LUPRON IJ) Inject 1 application as directed every 6 (six) months.   Yes [provider]  simvastatin (ZOCOR) 40 MG tablet TAKE 1 TABLET BY MOUTH ONCE DAILY 08/17/19  Yes [name], MD  tamsulosin (FLOMAX) 0.4 MG CAPS capsule Take 0.4 mg daily by mouth. Reported on 05/14/2016 08/17/15  Yes [provider]   Social History   Socioeconomic History  . Marital status: Widowed    Spouse name: Not on file  . Number of children: Not on file  . Years of education: Not on file  . Highest education level: Not on file  Occupational History  . Not on file  Tobacco Use  . Smoking status: Former Research scientist (life sciences)  . Smokeless tobacco: Never Used  Substance and Sexual Activity  . Alcohol use: No  . Drug use: No  . Sexual activity: Not on file  Other Topics Concern  . Not on file  Social History Narrative   Lives home alone.  Retired.  Widowed.  % children.     Social Determinants of Health   Financial Resource Strain:   . Difficulty of Paying Living Expenses: Not on file  Food Insecurity: No Food Insecurity  . Worried About Charity fundraiser in the Last Year: Never true  . Ran Out of Food in the Last Year: Never true  Transportation Needs: No Transportation Needs  . Lack of Transportation (Medical): No  . Lack of Transportation (Non-Medical): No  Physical Activity:   . Days of Exercise per Week: Not on file  . Minutes of Exercise per Session: Not on  file  Stress:   . Feeling of Stress : Not on file  Social Connections: Slightly Isolated  . Frequency of Communication with Friends and Family: More than three times a week  . Frequency of Social Gatherings with Friends and Family: More than three times a week  . Attends Religious Services: More than 4 times per year  . Active Member of Clubs or Organizations: Yes  . Attends Archivist Meetings: More than 4 times per year  . Marital Status: Widowed  Intimate Partner Violence:   . Fear of Current or Ex-Partner: Not on file  . Emotionally Abused: Not on file  . Physically Abused: Not  on file  . Sexually Abused: Not on file    Review of Systems  Per HPI Objective:   Vitals:   01/25/20 1059  BP: 132/66  Pulse: 83  Temp: 98.5 F (36.9 C)  TempSrc: Temporal  SpO2: 98%  Weight: 174 lb 9.6 oz (79.2 kg)    Physical Exam Vitals reviewed.  Constitutional:      Appearance: He is well-developed.  HENT:     Head: Normocephalic and atraumatic.  Eyes:     Pupils: Pupils are equal, round, and reactive to light.  Neck:     Vascular: No carotid bruit or JVD.  Cardiovascular:     Rate and Rhythm: Normal rate and regular rhythm.     Heart sounds: Normal heart sounds. No murmur.  Pulmonary:     Effort: Pulmonary effort is normal.     Breath sounds: Normal breath sounds. No rales.  Skin:    General: Skin is warm and dry.     Comments: Dry in lower legs, min edema at ankle R >L, no wounds. Toes warm with cap refill 1-2 s, unable to palpate DP pulse.   Neurological:     Mental Status: He is alert.     Comments: Equal plantar, dorsi flexion, extension and flexion at knee with exam in wheelchair.  Requires assistance for elevation out of wheelchair but does still have approximately 5 to 10 seconds.  Some assistance was needed with ambulation narrow based shuffling gait.    Healing abrasion of the right forearm.    Assessment & Plan:  TOBENNA COLLE is a 86 y.o. male  . Fall at home, subsequent encounter Abnormality of gait Physical deconditioning Peripheral polyneuropathy Abrasion, forearm without infection  -Fall precautions were discussed.  Home health PT eval has been ordered, suspect component of deconditioning, peripheral neuropathy as well as chronic degenerative disc disease of lower back all contributing.  Neuro eval pending for further evaluation of change in gait although history of gait disorder.  ER precautions given.  -Abrasion of forearm is healing, no other apparent injuries.  -Continue hydrating lotion to dry skin on lower legs, suspect component of PVD.  No critical limb ischemia identified.  RTC precautions.  No orders of the defined types were placed in this encounter.  Patient Instructions    I do not see any wounds on your legs today, continue to use lotion at least 2-3 times per day.  Eucerin or Lubriderm can be more hydrating.  If any worse, rashes, drainage, be seen right away.  Abrasion on the arm appears to be healing well.  Continue to be careful with getting up and out of chairs, make sure you use your walker.  I will try to get physical therapy evaluation for this soon as possible.  Social work evaluation as well.  If you have not heard from neurology next week, let me know or call them directly.  Return to the clinic or go to the nearest emergency room if any of your symptoms worsen or new symptoms occur.    If you have lab work done today you will be contacted with your lab results within the next 2 weeks.  If you have not heard from [place] then please contact us. The fastest way to get your results is to register for My Chart.   IF you received an x-ray today, you will receive an invoice from [facility]. Please contact [facility] at [phone] with questions or concerns regarding your invoice.  IF you received labwork today, you will receive an invoice from [facility]. Please contact [facility] at  [phone] with questions or concerns regarding your invoice.   Our billing staff will not be able to assist you with questions regarding bills from these companies.  You will be contacted with the lab results as soon as they are available. The fastest way to get your results is to activate your My Chart account. Instructions are located on the last page of this paperwork. If you have not heard from [place] regarding the results in 2 weeks, please contact this office.         Signed, Merri Ray, MD [facility]

## 2020-01-25 NOTE — Patient Instructions (Addendum)
  I do not see any wounds on your legs today, continue to use lotion at least 2-3 times per day.  Eucerin or Lubriderm can be more hydrating.  If any worse, rashes, drainage, be seen right away.  Abrasion on the arm appears to be healing well.  Continue to be careful with getting up and out of chairs, make sure you use your walker.  I will try to get physical therapy evaluation for this soon as possible.  Social work evaluation as well.  If you have not heard from neurology next week, let me know or call them directly.  Return to the clinic or go to the nearest emergency room if any of your symptoms worsen or new symptoms occur.    If you have lab work done today you will be contacted with your lab results within the next 2 weeks.  If you have not heard from [place] then please contact us. The fastest way to get your results is to register for My Chart.   IF you received an x-ray today, you will receive an invoice from [facility]. Please contact [facility] at [phone] with questions or concerns regarding your invoice.   IF you received labwork today, you will receive an invoice from [place] [facility]. Please contact [facility] at [phone] with questions or concerns regarding your invoice.   Our billing staff will not be able to assist you with questions regarding bills from these companies.  You will be contacted with the lab results as soon as they are available. The fastest way to get your results is to activate your My Chart account. Instructions are located on the last page of this paperwork. If you have not heard from [place] regarding the results in 2 weeks, please contact this office.

## 2020-01-26 ENCOUNTER — Ambulatory Visit: Payer: Self-pay

## 2020-01-27 ENCOUNTER — Encounter: Payer: Self-pay | Admitting: Family Medicine

## 2020-02-01 ENCOUNTER — Ambulatory Visit: Payer: Medicare Other | Attending: Internal Medicine

## 2020-02-01 DIAGNOSIS — Z23 Encounter for immunization: Secondary | ICD-10-CM | POA: Insufficient documentation

## 2020-02-01 NOTE — Progress Notes (Signed)
   Covid-19 Vaccination Clinic  Name:  Casey Fisher    MRN: [id number] DOB: 08-11-1928  02/01/2020  [name] was observed post Covid-19 immunization for 15 minutes without incidence. He was provided with Vaccine Information Sheet and instruction to access the V-Safe system.   [name] was instructed to call 911 with any severe reactions post vaccine: Marland Kitchen Difficulty breathing  . Swelling of your face and throat  . A fast heartbeat  . A bad rash all over your body  . Dizziness and weakness    Immunizations Administered    Name Date Dose VIS Date Route   Pfizer COVID-19 Vaccine 02/01/2020  9:02 AM 0.3 mL 12/08/2019 Intramuscular   Manufacturer: [place]   Lot: [id number]   [place]: [id number]

## 2020-02-02 ENCOUNTER — Encounter: Payer: Self-pay | Admitting: Neurology

## 2020-02-02 ENCOUNTER — Ambulatory Visit (INDEPENDENT_AMBULATORY_CARE_PROVIDER_SITE_OTHER): Payer: Medicare Other | Admitting: Neurology

## 2020-02-02 ENCOUNTER — Other Ambulatory Visit: Payer: Self-pay

## 2020-02-02 ENCOUNTER — Telehealth: Payer: Self-pay | Admitting: Neurology

## 2020-02-02 VITALS — BP 145/73 | HR 56 | Temp 96.3°F

## 2020-02-02 DIAGNOSIS — E538 Deficiency of other specified B group vitamins: Secondary | ICD-10-CM

## 2020-02-02 DIAGNOSIS — G6289 Other specified polyneuropathies: Secondary | ICD-10-CM

## 2020-02-02 DIAGNOSIS — R413 Other amnesia: Secondary | ICD-10-CM | POA: Diagnosis not present

## 2020-02-02 DIAGNOSIS — R269 Unspecified abnormalities of gait and mobility: Secondary | ICD-10-CM

## 2020-02-02 DIAGNOSIS — G3281 Cerebellar ataxia in diseases classified elsewhere: Secondary | ICD-10-CM

## 2020-02-02 MED ORDER — CARBIDOPA-LEVODOPA 25-100 MG PO TABS: ORAL_TABLET | ORAL | 3 refills | Status: DC

## 2020-02-02 NOTE — Progress Notes (Signed)
Reason for visit: Gait disorder, peripheral neuropathy, memory disorder  Referring physician: Dr. Lonna Fisher is a 86 y.o. male  History of present illness:  Casey Fisher is an 86 year old right-handed black male who has been followed through this office for a number of years.  In 2008, he was diagnosed with a peripheral neuropathy, he has had a chronic gait disorder over many years.  More recently, he has had a significant decline in his ability to ambulate, he is shuffling his feet, he is walking with his knees flexed, he now has tendency to lean to the left.  The patient fell yesterday, but he fell asleep while sitting in a chair and fell over.  He uses a cane or a walker in the home environment.  He underwent physical therapy about 4 months ago according to the family members, he seemed to improve with this but then worsened as soon as the physical therapy stopped.  The patient does not drool, he has not had problems with swallowing.  He denies neck pain or low back pain or pain down the arms or legs.  He has some numbness of the right hand and numbness in the feet.  He denies issues controlling the bowels or the bladder.  He is sleeping well at night.  He is sent back to this office for further evaluation of the walking problem.  He has been followed through this office with a progressive memory disturbance.  Past Medical History:  Diagnosis Date  . Abnormality of gait 03/24/2016  . Anxiety   . Cancer ([place])   . Cataract   . Depression   . Heart murmur   . Hereditary and idiopathic peripheral neuropathy 03/24/2016  . Hypertension   . Memory difficulty 03/24/2016  . Neuromuscular disorder ([place])   . Neuropathy   . Prostate cancer ([place])    remission    Past Surgical History:  Procedure Laterality Date  . CATARACT EXTRACTION Bilateral   . EYE SURGERY    . HERNIA REPAIR  1985  . INGUINAL HERNIA REPAIR Right 12/06/2013   Procedure: HERNIA REPAIR INGUINAL ADULT;   Surgeon: Casey Roof, Fisher;  Location: [facility];  Service: General;  Laterality: Right;  . INSERTION OF MESH Right 12/06/2013   Procedure: INSERTION OF MESH;  Surgeon: Casey Roof, Fisher;  Location: [facility];  Service: General;  Laterality: Right;  . PROSTATE SURGERY      Family History  Problem Relation Age of Onset  . Brain cancer Daughter   . Heart disease Mother   . Stroke Mother   . Heart disease Father     Social history:  reports that he has quit smoking. He has never used smokeless tobacco. He reports that he does not drink alcohol or use drugs.  Medications:  Prior to Admission medications   Medication Sig Start Date End Date Taking? Authorizing Provider  ammonium lactate (AMLACTIN) 12 % lotion Apply to both feet twice daily for dry skin 05/25/19  Yes Casey Fisher  aspirin EC 81 MG tablet Take 81 mg by mouth daily. Reported on 05/14/2016   Yes Provider, Historical, Fisher  Calcium Carb-Cholecalciferol (CALCIUM 600 + D PO) Take 1 tablet by mouth daily.   Yes Provider, Historical, Fisher  donepezil (ARICEPT) 5 MG tablet Take 1 tablet (5 mg total) by mouth at bedtime. 11/30/19  Yes Casey Fisher  DULoxetine (CYMBALTA) 30 MG capsule Take 1 capsule (30 mg total)  by mouth 2 (two) times daily. 11/27/19  Yes Casey Fisher  ERLEADA 60 MG tablet Take 120 mg by mouth daily.  06/06/18  Yes Provider, Historical, Fisher  gabapentin (NEURONTIN) 600 MG tablet TAKE 2 TABLET BY MOUTH IN MORNING AND 2 TABLET AT MIDDAY AND 1 TABLET IN EVENING 12/06/19  Yes Casey Fisher  hydrochlorothiazide (HYDRODIURIL) 12.5 MG tablet Take 1 tablet (12.5 mg total) by mouth daily. 12/06/19  Yes Casey Fisher  Leuprolide Acetate (LUPRON IJ) Inject 1 application as directed every 6 (six) months.   Yes Provider, Historical, Fisher  simvastatin (ZOCOR) 40 MG tablet TAKE 1 TABLET BY MOUTH ONCE DAILY 08/17/19  Yes Casey Fisher  tamsulosin (FLOMAX) 0.4 MG CAPS capsule Take 0.4 mg daily by mouth.  Reported on 05/14/2016 08/17/15  Yes Provider, Historical, Fisher      Allergies  Allergen Reactions  . Lyrica [Pregabalin] Other (See Comments)    "constipation"    ROS:  Out of a complete 14 system review of symptoms, the patient complains only of the following symptoms, and all other reviewed systems are negative.  Walking difficulty Memory problems Numbness  Blood pressure (!) 145/73, pulse (!) 56, temperature (!) 96.3 F (35.7 C).  Physical Exam  General: The patient is alert and cooperative at the time of the examination.  Eyes: Pupils are equal, round, and reactive to light. Discs are flat bilaterally.  Neck: The neck is supple, no carotid bruits are noted.  Respiratory: The respiratory examination is clear.  Cardiovascular: The cardiovascular examination reveals a regular rate and rhythm, no obvious murmurs or rubs are noted.  Skin: Extremities are with 1-2+ edema below the knees early.  Neurologic Exam  Mental status: The patient is alert and oriented x 3 at the time of the examination.  Cranial nerves: Facial symmetry is present. There is good sensation of the face to pinprick and soft touch bilaterally. The strength of the facial muscles and the muscles to head turning and shoulder shrug are normal bilaterally. Speech is well enunciated, no aphasia or dysarthria is noted. Extraocular movements are full, with exception of some restriction of superior gaze. Visual fields are full. The tongue is midline, and the patient has symmetric elevation of the soft palate. No obvious hearing deficits are noted.  Mild masking of the face is seen.  Motor: The motor testing reveals 5 over 5 strength of all 4 extremities, with exception of some weakness of intrinsic muscles of the hands, right greater than left. Good symmetric motor tone is noted throughout.  Sensory: Sensory testing is intact to pinprick, soft touch, vibration sensation, and position sense on the upper extremities.   With the lower extremities, there is no clear stocking pattern pinprick sensory deficit, but the patient has significant impairment of vibration and position sense in both feet. No evidence of extinction is noted.  Coordination: Cerebellar testing reveals good finger-nose-finger and heel-to-shin bilaterally.  Gait and station: Gait is somewhat wide-based, the patient walks with his knees flexed, he at times has a tendency to lean backwards.  He is leaning slightly to the left.  He requires some assistance with walking.  He takes short shuffling steps.  Tandem gait was not attempted.  Reflexes: Deep tendon reflexes are symmetric, but are depressed bilaterally. Toes are downgoing bilaterally.   Assessment/Plan:  1.  Peripheral neuropathy  2.  Chronic gait disorder  3.  Memory disorder  The patient does have a significant peripheral neuropathy that  is impairing balance.  He however has developed some other features with impaired superior gaze, leaning to the left, walking with the knees flexed, slowness of movement.  It is possible he may be developing a parkinsonian syndrome.  The patient will be started on Sinemet with the 25/100 mg tablets, starting at 1/2 tablet 3 times daily and working up to 1 full tablet 3 times daily.  The family will watch out for increased confusion on this medication.  The patient will be sent back for home health physical therapy.  He will undergo a CT scan of the brain to exclude normal pressure hydrocephalus.  Blood work will be done today.  We will follow his walking issues over time.  A prescription for a rolling walker was given.  Jill Alexanders Fisher 02/02/2020 8:31 AM  [facility] [address] [place],  [address]  Phone [phone] Fax [phone]

## 2020-02-02 NOTE — Telephone Encounter (Signed)
Medicare/United supp order sent to GI. They will reach out to the patient to schedule.

## 2020-02-02 NOTE — Patient Instructions (Signed)
We will start Sinemet 25/100 mg tablets for the walking problem.  Sinemet (carbidopa) may result in confusion or hallucinations, drowsiness, nausea, or dizziness. If any significant side effects are noted, please contact our office. Sinemet may not be well absorbed when taken with high protein meals, if tolerated it is best to take 30-45 minutes before you eat.

## 2020-02-03 LAB — VITAMIN B12: Vitamin B-12: 247 pg/mL (ref 232–1245)

## 2020-02-03 LAB — RPR: RPR Ser Ql: NONREACTIVE

## 2020-02-05 ENCOUNTER — Other Ambulatory Visit: Payer: Self-pay

## 2020-02-05 ENCOUNTER — Telehealth: Payer: Self-pay

## 2020-02-05 NOTE — Patient Outreach (Signed)
[facility]) Care Management  02/05/2020  DELTON WINDLEY 11/22/1928 [id number]   Attempted to reach patient to ensure receipt of Medicaid application, however, had to leave message.  Was able to get in touch with his daughter and she stated she would check with patient and have him call me back.  Will attempt to reach patient again within four business days if no return call before then.  [name], BSW Social Worker [phone]

## 2020-02-05 NOTE — Telephone Encounter (Signed)
Left vm for patient to call back about lab work results. ------ 

## 2020-02-05 NOTE — Telephone Encounter (Signed)
-----   Message from Kathrynn Ducking, MD sent at 02/05/2020  4:33 PM EST -----  The blood work results are unremarkable. Please call the patient. ----- Message ----- From: Lavone Neri Lab Results In Sent: 02/03/2020   7:37 AM EST To: Kathrynn Ducking, MD

## 2020-02-06 NOTE — Telephone Encounter (Signed)
-----   Message from Kathrynn Ducking, MD sent at 02/05/2020  4:33 PM EST -----  The blood work results are unremarkable. Please call the patient. ----- Message ----- From: Lavone Neri Lab Results In Sent: 02/03/2020   7:37 AM EST To: Kathrynn Ducking, MD

## 2020-02-06 NOTE — Telephone Encounter (Signed)
called pt that labs were unremarkable. Pt verbalized understanding.

## 2020-02-07 ENCOUNTER — Ambulatory Visit: Payer: Self-pay

## 2020-02-07 ENCOUNTER — Other Ambulatory Visit: Payer: Self-pay

## 2020-02-07 NOTE — Patient Outreach (Addendum)
[facility]) Care Management  02/07/2020  BILLE PALLONE 1928-03-12 [id number]   Second attempt to reach patient to ensure receipt of Medicaid application, however, had to leave message.  Received voicemail message from caregiver of patient, Thomasene Lot, inquiring about voicemail messages left for patient this week.  Called Mr. [name] back.  Left voicemail message and informed him patient cannot be discussed without consent from him.   Will attempt to reach patient again within four business days if no return call before then.      Addendum: Received return call from patient.  Consent given for me to communicate with caregiver, [name].   Contacted Mr. [name].  Informed him of reason for original referral; in-home assistance options.  Informed him that Medicare does not cover long-term, in-home services.  Talked with him about Medicaid eligibility and personal care services.  He confirmed that patient received Medicaid application that was mailed and that he can assist with completion/submission.   Social Work case is being closed at this time but did encourage Mr. [name] or patient to call back if additional assistance is needed.     [name], BSW Social Worker [phone]

## 2020-02-08 ENCOUNTER — Ambulatory Visit: Payer: Self-pay

## 2020-02-13 ENCOUNTER — Other Ambulatory Visit: Payer: Self-pay

## 2020-02-13 ENCOUNTER — Ambulatory Visit
Admission: RE | Admit: 2020-02-13 | Discharge: 2020-02-13 | Disposition: A | Discharge status: Home / Self Care | Payer: Medicare Other | Source: Ambulatory Visit | Attending: Neurology | Admitting: Neurology

## 2020-02-13 DIAGNOSIS — G6289 Other specified polyneuropathies: Secondary | ICD-10-CM

## 2020-02-13 DIAGNOSIS — G3281 Cerebellar ataxia in diseases classified elsewhere: Secondary | ICD-10-CM | POA: Diagnosis not present

## 2020-02-13 DIAGNOSIS — R413 Other amnesia: Secondary | ICD-10-CM

## 2020-02-13 DIAGNOSIS — R269 Unspecified abnormalities of gait and mobility: Secondary | ICD-10-CM | POA: Diagnosis not present

## 2020-02-15 ENCOUNTER — Telehealth: Payer: Self-pay | Admitting: Neurology

## 2020-02-15 NOTE — Telephone Encounter (Signed)
I called concerned the CT report.  The patient has at least a moderate level of small vessel ischemic changes, this could be an additive factor with his balance.  The patient is being treated for possible Parkinson's disease, he does have some features of parkinsonism.  If they have any concerns about the use of this medication they are to contact our office.   CT head 02/15/20:  IMPRESSION:   CT head (without) demonstrating: - Moderate-severe periventricular and subcortical chronic small vessel ischemic disease.  - Mild atrophy. - No acute findings.

## 2020-02-16 ENCOUNTER — Ambulatory Visit: Payer: Medicare Other | Admitting: Pharmacist

## 2020-02-16 NOTE — Telephone Encounter (Signed)
I called the patient.  I talk with the daughter.  I discussed the results of the CT.  The small vessel disease and peripheral neuropathy are likely a contributing factor to the walking.  There is a question of parkinsonism, he has been placed on Sinemet and if this offers good benefit the diagnosis of Parkinson disease could be confirmed.  The patient will be undergoing physical therapy in the near future, we will follow-up in early June 2021.

## 2020-02-16 NOTE — Telephone Encounter (Signed)
Casey Fisher,Casey Fisher(daughter on DPR) has called asking that she gets a call with the results to the CT scan, pt did not fully understand all that Dr [name] relayed.  Please call

## 2020-02-21 DIAGNOSIS — G709 Myoneural disorder, unspecified: Secondary | ICD-10-CM | POA: Diagnosis not present

## 2020-02-21 DIAGNOSIS — Z9181 History of falling: Secondary | ICD-10-CM | POA: Diagnosis not present

## 2020-02-21 DIAGNOSIS — H269 Unspecified cataract: Secondary | ICD-10-CM | POA: Diagnosis not present

## 2020-02-21 DIAGNOSIS — Z87891 Personal history of nicotine dependence: Secondary | ICD-10-CM | POA: Diagnosis not present

## 2020-02-21 DIAGNOSIS — C61 Malignant neoplasm of prostate: Secondary | ICD-10-CM | POA: Diagnosis not present

## 2020-02-21 DIAGNOSIS — F419 Anxiety disorder, unspecified: Secondary | ICD-10-CM | POA: Diagnosis not present

## 2020-02-21 DIAGNOSIS — G3281 Cerebellar ataxia in diseases classified elsewhere: Secondary | ICD-10-CM | POA: Diagnosis not present

## 2020-02-21 DIAGNOSIS — F329 Major depressive disorder, single episode, unspecified: Secondary | ICD-10-CM | POA: Diagnosis not present

## 2020-02-21 DIAGNOSIS — E785 Hyperlipidemia, unspecified: Secondary | ICD-10-CM | POA: Diagnosis not present

## 2020-02-21 DIAGNOSIS — Z7982 Long term (current) use of aspirin: Secondary | ICD-10-CM | POA: Diagnosis not present

## 2020-02-21 DIAGNOSIS — F0390 Unspecified dementia without behavioral disturbance: Secondary | ICD-10-CM | POA: Diagnosis not present

## 2020-02-21 DIAGNOSIS — G609 Hereditary and idiopathic neuropathy, unspecified: Secondary | ICD-10-CM | POA: Diagnosis not present

## 2020-02-21 DIAGNOSIS — I10 Essential (primary) hypertension: Secondary | ICD-10-CM | POA: Diagnosis not present

## 2020-02-26 ENCOUNTER — Ambulatory Visit: Payer: Self-pay

## 2020-02-26 ENCOUNTER — Ambulatory Visit: Payer: Medicare Other | Attending: Internal Medicine

## 2020-02-26 DIAGNOSIS — Z23 Encounter for immunization: Secondary | ICD-10-CM

## 2020-02-26 DIAGNOSIS — I10 Essential (primary) hypertension: Secondary | ICD-10-CM | POA: Diagnosis not present

## 2020-02-26 DIAGNOSIS — F0390 Unspecified dementia without behavioral disturbance: Secondary | ICD-10-CM | POA: Diagnosis not present

## 2020-02-26 DIAGNOSIS — G709 Myoneural disorder, unspecified: Secondary | ICD-10-CM | POA: Diagnosis not present

## 2020-02-26 DIAGNOSIS — C61 Malignant neoplasm of prostate: Secondary | ICD-10-CM | POA: Diagnosis not present

## 2020-02-26 DIAGNOSIS — G609 Hereditary and idiopathic neuropathy, unspecified: Secondary | ICD-10-CM | POA: Diagnosis not present

## 2020-02-26 DIAGNOSIS — G3281 Cerebellar ataxia in diseases classified elsewhere: Secondary | ICD-10-CM | POA: Diagnosis not present

## 2020-02-26 NOTE — Progress Notes (Signed)
   Covid-19 Vaccination Clinic  Name:  Casey Fisher    MRN: [id number] DOB: 10-02-1928  02/26/2020  [name] was observed post Covid-19 immunization for 15 minutes without incidence. He was provided with Vaccine Information Sheet and instruction to access the V-Safe system.   [name] was instructed to call 911 with any severe reactions post vaccine: Marland Kitchen Difficulty breathing  . Swelling of your face and throat  . A fast heartbeat  . A bad rash all over your body  . Dizziness and weakness    Immunizations Administered    Name Date Dose VIS Date Route   Pfizer COVID-19 Vaccine 02/26/2020  1:50 PM 0.3 mL 12/08/2019 Intramuscular   Manufacturer: [place]   Lot: [id number]   [place]: [id number]

## 2020-02-27 DIAGNOSIS — I10 Essential (primary) hypertension: Secondary | ICD-10-CM | POA: Diagnosis not present

## 2020-02-27 DIAGNOSIS — G609 Hereditary and idiopathic neuropathy, unspecified: Secondary | ICD-10-CM | POA: Diagnosis not present

## 2020-02-27 DIAGNOSIS — C61 Malignant neoplasm of prostate: Secondary | ICD-10-CM | POA: Diagnosis not present

## 2020-02-27 DIAGNOSIS — G709 Myoneural disorder, unspecified: Secondary | ICD-10-CM | POA: Diagnosis not present

## 2020-02-27 DIAGNOSIS — F0390 Unspecified dementia without behavioral disturbance: Secondary | ICD-10-CM | POA: Diagnosis not present

## 2020-02-27 DIAGNOSIS — G3281 Cerebellar ataxia in diseases classified elsewhere: Secondary | ICD-10-CM | POA: Diagnosis not present

## 2020-03-02 DIAGNOSIS — F0390 Unspecified dementia without behavioral disturbance: Secondary | ICD-10-CM | POA: Diagnosis not present

## 2020-03-02 DIAGNOSIS — I10 Essential (primary) hypertension: Secondary | ICD-10-CM | POA: Diagnosis not present

## 2020-03-02 DIAGNOSIS — G609 Hereditary and idiopathic neuropathy, unspecified: Secondary | ICD-10-CM | POA: Diagnosis not present

## 2020-03-02 DIAGNOSIS — C61 Malignant neoplasm of prostate: Secondary | ICD-10-CM | POA: Diagnosis not present

## 2020-03-02 DIAGNOSIS — G3281 Cerebellar ataxia in diseases classified elsewhere: Secondary | ICD-10-CM | POA: Diagnosis not present

## 2020-03-02 DIAGNOSIS — G709 Myoneural disorder, unspecified: Secondary | ICD-10-CM | POA: Diagnosis not present

## 2020-03-04 DIAGNOSIS — C61 Malignant neoplasm of prostate: Secondary | ICD-10-CM | POA: Diagnosis not present

## 2020-03-05 DIAGNOSIS — I10 Essential (primary) hypertension: Secondary | ICD-10-CM | POA: Diagnosis not present

## 2020-03-05 DIAGNOSIS — G609 Hereditary and idiopathic neuropathy, unspecified: Secondary | ICD-10-CM | POA: Diagnosis not present

## 2020-03-05 DIAGNOSIS — C61 Malignant neoplasm of prostate: Secondary | ICD-10-CM | POA: Diagnosis not present

## 2020-03-05 DIAGNOSIS — G709 Myoneural disorder, unspecified: Secondary | ICD-10-CM | POA: Diagnosis not present

## 2020-03-05 DIAGNOSIS — G3281 Cerebellar ataxia in diseases classified elsewhere: Secondary | ICD-10-CM | POA: Diagnosis not present

## 2020-03-05 DIAGNOSIS — F0390 Unspecified dementia without behavioral disturbance: Secondary | ICD-10-CM | POA: Diagnosis not present

## 2020-03-07 ENCOUNTER — Telehealth: Payer: Self-pay | Admitting: Neurology

## 2020-03-07 DIAGNOSIS — G609 Hereditary and idiopathic neuropathy, unspecified: Secondary | ICD-10-CM | POA: Diagnosis not present

## 2020-03-07 DIAGNOSIS — G3281 Cerebellar ataxia in diseases classified elsewhere: Secondary | ICD-10-CM | POA: Diagnosis not present

## 2020-03-07 DIAGNOSIS — G709 Myoneural disorder, unspecified: Secondary | ICD-10-CM | POA: Diagnosis not present

## 2020-03-07 DIAGNOSIS — C61 Malignant neoplasm of prostate: Secondary | ICD-10-CM | POA: Diagnosis not present

## 2020-03-07 DIAGNOSIS — F0390 Unspecified dementia without behavioral disturbance: Secondary | ICD-10-CM | POA: Diagnosis not present

## 2020-03-07 DIAGNOSIS — I10 Essential (primary) hypertension: Secondary | ICD-10-CM | POA: Diagnosis not present

## 2020-03-07 NOTE — Telephone Encounter (Signed)
Okay to give VO for ST.

## 2020-03-07 NOTE — Telephone Encounter (Signed)
Casey Fisher called to request verbal orders for speech therapy

## 2020-03-07 NOTE — Telephone Encounter (Signed)
Left vm for MInda that per Dr. Rexene Alberts for Dr. [name]. VM left with verbal for speech therapy.Left vm if she had any questions.

## 2020-03-08 ENCOUNTER — Ambulatory Visit (INDEPENDENT_AMBULATORY_CARE_PROVIDER_SITE_OTHER): Payer: Medicare Other | Admitting: Podiatry

## 2020-03-08 ENCOUNTER — Other Ambulatory Visit: Payer: Self-pay

## 2020-03-08 ENCOUNTER — Encounter: Payer: Self-pay | Admitting: Podiatry

## 2020-03-08 VITALS — Temp 97.3°F

## 2020-03-08 DIAGNOSIS — M79675 Pain in left toe(s): Secondary | ICD-10-CM | POA: Diagnosis not present

## 2020-03-08 DIAGNOSIS — M79674 Pain in right toe(s): Secondary | ICD-10-CM | POA: Diagnosis not present

## 2020-03-08 DIAGNOSIS — L84 Corns and callosities: Secondary | ICD-10-CM

## 2020-03-08 DIAGNOSIS — B351 Tinea unguium: Secondary | ICD-10-CM | POA: Diagnosis not present

## 2020-03-08 DIAGNOSIS — G629 Polyneuropathy, unspecified: Secondary | ICD-10-CM | POA: Diagnosis not present

## 2020-03-08 NOTE — Patient Instructions (Signed)

## 2020-03-11 DIAGNOSIS — C61 Malignant neoplasm of prostate: Secondary | ICD-10-CM | POA: Diagnosis not present

## 2020-03-11 DIAGNOSIS — R3914 Feeling of incomplete bladder emptying: Secondary | ICD-10-CM | POA: Diagnosis not present

## 2020-03-13 DIAGNOSIS — G709 Myoneural disorder, unspecified: Secondary | ICD-10-CM | POA: Diagnosis not present

## 2020-03-13 DIAGNOSIS — G3281 Cerebellar ataxia in diseases classified elsewhere: Secondary | ICD-10-CM | POA: Diagnosis not present

## 2020-03-13 DIAGNOSIS — C61 Malignant neoplasm of prostate: Secondary | ICD-10-CM | POA: Diagnosis not present

## 2020-03-13 DIAGNOSIS — F0390 Unspecified dementia without behavioral disturbance: Secondary | ICD-10-CM | POA: Diagnosis not present

## 2020-03-13 DIAGNOSIS — G609 Hereditary and idiopathic neuropathy, unspecified: Secondary | ICD-10-CM | POA: Diagnosis not present

## 2020-03-13 DIAGNOSIS — I10 Essential (primary) hypertension: Secondary | ICD-10-CM | POA: Diagnosis not present

## 2020-03-14 NOTE — Progress Notes (Signed)
Subjective: Casey Fisher presents today for follow up of at risk foot care. Patient has history of peripheral neuropathy and callus(es) left foot and painful mycotic toenails b/l that are difficult to trim. Pain interferes with ambulation. Aggravating factors include wearing enclosed shoe gear. Pain is relieved with periodic professional debridement..   Allergies  Allergen Reactions  . Lyrica [Pregabalin] Other (See Comments)    "constipation"     Objective: Vitals:   03/08/20 1333  Temp: (!) 97.3 F (36.3 C)   Pt 86 y.o. year old AA  male in NAD. AAO x 3.   Vascular Examination:  Capillary refill time to digits immediate b/l. Palpable DP pulses b/l. Nonpalpable PT pulses b/l. Pedal hair absent b/l Skin temperature gradient within normal limits b/l.  Dermatological Examination: Pedal skin with normal turgor, texture and tone bilaterally. No open wounds bilaterally. No interdigital macerations bilaterally. Toenails 1-5 b/l elongated, dystrophic, thickened, crumbly with subungual debris and tenderness to dorsal palpation. Hyperkeratotic lesion(s) submet head 1 left foot.  No erythema, no edema, no drainage, no flocculence.  Musculoskeletal: Normal muscle strength 5/5 to all lower extremity muscle groups bilaterally, no pain crepitus or joint limitation noted with ROM b/l, bunion deformity noted b/l and hammertoes noted to the  2-5 bilaterally.  Neurological: Protective sensation diminished with 10g monofilament b/l.   Assessment: 1. Pain due to onychomycosis of toenails of both feet   2. Callus   3. Neuropathy    Plan: -Toenails 1-5 b/l were debrided in length and girth with sterile nail nippers and dremel without iatrogenic bleeding.  -Calluses submet head 1 left foot were debrided without complication or incident. Total number debrided =1. -Patient to continue soft, supportive shoe gear daily. -Patient to report any pedal injuries to medical professional  immediately. -Patient/POA to call should there be question/concern in the interim.  Return in about 3 months (around 06/08/2020) for nail and callus trim.

## 2020-03-15 ENCOUNTER — Other Ambulatory Visit: Payer: Self-pay | Admitting: Pharmacist

## 2020-03-15 DIAGNOSIS — C61 Malignant neoplasm of prostate: Secondary | ICD-10-CM | POA: Diagnosis not present

## 2020-03-15 DIAGNOSIS — I10 Essential (primary) hypertension: Secondary | ICD-10-CM | POA: Diagnosis not present

## 2020-03-15 DIAGNOSIS — F0390 Unspecified dementia without behavioral disturbance: Secondary | ICD-10-CM | POA: Diagnosis not present

## 2020-03-15 DIAGNOSIS — G609 Hereditary and idiopathic neuropathy, unspecified: Secondary | ICD-10-CM | POA: Diagnosis not present

## 2020-03-15 DIAGNOSIS — G709 Myoneural disorder, unspecified: Secondary | ICD-10-CM | POA: Diagnosis not present

## 2020-03-15 DIAGNOSIS — G3281 Cerebellar ataxia in diseases classified elsewhere: Secondary | ICD-10-CM | POA: Diagnosis not present

## 2020-03-15 NOTE — Patient Outreach (Addendum)
[facility]) Care Management  03/15/2020  Casey Fisher 08/29/1928 [id number]  Patient's case is being closed as the [place] Team has transitioned to the Quality Department and will no longer document in [facility].  If patient requires further follow up, a referral will need to be placed through [employer].  Elayne Guerin, PharmD, [place] Clinical Pharmacist [phone]

## 2020-03-19 DIAGNOSIS — F0390 Unspecified dementia without behavioral disturbance: Secondary | ICD-10-CM | POA: Diagnosis not present

## 2020-03-19 DIAGNOSIS — G709 Myoneural disorder, unspecified: Secondary | ICD-10-CM | POA: Diagnosis not present

## 2020-03-19 DIAGNOSIS — C61 Malignant neoplasm of prostate: Secondary | ICD-10-CM | POA: Diagnosis not present

## 2020-03-19 DIAGNOSIS — G609 Hereditary and idiopathic neuropathy, unspecified: Secondary | ICD-10-CM | POA: Diagnosis not present

## 2020-03-19 DIAGNOSIS — I10 Essential (primary) hypertension: Secondary | ICD-10-CM | POA: Diagnosis not present

## 2020-03-19 DIAGNOSIS — G3281 Cerebellar ataxia in diseases classified elsewhere: Secondary | ICD-10-CM | POA: Diagnosis not present

## 2020-03-20 DIAGNOSIS — F0390 Unspecified dementia without behavioral disturbance: Secondary | ICD-10-CM | POA: Diagnosis not present

## 2020-03-20 DIAGNOSIS — G709 Myoneural disorder, unspecified: Secondary | ICD-10-CM | POA: Diagnosis not present

## 2020-03-20 DIAGNOSIS — I10 Essential (primary) hypertension: Secondary | ICD-10-CM | POA: Diagnosis not present

## 2020-03-20 DIAGNOSIS — G3281 Cerebellar ataxia in diseases classified elsewhere: Secondary | ICD-10-CM | POA: Diagnosis not present

## 2020-03-20 DIAGNOSIS — C61 Malignant neoplasm of prostate: Secondary | ICD-10-CM | POA: Diagnosis not present

## 2020-03-20 DIAGNOSIS — G609 Hereditary and idiopathic neuropathy, unspecified: Secondary | ICD-10-CM | POA: Diagnosis not present

## 2020-03-21 ENCOUNTER — Ambulatory Visit: Payer: Medicare Other | Admitting: Pharmacist

## 2020-03-22 DIAGNOSIS — F419 Anxiety disorder, unspecified: Secondary | ICD-10-CM | POA: Diagnosis not present

## 2020-03-22 DIAGNOSIS — I10 Essential (primary) hypertension: Secondary | ICD-10-CM | POA: Diagnosis not present

## 2020-03-22 DIAGNOSIS — H269 Unspecified cataract: Secondary | ICD-10-CM | POA: Diagnosis not present

## 2020-03-22 DIAGNOSIS — G3281 Cerebellar ataxia in diseases classified elsewhere: Secondary | ICD-10-CM | POA: Diagnosis not present

## 2020-03-22 DIAGNOSIS — G609 Hereditary and idiopathic neuropathy, unspecified: Secondary | ICD-10-CM | POA: Diagnosis not present

## 2020-03-22 DIAGNOSIS — Z7982 Long term (current) use of aspirin: Secondary | ICD-10-CM | POA: Diagnosis not present

## 2020-03-22 DIAGNOSIS — Z9181 History of falling: Secondary | ICD-10-CM | POA: Diagnosis not present

## 2020-03-22 DIAGNOSIS — G709 Myoneural disorder, unspecified: Secondary | ICD-10-CM | POA: Diagnosis not present

## 2020-03-22 DIAGNOSIS — E785 Hyperlipidemia, unspecified: Secondary | ICD-10-CM | POA: Diagnosis not present

## 2020-03-22 DIAGNOSIS — F0390 Unspecified dementia without behavioral disturbance: Secondary | ICD-10-CM | POA: Diagnosis not present

## 2020-03-22 DIAGNOSIS — Z87891 Personal history of nicotine dependence: Secondary | ICD-10-CM | POA: Diagnosis not present

## 2020-03-22 DIAGNOSIS — C61 Malignant neoplasm of prostate: Secondary | ICD-10-CM | POA: Diagnosis not present

## 2020-03-22 DIAGNOSIS — F329 Major depressive disorder, single episode, unspecified: Secondary | ICD-10-CM | POA: Diagnosis not present

## 2020-03-25 DIAGNOSIS — G609 Hereditary and idiopathic neuropathy, unspecified: Secondary | ICD-10-CM | POA: Diagnosis not present

## 2020-03-25 DIAGNOSIS — F0390 Unspecified dementia without behavioral disturbance: Secondary | ICD-10-CM | POA: Diagnosis not present

## 2020-03-25 DIAGNOSIS — I10 Essential (primary) hypertension: Secondary | ICD-10-CM | POA: Diagnosis not present

## 2020-03-25 DIAGNOSIS — C61 Malignant neoplasm of prostate: Secondary | ICD-10-CM | POA: Diagnosis not present

## 2020-03-25 DIAGNOSIS — G709 Myoneural disorder, unspecified: Secondary | ICD-10-CM | POA: Diagnosis not present

## 2020-03-25 DIAGNOSIS — G3281 Cerebellar ataxia in diseases classified elsewhere: Secondary | ICD-10-CM | POA: Diagnosis not present

## 2020-03-27 DIAGNOSIS — G709 Myoneural disorder, unspecified: Secondary | ICD-10-CM | POA: Diagnosis not present

## 2020-03-27 DIAGNOSIS — G609 Hereditary and idiopathic neuropathy, unspecified: Secondary | ICD-10-CM | POA: Diagnosis not present

## 2020-03-27 DIAGNOSIS — I10 Essential (primary) hypertension: Secondary | ICD-10-CM | POA: Diagnosis not present

## 2020-03-27 DIAGNOSIS — C61 Malignant neoplasm of prostate: Secondary | ICD-10-CM | POA: Diagnosis not present

## 2020-03-27 DIAGNOSIS — G3281 Cerebellar ataxia in diseases classified elsewhere: Secondary | ICD-10-CM | POA: Diagnosis not present

## 2020-03-27 DIAGNOSIS — F0390 Unspecified dementia without behavioral disturbance: Secondary | ICD-10-CM | POA: Diagnosis not present

## 2020-04-01 DIAGNOSIS — G3281 Cerebellar ataxia in diseases classified elsewhere: Secondary | ICD-10-CM | POA: Diagnosis not present

## 2020-04-01 DIAGNOSIS — F0390 Unspecified dementia without behavioral disturbance: Secondary | ICD-10-CM | POA: Diagnosis not present

## 2020-04-01 DIAGNOSIS — I10 Essential (primary) hypertension: Secondary | ICD-10-CM | POA: Diagnosis not present

## 2020-04-01 DIAGNOSIS — C61 Malignant neoplasm of prostate: Secondary | ICD-10-CM | POA: Diagnosis not present

## 2020-04-01 DIAGNOSIS — G709 Myoneural disorder, unspecified: Secondary | ICD-10-CM | POA: Diagnosis not present

## 2020-04-01 DIAGNOSIS — G609 Hereditary and idiopathic neuropathy, unspecified: Secondary | ICD-10-CM | POA: Diagnosis not present

## 2020-04-02 ENCOUNTER — Other Ambulatory Visit: Payer: Self-pay | Admitting: *Deleted

## 2020-04-02 NOTE — Patient Outreach (Signed)
[facility]) Care Management  Casey Fisher  04/02/2020   Casey Fisher Nov 04, 1928 [id number] [place] received return telephone call from patient caregiver.  Hipaa compliance verified. Per [name] he has been monitoring the patient blood pressures daily. 03/29 144/77. 03/30 138/71. 03/31 112/52 70, 04/01 111/68 75, 04/02 120/73 72, 04/03 141/70 57, 0404 93/54, 04/05 119/64 70. Patient blood pressure reading are better. Patient has received his COVID vaccines. Patient has not had any falls in March or April. Patient has been walking a lot better since receiving Physical Therapy.  Patient and caregiver have been playing checkers to help keep brain stimulated. Patient and care giver have agreed to follow up outreach calls.   Encounter Medications:  Outpatient Encounter Medications as of 04/02/2020  Medication Sig  . ammonium lactate (AMLACTIN) 12 % lotion Apply to both feet twice daily for dry skin  . aspirin EC 81 MG tablet Take 81 mg by mouth daily. Reported on 05/14/2016  . Calcium Carb-Cholecalciferol (CALCIUM 600 + D PO) Take 1 tablet by mouth daily.  . carbidopa-levodopa (SINEMET IR) 25-100 MG tablet 1/2 tablet three times a day for 4 weeks, then take 1 tablet three times a day  . donepezil (ARICEPT) 5 MG tablet Take 1 tablet (5 mg total) by mouth at bedtime.  . DULoxetine (CYMBALTA) 30 MG capsule Take 1 capsule (30 mg total) by mouth 2 (two) times daily.  . ERLEADA 60 MG tablet Take 120 mg by mouth daily.   Marland Kitchen gabapentin (NEURONTIN) 600 MG tablet TAKE 2 TABLET BY MOUTH IN MORNING AND 2 TABLET AT MIDDAY AND 1 TABLET IN EVENING  . hydrochlorothiazide (HYDRODIURIL) 12.5 MG tablet Take 1 tablet (12.5 mg total) by mouth daily.  Marland Kitchen Leuprolide Acetate (LUPRON IJ) Inject 1 application as directed every 6 (six) months.  . simvastatin (ZOCOR) 40 MG tablet TAKE 1 TABLET BY MOUTH ONCE DAILY  . tamsulosin (FLOMAX) 0.4 MG CAPS capsule Take 0.4 mg daily by mouth.  Reported on 05/14/2016   No facility-administered encounter medications on file as of 04/02/2020.    Functional Status:  In your present state of health, do you have any difficulty performing the following activities: 04/02/2020 10/31/2019  Hearing? Y Y  Comment need hearing aide patient had hearing test and looking at getting hearing aid  Vision? N N  Difficulty concentrating or making decisions? N N  Walking or climbing stairs? Tempie Donning  Comment uses cane or walker uses cane or walker  Dressing or bathing? N N  Doing errands, shopping? Tempie Donning  Comment caregiver takes patient friends take patient to do shopping  Preparing Food and eating ? Y Y  Comment patient has caregivers that prepares his food Friends bring meals and meals on wheels  Using the Toilet? - N  In the past six months, have you accidently leaked urine? Y Y  Do you have problems with loss of bowel control? N N  Managing your Medications? N N  Managing your Finances? N N  Housekeeping or managing your Housekeeping? [place] givers -  Some recent data might be hidden    Fall/Depression Screening: Fall Risk  04/02/2020 01/25/2020 01/19/2020  Falls in the past year? 1 1 0  Comment - - -  Number falls in past yr: 1 1 1   Comment - - -  Injury with Fall? 0 0 0  Risk Factor Category  - - -  Risk for fall due to : History  of fall(s);Impaired balance/gait;Impaired mobility - -  Follow up Falls evaluation completed;Education provided - Falls evaluation completed;Education provided   [facility] 2/9 Scores 01/25/2020 01/19/2020 12/06/2019 10/31/2019 10/05/2019 08/31/2019 08/07/2019  PHQ - 2 Score 0 0 0 0 0 0 0   THN CM Care Plan Problem One     Most Recent Value  Role Documenting the Problem One  [place] for Problem One  Active  THN Long Term Goal   Patient will monitor blood pressure at home and document within the next 90 days  THN Long Term Goal Start Date  04/02/20  Interventions for Problem One Long Term Goal  RN  discussed with caregiver about the blood pressure readings that he is getting. The blood pressure is better. Patient caregiver will continue to document and report.  THN CM Short Term Goal #1   Patient and the caregiver will report montoring his salt intake within the next 30 days  Interventions for Short Term Goal #1  RN reiterated with caregiver about monitoring the sodium intact. Patient has someone that prepares breakfast and dinner. RN will follow up with further encouragment and monitoring of prepartation of meals  THN CM Short Term Goal #2   Patient will follow up Health Maintenance within the next 30 days  Interventions for Short Term Goal #2  RN discussed with patient caregiver about patient receiving the COVID vaccine. Patient needs to continue to follow the pandemic safety precautions. Patient needs to follow up with eye exams and getting hearing aides. RN will follow up for further discussion      Assessment:  Patient has received COVID vaccines Patient appetite is good Patient has a Caregiver [name] that prepares meals Patient has Caregiver [name] that takes to appointments and monitors care Patient needs hearing aides and have had testing  Plan:  Patient will continue pandemic precautions post COVID vaccine Patient will follow up with health maintenance Patient will continue to exercise Caregiver will continue to follow up checking and documenting blood pressures RN Health Coach will follow up outreach within the month of July  Oma Casey Fisher [place] Management [phone]

## 2020-04-03 DIAGNOSIS — G609 Hereditary and idiopathic neuropathy, unspecified: Secondary | ICD-10-CM | POA: Diagnosis not present

## 2020-04-03 DIAGNOSIS — G709 Myoneural disorder, unspecified: Secondary | ICD-10-CM | POA: Diagnosis not present

## 2020-04-03 DIAGNOSIS — G3281 Cerebellar ataxia in diseases classified elsewhere: Secondary | ICD-10-CM | POA: Diagnosis not present

## 2020-04-03 DIAGNOSIS — I10 Essential (primary) hypertension: Secondary | ICD-10-CM | POA: Diagnosis not present

## 2020-04-03 DIAGNOSIS — C61 Malignant neoplasm of prostate: Secondary | ICD-10-CM | POA: Diagnosis not present

## 2020-04-03 DIAGNOSIS — F0390 Unspecified dementia without behavioral disturbance: Secondary | ICD-10-CM | POA: Diagnosis not present

## 2020-04-09 ENCOUNTER — Other Ambulatory Visit: Payer: Self-pay

## 2020-04-09 ENCOUNTER — Telehealth: Payer: Self-pay | Admitting: Neurology

## 2020-04-09 MED ORDER — DONEPEZIL HCL 5 MG PO TABS: 5.0000 mg | ORAL_TABLET | Freq: Every day | ORAL | 1 refills | Status: DC

## 2020-04-09 NOTE — Telephone Encounter (Signed)
1) Medication(s) Requested (by name): donepezil (ARICEPT) 5 MG tablet   2) Pharmacy of Choice: [facility], [address]  [place], [place] [address]

## 2020-04-09 NOTE — Telephone Encounter (Signed)
Aricept sent for pt. Pt last saw Sarah NP.

## 2020-04-10 ENCOUNTER — Other Ambulatory Visit: Payer: Self-pay | Admitting: *Deleted

## 2020-04-10 ENCOUNTER — Telehealth: Payer: Self-pay | Admitting: Family Medicine

## 2020-04-10 ENCOUNTER — Other Ambulatory Visit: Payer: Self-pay | Admitting: Family Medicine

## 2020-04-10 DIAGNOSIS — C61 Malignant neoplasm of prostate: Secondary | ICD-10-CM | POA: Diagnosis not present

## 2020-04-10 DIAGNOSIS — G709 Myoneural disorder, unspecified: Secondary | ICD-10-CM | POA: Diagnosis not present

## 2020-04-10 DIAGNOSIS — G609 Hereditary and idiopathic neuropathy, unspecified: Secondary | ICD-10-CM | POA: Diagnosis not present

## 2020-04-10 DIAGNOSIS — E785 Hyperlipidemia, unspecified: Secondary | ICD-10-CM

## 2020-04-10 DIAGNOSIS — G3281 Cerebellar ataxia in diseases classified elsewhere: Secondary | ICD-10-CM | POA: Diagnosis not present

## 2020-04-10 DIAGNOSIS — F0390 Unspecified dementia without behavioral disturbance: Secondary | ICD-10-CM | POA: Diagnosis not present

## 2020-04-10 DIAGNOSIS — I10 Essential (primary) hypertension: Secondary | ICD-10-CM | POA: Diagnosis not present

## 2020-04-10 MED ORDER — SIMVASTATIN 40 MG PO TABS: 40.0000 mg | ORAL_TABLET | Freq: Every day | ORAL | 0 refills | Status: DC

## 2020-04-10 NOTE — Patient Outreach (Signed)
[facility]) Care Management  04/10/2020  DOLLY DEYO May 08, 1928 [id number]  [place] received  telephone call from patient caregiver [name].  Hipaa compliance verified. He was calling to verify that patient did have simvastatin on his medication list. Per last notes from Dr medication list patient is still on the simvastatin.   Caregiver called pharmacy and noted it had not been refilled since August. Patient lipid panel has increased. Patient caregiver is notifying the Dr for refill.  [place] Care Management [phone]

## 2020-04-10 NOTE — Telephone Encounter (Signed)
Medication Refill - Medication: simvastatin (ZOCOR) 40 MG tablet    Preferred Pharmacy (with phone number or street name):  [facility] [phone] - [place], [address] AT [place] Phone:  [phone]  Fax:  [phone]       Agent: Please be advised that RX refills may take up to 3 business days. We ask that you follow-up with your pharmacy.

## 2020-04-10 NOTE — Telephone Encounter (Signed)
Called pt and lvmtcb to sch in office visit or video visit in regards to his referral to home health services per Dr. Carlota Raspberry. Per Provider pt can't do this appt as a telemed, has to be able to see pt.

## 2020-04-10 NOTE — Telephone Encounter (Signed)
Requested Prescriptions  Pending Prescriptions Disp Refills  . simvastatin (ZOCOR) 40 MG tablet 90 tablet 0    Sig: Take 1 tablet (40 mg total) by mouth daily.     Cardiovascular:  Antilipid - Statins Failed - 04/10/2020 11:43 AM      Failed - Total Cholesterol in normal range and within 360 days    Cholesterol, Total  Date Value Ref Range Status  12/06/2019 203 (H) 100 - 199 mg/dL Final         Passed - LDL in normal range and within 360 days    LDL Chol Calc (NIH)  Date Value Ref Range Status  12/06/2019 131 (H) 0 - 99 mg/dL Final         Passed - HDL in normal range and within 360 days    HDL  Date Value Ref Range Status  12/06/2019 60 >39 mg/dL Final         Passed - Triglycerides in normal range and within 360 days    Triglycerides  Date Value Ref Range Status  12/06/2019 69 0 - 149 mg/dL Final         Passed - Patient is not pregnant      Passed - Valid encounter within last 12 months    Recent Outpatient Visits          2 months ago Fall at home, subsequent encounter   [facility], Ranell Patrick, MD   2 months ago Essential hypertension   [facility], Ranell Patrick, MD   3 months ago Essential hypertension   [facility], Ranell Patrick, MD   4 months ago Choking, initial encounter   [facility], Ranell Patrick, MD   6 months ago Physical deconditioning   [facility], Ranell Patrick, MD

## 2020-04-11 DIAGNOSIS — G609 Hereditary and idiopathic neuropathy, unspecified: Secondary | ICD-10-CM | POA: Diagnosis not present

## 2020-04-11 DIAGNOSIS — I10 Essential (primary) hypertension: Secondary | ICD-10-CM | POA: Diagnosis not present

## 2020-04-11 DIAGNOSIS — F0390 Unspecified dementia without behavioral disturbance: Secondary | ICD-10-CM | POA: Diagnosis not present

## 2020-04-11 DIAGNOSIS — C61 Malignant neoplasm of prostate: Secondary | ICD-10-CM | POA: Diagnosis not present

## 2020-04-11 DIAGNOSIS — G3281 Cerebellar ataxia in diseases classified elsewhere: Secondary | ICD-10-CM | POA: Diagnosis not present

## 2020-04-11 DIAGNOSIS — G709 Myoneural disorder, unspecified: Secondary | ICD-10-CM | POA: Diagnosis not present

## 2020-04-12 ENCOUNTER — Telehealth: Payer: Self-pay | Admitting: Neurology

## 2020-04-12 DIAGNOSIS — C61 Malignant neoplasm of prostate: Secondary | ICD-10-CM | POA: Diagnosis not present

## 2020-04-12 DIAGNOSIS — G709 Myoneural disorder, unspecified: Secondary | ICD-10-CM | POA: Diagnosis not present

## 2020-04-12 DIAGNOSIS — G3281 Cerebellar ataxia in diseases classified elsewhere: Secondary | ICD-10-CM | POA: Diagnosis not present

## 2020-04-12 DIAGNOSIS — G609 Hereditary and idiopathic neuropathy, unspecified: Secondary | ICD-10-CM | POA: Diagnosis not present

## 2020-04-12 DIAGNOSIS — I10 Essential (primary) hypertension: Secondary | ICD-10-CM | POA: Diagnosis not present

## 2020-04-12 DIAGNOSIS — F0390 Unspecified dementia without behavioral disturbance: Secondary | ICD-10-CM | POA: Diagnosis not present

## 2020-04-12 NOTE — Telephone Encounter (Signed)
Minda from [facility] called wanting to inform the provider that they are discharging the pt today. Pt has met goals.

## 2020-05-21 ENCOUNTER — Other Ambulatory Visit: Payer: Self-pay | Admitting: Family Medicine

## 2020-05-21 DIAGNOSIS — I10 Essential (primary) hypertension: Secondary | ICD-10-CM

## 2020-05-21 DIAGNOSIS — G629 Polyneuropathy, unspecified: Secondary | ICD-10-CM

## 2020-05-21 NOTE — Telephone Encounter (Signed)
Requested Prescriptions  Pending Prescriptions Disp Refills  . gabapentin (NEURONTIN) 600 MG tablet [Pharmacy Med Name: GABAPENTIN 600MG  TABLETS] 450 tablet 1    Sig: TAKE 2 TABLETS BY MOUTH IN THE MORNING AND TAKE 2 TABLETS BY MOUTH AT MIDDAY AND TAKE 1 TABLET IN THE EVENING     Neurology: Anticonvulsants - gabapentin Passed - 05/21/2020  4:17 PM      Passed - Valid encounter within last 12 months    Recent Outpatient Visits          3 months ago Fall at home, subsequent encounter   [facility], Ranell Patrick, MD   4 months ago Essential hypertension   [facility] [place], MD   5 months ago Essential hypertension   [facility], Ranell Patrick, MD   5 months ago Choking, initial encounter   [facility] [place], MD   7 months ago Physical deconditioning   [facility], Ranell Patrick, MD      Future Appointments            In 2 days Garald Balding, MD [facility]

## 2020-05-22 ENCOUNTER — Other Ambulatory Visit: Payer: Self-pay | Admitting: *Deleted

## 2020-05-22 MED ORDER — DULOXETINE HCL 30 MG PO CPEP: 30.0000 mg | ORAL_CAPSULE | Freq: Two times a day (BID) | ORAL | 1 refills | Status: DC

## 2020-05-23 ENCOUNTER — Ambulatory Visit (INDEPENDENT_AMBULATORY_CARE_PROVIDER_SITE_OTHER): Payer: Medicare Other

## 2020-05-23 ENCOUNTER — Other Ambulatory Visit: Payer: Self-pay

## 2020-05-23 ENCOUNTER — Encounter: Payer: Self-pay | Admitting: Orthopaedic Surgery

## 2020-05-23 ENCOUNTER — Other Ambulatory Visit: Payer: Self-pay | Admitting: Family Medicine

## 2020-05-23 ENCOUNTER — Ambulatory Visit (INDEPENDENT_AMBULATORY_CARE_PROVIDER_SITE_OTHER): Payer: Medicare Other | Admitting: Orthopaedic Surgery

## 2020-05-23 VITALS — Ht 74.5 in | Wt 177.0 lb

## 2020-05-23 DIAGNOSIS — G8929 Other chronic pain: Secondary | ICD-10-CM

## 2020-05-23 DIAGNOSIS — M1711 Unilateral primary osteoarthritis, right knee: Secondary | ICD-10-CM | POA: Insufficient documentation

## 2020-05-23 DIAGNOSIS — M25561 Pain in right knee: Secondary | ICD-10-CM

## 2020-05-23 DIAGNOSIS — I10 Essential (primary) hypertension: Secondary | ICD-10-CM

## 2020-05-23 MED ORDER — LIDOCAINE HCL 1 % IJ SOLN
2.0000 mL | INTRAMUSCULAR | Status: AC | PRN
  Administered 2020-05-23: 2 mL

## 2020-05-23 MED ORDER — METHYLPREDNISOLONE ACETATE 40 MG/ML IJ SUSP
80.0000 mg | INTRAMUSCULAR | Status: AC | PRN
  Administered 2020-05-23: 80 mg via INTRA_ARTICULAR

## 2020-05-23 MED ORDER — BUPIVACAINE HCL 0.25 % IJ SOLN
2.0000 mL | INTRAMUSCULAR | Status: AC | PRN
Start: 2020-05-23 — End: 2020-05-23
  Administered 2020-05-23: 2 mL via INTRA_ARTICULAR

## 2020-05-23 NOTE — Progress Notes (Signed)
Office Visit Note   Patient: Casey Fisher           Date of Birth: Mar 10, 1928           MRN: [id number] Visit Date: 05/23/2020              Requested by: [name], MD [address] [place],  [place] [address] PCP: [name], MD   Assessment & Plan: Visit Diagnoses:  1. Chronic pain of right knee   2. Primary osteoarthritis of right knee     Plan:  #1: Advanced DJD of the right knee  Follow-Up Instructions: Return if symptoms worsen or fail to improve.   Orders:  Orders Placed This Encounter  Procedures  . Large Joint Inj  . XR Knee 1-2 Views Right   No orders of the defined types were placed in this encounter.     Procedures: Large Joint Inj: R knee on 05/23/2020 3:08 PM Indications: pain and diagnostic evaluation Details: 25 G 1.5 in needle, anteromedial approach  Arthrogram: No  Medications: 2 mL lidocaine 1 %; 80 mg methylPREDNISolone acetate 40 MG/ML; 2 mL bupivacaine 0.25 % Outcome: tolerated well, no immediate complications Procedure, treatment alternatives, risks and benefits explained, specific risks discussed. Consent was given by the patient. Immediately prior to procedure a time out was called to verify the correct patient, procedure, equipment, support staff and site/side marked as required. Patient was prepped and draped in the usual sterile fashion.       Clinical Data: No additional findings.   Subjective: Chief Complaint  Patient presents with  . Right Knee - Pain   HPI Patient presents today for right knee pain. He said that it is "crooked". His pain is located all throughout the knee. The pain is constant, but worse with weightbearing. He walks with the assistance of a walker. No grinding. He fell Tuesday but states that he does not know how. He takes Gabapentin for pain. No previous knee surgery.    Review of Systems  Constitutional: Positive for fatigue.  HENT: Negative for trouble swallowing.   Eyes: Negative  for pain.  Respiratory: Negative for shortness of breath.   Cardiovascular: Positive for leg swelling.  Gastrointestinal: Negative for constipation.  Endocrine: Positive for heat intolerance.  Genitourinary: Negative for difficulty urinating.  Musculoskeletal: Positive for back pain and gait problem.  Skin: Negative for rash.  Allergic/Immunologic: Negative for food allergies.  Neurological: Positive for weakness and numbness.  Hematological: Does not bruise/bleed easily.  Psychiatric/Behavioral: Negative for sleep disturbance.     Objective: Vital Signs: Ht 6' 2.5" (1.892 m)   Wt 177 lb (80.3 kg)   BMI 22.42 kg/m   Physical Exam Constitutional:      Appearance: He is well-developed and normal weight.  HENT:     Head: Normocephalic.     Nose: Nose normal.  Eyes:     Pupils: Pupils are equal, round, and reactive to light.  Pulmonary:     Effort: Pulmonary effort is normal.  Skin:    General: Skin is warm and dry.  Neurological:     Mental Status: He is alert and oriented to person, place, and time.  Psychiatric:        Behavior: Behavior normal.     Ortho Exam  Right knee today reveals loss of at least 10 to 15 degrees of full extension with flexion to about 102 degrees.  He does have crepitance with range of motion.  With valgus stressing  he does open medially.  Does have some diffuse tenderness about the knee.  Trace to 1+ effusion.  No warmth or erythema.  No pain with motion of his hips.  Calf is supple and nontender.  Specialty Comments:  No specialty comments available.  Imaging: XR Knee 1-2 Views Right  Result Date: 05/23/2020 2 view x-ray of the right knee reveals joint space narrowing medially. With periarticular spurring tricompartment.  Sclerosing more medial than lateral.    PMFS History: Current Outpatient Medications  Medication Sig Dispense Refill  . ammonium lactate (AMLACTIN) 12 % lotion Apply to both feet twice daily for dry skin 396 g 4  .  aspirin EC 81 MG tablet Take 81 mg by mouth daily. Reported on 05/14/2016    . Calcium Carb-Cholecalciferol (CALCIUM 600 + D PO) Take 1 tablet by mouth daily.    . carbidopa-levodopa (SINEMET IR) 25-100 MG tablet 1/2 tablet three times a day for 4 weeks, then take 1 tablet three times a day 90 tablet 3  . donepezil (ARICEPT) 5 MG tablet Take 1 tablet (5 mg total) by mouth at bedtime. 90 tablet 1  . DULoxetine (CYMBALTA) 30 MG capsule Take 1 capsule (30 mg total) by mouth 2 (two) times daily. 180 capsule 1  . ERLEADA 60 MG tablet Take 120 mg by mouth daily.     Marland Kitchen gabapentin (NEURONTIN) 600 MG tablet TAKE 2 TABLETS BY MOUTH IN THE MORNING AND TAKE 2 TABLETS BY MOUTH AT MIDDAY AND TAKE 1 TABLET IN THE EVENING 450 tablet 1  . hydrochlorothiazide (HYDRODIURIL) 12.5 MG tablet TAKE 1 TABLET(12.5 MG) BY MOUTH DAILY 90 tablet 1  . Leuprolide Acetate (LUPRON IJ) Inject 1 application as directed every 6 (six) months.    . simvastatin (ZOCOR) 40 MG tablet Take 1 tablet (40 mg total) by mouth daily. 90 tablet 0  . tamsulosin (FLOMAX) 0.4 MG CAPS capsule Take 0.4 mg daily by mouth. Reported on 05/14/2016  1   No current facility-administered medications for this visit.    Patient Active Problem List   Diagnosis Date Noted  . Primary osteoarthritis of right knee 05/23/2020  . Low back pain 08/03/2019  . Acute pain of left shoulder 10/10/2018  . Chest pain 11/08/2017  . Memory difficulty 03/24/2016  . Hereditary and idiopathic peripheral neuropathy 03/24/2016  . Abnormality of gait 03/24/2016  . Peripheral neuropathy 04/18/2015  . Hyperlipidemia 04/18/2015  . Prostate cancer ([place]) 08/24/2014  . Right inguinal hernia 10/30/2013   Past Medical History:  Diagnosis Date  . Abnormality of gait 03/24/2016  . Anxiety   . Cancer ([place])   . Cataract   . Depression   . Heart murmur   . Hereditary and idiopathic peripheral neuropathy 03/24/2016  . Hypertension   . Memory difficulty 03/24/2016  . Neuromuscular  disorder ([place])   . Neuropathy   . Prostate cancer ([place])    remission    Family History  Problem Relation Age of Onset  . Brain cancer Daughter   . Heart disease Mother   . Stroke Mother   . Heart disease Father     Past Surgical History:  Procedure Laterality Date  . CATARACT EXTRACTION Bilateral   . EYE SURGERY    . HERNIA REPAIR  1985  . INGUINAL HERNIA REPAIR Right 12/06/2013   Procedure: HERNIA REPAIR INGUINAL ADULT;  Surgeon: Merrie Roof, MD;  Location: [facility];  Service: General;  Laterality: Right;  . INSERTION OF MESH Right 12/06/2013  Procedure: INSERTION OF MESH;  Surgeon: Merrie Roof, MD;  Location: [facility];  Service: General;  Laterality: Right;  . PROSTATE SURGERY     Social History   Occupational History  . Not on file  Tobacco Use  . Smoking status: Former Research scientist (life sciences)  . Smokeless tobacco: Never Used  Substance and Sexual Activity  . Alcohol use: No  . Drug use: No  . Sexual activity: Not on file

## 2020-05-23 NOTE — Telephone Encounter (Signed)
Requested Prescriptions  Pending Prescriptions Disp Refills  . hydrochlorothiazide (HYDRODIURIL) 12.5 MG tablet [Pharmacy Med Name: HYDROCHLOROTHIAZIDE 12.5MG  TABLETS] 90 tablet 1    Sig: TAKE 1 TABLET(12.5 MG) BY MOUTH DAILY     Cardiovascular: Diuretics - Thiazide Failed - 05/23/2020  8:31 AM      Failed - Na in normal range and within 360 days    Sodium  Date Value Ref Range Status  12/06/2019 145 (H) 134 - 144 mmol/L Final         Failed - Last BP in normal range    BP Readings from Last 1 Encounters:  02/02/20 (!) 145/73         Passed - Ca in normal range and within 360 days    Calcium  Date Value Ref Range Status  12/06/2019 9.0 8.6 - 10.2 mg/dL Final   Calcium, Ion  Date Value Ref Range Status  05/29/2012 1.20 1.12 - 1.32 mmol/L Final         Passed - Cr in normal range and within 360 days    Creat  Date Value Ref Range Status  03/11/2016 0.95 0.70 - 1.11 mg/dL Final   Creatinine, Ser  Date Value Ref Range Status  12/06/2019 1.03 0.76 - 1.27 mg/dL Final         Passed - K in normal range and within 360 days    Potassium  Date Value Ref Range Status  12/06/2019 3.7 3.5 - 5.2 mmol/L Final         Passed - Valid encounter within last 6 months    Recent Outpatient Visits          3 months ago Fall at home, subsequent encounter   [facility] [place], MD   4 months ago Essential hypertension   [facility] [place], MD   5 months ago Essential hypertension   [facility] [place], MD   5 months ago Choking, initial encounter   [facility] [place], MD   7 months ago Physical deconditioning   [facility], Ranell Patrick, MD      Future Appointments            Today Garald Balding, MD [facility]

## 2020-05-28 ENCOUNTER — Ambulatory Visit: Payer: Medicare Other | Admitting: Neurology

## 2020-05-28 ENCOUNTER — Encounter: Payer: Self-pay | Admitting: Neurology

## 2020-05-28 NOTE — Progress Notes (Deleted)
PATIENT: Casey Fisher DOB: December 28, 1928  REASON FOR VISIT: follow up HISTORY FROM: patient  HISTORY OF PRESENT ILLNESS: Today 05/28/20 Casey Fisher is a 86 year old male with history of peripheral neuropathy, chronic gait disorder, and progressive memory disturbance.  When last seen, he was noted to have some parkinsonian features (impaired superior gaze, leaning to the left, walking with knees flexed, slowness of movement), placed on Sinemet 25/100 mg, working up to 1 tablet 3 times a day.  CT head showed moderate level of small vessel ischemic changes, could be an additive factor for balance.  He is in physical therapy.  HISTORY 02/02/2020 Dr. [name]: Casey Fisher is an 86 year old right-handed black male who has been followed through this office for a number of years.  In 2008, he was diagnosed with a peripheral neuropathy, he has had a chronic gait disorder over many years.  More recently, he has had a significant decline in his ability to ambulate, he is shuffling his feet, he is walking with his knees flexed, he now has tendency to lean to the left.  The patient fell yesterday, but he fell asleep while sitting in a chair and fell over.  He uses a cane or a walker in the home environment.  He underwent physical therapy about 4 months ago according to the family members, he seemed to improve with this but then worsened as soon as the physical therapy stopped.  The patient does not drool, he has not had problems with swallowing.  He denies neck pain or low back pain or pain down the arms or legs.  He has some numbness of the right hand and numbness in the feet.  He denies issues controlling the bowels or the bladder.  He is sleeping well at night.  He is sent back to this office for further evaluation of the walking problem.  He has been followed through this office with a progressive memory disturbance.   REVIEW OF SYSTEMS: Out of a complete 14 system review of symptoms, the patient  complains only of the following symptoms, and all other reviewed systems are negative.  ALLERGIES: Allergies  Allergen Reactions  . Lyrica [Pregabalin] Other (See Comments)    "constipation"    HOME MEDICATIONS: Outpatient Medications Prior to Visit  Medication Sig Dispense Refill  . ammonium lactate (AMLACTIN) 12 % lotion Apply to both feet twice daily for dry skin 396 g 4  . aspirin EC 81 MG tablet Take 81 mg by mouth daily. Reported on 05/14/2016    . Calcium Carb-Cholecalciferol (CALCIUM 600 + D PO) Take 1 tablet by mouth daily.    . carbidopa-levodopa (SINEMET IR) 25-100 MG tablet 1/2 tablet three times a day for 4 weeks, then take 1 tablet three times a day 90 tablet 3  . donepezil (ARICEPT) 5 MG tablet Take 1 tablet (5 mg total) by mouth at bedtime. 90 tablet 1  . DULoxetine (CYMBALTA) 30 MG capsule Take 1 capsule (30 mg total) by mouth 2 (two) times daily. 180 capsule 1  . ERLEADA 60 MG tablet Take 120 mg by mouth daily.     Marland Kitchen gabapentin (NEURONTIN) 600 MG tablet TAKE 2 TABLETS BY MOUTH IN THE MORNING AND TAKE 2 TABLETS BY MOUTH AT MIDDAY AND TAKE 1 TABLET IN THE EVENING 450 tablet 1  . hydrochlorothiazide (HYDRODIURIL) 12.5 MG tablet TAKE 1 TABLET(12.5 MG) BY MOUTH DAILY 90 tablet 1  . Leuprolide Acetate (LUPRON IJ) Inject 1 application as directed every 6 (six) months.    Marland Kitchen  simvastatin (ZOCOR) 40 MG tablet Take 1 tablet (40 mg total) by mouth daily. 90 tablet 0  . tamsulosin (FLOMAX) 0.4 MG CAPS capsule Take 0.4 mg daily by mouth. Reported on 05/14/2016  1   No facility-administered medications prior to visit.    PAST MEDICAL HISTORY: Past Medical History:  Diagnosis Date  . Abnormality of gait 03/24/2016  . Anxiety   . Cancer ([place])   . Cataract   . Depression   . Heart murmur   . Hereditary and idiopathic peripheral neuropathy 03/24/2016  . Hypertension   . Memory difficulty 03/24/2016  . Neuromuscular disorder ([place])   . Neuropathy   . Prostate cancer ([place])     remission    PAST SURGICAL HISTORY: Past Surgical History:  Procedure Laterality Date  . CATARACT EXTRACTION Bilateral   . EYE SURGERY    . HERNIA REPAIR  1985  . INGUINAL HERNIA REPAIR Right 12/06/2013   Procedure: HERNIA REPAIR INGUINAL ADULT;  Surgeon: Merrie Roof, MD;  Location: [place];  Service: General;  Laterality: Right;  . INSERTION OF MESH Right 12/06/2013   Procedure: INSERTION OF MESH;  Surgeon: Merrie Roof, MD;  Location: [facility];  Service: General;  Laterality: Right;  . PROSTATE SURGERY      FAMILY HISTORY: Family History  Problem Relation Age of Onset  . Brain cancer Daughter   . Heart disease Mother   . Stroke Mother   . Heart disease Father     SOCIAL HISTORY: Social History   Socioeconomic History  . Marital status: Widowed    Spouse name: Not on file  . Number of children: Not on file  . Years of education: Not on file  . Highest education level: Not on file  Occupational History  . Not on file  Tobacco Use  . Smoking status: Former Research scientist (life sciences)  . Smokeless tobacco: Never Used  Substance and Sexual Activity  . Alcohol use: No  . Drug use: No  . Sexual activity: Not on file  Other Topics Concern  . Not on file  Social History Narrative   Lives home alone.  Retired.  Widowed.  % children.     Social Determinants of Health   Financial Resource Strain:   . Difficulty of Paying Living Expenses:   Food Insecurity: No Food Insecurity  . Worried About Charity fundraiser in the Last Year: Never true  . Ran Out of Food in the Last Year: Never true  Transportation Needs: No Transportation Needs  . Lack of Transportation (Medical): No  . Lack of Transportation (Non-Medical): No  Physical Activity:   . Days of Exercise per Week:   . Minutes of Exercise per Session:   Stress:   . Feeling of Stress :   Social Connections: Slightly Isolated  . Frequency of Communication with Friends and Family: More than three times a week  . Frequency of Social  Gatherings with Friends and Family: More than three times a week  . Attends Religious Services: More than 4 times per year  . Active Member of Clubs or Organizations: Yes  . Attends Archivist Meetings: More than 4 times per year  . Marital Status: Widowed  Intimate Partner Violence:   . Fear of Current or Ex-Partner:   . Emotionally Abused:   Marland Kitchen Physically Abused:   . Sexually Abused:       PHYSICAL EXAM  There were no vitals filed for this visit. There is no height  or weight on file to calculate BMI.  Generalized: Well developed, in no acute distress   Neurological examination  Mentation: Alert oriented to time, place, history taking. Follows all commands speech and language fluent Cranial nerve II-XII: Pupils were equal round reactive to light. Extraocular movements were full, visual field were full on confrontational test. Facial sensation and strength were normal. Uvula tongue midline. Head turning and shoulder shrug  were normal and symmetric. Motor: The motor testing reveals 5 over 5 strength of all 4 extremities. Good symmetric motor tone is noted throughout.  Sensory: Sensory testing is intact to soft touch on all 4 extremities. No evidence of extinction is noted.  Coordination: Cerebellar testing reveals good finger-nose-finger and heel-to-shin bilaterally.  Gait and station: Gait is normal. Tandem gait is normal. Romberg is negative. No drift is seen.  Reflexes: Deep tendon reflexes are symmetric and normal bilaterally.   DIAGNOSTIC DATA (LABS, IMAGING, TESTING) - I reviewed patient records, labs, notes, testing and imaging myself where available.  Lab Results  Component Value Date   WBC 5.4 11/08/2017   HGB 12.1 (L) 11/08/2017   HCT 38.5 (L) 11/08/2017   MCV 88.7 11/08/2017   PLT 171 11/08/2017      Component Value Date/Time   NA 145 (H) 12/06/2019 1526   K 3.7 12/06/2019 1526   CL 104 12/06/2019 1526   CO2 25 12/06/2019 1526   GLUCOSE 83 12/06/2019  1526   GLUCOSE 97 11/08/2017 0347   BUN 17 12/06/2019 1526   CREATININE 1.03 12/06/2019 1526   CREATININE 0.95 03/11/2016 1407   CALCIUM 9.0 12/06/2019 1526   PROT 6.8 12/06/2019 1526   ALBUMIN 4.1 12/06/2019 1526   AST 16 12/06/2019 1526   ALT 7 12/06/2019 1526   ALKPHOS 88 12/06/2019 1526   BILITOT 0.3 12/06/2019 1526   GFRNONAA 64 12/06/2019 1526   GFRNONAA 72 03/11/2016 1407   GFRAA 74 12/06/2019 1526   GFRAA 83 03/11/2016 1407   Lab Results  Component Value Date   CHOL 203 (H) 12/06/2019   HDL 60 12/06/2019   LDLCALC 131 (H) 12/06/2019   TRIG 69 12/06/2019   CHOLHDL 3.4 12/06/2019   Lab Results  Component Value Date   HGBA1C 5.4 04/06/2014   Lab Results  Component Value Date   VITAMINB12 247 02/02/2020   Lab Results  Component Value Date   TSH 4.020 08/07/2019      ASSESSMENT AND PLAN 86 y.o. year old male  has a past medical history of Abnormality of gait (03/24/2016), Anxiety, Cancer ([place]), Cataract, Depression, Heart murmur, Hereditary and idiopathic peripheral neuropathy (03/24/2016), Hypertension, Memory difficulty (03/24/2016), Neuromuscular disorder ([place]), Neuropathy, and Prostate cancer ([place]). here with ***   I spent 15 minutes with the patient. 50% of this time was spent   [name] Denmark, [place], DNP 05/28/2020, 5:48 AM [facility] [address], [place], [place] [address] [phone]

## 2020-05-30 ENCOUNTER — Other Ambulatory Visit: Payer: Self-pay | Admitting: Neurology

## 2020-05-30 DIAGNOSIS — C61 Malignant neoplasm of prostate: Secondary | ICD-10-CM | POA: Diagnosis not present

## 2020-06-04 ENCOUNTER — Ambulatory Visit (INDEPENDENT_AMBULATORY_CARE_PROVIDER_SITE_OTHER): Payer: Medicare Other | Admitting: Neurology

## 2020-06-04 ENCOUNTER — Other Ambulatory Visit: Payer: Self-pay

## 2020-06-04 ENCOUNTER — Encounter: Payer: Self-pay | Admitting: Neurology

## 2020-06-04 VITALS — BP 135/65 | HR 61 | Ht 74.0 in | Wt 177.0 lb

## 2020-06-04 DIAGNOSIS — R269 Unspecified abnormalities of gait and mobility: Secondary | ICD-10-CM

## 2020-06-04 DIAGNOSIS — G6289 Other specified polyneuropathies: Secondary | ICD-10-CM

## 2020-06-04 DIAGNOSIS — R413 Other amnesia: Secondary | ICD-10-CM

## 2020-06-04 MED ORDER — CARBIDOPA-LEVODOPA 25-100 MG PO TABS: ORAL_TABLET | ORAL | 5 refills | Status: DC

## 2020-06-04 NOTE — Progress Notes (Signed)
I have read the note, and I agree with the clinical assessment and plan.  Zackarie Chason K Colandra Ohanian   

## 2020-06-04 NOTE — Patient Instructions (Signed)
I will order physical therapy  Continue current medications See you back in 5 months

## 2020-06-04 NOTE — Progress Notes (Signed)
PATIENT: Casey Fisher DOB: [birth year]  REASON FOR VISIT: follow up HISTORY FROM: patient  HISTORY OF PRESENT ILLNESS: Today 06/04/20  Mr. Casey Fisher is a 86 year old male with history of peripheral neuropathy, chronic gait disorder, and memory disturbance.  When last seen, had some parkinsonian features including impaired superior gaze, leaning to the left, walking with knees flexed, and slowness of movement.  He was given a trial on Sinemet working up to 25/100 1 tablet 3 times a day.  CT scan of the brain showed a moderate level of small vessel ischemic changes, could be an additive factor on his balance issues.  Completed physical therapy, no recent falls.  Continues to live alone, 2 neighbors checking on him.  He receives Meals on Wheels.  Unclear if benefit from Sinemet, [name], his neighbor caregiver, feels his walking has improved, if he does what he is supposed to (stand up straight, straighten his knees).  Greatly benefited from physical therapy, but this ran out.  Denies difficulty swallowing or chewing.  No tremor.  He can now walk around the house without a walker, he walked in today with a walker, vs wheelchair at last visit. Memory overall stable.  Presents today for evaluation accompanied by his friend, [name].  HISTORY  02/02/2020 Dr. [name]: Casey Fisher is an 86 year old right-handed black male who has been followed through this office for a number of years.  In 2008, he was diagnosed with a peripheral neuropathy, he has had a chronic gait disorder over many years.  More recently, he has had a significant decline in his ability to ambulate, he is shuffling his feet, he is walking with his knees flexed, he now has tendency to lean to the left.  The patient fell yesterday, but he fell asleep while sitting in a chair and fell over.  He uses a cane or a walker in the home environment.  He underwent physical therapy about 4 months ago according to the family members, he seemed  to improve with this but then worsened as soon as the physical therapy stopped.  The patient does not drool, he has not had problems with swallowing.  He denies neck pain or low back pain or pain down the arms or legs.  He has some numbness of the right hand and numbness in the feet.  He denies issues controlling the bowels or the bladder.  He is sleeping well at night.  He is sent back to this office for further evaluation of the walking problem.  He has been followed through this office with a progressive memory disturbance.  REVIEW OF SYSTEMS: Out of a complete 14 system review of symptoms, the patient complains only of the following symptoms, and all other reviewed systems are negative.  Walking difficulty  ALLERGIES: Allergies  Allergen Reactions   Lyrica [Pregabalin] Other (See Comments)    "constipation"    HOME MEDICATIONS: Outpatient Medications Prior to Visit  Medication Sig Dispense Refill   ammonium lactate (AMLACTIN) 12 % lotion Apply to both feet twice daily for dry skin 396 g 4   aspirin EC 81 MG tablet Take 81 mg by mouth daily. Reported on 05/14/2016     Calcium Carb-Cholecalciferol (CALCIUM 600 + D PO) Take 1 tablet by mouth daily.     carbidopa-levodopa (SINEMET IR) 25-100 MG tablet TAKE 1/2 TABLET BY MOUTH 3 TIMES DAILY FOR 4 WEEKS, THEN 1 TABLET 3 TIMES DAILY 90 tablet 3   donepezil (ARICEPT) 5 MG tablet Take 1 tablet (  5 mg total) by mouth at bedtime. 90 tablet 1   DULoxetine (CYMBALTA) 30 MG capsule Take 1 capsule (30 mg total) by mouth 2 (two) times daily. 180 capsule 1   ERLEADA 60 MG tablet Take 120 mg by mouth daily.      gabapentin (NEURONTIN) 600 MG tablet TAKE 2 TABLETS BY MOUTH IN THE MORNING AND TAKE 2 TABLETS BY MOUTH AT MIDDAY AND TAKE 1 TABLET IN THE EVENING 450 tablet 1   hydrochlorothiazide (HYDRODIURIL) 12.5 MG tablet TAKE 1 TABLET(12.5 MG) BY MOUTH DAILY 90 tablet 1   Leuprolide Acetate (LUPRON IJ) Inject 1 application as directed every 6 (six)  months.     simvastatin (ZOCOR) 40 MG tablet Take 1 tablet (40 mg total) by mouth daily. 90 tablet 0   tamsulosin (FLOMAX) 0.4 MG CAPS capsule Take 0.4 mg daily by mouth. Reported on 05/14/2016  1   No facility-administered medications prior to visit.    PAST MEDICAL HISTORY: Past Medical History:  Diagnosis Date   Abnormality of gait 03/24/2016   Anxiety    Cancer (HCC)    Cataract    Depression    Heart murmur    Hereditary and idiopathic peripheral neuropathy 03/24/2016   Hypertension    Memory difficulty 03/24/2016   Neuromuscular disorder ([place])    Neuropathy    Prostate cancer ([place])    remission    PAST SURGICAL HISTORY: Past Surgical History:  Procedure Laterality Date   CATARACT EXTRACTION Bilateral    EYE SURGERY     HERNIA REPAIR  1985   INGUINAL HERNIA REPAIR Right 12/06/2013   Procedure: HERNIA REPAIR INGUINAL ADULT;  Surgeon: Merrie Roof, MD;  Location: [facility];  Service: General;  Laterality: Right;   INSERTION OF MESH Right 12/06/2013   Procedure: INSERTION OF MESH;  Surgeon: Merrie Roof, MD;  Location: [facility];  Service: General;  Laterality: Right;   PROSTATE SURGERY      FAMILY HISTORY: Family History  Problem Relation Age of Onset   Brain cancer Daughter    Heart disease Mother    Stroke Mother    Heart disease Father     SOCIAL HISTORY: Social History   Socioeconomic History   Marital status: Widowed    Spouse name: Not on file   Number of children: Not on file   Years of education: Not on file   Highest education level: Not on file  Occupational History   Not on file  Tobacco Use   Smoking status: Former Smoker   Smokeless tobacco: Never Used  Substance and Sexual Activity   Alcohol use: No   Drug use: No   Sexual activity: Not on file  Other Topics Concern   Not on file  Social History Narrative   Lives home alone.  Retired.  Widowed.  % children.     Social Determinants of Health    Financial Resource Strain:    Difficulty of Paying Living Expenses:   Food Insecurity: No Food Insecurity   Worried About Charity fundraiser in the Last Year: Never true   Ran Out of Food in the Last Year: Never true  Transportation Needs: No Transportation Needs   Lack of Transportation (Medical): No   Lack of Transportation (Non-Medical): No  Physical Activity:    Days of Exercise per Week:    Minutes of Exercise per Session:   Stress:    Feeling of Stress :   Social Connections: Slightly Isolated  Frequency of Communication with Friends and Family: More than three times a week   Frequency of Social Gatherings with Friends and Family: More than three times a week   Attends Religious Services: More than 4 times per year   Active Member of [employer] or Organizations: Yes   Attends Archivist Meetings: More than 4 times per year   Marital Status: Widowed  Intimate Partner Violence:    Fear of Current or Ex-Partner:    Emotionally Abused:    Physically Abused:    Sexually Abused:    PHYSICAL EXAM  Vitals:   06/04/20 1511  BP: 135/65  Pulse: 61  Weight: 177 lb (80.3 kg)  Height: 6\' 2"  (1.88 m)   Body mass index is 22.73 kg/m. MMSE - Mini Mental State Exam 06/04/2020 11/27/2019 05/25/2019  Orientation to time 3 2 4   Orientation to Place 4 4 5   Registration 3 3 3   Attention/ Calculation 1 1 1   Recall 3 2 2   Language- name 2 objects 2 2 2   Language- repeat 1 0 1  Language- follow 3 step command 3 3 3   Language- read & follow direction 0 1 0  Write a sentence 0 0 0  Copy design 0 0 0  Copy design-comments 7 animals - -  Total score 20 18 21     Generalized: Well developed, in no acute distress  Neurological examination  Mentation: Alert oriented to time, place, history taking. Follows all commands speech and language fluent Cranial nerve II-XII: Pupils were equal round reactive to light. Extraocular movements were full, visual field were full  on confrontational test. Facial sensation and strength were normal. Head turning and shoulder shrug were normal and symmetric.  Mild masking of the face is noted. Motor: The motor testing reveals 5 over 5 strength of all 4 extremities. Good symmetric motor tone is noted throughout.  No tremor noted. Sensory: Sensory testing is intact to soft touch on all 4 extremities. No evidence of extinction is noted.  Coordination: Cerebellar testing reveals good finger-nose-finger and heel-to-shin bilaterally.  Gait and station: Slow to rise, ambulates with knees flexed, forward leaning, short shuffling steps, could walk in the room independently Reflexes: Deep tendon reflexes are symmetric but depressed throughout  DIAGNOSTIC DATA (LABS, IMAGING, TESTING) - I reviewed patient records, labs, notes, testing and imaging myself where available.  Lab Results  Component Value Date   WBC 5.4 11/08/2017   HGB 12.1 (L) 11/08/2017   HCT 38.5 (L) 11/08/2017   MCV 88.7 11/08/2017   PLT 171 11/08/2017      Component Value Date/Time   NA 145 (H) 12/06/2019 1526   K 3.7 12/06/2019 1526   CL 104 12/06/2019 1526   CO2 25 12/06/2019 1526   GLUCOSE 83 12/06/2019 1526   GLUCOSE 97 11/08/2017 0347   BUN 17 12/06/2019 1526   CREATININE 1.03 12/06/2019 1526   CREATININE 0.95 03/11/2016 1407   CALCIUM 9.0 12/06/2019 1526   PROT 6.8 12/06/2019 1526   ALBUMIN 4.1 12/06/2019 1526   AST 16 12/06/2019 1526   ALT 7 12/06/2019 1526   ALKPHOS 88 12/06/2019 1526   BILITOT 0.3 12/06/2019 1526   GFRNONAA 64 12/06/2019 1526   GFRNONAA 72 03/11/2016 1407   GFRAA 74 12/06/2019 1526   GFRAA 83 03/11/2016 1407   Lab Results  Component Value Date   CHOL 203 (H) 12/06/2019   HDL 60 12/06/2019   LDLCALC 131 (H) 12/06/2019   TRIG 69 12/06/2019   CHOLHDL  3.4 12/06/2019   Lab Results  Component Value Date   HGBA1C 5.4 04/06/2014   Lab Results  Component Value Date   VITAMINB12 247 02/02/2020   Lab Results   Component Value Date   TSH 4.020 08/07/2019      ASSESSMENT AND PLAN 86 y.o. year old male  has a past medical history of Abnormality of gait (03/24/2016), Anxiety, Cancer ([place]), Cataract, Depression, Heart murmur, Hereditary and idiopathic peripheral neuropathy (03/24/2016), Hypertension, Memory difficulty (03/24/2016), Neuromuscular disorder ([place]), Neuropathy, and Prostate cancer ([place]). here with:  1.  Peripheral neuropathy 2.  Chronic gait disorder 3.  Memory disorder  The patient was given a trial on Sinemet, the caregiver believes he has improved, can now walk without the walker in his apartment, walked in today with a walker versus wheelchair at last visit.  On exam, he has mild masking of the face, forward leaning, flexed knees, short shuffling gait.  He will remain on Sinemet 25/100 mg 3 times daily (we talked about trying higher dose, we decided to stay at current dose).  I will try to get him set up for another course of physical therapy, he needs continued reinforcement to stand up straight, straighten his knees.  He will remain on Aricept and Cymbalta.  Unclear if this is Parkinson's disease, but caregiver history supports improvement with Sinemet, so we will continue management. Memory is overall stable, MMSE 20/30 (may consider increase Aricept at next visit).  He will follow-up in 4 months or sooner if needed.  I spent 30 minutes of face-to-face and non-face-to-face time with patient.  This included previsit chart review, lab review, study review, order entry, electronic health record documentation, patient education.  [name] Denmark, AGNP-C, DNP 06/04/2020, 3:28 PM [facility] [address] [place], [place] [address] [phone]

## 2020-06-08 DIAGNOSIS — F329 Major depressive disorder, single episode, unspecified: Secondary | ICD-10-CM | POA: Diagnosis not present

## 2020-06-08 DIAGNOSIS — G709 Myoneural disorder, unspecified: Secondary | ICD-10-CM | POA: Diagnosis not present

## 2020-06-08 DIAGNOSIS — G609 Hereditary and idiopathic neuropathy, unspecified: Secondary | ICD-10-CM | POA: Diagnosis not present

## 2020-06-08 DIAGNOSIS — F419 Anxiety disorder, unspecified: Secondary | ICD-10-CM | POA: Diagnosis not present

## 2020-06-08 DIAGNOSIS — Z87891 Personal history of nicotine dependence: Secondary | ICD-10-CM | POA: Diagnosis not present

## 2020-06-08 DIAGNOSIS — Z7982 Long term (current) use of aspirin: Secondary | ICD-10-CM | POA: Diagnosis not present

## 2020-06-08 DIAGNOSIS — I6782 Cerebral ischemia: Secondary | ICD-10-CM | POA: Diagnosis not present

## 2020-06-08 DIAGNOSIS — I10 Essential (primary) hypertension: Secondary | ICD-10-CM | POA: Diagnosis not present

## 2020-06-08 DIAGNOSIS — Z9181 History of falling: Secondary | ICD-10-CM | POA: Diagnosis not present

## 2020-06-08 DIAGNOSIS — Z8546 Personal history of malignant neoplasm of prostate: Secondary | ICD-10-CM | POA: Diagnosis not present

## 2020-06-10 ENCOUNTER — Ambulatory Visit (INDEPENDENT_AMBULATORY_CARE_PROVIDER_SITE_OTHER): Payer: Medicare Other | Admitting: Podiatry

## 2020-06-10 ENCOUNTER — Encounter: Payer: Self-pay | Admitting: Podiatry

## 2020-06-10 ENCOUNTER — Other Ambulatory Visit: Payer: Self-pay

## 2020-06-10 DIAGNOSIS — G629 Polyneuropathy, unspecified: Secondary | ICD-10-CM

## 2020-06-10 DIAGNOSIS — B351 Tinea unguium: Secondary | ICD-10-CM

## 2020-06-10 DIAGNOSIS — M79674 Pain in right toe(s): Secondary | ICD-10-CM | POA: Diagnosis not present

## 2020-06-10 DIAGNOSIS — I739 Peripheral vascular disease, unspecified: Secondary | ICD-10-CM

## 2020-06-10 DIAGNOSIS — M79675 Pain in left toe(s): Secondary | ICD-10-CM | POA: Diagnosis not present

## 2020-06-10 DIAGNOSIS — L84 Corns and callosities: Secondary | ICD-10-CM

## 2020-06-11 DIAGNOSIS — G709 Myoneural disorder, unspecified: Secondary | ICD-10-CM | POA: Diagnosis not present

## 2020-06-11 DIAGNOSIS — I10 Essential (primary) hypertension: Secondary | ICD-10-CM | POA: Diagnosis not present

## 2020-06-11 DIAGNOSIS — F419 Anxiety disorder, unspecified: Secondary | ICD-10-CM | POA: Diagnosis not present

## 2020-06-11 DIAGNOSIS — G609 Hereditary and idiopathic neuropathy, unspecified: Secondary | ICD-10-CM | POA: Diagnosis not present

## 2020-06-11 DIAGNOSIS — I6782 Cerebral ischemia: Secondary | ICD-10-CM | POA: Diagnosis not present

## 2020-06-11 DIAGNOSIS — F329 Major depressive disorder, single episode, unspecified: Secondary | ICD-10-CM | POA: Diagnosis not present

## 2020-06-12 ENCOUNTER — Telehealth: Payer: Self-pay | Admitting: *Deleted

## 2020-06-12 NOTE — Telephone Encounter (Signed)
Schedule AWV.  

## 2020-06-14 DIAGNOSIS — G709 Myoneural disorder, unspecified: Secondary | ICD-10-CM | POA: Diagnosis not present

## 2020-06-14 DIAGNOSIS — I6782 Cerebral ischemia: Secondary | ICD-10-CM | POA: Diagnosis not present

## 2020-06-14 DIAGNOSIS — I10 Essential (primary) hypertension: Secondary | ICD-10-CM | POA: Diagnosis not present

## 2020-06-14 DIAGNOSIS — G609 Hereditary and idiopathic neuropathy, unspecified: Secondary | ICD-10-CM | POA: Diagnosis not present

## 2020-06-14 DIAGNOSIS — F419 Anxiety disorder, unspecified: Secondary | ICD-10-CM | POA: Diagnosis not present

## 2020-06-14 DIAGNOSIS — F329 Major depressive disorder, single episode, unspecified: Secondary | ICD-10-CM | POA: Diagnosis not present

## 2020-06-16 NOTE — Progress Notes (Signed)
Subjective: Casey Fisher is a pleasant 86 y.o. male patient seen today at [facility] foot care with history of peripheral neuropathy and painful callus(es) left foot and painful mycotic toenails b/l that are difficult to trim. Aggravating factors include wearing enclosed shoe gear. Pain is relieved with periodic professional debridement.   His caregiver is present during today's visit.  Past Medical History:  Diagnosis Date   Abnormality of gait 03/24/2016   Anxiety    Cancer ([place])    Cataract    Depression    Heart murmur    Hereditary and idiopathic peripheral neuropathy 03/24/2016   Hypertension    Memory difficulty 03/24/2016   Neuromuscular disorder ([place])    Neuropathy    Prostate cancer ([place])    remission    Patient Active Problem List   Diagnosis Date Noted   Primary osteoarthritis of right knee 05/23/2020   Low back pain 08/03/2019   Acute pain of left shoulder 10/10/2018   Chest pain 11/08/2017   Memory difficulty 03/24/2016   Hereditary and idiopathic peripheral neuropathy 03/24/2016   Abnormality of gait 03/24/2016   Peripheral neuropathy 04/18/2015   Hyperlipidemia 04/18/2015   Prostate cancer ([place]) 08/24/2014   Right inguinal hernia 10/30/2013    Current Outpatient Medications on File Prior to Visit  Medication Sig Dispense Refill   ammonium lactate (AMLACTIN) 12 % lotion Apply to both feet twice daily for dry skin 396 g 4   aspirin EC 81 MG tablet Take 81 mg by mouth daily. Reported on 05/14/2016     Calcium Carb-Cholecalciferol (CALCIUM 600 + D PO) Take 1 tablet by mouth daily.     carbidopa-levodopa (SINEMET IR) 25-100 MG tablet TAKE 1/2 TABLET BY MOUTH 3 TIMES DAILY FOR 4 WEEKS, THEN 1 TABLET 3 TIMES DAILY 90 tablet 5   donepezil (ARICEPT) 5 MG tablet Take 1 tablet (5 mg total) by mouth at bedtime. 90 tablet 1   DULoxetine (CYMBALTA) 30 MG capsule Take 1 capsule (30 mg total) by mouth 2 (two) times daily. 180 capsule 1   ERLEADA  60 MG tablet Take 120 mg by mouth daily.      gabapentin (NEURONTIN) 600 MG tablet TAKE 2 TABLETS BY MOUTH IN THE MORNING AND TAKE 2 TABLETS BY MOUTH AT MIDDAY AND TAKE 1 TABLET IN THE EVENING 450 tablet 1   hydrochlorothiazide (HYDRODIURIL) 12.5 MG tablet TAKE 1 TABLET(12.5 MG) BY MOUTH DAILY 90 tablet 1   Leuprolide Acetate (LUPRON IJ) Inject 1 application as directed every 6 (six) months.     simvastatin (ZOCOR) 40 MG tablet Take 1 tablet (40 mg total) by mouth daily. 90 tablet 0   tamsulosin (FLOMAX) 0.4 MG CAPS capsule Take 0.4 mg daily by mouth. Reported on 05/14/2016  1   No current facility-administered medications on file prior to visit.    Allergies  Allergen Reactions   Lyrica [Pregabalin] Other (See Comments)    "constipation"  Objective: Physical Exam  General: Casey Fisher is a pleasant 86 y.o. African American male, frail, in NAD. AAO x 3.   Vascular:  Capillary refill time to digits immediate b/l. Nonpalpable PT pulse(s) b/l lower extremities. DP pulse palpable left foot. DP pulse palpable right foot. Pedal hair absent. Lower extremity skin temperature gradient within normal limits.  Dermatological:  Pedal skin with normal turgor, texture and tone bilaterally. No open wounds bilaterally. No interdigital macerations bilaterally. Toenails 1-5 b/l elongated, discolored, dystrophic, thickened, crumbly with subungual debris and tenderness to dorsal palpation. Hyperkeratotic lesion(s)  submet head 1 left foot.  No erythema, no edema, no drainage, no flocculence.  Musculoskeletal:  Normal muscle strength 5/5 to all lower extremity muscle groups bilaterally. No pain crepitus or joint limitation noted with ROM b/l. Hallux valgus with bunion deformity noted b/l lower extremities. Hammertoes noted to the b/l lower extremities. Utilizes wheelchair for mobility assistance.  Neurological:  Protective sensation diminished with 10g monofilament b/l.  Assessment and Plan:  1.  Pain due to onychomycosis of toenails of both feet   2. Callus   3. Neuropathy   4. PAD (peripheral artery disease) ([place])    -Examined patient. -No new findings. No new orders. -Toenails 1-5 b/l were debrided in length and girth with sterile nail nippers and dremel without iatrogenic bleeding.  -Callus(es) submet head 1 left foot pared utilizing sterile scalpel blade without complication or incident. Total number debrided =1. -Patient to report any pedal injuries to medical professional immediately. -Patient to continue soft, supportive shoe gear daily. -Patient/POA to call should there be question/concern in the interim.  Return in about 3 months (around 09/10/2020) for nail and callus trim.  Marzetta Board, DPM

## 2020-06-17 ENCOUNTER — Telehealth: Payer: Self-pay | Admitting: Neurology

## 2020-06-17 DIAGNOSIS — R269 Unspecified abnormalities of gait and mobility: Secondary | ICD-10-CM

## 2020-06-17 NOTE — Telephone Encounter (Signed)
Patient's EC [name] called today expressing concerns about patient's at home care. She is requesting a CB from nurse to discuss other options.

## 2020-06-17 NOTE — Addendum Note (Signed)
Addended by: Kathrynn Ducking on: 06/17/2020 04:48 PM   Modules accepted: Orders

## 2020-06-17 NOTE — Telephone Encounter (Signed)
The patient is currently getting physical therapy with home health therapy through [facility], but the family members are not satisfied with the quality of the therapy.  They have had [facility] previously, they wish to switch over to this agency.  I will try to get this set up.

## 2020-06-18 DIAGNOSIS — G709 Myoneural disorder, unspecified: Secondary | ICD-10-CM | POA: Diagnosis not present

## 2020-06-18 DIAGNOSIS — F329 Major depressive disorder, single episode, unspecified: Secondary | ICD-10-CM | POA: Diagnosis not present

## 2020-06-18 DIAGNOSIS — G609 Hereditary and idiopathic neuropathy, unspecified: Secondary | ICD-10-CM | POA: Diagnosis not present

## 2020-06-18 DIAGNOSIS — I6782 Cerebral ischemia: Secondary | ICD-10-CM | POA: Diagnosis not present

## 2020-06-18 DIAGNOSIS — I10 Essential (primary) hypertension: Secondary | ICD-10-CM | POA: Diagnosis not present

## 2020-06-18 DIAGNOSIS — F419 Anxiety disorder, unspecified: Secondary | ICD-10-CM | POA: Diagnosis not present

## 2020-06-21 DIAGNOSIS — F419 Anxiety disorder, unspecified: Secondary | ICD-10-CM | POA: Diagnosis not present

## 2020-06-21 DIAGNOSIS — G609 Hereditary and idiopathic neuropathy, unspecified: Secondary | ICD-10-CM | POA: Diagnosis not present

## 2020-06-21 DIAGNOSIS — I10 Essential (primary) hypertension: Secondary | ICD-10-CM | POA: Diagnosis not present

## 2020-06-21 DIAGNOSIS — I6782 Cerebral ischemia: Secondary | ICD-10-CM | POA: Diagnosis not present

## 2020-06-21 DIAGNOSIS — G709 Myoneural disorder, unspecified: Secondary | ICD-10-CM | POA: Diagnosis not present

## 2020-06-21 DIAGNOSIS — F329 Major depressive disorder, single episode, unspecified: Secondary | ICD-10-CM | POA: Diagnosis not present

## 2020-06-22 ENCOUNTER — Other Ambulatory Visit: Payer: Self-pay | Admitting: Family Medicine

## 2020-06-22 DIAGNOSIS — E785 Hyperlipidemia, unspecified: Secondary | ICD-10-CM

## 2020-06-22 NOTE — Telephone Encounter (Signed)
Requested Prescriptions  Pending Prescriptions Disp Refills  . simvastatin (ZOCOR) 40 MG tablet [Pharmacy Med Name: SIMVASTATIN 40MG  TABLETS] 90 tablet 2    Sig: TAKE 1 TABLET(40 MG) BY MOUTH DAILY     Cardiovascular:  Antilipid - Statins Failed - 06/22/2020  6:43 AM      Failed - Total Cholesterol in normal range and within 360 days    Cholesterol, Total  Date Value Ref Range Status  12/06/2019 203 (H) 100 - 199 mg/dL Final         Failed - LDL in normal range and within 360 days    LDL Chol Calc (NIH)  Date Value Ref Range Status  12/06/2019 131 (H) 0 - 99 mg/dL Final         Passed - HDL in normal range and within 360 days    HDL  Date Value Ref Range Status  12/06/2019 60 >39 mg/dL Final         Passed - Triglycerides in normal range and within 360 days    Triglycerides  Date Value Ref Range Status  12/06/2019 69 0 - 149 mg/dL Final         Passed - Patient is not pregnant      Passed - Valid encounter within last 12 months    Recent Outpatient Visits          4 months ago Fall at home, subsequent encounter   [facility], MD   5 months ago Essential hypertension   [facility], Ranell Patrick, MD   6 months ago Essential hypertension   [facility], Ranell Patrick, MD   6 months ago Choking, initial encounter   [facility], Ranell Patrick, MD   8 months ago Physical deconditioning   [facility], Ranell Patrick, MD

## 2020-06-24 DIAGNOSIS — I10 Essential (primary) hypertension: Secondary | ICD-10-CM | POA: Diagnosis not present

## 2020-06-24 DIAGNOSIS — I6782 Cerebral ischemia: Secondary | ICD-10-CM | POA: Diagnosis not present

## 2020-06-24 DIAGNOSIS — G609 Hereditary and idiopathic neuropathy, unspecified: Secondary | ICD-10-CM | POA: Diagnosis not present

## 2020-06-24 DIAGNOSIS — G709 Myoneural disorder, unspecified: Secondary | ICD-10-CM | POA: Diagnosis not present

## 2020-06-24 DIAGNOSIS — F419 Anxiety disorder, unspecified: Secondary | ICD-10-CM | POA: Diagnosis not present

## 2020-06-24 DIAGNOSIS — F329 Major depressive disorder, single episode, unspecified: Secondary | ICD-10-CM | POA: Diagnosis not present

## 2020-06-27 DIAGNOSIS — F419 Anxiety disorder, unspecified: Secondary | ICD-10-CM | POA: Diagnosis not present

## 2020-06-27 DIAGNOSIS — I6782 Cerebral ischemia: Secondary | ICD-10-CM | POA: Diagnosis not present

## 2020-06-27 DIAGNOSIS — F329 Major depressive disorder, single episode, unspecified: Secondary | ICD-10-CM | POA: Diagnosis not present

## 2020-06-27 DIAGNOSIS — I10 Essential (primary) hypertension: Secondary | ICD-10-CM | POA: Diagnosis not present

## 2020-06-27 DIAGNOSIS — G709 Myoneural disorder, unspecified: Secondary | ICD-10-CM | POA: Diagnosis not present

## 2020-06-27 DIAGNOSIS — G609 Hereditary and idiopathic neuropathy, unspecified: Secondary | ICD-10-CM | POA: Diagnosis not present

## 2020-07-08 ENCOUNTER — Encounter: Payer: Self-pay | Admitting: *Deleted

## 2020-07-08 ENCOUNTER — Other Ambulatory Visit: Payer: Self-pay | Admitting: *Deleted

## 2020-07-08 DIAGNOSIS — G709 Myoneural disorder, unspecified: Secondary | ICD-10-CM | POA: Diagnosis not present

## 2020-07-08 DIAGNOSIS — Z8546 Personal history of malignant neoplasm of prostate: Secondary | ICD-10-CM | POA: Diagnosis not present

## 2020-07-08 DIAGNOSIS — G609 Hereditary and idiopathic neuropathy, unspecified: Secondary | ICD-10-CM | POA: Diagnosis not present

## 2020-07-08 DIAGNOSIS — F329 Major depressive disorder, single episode, unspecified: Secondary | ICD-10-CM | POA: Diagnosis not present

## 2020-07-08 DIAGNOSIS — I10 Essential (primary) hypertension: Secondary | ICD-10-CM | POA: Diagnosis not present

## 2020-07-08 DIAGNOSIS — Z7982 Long term (current) use of aspirin: Secondary | ICD-10-CM | POA: Diagnosis not present

## 2020-07-08 DIAGNOSIS — Z87891 Personal history of nicotine dependence: Secondary | ICD-10-CM | POA: Diagnosis not present

## 2020-07-08 DIAGNOSIS — Z9181 History of falling: Secondary | ICD-10-CM | POA: Diagnosis not present

## 2020-07-08 DIAGNOSIS — I6782 Cerebral ischemia: Secondary | ICD-10-CM | POA: Diagnosis not present

## 2020-07-08 DIAGNOSIS — F419 Anxiety disorder, unspecified: Secondary | ICD-10-CM | POA: Diagnosis not present

## 2020-07-08 NOTE — Patient Outreach (Signed)
[place] [facility]) Care Management  [place]  07/08/2020   WIRT HEMMERICH 1928-04-02 [id number]  Subjective:   Objective:   Encounter Medications:  Outpatient Encounter Medications as of 07/08/2020  Medication Sig   ammonium lactate (AMLACTIN) 12 % lotion Apply to both feet twice daily for dry skin   aspirin EC 81 MG tablet Take 81 mg by mouth daily. Reported on 05/14/2016   Calcium Carb-Cholecalciferol (CALCIUM 600 + D PO) Take 1 tablet by mouth daily.   carbidopa-levodopa (SINEMET IR) 25-100 MG tablet TAKE 1/2 TABLET BY MOUTH 3 TIMES DAILY FOR 4 WEEKS, THEN 1 TABLET 3 TIMES DAILY   donepezil (ARICEPT) 5 MG tablet Take 1 tablet (5 mg total) by mouth at bedtime.   DULoxetine (CYMBALTA) 30 MG capsule Take 1 capsule (30 mg total) by mouth 2 (two) times daily.   ERLEADA 60 MG tablet Take 120 mg by mouth daily.    gabapentin (NEURONTIN) 600 MG tablet TAKE 2 TABLETS BY MOUTH IN THE MORNING AND TAKE 2 TABLETS BY MOUTH AT MIDDAY AND TAKE 1 TABLET IN THE EVENING   hydrochlorothiazide (HYDRODIURIL) 12.5 MG tablet TAKE 1 TABLET(12.5 MG) BY MOUTH DAILY   Leuprolide Acetate (LUPRON IJ) Inject 1 application as directed every 6 (six) months.   simvastatin (ZOCOR) 40 MG tablet TAKE 1 TABLET(40 MG) BY MOUTH DAILY   tamsulosin (FLOMAX) 0.4 MG CAPS capsule Take 0.4 mg daily by mouth. Reported on 05/14/2016   No facility-administered encounter medications on file as of 07/08/2020.    Functional Status:  In your present state of health, do you have any difficulty performing the following activities: 04/02/2020 10/31/2019  Hearing? Y Y  Comment need hearing aide patient had hearing test and looking at getting hearing aid  Vision? N N  Difficulty concentrating or making decisions? N N  Walking or climbing stairs? Tempie Donning  Comment uses cane or walker uses cane or walker  Dressing or bathing? N N  Doing errands, shopping? Tempie Donning  Comment caregiver takes patient friends take  patient to do shopping  Preparing Food and eating ? Y Y  Comment patient has caregivers that prepares his food Friends bring meals and meals on wheels  Using the Toilet? - N  In the past six months, have you accidently leaked urine? Y Y  Do you have problems with loss of bowel control? N N  Managing your Medications? N N  Managing your Finances? N N  Housekeeping or managing your Housekeeping? [place] givers -  Some recent data might be hidden    Fall/Depression Screening: Fall Risk  07/08/2020 04/02/2020 01/25/2020  Falls in the past year? _0 Comment - - -  Number falls in past yr: _1 Comment - - -  Injury with Fall? 0 0 0  Risk Factor Category  - - -  Risk for fall due to : History of fall(s);Impaired balance/gait;Impaired mobility History of fall(s);Impaired balance/gait;Impaired mobility -  Follow up Falls evaluation completed;Falls prevention discussed Falls evaluation completed;Education provided -   [facility] 2/9 Scores 01/25/2020 01/19/2020 12/06/2019 10/31/2019 10/05/2019 08/31/2019 08/07/2019  PHQ - 2 Score 0 0 0 0 0 0 0   Goals Addressed            This Visit's Progress    Blood Pressure < 140/90       CARE PLAN ENTRY (see longtitudinal plan of care for additional care plan information)  Objective:  Last practice recorded BP readings:  BP Readings from Last 3 Encounters:  06/04/20 135/65  02/02/20 (!) 145/73  01/25/20 132/66     Most recent eGFR/CrCl: No results found for: EGFR  No components found for: CRCL  Current Barriers:   Knowledge deficit related to self care management of hypertension  Cognitive Deficits  Case Manager Clinical Goal(s):   Over the next 90 days, patient will attend all scheduled medical appointments:    Over the next 90 days, patient will demonstrate improved adherence to prescribed treatment plan for hypertension as evidenced by taking all medications as prescribed, monitoring and recording blood pressure as directed,  adhering to low sodium/DASH diet  Over the next 90 days, patient will demonstrate improved health management independence as evidenced by checking blood pressure as directed and notifying PCP if SBP>180 or DBP > 96, taking all medications as prescribe, and adhering to a low sodium diet as discussed.  Over the next 90 days, patient will verbalize basic understanding of hypertension disease process and self health management plan as evidenced by adhering to medication, keeping appointments and checking and documenting blood pressures by caregiver.  Interventions:   Evaluation of current treatment plan related to hypertension self management and patient's adherence to plan as established by provider.  Reviewed medications with patient and discussed importance of compliance  Discussed plans with patient for ongoing care management follow up and provided patient with direct contact information for care management team  Advised patient, providing education and rationale, to monitor blood pressure daily and record, calling PCP for findings outside established parameters.   Reviewed scheduled/upcoming provider appointments including:   Provided education regarding s/s of stroke and stroke prevention  Provided education regarding s/s of heart attack  Provided education regarding s/s of DASH diet/Low salt diet  Provided education regarding complications of uncontrolled blood pressure  Provided education regarding increasing physical activity  Provided educational material: Matter of Choice Blood Pressure Control  Provided educational material: Exercise Program Activity Book  Patient Self Care Activities:   Attends all scheduled provider appointments  Calls provider office for new concerns, questions, or BP outside discussed parameters  Monitors BP and records as discussed  Adheres to a low sodium diet/DASH diet  Increase physical activity as tolerated RN will follow up outreach  within the month of October      Prevent falls        Assessment:  Patient last three blood pressures 111/56 77. 122/77 hr 70 104/54 hr 69 Patient has been restarted on cholesterol medication Patient is receiving physical therapy twice a week Patient has not had any recent falls since last outreach Appetite is good Plan:  RN encouraged caregiver to continue to keep up the good work monitoring and documenting blood pressures RN discussed exercise routine between physical therapy RN  Sent Exercise program activity booklet RN reiterated medication adherence RN will follow up with patient and caregiver within the month of September RN sent PCP update assessment  [place] Management [phone]

## 2020-07-08 NOTE — Patient Outreach (Signed)
[place] [facility]) Care Management  07/08/2020  Casey Fisher 1928-03-11 [id number]   RN Health Coach attempted follow up outreach call to patient.  Patient was unavailable. HIPPA compliance voicemail message left with return callback number.  Plan: RN will call patient again within 30 days.  [place] Care Management [phone]

## 2020-07-11 DIAGNOSIS — F329 Major depressive disorder, single episode, unspecified: Secondary | ICD-10-CM | POA: Diagnosis not present

## 2020-07-11 DIAGNOSIS — G609 Hereditary and idiopathic neuropathy, unspecified: Secondary | ICD-10-CM | POA: Diagnosis not present

## 2020-07-11 DIAGNOSIS — I10 Essential (primary) hypertension: Secondary | ICD-10-CM | POA: Diagnosis not present

## 2020-07-11 DIAGNOSIS — G709 Myoneural disorder, unspecified: Secondary | ICD-10-CM | POA: Diagnosis not present

## 2020-07-11 DIAGNOSIS — F419 Anxiety disorder, unspecified: Secondary | ICD-10-CM | POA: Diagnosis not present

## 2020-07-11 DIAGNOSIS — I6782 Cerebral ischemia: Secondary | ICD-10-CM | POA: Diagnosis not present

## 2020-07-19 DIAGNOSIS — F419 Anxiety disorder, unspecified: Secondary | ICD-10-CM | POA: Diagnosis not present

## 2020-07-19 DIAGNOSIS — F329 Major depressive disorder, single episode, unspecified: Secondary | ICD-10-CM | POA: Diagnosis not present

## 2020-07-19 DIAGNOSIS — I10 Essential (primary) hypertension: Secondary | ICD-10-CM | POA: Diagnosis not present

## 2020-07-19 DIAGNOSIS — G709 Myoneural disorder, unspecified: Secondary | ICD-10-CM | POA: Diagnosis not present

## 2020-07-19 DIAGNOSIS — I6782 Cerebral ischemia: Secondary | ICD-10-CM | POA: Diagnosis not present

## 2020-07-19 DIAGNOSIS — G609 Hereditary and idiopathic neuropathy, unspecified: Secondary | ICD-10-CM | POA: Diagnosis not present

## 2020-07-24 ENCOUNTER — Telehealth: Payer: Self-pay | Admitting: Neurology

## 2020-07-24 DIAGNOSIS — G709 Myoneural disorder, unspecified: Secondary | ICD-10-CM | POA: Diagnosis not present

## 2020-07-24 DIAGNOSIS — G609 Hereditary and idiopathic neuropathy, unspecified: Secondary | ICD-10-CM | POA: Diagnosis not present

## 2020-07-24 DIAGNOSIS — F329 Major depressive disorder, single episode, unspecified: Secondary | ICD-10-CM | POA: Diagnosis not present

## 2020-07-24 DIAGNOSIS — I10 Essential (primary) hypertension: Secondary | ICD-10-CM | POA: Diagnosis not present

## 2020-07-24 DIAGNOSIS — F419 Anxiety disorder, unspecified: Secondary | ICD-10-CM | POA: Diagnosis not present

## 2020-07-24 DIAGNOSIS — I6782 Cerebral ischemia: Secondary | ICD-10-CM | POA: Diagnosis not present

## 2020-07-24 NOTE — Telephone Encounter (Signed)
Pt's daughter, Glena [place] called, would like to know the date physical therapy ends. Daughter would like nurse to call her back.

## 2020-07-24 NOTE — Telephone Encounter (Signed)
I called daughter of pt.  She is wanting to know how long PT will be for.  I told her that orders are placed but eval and treatment plan drawn up by PT HHA (using [facility]).  I told her to call [facility] they should be able to help in that regards, If pt discharged (because they are better or limits on pts insurance).  She will call.

## 2020-07-30 ENCOUNTER — Ambulatory Visit (INDEPENDENT_AMBULATORY_CARE_PROVIDER_SITE_OTHER): Payer: Medicare Other | Admitting: Orthopaedic Surgery

## 2020-07-30 ENCOUNTER — Encounter: Payer: Self-pay | Admitting: Orthopaedic Surgery

## 2020-07-30 ENCOUNTER — Other Ambulatory Visit: Payer: Self-pay

## 2020-07-30 VITALS — Ht 74.0 in | Wt 177.0 lb

## 2020-07-30 DIAGNOSIS — M1711 Unilateral primary osteoarthritis, right knee: Secondary | ICD-10-CM

## 2020-07-30 DIAGNOSIS — G609 Hereditary and idiopathic neuropathy, unspecified: Secondary | ICD-10-CM | POA: Diagnosis not present

## 2020-07-30 DIAGNOSIS — F329 Major depressive disorder, single episode, unspecified: Secondary | ICD-10-CM | POA: Diagnosis not present

## 2020-07-30 DIAGNOSIS — I10 Essential (primary) hypertension: Secondary | ICD-10-CM | POA: Diagnosis not present

## 2020-07-30 DIAGNOSIS — G709 Myoneural disorder, unspecified: Secondary | ICD-10-CM | POA: Diagnosis not present

## 2020-07-30 DIAGNOSIS — F419 Anxiety disorder, unspecified: Secondary | ICD-10-CM | POA: Diagnosis not present

## 2020-07-30 DIAGNOSIS — I6782 Cerebral ischemia: Secondary | ICD-10-CM | POA: Diagnosis not present

## 2020-07-30 MED ORDER — BUPIVACAINE HCL 0.5 % IJ SOLN
2.0000 mL | INTRAMUSCULAR | Status: AC | PRN
  Administered 2020-07-30: 2 mL via INTRA_ARTICULAR

## 2020-07-30 MED ORDER — METHYLPREDNISOLONE ACETATE 40 MG/ML IJ SUSP
80.0000 mg | INTRAMUSCULAR | Status: AC | PRN
  Administered 2020-07-30: 80 mg via INTRA_ARTICULAR

## 2020-07-30 MED ORDER — LIDOCAINE HCL 1 % IJ SOLN
2.0000 mL | INTRAMUSCULAR | Status: AC | PRN
  Administered 2020-07-30: 2 mL

## 2020-07-30 NOTE — Progress Notes (Signed)
Office Visit Note   Patient: Casey Fisher           Date of Birth: 04-25-1928           MRN: [id number] Visit Date: 07/30/2020              Requested by: [name], MD [address] [place],  [place] [address] PCP: [name], MD   Assessment & Plan: Visit Diagnoses:  1. Primary osteoarthritis of right knee     Plan: Mr. [place] is accompanied by her friend and here for follow-up evaluation of the osteoarthritis of his right knee.  He does have end-stage changes by prior films.  His last cortisone injection was the end of May that made a "big difference".  He has a number of comorbidities including dementia, peripheral neuropathy and possibly Parkinson's.  He is being followed by the neurologist.  He does live by himself.  He uses a walker for ambulation.  He has had a number of recent falls.  I do not see any fracture about the right knee.  The right knee was not hot warm or red and there was no effusion or evidence of a hemarthrosis.  He has a healing bruise about a centimeter or less over the tibial tubercle without any drainage or erythema.  I will reinject his knee and just monitor his response also apply a spider brace as it might provide some stability if his knee is playing any part in his falls  Follow-Up Instructions: Return if symptoms worsen or fail to improve.   Orders:  Orders Placed This Encounter  Procedures  . Large Joint Inj: R knee   No orders of the defined types were placed in this encounter.     Procedures: Large Joint Inj: R knee on 07/30/2020 2:34 PM Indications: pain and diagnostic evaluation Details: 25 G 1.5 in needle, anteromedial approach  Arthrogram: No  Medications: 2 mL lidocaine 1 %; 2 mL bupivacaine 0.5 %; 80 mg methylPREDNISolone acetate 40 MG/ML Procedure, treatment alternatives, risks and benefits explained, specific risks discussed. Consent was given by the patient. Immediately prior to procedure a time out was called to  verify the correct patient, procedure, equipment, support staff and site/side marked as required. Patient was prepped and draped in the usual sterile fashion.       Clinical Data: No additional findings.   Subjective: Chief Complaint  Patient presents with  . Right Knee - Follow-up  Patient presents today for his right knee. He is wanting to get another cortisone injection. He received his last cortisone injection on 05/23/2020. He states that it helped for about one month. He fell two weeks ago and landed on his knee. He states that his knee has been more swollen since his fall. He takes Gabapentin for his pain.   HPI  Review of Systems  Constitutional: Negative for fatigue.  HENT: Negative for ear pain.   Eyes: Negative for pain.  Respiratory: Negative for shortness of breath.   Cardiovascular: Negative for leg swelling.  Gastrointestinal: Negative for constipation and diarrhea.  Endocrine: Negative for cold intolerance and heat intolerance.  Genitourinary: Positive for difficulty urinating.  Musculoskeletal: Positive for joint swelling.  Skin: Positive for rash.  Allergic/Immunologic: Negative for food allergies.  Neurological: Negative for weakness.  Hematological: Bruises/bleeds easily.  Psychiatric/Behavioral: Negative for sleep disturbance.     Objective: Vital Signs: Ht 6\' 2"  (1.88 m)   Wt 177 lb (80.3 kg)   BMI 22.73  kg/m   Physical Exam Constitutional:      Appearance: He is well-developed.  Eyes:     Pupils: Pupils are equal, round, and reactive to light.  Pulmonary:     Effort: Pulmonary effort is normal.  Skin:    General: Skin is warm and dry.  Neurological:     Mental Status: He is alert.  Psychiatric:        Behavior: Behavior normal.     Ortho Exam quiet, no shortness of breath.  Answered simple questions appropriately.  Right knee lacked a few degrees to full extension.  Slight varus with palpable osteophytes along the medial aspect of his  knee.  No knee effusion or evidence of hemarthrosis.  Abrasion about a centimeter over the tibial tubercle with a scab.  No drainage or erythema.  No instability  Specialty Comments:  No specialty comments available.  Imaging: No results found.   PMFS History: Patient Active Problem List   Diagnosis Date Noted  . Primary osteoarthritis of right knee 05/23/2020  . Low back pain 08/03/2019  . Acute pain of left shoulder 10/10/2018  . Chest pain 11/08/2017  . Memory difficulty 03/24/2016  . Hereditary and idiopathic peripheral neuropathy 03/24/2016  . Abnormality of gait 03/24/2016  . Peripheral neuropathy 04/18/2015  . Hyperlipidemia 04/18/2015  . Prostate cancer ([place]) 08/24/2014  . Right inguinal hernia 10/30/2013   Past Medical History:  Diagnosis Date  . Abnormality of gait 03/24/2016  . Anxiety   . Cancer ([place])   . Cataract   . Depression   . Heart murmur   . Hereditary and idiopathic peripheral neuropathy 03/24/2016  . Hypertension   . Memory difficulty 03/24/2016  . Neuromuscular disorder ([place])   . Neuropathy   . Prostate cancer ([place])    remission    Family History  Problem Relation Age of Onset  . Brain cancer Daughter   . Heart disease Mother   . Stroke Mother   . Heart disease Father     Past Surgical History:  Procedure Laterality Date  . CATARACT EXTRACTION Bilateral   . EYE SURGERY    . HERNIA REPAIR  1985  . INGUINAL HERNIA REPAIR Right 12/06/2013   Procedure: HERNIA REPAIR INGUINAL ADULT;  Surgeon: Merrie Roof, MD;  Location: [facility];  Service: General;  Laterality: Right;  . INSERTION OF MESH Right 12/06/2013   Procedure: INSERTION OF MESH;  Surgeon: Merrie Roof, MD;  Location: [place];  Service: General;  Laterality: Right;  . PROSTATE SURGERY     Social History   Occupational History  . Not on file  Tobacco Use  . Smoking status: Former Research scientist (life sciences)  . Smokeless tobacco: Never Used  Vaping Use  . Vaping Use: Never used  Substance and Sexual  Activity  . Alcohol use: No  . Drug use: No  . Sexual activity: Not on file

## 2020-09-10 ENCOUNTER — Other Ambulatory Visit: Payer: Self-pay | Admitting: *Deleted

## 2020-09-10 NOTE — Patient Outreach (Signed)
[place] [facility]) Care Management  [place]  09/10/2020   Casey Fisher [birth year] [id number]  [place] telephone call to patient caregiver [name].  Hipaa compliance verified. Per caregiver appetite is good. Caregiver is monitoring blood pressure daily. Patient is exercising twice a day. Patient had a recent fall. No apparent injuries. Patient is using walker when ambulating. Patient caregiver plays chess with him to keep mind active. Patient and caregiver has agreed to follow up outreach.  Encounter Medications:  Outpatient Encounter Medications as of 09/10/2020  Medication Sig  . ammonium lactate (AMLACTIN) 12 % lotion Apply to both feet twice daily for dry skin  . aspirin EC 81 MG tablet Take 81 mg by mouth daily. Reported on 05/14/2016  . Calcium Carb-Cholecalciferol (CALCIUM 600 + D PO) Take 1 tablet by mouth daily.  . carbidopa-levodopa (SINEMET IR) 25-100 MG tablet TAKE 1/2 TABLET BY MOUTH 3 TIMES DAILY FOR 4 WEEKS, THEN 1 TABLET 3 TIMES DAILY  . donepezil (ARICEPT) 5 MG tablet Take 1 tablet (5 mg total) by mouth at bedtime.  . DULoxetine (CYMBALTA) 30 MG capsule Take 1 capsule (30 mg total) by mouth 2 (two) times daily.  . ERLEADA 60 MG tablet Take 120 mg by mouth daily.   Marland Kitchen gabapentin (NEURONTIN) 600 MG tablet TAKE 2 TABLETS BY MOUTH IN THE MORNING AND TAKE 2 TABLETS BY MOUTH AT MIDDAY AND TAKE 1 TABLET IN THE EVENING  . hydrochlorothiazide (HYDRODIURIL) 12.5 MG tablet TAKE 1 TABLET(12.5 MG) BY MOUTH DAILY  . Leuprolide Acetate (LUPRON IJ) Inject 1 application as directed every 6 (six) months.  . simvastatin (ZOCOR) 40 MG tablet TAKE 1 TABLET(40 MG) BY MOUTH DAILY  . tamsulosin (FLOMAX) 0.4 MG CAPS capsule Take 0.4 mg daily by mouth. Reported on 05/14/2016   No facility-administered encounter medications on file as of 09/10/2020.    Functional Status:  In your present state of health, do you have any difficulty performing the following activities:  04/02/2020 10/31/2019  Hearing? Y Y  Comment need hearing aide patient had hearing test and looking at getting hearing aid  Vision? N N  Difficulty concentrating or making decisions? N N  Walking or climbing stairs? Tempie Donning  Comment uses cane or walker uses cane or walker  Dressing or bathing? N N  Doing errands, shopping? Tempie Donning  Comment caregiver takes patient friends take patient to do shopping  Preparing Food and eating ? Y Y  Comment patient has caregivers that prepares his food Friends bring meals and meals on wheels  Using the Toilet? - N  In the past six months, have you accidently leaked urine? Y Y  Do you have problems with loss of bowel control? N N  Managing your Medications? N N  Managing your Finances? N N  Housekeeping or managing your Housekeeping? [place] givers -  Some recent data might be hidden    Fall/Depression Screening: Fall Risk  09/10/2020 07/08/2020 04/02/2020  Falls in the past year? _0 Comment - - -  Number falls in past yr: _1 Comment - - -  Injury with Fall? 0 0 0  Risk Factor Category  - - -  Risk for fall due to : History of fall(s);Impaired balance/gait;Impaired mobility History of fall(s);Impaired balance/gait;Impaired mobility History of fall(s);Impaired balance/gait;Impaired mobility  Follow up Falls evaluation completed Falls evaluation completed;Falls prevention discussed Falls evaluation completed;Education provided   [facility] 2/9 Scores 01/25/2020 01/19/2020 12/06/2019  10/31/2019 10/05/2019 08/31/2019 08/07/2019  PHQ - 2 Score 0 0 0 0 0 0 0   Goals Addressed            This Visit's Progress   . Blood Pressure < 140/90       CARE PLAN ENTRY (see longtitudinal plan of care for additional care plan information)  Objective:  . Last practice recorded BP readings:  BP Readings from Last 3 Encounters:  06/04/20 135/65  02/02/20 (!) 145/73  01/25/20 132/66 .   Marland Kitchen Most recent eGFR/CrCl: No results found for: EGFR  No components found for:  CRCL  Current Barriers:  Marland Kitchen Knowledge deficit related to self care management of hypertension . Cognitive Deficits  Case Manager Clinical Goal(s):  Marland Kitchen Over the next 90 days, patient will attend all scheduled medical appointments:   . Over the next 90 days, patient will demonstrate improved adherence to prescribed treatment plan for hypertension as evidenced by taking all medications as prescribed, monitoring and recording blood pressure as directed, adhering to low sodium/DASH diet . Over the next 90 days, patient will demonstrate improved health management independence as evidenced by checking blood pressure as directed and notifying PCP if SBP>180 or DBP > 96, taking all medications as prescribe, and adhering to a low sodium diet as discussed. . Over the next 90 days, patient will verbalize basic understanding of hypertension disease process and self health management plan as evidenced by adhering to medication, keeping appointments and checking and documenting blood pressures by caregiver.  Interventions:  . Evaluation of current treatment plan related to hypertension self management and patient's adherence to plan as established by provider. . Reviewed medications with patient and discussed importance of compliance . Discussed plans with patient for ongoing care management follow up and provided patient with direct contact information for care management team . Advised patient, providing education and rationale, to monitor blood pressure daily and record, calling PCP for findings outside established parameters.  . Reviewed scheduled/upcoming provider appointments including:  . Provided education regarding s/s of stroke and stroke prevention . Provided education regarding s/s of heart attack . Provided education regarding s/s of DASH diet/Low salt diet . Provided education regarding complications of uncontrolled blood pressure . Provided education regarding increasing physical  activity . Provided educational material: Matter of Choice Blood Pressure Control . Provided educational material: Exercise Program Activity Book  Patient Self Care Activities:  . Attends all scheduled provider appointments . Calls provider office for new concerns, questions, or BP outside discussed parameters . Monitors BP and records as discussed . Adheres to a low sodium diet/DASH diet . Increase physical activity as tolerated  Patient recent blood pressures are on track 126/70,120/64,129/70,137/71,123/64 RN will follow up outreach within the month of December     . Keep TV off during meals          Assessment:  Appetite good Patient is adhering to low sodium diet Patient has a routine exercise Patient plays checkers to help keep brain stimulated Patient had recent fall with no injuries Plan:  Discussed fall prevention Discussed healthy eating Discussed follow up COVID booster and Flu shot Caregiver will continue to monitor and document BP RN will follow up outreach within the month of December RN sent update assessment to PCP  [place] Management [phone]

## 2020-09-10 NOTE — Patient Outreach (Signed)
[place] [facility]) Care Management  09/10/2020  Casey Fisher 10-May-1928 [id number]   RN Health Coach telephone call to patient caregiver Casey Fisher. He did not have the blood pressure readings with him. [name] will call me back once he gets to patient home.  [place] Care Management [phone]

## 2020-09-16 DIAGNOSIS — C61 Malignant neoplasm of prostate: Secondary | ICD-10-CM | POA: Diagnosis not present

## 2020-09-17 ENCOUNTER — Ambulatory Visit (INDEPENDENT_AMBULATORY_CARE_PROVIDER_SITE_OTHER): Payer: Medicare Other | Admitting: Podiatry

## 2020-09-17 ENCOUNTER — Encounter: Payer: Self-pay | Admitting: Podiatry

## 2020-09-17 ENCOUNTER — Other Ambulatory Visit: Payer: Self-pay

## 2020-09-17 DIAGNOSIS — M79674 Pain in right toe(s): Secondary | ICD-10-CM | POA: Diagnosis not present

## 2020-09-17 DIAGNOSIS — L84 Corns and callosities: Secondary | ICD-10-CM

## 2020-09-17 DIAGNOSIS — B351 Tinea unguium: Secondary | ICD-10-CM

## 2020-09-17 DIAGNOSIS — I739 Peripheral vascular disease, unspecified: Secondary | ICD-10-CM | POA: Diagnosis not present

## 2020-09-17 DIAGNOSIS — M79675 Pain in left toe(s): Secondary | ICD-10-CM | POA: Diagnosis not present

## 2020-09-17 DIAGNOSIS — G629 Polyneuropathy, unspecified: Secondary | ICD-10-CM

## 2020-09-22 ENCOUNTER — Encounter: Payer: Self-pay | Admitting: Podiatry

## 2020-09-22 NOTE — Progress Notes (Signed)
Subjective:  Patient ID: Casey Fisher, male    DOB: 1928/08/08,  MRN: [id number]  Casey Fisher presents to clinic today for for at risk foot care. Patient has h/o PAD and painful thick toenails that are difficult to trim. Pain interferes with ambulation. Aggravating factors include wearing enclosed shoe gear. Pain is relieved with periodic professional debridement..  86 y.o. male presents with the above complaint. He is accompanied by a caregiver on today's visit.  Review of Systems: Negative except as noted in the HPI. Past Medical History:  Diagnosis Date  . Abnormality of gait 03/24/2016  . Anxiety   . Cancer ([place])   . Cataract   . Depression   . Heart murmur   . Hereditary and idiopathic peripheral neuropathy 03/24/2016  . Hypertension   . Memory difficulty 03/24/2016  . Neuromuscular disorder ([place])   . Neuropathy   . Prostate cancer ([place])    remission   Past Surgical History:  Procedure Laterality Date  . CATARACT EXTRACTION Bilateral   . EYE SURGERY    . HERNIA REPAIR  1985  . INGUINAL HERNIA REPAIR Right 12/06/2013   Procedure: HERNIA REPAIR INGUINAL ADULT;  Surgeon: Merrie Roof, MD;  Location: [facility];  Service: General;  Laterality: Right;  . INSERTION OF MESH Right 12/06/2013   Procedure: INSERTION OF MESH;  Surgeon: Merrie Roof, MD;  Location: [facility];  Service: General;  Laterality: Right;  . PROSTATE SURGERY      Current Outpatient Medications:  .  ammonium lactate (AMLACTIN) 12 % lotion, Apply to both feet twice daily for dry skin, Disp: 396 g, Rfl: 4 .  aspirin EC 81 MG tablet, Take 81 mg by mouth daily. Reported on 05/14/2016, Disp: , Rfl:  .  Calcium Carb-Cholecalciferol (CALCIUM 600 + D PO), Take 1 tablet by mouth daily., Disp: , Rfl:  .  carbidopa-levodopa (SINEMET IR) 25-100 MG tablet, TAKE 1/2 TABLET BY MOUTH 3 TIMES DAILY FOR 4 WEEKS, THEN 1 TABLET 3 TIMES DAILY, Disp: 90 tablet, Rfl: 5 .  donepezil (ARICEPT) 5 MG tablet, Take 1 tablet (5 mg  total) by mouth at bedtime., Disp: 90 tablet, Rfl: 1 .  DULoxetine (CYMBALTA) 30 MG capsule, Take 1 capsule (30 mg total) by mouth 2 (two) times daily., Disp: 180 capsule, Rfl: 1 .  ERLEADA 60 MG tablet, Take 120 mg by mouth daily. , Disp: , Rfl:  .  gabapentin (NEURONTIN) 600 MG tablet, TAKE 2 TABLETS BY MOUTH IN THE MORNING AND TAKE 2 TABLETS BY MOUTH AT MIDDAY AND TAKE 1 TABLET IN THE EVENING, Disp: 450 tablet, Rfl: 1 .  hydrochlorothiazide (HYDRODIURIL) 12.5 MG tablet, TAKE 1 TABLET(12.5 MG) BY MOUTH DAILY, Disp: 90 tablet, Rfl: 1 .  Leuprolide Acetate (LUPRON IJ), Inject 1 application as directed every 6 (six) months., Disp: , Rfl:  .  simvastatin (ZOCOR) 40 MG tablet, TAKE 1 TABLET(40 MG) BY MOUTH DAILY, Disp: 90 tablet, Rfl: 2 .  tamsulosin (FLOMAX) 0.4 MG CAPS capsule, Take 0.4 mg daily by mouth. Reported on 05/14/2016, Disp: , Rfl: 1 Allergies  Allergen Reactions  . Lyrica [Pregabalin] Other (See Comments)    "constipation"   Social History   Occupational History  . Not on file  Tobacco Use  . Smoking status: Former Research scientist (life sciences)  . Smokeless tobacco: Never Used  Vaping Use  . Vaping Use: Never used  Substance and Sexual Activity  . Alcohol use: No  . Drug use: No  . Sexual activity:  Not on file    Objective:   Constitutional Casey Fisher is a pleasant 86 y.o. African American male, WD, WN in NAD.Marland Kitchen AAO x 3.   Vascular Capillary refill time to digits immediate b/l. Palpable DP pulse(s) b/l lower extremities Nonpalpable PT pulse(s) b/l lower extremities. Pedal hair absent. Lower extremity skin temperature gradient within normal limits.  No cyanosis or clubbing noted.  Neurologic Normal speech. Oriented to person, place, and time. Epicritic sensation to light touch grossly present bilaterally. Protective sensation diminished with 10g monofilament b/l.  Dermatologic Pedal skin with normal turgor, texture and tone bilaterally. No open wounds bilaterally. No interdigital  macerations bilaterally. Toenails 1-5 b/l elongated, discolored, dystrophic, thickened, crumbly with subungual debris and tenderness to dorsal palpation. Hyperkeratotic lesion(s) submet head 1 left foot.  No erythema, no edema, no drainage, no flocculence.  Orthopedic: Normal muscle strength 5/5 to all lower extremity muscle groups bilaterally. No pain crepitus or joint limitation noted with ROM b/l. Hallux valgus with bunion deformity noted b/l lower extremities. Hammertoes noted to the b/l lower extremities. Utilizes wheelchair for mobility assistance.   Radiographs: None Assessment:   1. Pain due to onychomycosis of toenails of both feet   2. Callus   3. Neuropathy   4. PAD (peripheral artery disease) ([place])    Plan:  Patient was evaluated and treated and all questions answered.  Onychomycosis with pain -Nails palliatively debridement as below -Educated on self-care  Procedure: Nail Debridement Rationale: Pain Type of Debridement: manual, sharp debridement. Instrumentation: Nail nipper, rotary burr. Number of Nails: 10 -Examined patient. -No new findings. No new orders. -Toenails 1-5 b/l were debrided in length and girth with sterile nail nippers and dremel without iatrogenic bleeding.  -Callus(es) submet head 1 left foot pared utilizing sterile scalpel blade without complication or incident. Total number debrided =1. -Patient to report any pedal injuries to medical professional immediately. -Patient to continue soft, supportive shoe gear daily. -Patient/POA to call should there be question/concern in the interim.  Return in about 3 months (around 12/17/2020).  Marzetta Board, DPM

## 2020-09-23 DIAGNOSIS — C61 Malignant neoplasm of prostate: Secondary | ICD-10-CM | POA: Diagnosis not present

## 2020-09-23 DIAGNOSIS — N35012 Post-traumatic membranous urethral stricture: Secondary | ICD-10-CM | POA: Diagnosis not present

## 2020-09-30 ENCOUNTER — Ambulatory Visit: Payer: Medicare Other | Attending: Internal Medicine

## 2020-09-30 DIAGNOSIS — Z23 Encounter for immunization: Secondary | ICD-10-CM

## 2020-09-30 NOTE — Progress Notes (Signed)
   Covid-19 Vaccination Clinic  Name:  Casey Fisher    MRN: [id number] DOB: 1928-03-23  09/30/2020  Mr. Casey Fisher was observed post Covid-19 immunization for 15 minutes without incident. He was provided with Vaccine Information Sheet and instruction to access the V-Safe system.   Mr. Casey Fisher was instructed to call 911 with any severe reactions post vaccine: Marland Kitchen Difficulty breathing  . Swelling of face and throat  . A fast heartbeat  . A bad rash all over body  . Dizziness and weakness

## 2020-10-09 ENCOUNTER — Telehealth: Payer: Self-pay | Admitting: Neurology

## 2020-10-09 MED ORDER — DONEPEZIL HCL 5 MG PO TABS
5.0000 mg | ORAL_TABLET | Freq: Every day | ORAL | 1 refills | Status: DC
Start: 2020-10-09 — End: 2021-03-12

## 2020-10-09 NOTE — Telephone Encounter (Signed)
Pt request refill donepezil (ARICEPT) 5 MG tablet at [facility]

## 2020-10-29 ENCOUNTER — Other Ambulatory Visit: Payer: Self-pay | Admitting: Family Medicine

## 2020-10-29 DIAGNOSIS — G629 Polyneuropathy, unspecified: Secondary | ICD-10-CM

## 2020-10-29 NOTE — Telephone Encounter (Signed)
Requested Prescriptions  Pending Prescriptions Disp Refills   gabapentin (NEURONTIN) 600 MG tablet [Pharmacy Med Name: GABAPENTIN 600MG  TABLETS] 450 tablet 0    Sig: TAKE 2 TABLETS BY MOUTH IN THE MORNING AND TAKE 2 TABLETS BY MOUTH AT MIDDAY AND TAKE 1 TABLET IN THE EVENING     Neurology: Anticonvulsants - gabapentin Passed - 10/29/2020  7:02 PM      Passed - Valid encounter within last 12 months    Recent Outpatient Visits          9 months ago Fall at home, subsequent encounter   [facility], Ranell Patrick, MD   9 months ago Essential hypertension   [facility], Ranell Patrick, MD   10 months ago Essential hypertension   [facility], Ranell Patrick, MD   10 months ago Choking, initial encounter   [facility], Ranell Patrick, MD   1 year ago Physical deconditioning   [facility], Ranell Patrick, MD

## 2020-11-05 DIAGNOSIS — R3914 Feeling of incomplete bladder emptying: Secondary | ICD-10-CM | POA: Diagnosis not present

## 2020-11-05 DIAGNOSIS — N35011 Post-traumatic bulbous urethral stricture: Secondary | ICD-10-CM | POA: Diagnosis not present

## 2020-11-05 DIAGNOSIS — R310 Gross hematuria: Secondary | ICD-10-CM | POA: Diagnosis not present

## 2020-11-05 DIAGNOSIS — C61 Malignant neoplasm of prostate: Secondary | ICD-10-CM | POA: Diagnosis not present

## 2020-11-07 ENCOUNTER — Other Ambulatory Visit: Payer: Self-pay | Admitting: Family Medicine

## 2020-11-07 DIAGNOSIS — I10 Essential (primary) hypertension: Secondary | ICD-10-CM

## 2020-11-07 NOTE — Telephone Encounter (Signed)
Requested medication (s) are due for refill today: yes   Requested medication (s) are on the active medication list: yes   Last refill:  05/23/20 #90 1 refill  Future visit scheduled: no  Notes to clinic:  no valid encounter in 6 months, patient needs appt , do you want courtesy refill?     Requested Prescriptions  Pending Prescriptions Disp Refills   hydrochlorothiazide (HYDRODIURIL) 12.5 MG tablet [Pharmacy Med Name: HYDROCHLOROTHIAZIDE 12.5MG  TABLETS] 90 tablet 1    Sig: TAKE 1 TABLET(12.5 MG) BY MOUTH DAILY      Cardiovascular: Diuretics - Thiazide Failed - 11/07/2020  5:59 PM      Failed - Na in normal range and within 360 days    Sodium  Date Value Ref Range Status  12/06/2019 145 (H) 134 - 144 mmol/L Final          Failed - Valid encounter within last 6 months    Recent Outpatient Visits           9 months ago Fall at home, subsequent encounter   [facility], Ranell Patrick, MD   9 months ago Essential hypertension   [facility], Ranell Patrick, MD   11 months ago Essential hypertension   [facility], Ranell Patrick, MD   11 months ago Choking, initial encounter   [facility], Ranell Patrick, MD   1 year ago Physical deconditioning   [facility], Ranell Patrick, MD              Passed - Ca in normal range and within 360 days    Calcium  Date Value Ref Range Status  12/06/2019 9.0 8.6 - 10.2 mg/dL Final   Calcium, Ion  Date Value Ref Range Status  05/29/2012 1.20 1.12 - 1.32 mmol/L Final          Passed - Cr in normal range and within 360 days    Creat  Date Value Ref Range Status  03/11/2016 0.95 0.70 - 1.11 mg/dL Final   Creatinine, Ser  Date Value Ref Range Status  12/06/2019 1.03 0.76 - 1.27 mg/dL Final          Passed - K in normal range and within 360 days    Potassium  Date Value Ref Range Status  12/06/2019 3.7 3.5 - 5.2 mmol/L Final          Passed - Last BP in  normal range    BP Readings from Last 1 Encounters:  06/04/20 135/65

## 2020-11-08 ENCOUNTER — Telehealth: Payer: Self-pay | Admitting: Family Medicine

## 2020-11-08 NOTE — Telephone Encounter (Signed)
Called pt he stated he will have one of his emergency contacts contact our office back to schedule med refill appt because they are the ones that take him to his appt. Please advise.

## 2020-11-08 NOTE — Telephone Encounter (Signed)
Noted. THANKS.

## 2020-11-08 NOTE — Telephone Encounter (Signed)
I have attempted to call pt and there was no answer. Left message to call back and please attempt to call pt and schedule appt since he has not been seen since Jan 2021.  Thanks  [place] Refill sent

## 2020-11-08 NOTE — Telephone Encounter (Signed)
appt scheduled 1st available Monday the 11/11/2020  at 8:40 for med refills

## 2020-11-11 ENCOUNTER — Ambulatory Visit (INDEPENDENT_AMBULATORY_CARE_PROVIDER_SITE_OTHER): Payer: Medicare Other | Admitting: Family Medicine

## 2020-11-11 ENCOUNTER — Other Ambulatory Visit: Payer: Self-pay

## 2020-11-11 ENCOUNTER — Encounter: Payer: Self-pay | Admitting: Family Medicine

## 2020-11-11 VITALS — BP 139/72 | HR 74 | Temp 98.7°F | Ht 74.0 in | Wt 172.0 lb

## 2020-11-11 DIAGNOSIS — Z23 Encounter for immunization: Secondary | ICD-10-CM | POA: Diagnosis not present

## 2020-11-11 DIAGNOSIS — I10 Essential (primary) hypertension: Secondary | ICD-10-CM

## 2020-11-11 DIAGNOSIS — G629 Polyneuropathy, unspecified: Secondary | ICD-10-CM | POA: Diagnosis not present

## 2020-11-11 DIAGNOSIS — E785 Hyperlipidemia, unspecified: Secondary | ICD-10-CM

## 2020-11-11 LAB — COMPREHENSIVE METABOLIC PANEL
ALT: 8 IU/L (ref 0–44)
AST: 11 IU/L (ref 0–40)
Albumin/Globulin Ratio: 1.2 (ref 1.2–2.2)
Albumin: 3.9 g/dL (ref 3.5–4.6)
Alkaline Phosphatase: 85 IU/L (ref 44–121)
BUN/Creatinine Ratio: 13 (ref 10–24)
BUN: 15 mg/dL (ref 10–36)
Bilirubin Total: 0.3 mg/dL (ref 0.0–1.2)
CO2: 26 mmol/L (ref 20–29)
Calcium: 9.2 mg/dL (ref 8.6–10.2)
Chloride: 101 mmol/L (ref 96–106)
Creatinine, Ser: 1.14 mg/dL (ref 0.76–1.27)
GFR calc Af Amer: 65 mL/min/{1.73_m2} (ref >59)
GFR calc non Af Amer: 56 mL/min/{1.73_m2} — ABNORMAL LOW (ref >59)
Globulin, Total: 3.2 g/dL (ref 1.5–4.5)
Glucose: 93 mg/dL (ref 65–99)
Potassium: 4.1 mmol/L (ref 3.5–5.2)
Sodium: 140 mmol/L (ref 134–144)
Total Protein: 7.1 g/dL (ref 6.0–8.5)

## 2020-11-11 LAB — LIPID PANEL
Chol/HDL Ratio: 2.9 ratio (ref 0.0–5.0)
Cholesterol, Total: 176 mg/dL (ref 100–199)
HDL: 61 mg/dL (ref >39)
LDL Chol Calc (NIH): 104 mg/dL — ABNORMAL HIGH (ref 0–99)
Triglycerides: 54 mg/dL (ref 0–149)
VLDL Cholesterol Cal: 11 mg/dL (ref 5–40)

## 2020-11-11 MED ORDER — GABAPENTIN 600 MG PO TABS: ORAL_TABLET | ORAL | 1 refills | Status: DC

## 2020-11-11 MED ORDER — HYDROCHLOROTHIAZIDE 12.5 MG PO TABS: ORAL_TABLET | ORAL | 1 refills | Status: DC

## 2020-11-11 NOTE — Progress Notes (Signed)
Subjective:  Patient ID: Casey Fisher, male    DOB: Sep 21, 1928  Age: 86 y.o. MRN: [id number]  CC:  Chief Complaint  Patient presents with  . Medication Refill    on Simvastatin. PT reports this medication seems to work well with no side effects.    HPI Casey Fisher presents for   Hyperlipidemia: Simvastatin 40 mg daily.  No new side effects.  Lab Results  Component Value Date   CHOL 203 (H) 12/06/2019   HDL 60 12/06/2019   LDLCALC 131 (H) 12/06/2019   TRIG 69 12/06/2019   CHOLHDL 3.4 12/06/2019   Lab Results  Component Value Date   ALT 7 12/06/2019   AST 16 12/06/2019   ALKPHOS 88 12/06/2019   BILITOT 0.3 12/06/2019    Hypertension: Hydrochlorothiazide 12.5 mg daily.   Home readings: stable. Here with friend. BP Readings from Last 3 Encounters:  11/11/20 139/72  06/04/20 135/65  02/02/20 (!) 145/73   Lab Results  Component Value Date   CREATININE 1.03 12/06/2019   Peripheral neuropathy Treated with gabapentin 2 of the 600 mg in the morning, 2 in the afternoon, 1 in the evening. Still on same dose.  Still with burning on feet. Day or night. Less at night - sleeping ok.   Seen by urology for blood in urine - on keflex - improved.  History Patient Active Problem List   Diagnosis Date Noted  . Primary osteoarthritis of right knee 05/23/2020  . Low back pain 08/03/2019  . Acute pain of left shoulder 10/10/2018  . Chest pain 11/08/2017  . Memory difficulty 03/24/2016  . Hereditary and idiopathic peripheral neuropathy 03/24/2016  . Abnormality of gait 03/24/2016  . Peripheral neuropathy 04/18/2015  . Hyperlipidemia 04/18/2015  . Prostate cancer ([place]) 08/24/2014  . Right inguinal hernia 10/30/2013   Past Medical History:  Diagnosis Date  . Abnormality of gait 03/24/2016  . Anxiety   . Cancer ([place])   . Cataract   . Depression   . Heart murmur   . Hereditary and idiopathic peripheral neuropathy 03/24/2016  . Hypertension   . Memory  difficulty 03/24/2016  . Neuromuscular disorder ([place])   . Neuropathy   . Prostate cancer ([place])    remission   Past Surgical History:  Procedure Laterality Date  . CATARACT EXTRACTION Bilateral   . EYE SURGERY    . HERNIA REPAIR  1985  . INGUINAL HERNIA REPAIR Right 12/06/2013   Procedure: HERNIA REPAIR INGUINAL ADULT;  Surgeon: Merrie Roof, MD;  Location: [facility];  Service: General;  Laterality: Right;  . INSERTION OF MESH Right 12/06/2013   Procedure: INSERTION OF MESH;  Surgeon: Merrie Roof, MD;  Location: [facility];  Service: General;  Laterality: Right;  . PROSTATE SURGERY     Allergies  Allergen Reactions  . Lyrica [Pregabalin] Other (See Comments)    "constipation"   Prior to Admission medications   Medication Sig Start Date End Date Taking? Authorizing Provider  ammonium lactate (AMLACTIN) 12 % lotion Apply to both feet twice daily for dry skin 05/25/19  Yes [place], Stephani Police, DPM  aspirin EC 81 MG tablet Take 81 mg by mouth daily. Reported on 05/14/2016   Yes [provider]  Calcium Carb-Cholecalciferol (CALCIUM 600 + D PO) Take 1 tablet by mouth daily.   Yes [provider]  carbidopa-levodopa (SINEMET IR) 25-100 MG tablet TAKE 1/2 TABLET BY MOUTH 3 TIMES DAILY FOR 4 WEEKS, THEN 1 TABLET 3 TIMES  DAILY 06/04/20  Yes Suzzanne Cloud, NP  cephALEXin (KEFLEX) 500 MG capsule Take 500 mg by mouth 2 (two) times daily. 11/08/20  Yes [provider]  donepezil (ARICEPT) 5 MG tablet Take 1 tablet (5 mg total) by mouth at bedtime. 10/09/20  Yes Suzzanne Cloud, NP  DULoxetine (CYMBALTA) 30 MG capsule Take 1 capsule (30 mg total) by mouth 2 (two) times daily. 05/22/20  Yes Suzzanne Cloud, NP  ERLEADA 60 MG tablet Take 120 mg by mouth daily.  06/06/18  Yes [provider]  gabapentin (NEURONTIN) 600 MG tablet TAKE 2 TABLETS BY MOUTH IN THE MORNING AND TAKE 2 TABLETS BY MOUTH AT MIDDAY AND TAKE 1 TABLET IN THE EVENING 10/29/20  Yes [name], MD    hydrochlorothiazide (HYDRODIURIL) 12.5 MG tablet TAKE 1 TABLET(12.5 MG) BY MOUTH DAILY 11/08/20  Yes [name], MD  Leuprolide Acetate (LUPRON IJ) Inject 1 application as directed every 6 (six) months.   Yes [provider]  simvastatin (ZOCOR) 40 MG tablet TAKE 1 TABLET(40 MG) BY MOUTH DAILY 06/22/20  Yes [name], MD  tamsulosin (FLOMAX) 0.4 MG CAPS capsule Take 0.4 mg daily by mouth. Reported on 05/14/2016 08/17/15  Yes [provider]   Social History   Socioeconomic History  . Marital status: Widowed    Spouse name: Not on file  . Number of children: Not on file  . Years of education: Not on file  . Highest education level: Not on file  Occupational History  . Not on file  Tobacco Use  . Smoking status: Former Research scientist (life sciences)  . Smokeless tobacco: Never Used  Vaping Use  . Vaping Use: Never used  Substance and Sexual Activity  . Alcohol use: No  . Drug use: No  . Sexual activity: Not on file  Other Topics Concern  . Not on file  Social History Narrative   Lives home alone.  Retired.  Widowed.  % children.     Social Determinants of Health   Financial Resource Strain:   . Difficulty of Paying Living Expenses: Not on file  Food Insecurity: No Food Insecurity  . Worried About Charity fundraiser in the Last Year: Never true  . Ran Out of Food in the Last Year: Never true  Transportation Needs: No Transportation Needs  . Lack of Transportation (Medical): No  . Lack of Transportation (Non-Medical): No  Physical Activity:   . Days of Exercise per Week: Not on file  . Minutes of Exercise per Session: Not on file  Stress:   . Feeling of Stress : Not on file  Social Connections:   . Frequency of Communication with Friends and Family: Not on file  . Frequency of Social Gatherings with Friends and Family: Not on file  . Attends Religious Services: Not on file  . Active Member of Clubs or Organizations: Not on file  . Attends Archivist  Meetings: Not on file  . Marital Status: Not on file  Intimate Partner Violence:   . Fear of Current or Ex-Partner: Not on file  . Emotionally Abused: Not on file  . Physically Abused: Not on file  . Sexually Abused: Not on file    Review of Systems   Objective:   Vitals:   11/11/20 0855  BP: 139/72  Pulse: 74  Temp: 98.7 F (37.1 C)  TempSrc: Temporal  SpO2: 95%  Weight: 172 lb (78 kg)  Height: 6\' 2"  (1.88 m)  Physical Exam Vitals reviewed.  Constitutional:      Appearance: He is well-developed.  HENT:     Head: Normocephalic and atraumatic.     Right Ear: Tympanic membrane, ear canal and external ear normal.     Left Ear: Tympanic membrane, ear canal and external ear normal.     Nose: No rhinorrhea.     Mouth/Throat:     Pharynx: No oropharyngeal exudate or posterior oropharyngeal erythema.  Eyes:     Conjunctiva/sclera: Conjunctivae normal.     Pupils: Pupils are equal, round, and reactive to light.  Cardiovascular:     Rate and Rhythm: Normal rate and regular rhythm.     Heart sounds: Normal heart sounds. No murmur heard.   Pulmonary:     Effort: Pulmonary effort is normal.     Breath sounds: Normal breath sounds. No wheezing, rhonchi or rales.  Abdominal:     Palpations: Abdomen is soft.     Tenderness: There is no abdominal tenderness.  Musculoskeletal:     Cervical back: Neck supple.  Lymphadenopathy:     Cervical: No cervical adenopathy.  Skin:    General: Skin is warm and dry.     Findings: No rash.  Neurological:     Mental Status: He is alert and oriented to person, place, and time.  Psychiatric:        Mood and Affect: Mood normal.        Behavior: Behavior normal.        Assessment & Plan:  Casey Fisher is a 86 y.o. male . Essential hypertension - Plan: hydrochlorothiazide (HYDRODIURIL) 12.5 MG tablet, Lipid panel, Comprehensive metabolic panel  - stable - continue same regimen.   Peripheral polyneuropathy - Plan:  gabapentin (NEURONTIN) 600 MG tablet  - persistent symptoms. Potential option of higher dosing, but can discuss with neuro as well. Refilled at same dose for now.   Need for vaccination - Plan: Flu Vaccine QUAD High Dose(Fluad)  Hyperlipidemia, unspecified hyperlipidemia type  - tolerating simvastatin. Continue same.   No orders of the defined types were placed in this encounter.  Patient Instructions       If you have lab work done today you will be contacted with your lab results within the next 2 weeks.  If you have not heard from [place] then please contact us. The fastest way to get your results is to register for My Chart.   IF you received an x-ray today, you will receive an invoice from [facility]. Please contact [facility] at [phone] with questions or concerns regarding your invoice.   IF you received labwork today, you will receive an invoice from [facility]. Please contact [facility] at [phone] with questions or concerns regarding your invoice.   Our billing staff will not be able to assist you with questions regarding bills from these companies.  You will be contacted with the lab results as soon as they are available. The fastest way to get your results is to activate your My Chart account. Instructions are located on the last page of this paperwork. If you have not heard from [place] regarding the results in 2 weeks, please contact this office.         Signed, Merri Ray, MD [facility]

## 2020-11-11 NOTE — Patient Instructions (Addendum)
You have the option of additional gabapentin dose if needed for burning in the feet, but discuss with Dr. [name] first.   No other changes in meds at this time. Let me know if there are questions and take care.   If you have lab work done today you will be contacted with your lab results within the next 2 weeks.  If you have not heard from [place] then please contact us. The fastest way to get your results is to register for My Chart.   IF you received an x-ray today, you will receive an invoice from [facility]iology. Please contact [facility]iology at [phone] with questions or concerns regarding your invoice.   IF you received labwork today, you will receive an invoice from [place]. Please contact [facility] at [phone] with questions or concerns regarding your invoice.   Our billing staff will not be able to assist you with questions regarding bills from these companies.  You will be contacted with the lab results as soon as they are available. The fastest way to get your results is to activate your My Chart account. Instructions are located on the last page of this paperwork. If you have not heard from [place] regarding the results in 2 weeks, please contact this office.

## 2020-11-14 ENCOUNTER — Telehealth: Payer: Self-pay | Admitting: Neurology

## 2020-11-14 MED ORDER — CARBIDOPA-LEVODOPA 25-100 MG PO TABS: ORAL_TABLET | ORAL | 5 refills | Status: DC

## 2020-11-14 MED ORDER — DULOXETINE HCL 30 MG PO CPEP
30.0000 mg | ORAL_CAPSULE | Freq: Two times a day (BID) | ORAL | 1 refills | Status: DC
Start: 2020-11-14 — End: 2021-05-01

## 2020-11-14 NOTE — Addendum Note (Signed)
Addended by: Oliver Hum S on: 11/14/2020 11:45 AM   Modules accepted: Orders

## 2020-11-14 NOTE — Telephone Encounter (Signed)
Pt is requesting a refill for carbidopa-levodopa (SINEMET IR) 25-100 MG tablet  DULoxetine (CYMBALTA) 30 MG capsule.  Pharmacy: [facility] [phone]

## 2020-11-20 NOTE — Progress Notes (Signed)
Lab Letter sent  

## 2020-11-28 ENCOUNTER — Telehealth: Payer: Self-pay | Admitting: *Deleted

## 2020-11-28 NOTE — Telephone Encounter (Signed)
Schedule AWV.  

## 2020-11-29 ENCOUNTER — Telehealth: Payer: Self-pay | Admitting: Family Medicine

## 2020-11-29 NOTE — Telephone Encounter (Signed)
[name] (friend of patient) returned your call to schedule AWV. Please advise at [phone]

## 2020-12-05 ENCOUNTER — Encounter: Payer: Self-pay | Admitting: Neurology

## 2020-12-05 ENCOUNTER — Ambulatory Visit (INDEPENDENT_AMBULATORY_CARE_PROVIDER_SITE_OTHER): Payer: Medicare Other | Admitting: Neurology

## 2020-12-05 VITALS — BP 129/62 | HR 73 | Ht 75.0 in | Wt 173.0 lb

## 2020-12-05 DIAGNOSIS — R413 Other amnesia: Secondary | ICD-10-CM | POA: Diagnosis not present

## 2020-12-05 DIAGNOSIS — G6289 Other specified polyneuropathies: Secondary | ICD-10-CM

## 2020-12-05 DIAGNOSIS — R269 Unspecified abnormalities of gait and mobility: Secondary | ICD-10-CM | POA: Diagnosis not present

## 2020-12-05 MED ORDER — CARBIDOPA-LEVODOPA 25-100 MG PO TABS
ORAL_TABLET | ORAL | 1 refills | Status: DC
Start: 2020-12-05 — End: 2021-05-07

## 2020-12-05 NOTE — Patient Instructions (Signed)
We will go up on the Sinemet 25/100 to 1.5 tablets three times a day for 1 month, then take 2 tablets three times a day.  Sinemet (carbidopa) may result in confusion or hallucinations, drowsiness, nausea, or dizziness. If any significant side effects are noted, please contact our office. Sinemet may not be well absorbed when taken with high protein meals, if tolerated it is best to take 30-45 minutes before you eat.

## 2020-12-05 NOTE — Progress Notes (Signed)
Reason for visit: Peripheral neuropathy, gait disorder, parkinsonism  Casey Fisher is an 86 y.o. male  History of present illness:  Casey Fisher is a 86 year old right-handed black male with a history of a peripheral neuropathy associated with a gait disorder.  He has some troubles with memory, he is on low-dose Aricept and seems to tolerate this fairly well.  He has developed some features of parkinsonism, he was given a trial on low-dose Sinemet currently on the 25/100 mg tablet taking 1 tablet 3 times daily.  The caretaker indicates that on the medication he seemed to get some benefit with the ability to walk, but he has had some decline over the last several months.  He did get some physical therapy up until about 3 months ago.  He has not had any recent falls.  He has difficulty using a walker because he left the walker get too far ahead of him.  He returns for further evaluation.  He denies any issues with swallowing or choking.  Past Medical History:  Diagnosis Date  . Abnormality of gait 03/24/2016  . Anxiety   . Cancer ([place])   . Cataract   . Depression   . Heart murmur   . Hereditary and idiopathic peripheral neuropathy 03/24/2016  . Hypertension   . Memory difficulty 03/24/2016  . Neuromuscular disorder (Casey Fisher)   . Neuropathy   . Prostate cancer ([place])    remission    Past Surgical History:  Procedure Laterality Date  . CATARACT EXTRACTION Bilateral   . EYE SURGERY    . HERNIA REPAIR  1985  . INGUINAL HERNIA REPAIR Right 12/06/2013   Procedure: HERNIA REPAIR INGUINAL ADULT;  Surgeon: Merrie Roof, MD;  Location: [facility];  Service: General;  Laterality: Right;  . INSERTION OF MESH Right 12/06/2013   Procedure: INSERTION OF MESH;  Surgeon: Merrie Roof, MD;  Location: [facility];  Service: General;  Laterality: Right;  . PROSTATE SURGERY      Family History  Problem Relation Age of Onset  . Brain cancer Daughter   . Heart disease Mother   . Stroke Mother   .  Heart disease Father     Social history:  reports that he has quit smoking. He has never used smokeless tobacco. He reports that he does not drink alcohol and does not use drugs.    Allergies  Allergen Reactions  . Lyrica [Pregabalin] Other (See Comments)    "constipation"    Medications:  Prior to Admission medications   Medication Sig Start Date End Date Taking? Authorizing Provider  ammonium lactate (AMLACTIN) 12 % lotion Apply to both feet twice daily for dry skin 05/25/19  Yes [place], Stephani Police, DPM  aspirin EC 81 MG tablet Take 81 mg by mouth daily. Reported on 05/14/2016   Yes [provider]  Calcium Carb-Cholecalciferol (CALCIUM 600 + D PO) Take 1 tablet by mouth daily.   Yes [provider]  carbidopa-levodopa (SINEMET IR) 25-100 MG tablet 1 TABLET 3 TIMES DAILY 11/14/20  Yes Suzzanne Cloud, NP  cephALEXin (KEFLEX) 500 MG capsule Take 500 mg by mouth 2 (two) times daily. 11/08/20  Yes [provider]  donepezil (ARICEPT) 5 MG tablet Take 1 tablet (5 mg total) by mouth at bedtime. 10/09/20  Yes Suzzanne Cloud, NP  DULoxetine (CYMBALTA) 30 MG capsule Take 1 capsule (30 mg total) by mouth 2 (two) times daily. 11/14/20  Yes Suzzanne Cloud, NP  Al[facility] [place]  60 MG tablet Take 120 mg by mouth daily.  06/06/18  Yes [provider]  gabapentin (NEURONTIN) 600 MG tablet TAKE 2 TABLETS BY MOUTH IN THE MORNING AND TAKE 2 TABLETS BY MOUTH AT MIDDAY AND TAKE 1 TABLET IN THE EVENING 11/11/20  Yes [name], MD  hydrochlorothiazide (HYDRODIURIL) 12.5 MG tablet TAKE 1 TABLET(12.5 MG) BY MOUTH DAILY 11/11/20  Yes [name], MD  Leuprolide Acetate (LUPRON IJ) Inject 1 application as directed every 6 (six) months.   Yes [provider]  simvastatin (ZOCOR) 40 MG tablet TAKE 1 TABLET(40 MG) BY MOUTH DAILY 06/22/20  Yes [name], MD  tamsulosin (FLOMAX) 0.4 MG CAPS capsule Take 0.4 mg daily by mouth. Reported on 05/14/2016 08/17/15  Yes  [provider]    ROS:  Out of a complete 14 system review of symptoms, the patient complains only of the following symptoms, and all other reviewed systems are negative.  Walking difficulty Memory problems   Blood pressure 129/62, pulse 73, height 6\' 3"  (1.905 m), weight 173 lb (78.5 kg).  Physical Exam  General: The patient is alert and cooperative at the time of the examination.  Skin: No significant peripheral edema is noted.   Neurologic Exam  Mental status: The patient is alert and oriented x 3 at the time of the examination. The patient has apparent normal recent and remote memory, with an apparently normal attention span and concentration ability.   Cranial nerves: Facial symmetry is present. Speech is normal, no aphasia or dysarthria is noted. Extraocular movements are full, with exception some restriction of superior gaze. Visual fields are full.  Masking the face is seen.  Motor: The patient has good strength in all 4 extremities.  Sensory examination: Soft touch sensation is symmetric on the face, arms, and legs.  Coordination: The patient has good finger-nose-finger and heel-to-shin bilaterally.  Gait and station: The patient can stand with assistance, he stands with his knees flexed, he can take a few steps with examiner, at times he seems to freeze with his legs.  Reflexes: Deep tendon reflexes are symmetric.   Assessment/Plan:  1.  Gait disorder, parkinsonism  2.  Peripheral neuropathy  3.  Memory disorder  The patient will be increased on Sinemet, need to look out for increased confusion.  The patient has tolerated the medication well so far.  We will go to 1.5 of the 25/100 mg tablets 3 times daily for 1 month and then take 2 tablets 3 times daily.  We will set him up for physical therapy again.  He will follow-up in 5 months.  Jill Alexanders MD 12/05/2020 4:15 PM  [facility] [address] [place],  [place] [address]  Phone [phone] Fax [phone]

## 2020-12-09 ENCOUNTER — Ambulatory Visit (INDEPENDENT_AMBULATORY_CARE_PROVIDER_SITE_OTHER): Payer: Medicare Other | Admitting: Family Medicine

## 2020-12-09 VITALS — BP 129/62 | Ht 75.0 in | Wt 173.0 lb

## 2020-12-09 DIAGNOSIS — Z0000 Encounter for general adult medical examination without abnormal findings: Secondary | ICD-10-CM

## 2020-12-09 NOTE — Progress Notes (Signed)
Presents today for [employer] Visit   Date of last exam:11-11-2020   Interpreter used for this visit? No  I connected with  Casey Fisher on 12/09/20 by a telephone application and verified that I am speaking with the correct person using two identifiers.   I discussed the limitations of evaluation and management by telemedicine. The patient expressed understanding and agreed to proceed.  Patient location: home  Provider location: in office  I provided 20 minutes of non face - to - face time during this encounter.   Patient Care Team: Casey Agreste, MD as PCP - General (Family Medicine) Ple[place], Casey Gibson, RN as [facility]   Other items to address today:   Discussed Eye/Dental Discussed Immunizations Follow up with Dr. Carlota Fisher 5-16 @ 1:20   Other Screening: Last screening for diabetes: 11-11-2020 Last lipid screening:11-11-2020   ADVANCE DIRECTIVES: Discussed: yes On File: no Materials Provided: yes  Immunization status:  Immunization History  Administered Date(s) Administered  . Fluad Quad(high Dose 65+) 10/05/2019, 11/11/2020  . Influenza, High Dose Seasonal PF 11/21/2018  . Influenza,inj,Quad PF,6+ Mos 12/18/2013, 11/08/2014, 12/04/2015, 11/03/2016, 09/16/2017  . PFIZER SARS-COV-2 Vaccination 02/01/2020, 02/26/2020, 09/30/2020  . Pneumococcal Conjugate-13 04/18/2015  . Pneumococcal Polysaccharide-23 07/15/2015     Health Maintenance Due  Topic Date Due  . TETANUS/TDAP  Never done     Functional Status Survey: Is the patient deaf or have difficulty hearing?: No Does the patient have difficulty seeing, even when wearing glasses/contacts?: No Does the patient have difficulty concentrating, remembering, or making decisions?: Yes Does the patient have difficulty walking or climbing stairs?: Yes Does the patient have difficulty dressing or bathing?: No Does the patient have difficulty doing errands  alone such as visiting a doctor's office or shopping?: Yes   6CIT Screen 12/09/2020 12/09/2020 05/31/2019  What Year? 4 points 4 points 0 points  What month? 0 points 0 points 0 points  What time? 0 points 0 points 0 points  Count back from 20 0 points 0 points 0 points  Months in reverse 4 points 4 points 0 points  Repeat phrase 4 points - 2 points  Total Score 12 - 2      Flowsheet Row Clinical Support from 12/09/2020 in [facility]  AUDIT-C Score 0       Home Environment:   Lives home alone.  Casey Fisher from church transports him helps with food/bills,medications lives close goes over everyday Patient states he can bathe himself  Yes trouble climbing stairs   Lives alone one story  No scattered rugs Adequate lighting/ clutter Yes grab bars   Patient Active Problem List   Diagnosis Date Noted  . Primary osteoarthritis of right knee 05/23/2020  . Low back pain 08/03/2019  . Acute pain of left shoulder 10/10/2018  . Chest pain 11/08/2017  . Memory difficulty 03/24/2016  . Hereditary and idiopathic peripheral neuropathy 03/24/2016  . Abnormality of gait 03/24/2016  . Peripheral neuropathy 04/18/2015  . Hyperlipidemia 04/18/2015  . Prostate cancer ([place]) 08/24/2014  . Right inguinal hernia 10/30/2013     Past Medical History:  Diagnosis Date  . Abnormality of gait 03/24/2016  . Anxiety   . Cancer ([place])   . Cataract   . Depression   . Heart murmur   . Hereditary and idiopathic peripheral neuropathy 03/24/2016  . Hypertension   . Memory difficulty 03/24/2016  . Neuromuscular disorder ([place])   . Neuropathy   .  Prostate cancer ([place])    remission     Past Surgical History:  Procedure Laterality Date  . CATARACT EXTRACTION Bilateral   . EYE SURGERY    . HERNIA REPAIR  1985  . INGUINAL HERNIA REPAIR Right 12/06/2013   Procedure: HERNIA REPAIR INGUINAL ADULT;  Surgeon: Casey Roof, MD;  Location: [facility];  Service: General;  Laterality: Right;   . INSERTION OF MESH Right 12/06/2013   Procedure: INSERTION OF MESH;  Surgeon: Casey Roof, MD;  Location: [facility];  Service: General;  Laterality: Right;  . PROSTATE SURGERY       Family History  Problem Relation Age of Onset  . Brain cancer Daughter   . Heart disease Mother   . Stroke Mother   . Heart disease Father      Social History   Socioeconomic History  . Marital status: Widowed    Spouse name: Not on file  . Number of children: Not on file  . Years of education: Not on file  . Highest education level: Not on file  Occupational History  . Not on file  Tobacco Use  . Smoking status: Former Research scientist (life sciences)  . Smokeless tobacco: Never Used  Vaping Use  . Vaping Use: Never used  Substance and Sexual Activity  . Alcohol use: No  . Drug use: No  . Sexual activity: Not on file  Other Topics Concern  . Not on file  Social History Narrative   Lives home alone.  Casey Fisher from church transports him   Right Handed   Drinks no caffeine   Social Determinants of Radio broadcast assistant Strain: Not on file  Food Insecurity: No Food Insecurity  . Worried About Charity fundraiser in the Last Year: Never true  . Ran Out of Food in the Last Year: Never true  Transportation Needs: No Transportation Needs  . Lack of Transportation (Medical): No  . Lack of Transportation (Non-Medical): No  Physical Activity: Not on file  Stress: Not on file  Social Connections: Not on file  Intimate Partner Violence: Not on file     Allergies  Allergen Reactions  . Lyrica [Pregabalin] Other (See Comments)    "constipation"     Prior to Admission medications   Medication Sig Start Date End Date Taking? Authorizing Provider  ammonium lactate (AMLACTIN) 12 % lotion Apply to both feet twice daily for dry skin 05/25/19  Yes Fisher, Casey Police, DPM  aspirin EC 81 MG tablet Take 81 mg by mouth daily. Reported on 05/14/2016   Yes [provider]  Calcium  Carb-Cholecalciferol (CALCIUM 600 + D PO) Take 1 tablet by mouth daily.   Yes [provider]  carbidopa-levodopa (SINEMET IR) 25-100 MG tablet 1.5 tablets three times a day for 1 month, then take 2 tablets three times a day 12/05/20  Yes Casey Ducking, MD  cephALEXin (KEFLEX) 500 MG capsule Take 500 mg by mouth 2 (two) times daily. 11/08/20  Yes [provider]  donepezil (ARICEPT) 5 MG tablet Take 1 tablet (5 mg total) by mouth at bedtime. 10/09/20  Yes Suzzanne Cloud, NP  DULoxetine (CYMBALTA) 30 MG capsule Take 1 capsule (30 mg total) by mouth 2 (two) times daily. 11/14/20  Yes Suzzanne Cloud, NP  ERLEADA 60 MG tablet Take 120 mg by mouth daily.  06/06/18  Yes [provider]  gabapentin (NEURONTIN) 600 MG tablet TAKE 2 TABLETS BY MOUTH IN THE MORNING AND  TAKE 2 TABLETS BY MOUTH AT MIDDAY AND TAKE 1 TABLET IN THE EVENING 11/11/20  Yes Casey Agreste, MD  hydrochlorothiazide (HYDRODIURIL) 12.5 MG tablet TAKE 1 TABLET(12.5 MG) BY MOUTH DAILY 11/11/20  Yes Casey Agreste, MD  Leuprolide Acetate (LUPRON IJ) Inject 1 application as directed every 6 (six) months.   Yes [provider]  simvastatin (ZOCOR) 40 MG tablet TAKE 1 TABLET(40 MG) BY MOUTH DAILY 06/22/20  Yes Casey Agreste, MD  tamsulosin (FLOMAX) 0.4 MG CAPS capsule Take 0.4 mg daily by mouth. Reported on 05/14/2016 08/17/15  Yes [provider]     Depression screen [facility] 2/9 11/11/2020 01/25/2020 01/19/2020 12/06/2019 10/31/2019  Decreased Interest 0 0 0 0 0  Down, Depressed, Hopeless 0 0 0 0 0  PHQ - 2 Score 0 0 0 0 0     Fall Risk  12/09/2020 11/11/2020 09/10/2020 07/08/2020 04/02/2020  Falls in the past year? 1 1 1 1 1   Comment - - - - -  Number falls in past yr: 0 0 1 1 1   Comment - - - - -  Injury with Fall? 1 0 0 0 0  Risk Factor Category  - - - - -  Risk for fall due to : - - History of fall(s);Impaired balance/gait;Impaired mobility History of fall(s);Impaired  balance/gait;Impaired mobility History of fall(s);Impaired balance/gait;Impaired mobility  Follow up Falls evaluation completed;Education provided Falls evaluation completed Falls evaluation completed Falls evaluation completed;Falls prevention discussed Falls evaluation completed;Education provided      PHYSICAL EXAM: BP 129/62 Comment: taken from a previous visit/ not in clinic  Ht 6\' 3"  (1.905 m)   Wt 173 lb (78.5 kg)   BMI 21.62 kg/m    Wt Readings from Last 3 Encounters:  12/09/20 173 lb (78.5 kg)  12/05/20 173 lb (78.5 kg)  11/11/20 172 lb (78 kg)       Education/Counseling provided regarding diet and exercise, prevention of chronic diseases, smoking/tobacco cessation, if applicable, and reviewed "Covered Medicare Preventive Services."

## 2020-12-09 NOTE — Patient Instructions (Addendum)
Thank you for taking time to come for your Medicare Wellness Visit. I appreciate your ongoing commitment to your health goals. Please review the following plan we discussed and let me know if I can assist you in the future.  Casey Kennedy LPN  Understanding Your Risk for Falls Each year, millions of people have serious injuries from falls. It is important to understand your risk for falling. Talk with your health care provider about your risk and what you can do to lower it. There are actions you can take at home to lower your risk. If you do have a serious fall, make sure you tell your health care provider. Falling once raises your risk for falling again. How can falls affect me? Serious injuries from falls are common. These include:  Broken bones, such as hip fractures.  Head injuries, such as traumatic brain injuries (TBI). Fear of falling can also cause you to avoid activities and stay at home. This can make your muscles weaker and actually raise your risk for a fall. What can increase my risk? There are a number of risk factors that increase your risk for falling. The more risk factors you have, the higher your risk for falling. Serious injuries from a fall most often happen to people older than age 62. Children and young adults ages 76-29 are also at higher risk. Common risk factors include:  Weakness in the lower body.  Lack (deficiency) of vitamin D.  Being generally weak or confused due to long-term (chronic) illness.  Dizziness or balance problems.  Poor vision.  Medicines that cause dizziness or drowsiness. These can include medicines for your blood pressure, heart, anxiety, insomnia, or edema, as well as pain medicines and muscle relaxants. Other risk factors include:  Drinking alcohol.  Having had a fall in the past.  Having depression.  Foot pain or improper footwear.  Working at a dangerous job.  Having any of the following in your home: ? Tripping hazards, such  as floor clutter or loose rugs. ? Poor lighting. ? Pets or clutter.  Dementia or memory loss. What actions can I take to lower my risk of falling?     Physical activity Maintain physical fitness. Do strength and balance exercises. Consider taking a regular class to build strength and balance. Yoga and tai chi are good options. Vision Have your eyes checked every year and your vision prescription updated as needed. Walking aids and footwear  Wear nonskid shoes. Do not wear high heels.  Do not walk around the house in socks or slippers.  Use a cane or walker as told by your health care provider. Home safety  Attach secure railings on both sides of your stairs.  Install grab bars for your tub, shower, and toilet. Use a bath mat in your tub or shower.  Use good lighting in all rooms. Keep a flashlight near your bed.  Make sure there is a clear path from your bed to the bathroom. Use night-lights.  Do not use throw rugs. Make sure all carpeting is taped or tacked down securely.  Remove all clutter from walkways and stairways, including extension cords.  Repair uneven or broken steps.  Avoid walking on icy or slippery surfaces. Walk on the grass instead of on icy or slick sidewalks. Where you can, use ice melt to get rid of ice on walkways.  Use a cordless phone. Questions to ask your health care provider  Can you help me check my risk for a fall?  Do any of my medicines make me more likely to fall?  Should I take a vitamin D supplement?  What exercises can I do to improve my strength and balance?  Should I make an appointment to have my vision checked?  Do I need a bone density test to check for weak bones or osteoporosis?  Would it help to use a cane or a walker? Where to find more information  Centers for Disease Control and Prevention, STEADI: http://www.wolf.info/  Community-Based Fall Prevention Programs: http://www.wolf.info/  National Institute on Aging:  ToneConnect.com.ee Contact a health care provider if:  You fall at home.  You are afraid of falling at home.  You feel weak, drowsy, or dizzy. Summary  People 74 and older are at high risk for falling. However, older people are not the only ones injured in falls. Children and young adults have a higher-than-normal risk too.  Talk with your health care provider about your risks for falling and how to lower those risks.  Taking certain precautions at home can lower your risk for falling.  If you fall, always tell your health care provider. This information is not intended to replace advice given to you by your health care provider. Make sure you discuss any questions you have with your health care provider. Document Revised: 06/21/2019 Document Reviewed: 06/21/2019 Elsevier Patient Education  [name] 65 Years and Older, Male Preventive care refers to lifestyle choices and visits with your health care provider that can promote health and wellness. This includes:  A yearly physical exam. This is also called an annual well check.  Regular dental and eye exams.  Immunizations.  Screening for certain conditions.  Healthy lifestyle choices, such as diet and exercise. What can I expect for my preventive care visit? Physical exam Your health care provider will check:  Height and weight. These may be used to calculate body mass index (BMI), which is a measurement that tells if you are at a healthy weight.  Heart rate and blood pressure.  Your skin for abnormal spots. Counseling Your health care provider may ask you questions about:  Alcohol, tobacco, and drug use.  Emotional well-being.  Home and relationship well-being.  Sexual activity.  Eating habits.  History of falls.  Memory and ability to understand (cognition).  Work and work Statistician. What immunizations do I need?  Influenza (flu) vaccine  This is recommended every  year. Tetanus, diphtheria, and pertussis (Tdap) vaccine  You may need a Td booster every 10 years. Varicella (chickenpox) vaccine  You may need this vaccine if you have not already been vaccinated. Zoster (shingles) vaccine  You may need this after age 40. Pneumococcal conjugate (PCV13) vaccine  One dose is recommended after age 72. Pneumococcal polysaccharide (PPSV23) vaccine  One dose is recommended after age 37. Measles, mumps, and rubella (MMR) vaccine  You may need at least one dose of MMR if you were born in 1957 or later. You may also need a second dose. Meningococcal conjugate (MenACWY) vaccine  You may need this if you have certain conditions. Hepatitis A vaccine  You may need this if you have certain conditions or if you travel or work in places where you may be exposed to hepatitis A. Hepatitis B vaccine  You may need this if you have certain conditions or if you travel or work in places where you may be exposed to hepatitis B. Haemophilus influenzae type b (Hib) vaccine  You may need this if you have  certain conditions. You may receive vaccines as individual doses or as more than one vaccine together in one shot (combination vaccines). Talk with your health care provider about the risks and benefits of combination vaccines. What tests do I need? Blood tests  Lipid and cholesterol levels. These may be checked every 5 years, or more frequently depending on your overall health.  Hepatitis C test.  Hepatitis B test. Screening  Lung cancer screening. You may have this screening every year starting at age 81 if you have a 30-pack-year history of smoking and currently smoke or have quit within the past 15 years.  Colorectal cancer screening. All adults should have this screening starting at age 57 and continuing until age 76. Your health care provider may recommend screening at age 18 if you are at increased risk. You will have tests every 1-10 years, depending on  your results and the type of screening test.  Prostate cancer screening. Recommendations will vary depending on your family history and other risks.  Diabetes screening. This is done by checking your blood sugar (glucose) after you have not eaten for a while (fasting). You may have this done every 1-3 years.  Abdominal aortic aneurysm (AAA) screening. You may need this if you are a current or former smoker.  Sexually transmitted disease (STD) testing. Follow these instructions at home: Eating and drinking  Eat a diet that includes fresh fruits and vegetables, whole grains, lean protein, and low-fat dairy products. Limit your intake of foods with high amounts of sugar, saturated fats, and salt.  Take vitamin and mineral supplements as recommended by your health care provider.  Do not drink alcohol if your health care provider tells you not to drink.  If you drink alcohol: ? Limit how much you have to 0-2 drinks a day. ? Be aware of how much alcohol is in your drink. In the [place] one drink equals one 12 oz bottle of beer (355 mL), one 5 oz glass of wine (148 mL), or one 1 oz glass of hard liquor (44 mL). Lifestyle  Take daily care of your teeth and gums.  Stay active. Exercise for at least 30 minutes on 5 or more days each week.  Do not use any products that contain nicotine or tobacco, such as cigarettes, e-cigarettes, and chewing tobacco. If you need help quitting, ask your health care provider.  If you are sexually active, practice safe sex. Use a condom or other form of protection to prevent STIs (sexually transmitted infections).  Talk with your health care provider about taking a low-dose aspirin or statin. What's next?  Visit your health care provider once a year for a well check visit.  Ask your health care provider how often you should have your eyes and teeth checked.  Stay up to date on all vaccines. This information is not intended to replace advice given to you by  your health care provider. Make sure you discuss any questions you have with your health care provider. Document Revised: 12/08/2018 Document Reviewed: 12/08/2018 Elsevier Patient Education  2020 [place] Directive  Advance directives are legal documents that let you make choices ahead of time about your health care and medical treatment in case you become unable to communicate for yourself. Advance directives are a way for you to make known your wishes to family, friends, and health care providers. This can let others know about your end-of-life care if you become unable to communicate. Discussing and writing advance directives should happen  over time rather than all at once. Advance directives can be changed depending on your situation and what you want, even after you have signed the advance directives. There are different types of advance directives, such as:  Medical power of attorney.  Living will.  Do not resuscitate (DNR) or do not attempt resuscitation (DNAR) order. Health care proxy and medical power of attorney A health care proxy is also called a health care agent. This is a person who is appointed to make medical decisions for you in cases where you are unable to make the decisions yourself. Generally, people choose someone they know well and trust to represent their preferences. Make sure to ask this person for an agreement to act as your proxy. A proxy may have to exercise judgment in the event of a medical decision for which your wishes are not known. A medical power of attorney is a legal document that names your health care proxy. Depending on the laws in your state, after the document is written, it may also need to be:  Signed.  Notarized.  Dated.  Copied.  Witnessed.  Incorporated into your medical record. You may also want to appoint someone to manage your money in a situation in which you are unable to do so. This is called a durable power of attorney  for finances. It is a separate legal document from the durable power of attorney for health care. You may choose the same person or someone different from your health care proxy to act as your agent in money matters. If you do not appoint a proxy, or if there is a concern that the proxy is not acting in your best interests, a court may appoint a guardian to act on your behalf. Living will A living will is a set of instructions that state your wishes about medical care when you cannot express them yourself. Health care providers should keep a copy of your living will in your medical record. You may want to give a copy to family members or friends. To alert caregivers in case of an emergency, you can place a card in your wallet to let them know that you have a living will and where they can find it. A living will is used if you become:  Terminally ill.  Disabled.  Unable to communicate or make decisions. Items to consider in your living will include:  To use or not to use life-support equipment, such as dialysis machines and breathing machines (ventilators).  A DNR or DNAR order. This tells health care providers not to use cardiopulmonary resuscitation (CPR) if breathing or heartbeat stops.  To use or not to use tube feeding.  To be given or not to be given food and fluids.  Comfort (palliative) care when the goal becomes comfort rather than a cure.  Donation of organs and tissues. A living will does not give instructions for distributing your money and property if you should pass away. DNR or DNAR A DNR or DNAR order is a request not to have CPR in the event that your heart stops beating or you stop breathing. If a DNR or DNAR order has not been made and shared, a health care provider will try to help any patient whose heart has stopped or who has stopped breathing. If you plan to have surgery, talk with your health care provider about how your DNR or DNAR order will be followed if problems  occur. What if I do not have an advance  directive? If you do not have an advance directive, some states assign family decision makers to act on your behalf based on how closely you are related to them. Each state has its own laws about advance directives. You may want to check with your health care provider, attorney, or state representative about the laws in your state. Summary  Advance directives are the legal documents that allow you to make choices ahead of time about your health care and medical treatment in case you become unable to tell others about your care.  The process of discussing and writing advance directives should happen over time. You can change the advance directives, even after you have signed them.  Advance directives include DNR or DNAR orders, living wills, and designating an agent as your medical power of attorney. This information is not intended to replace advice given to you by your health care provider. Make sure you discuss any questions you have with your health care provider. Document Revised: 07/13/2019 Document Reviewed: 07/13/2019 Elsevier Patient Education  [place].

## 2020-12-11 ENCOUNTER — Other Ambulatory Visit: Payer: Self-pay | Admitting: *Deleted

## 2020-12-18 ENCOUNTER — Ambulatory Visit (INDEPENDENT_AMBULATORY_CARE_PROVIDER_SITE_OTHER): Payer: Medicare Other | Admitting: Podiatry

## 2020-12-18 ENCOUNTER — Encounter: Payer: Self-pay | Admitting: Podiatry

## 2020-12-18 ENCOUNTER — Other Ambulatory Visit: Payer: Self-pay

## 2020-12-18 DIAGNOSIS — M79675 Pain in left toe(s): Secondary | ICD-10-CM

## 2020-12-18 DIAGNOSIS — L84 Corns and callosities: Secondary | ICD-10-CM

## 2020-12-18 DIAGNOSIS — B351 Tinea unguium: Secondary | ICD-10-CM

## 2020-12-18 DIAGNOSIS — M79674 Pain in right toe(s): Secondary | ICD-10-CM | POA: Diagnosis not present

## 2020-12-18 DIAGNOSIS — I739 Peripheral vascular disease, unspecified: Secondary | ICD-10-CM

## 2020-12-18 NOTE — Patient Outreach (Signed)
[facility]) Care Management  [facility] Care Manager  [phone] Late entry  Casey Fisher 11-28-1928 [phone]  Casey Fisher attempted follow up outreach call to patient.  Patient was unavailable. HIPPA compliance voicemail message left with return callback number.RN health coach called the patient caregiver Casey Fisher.  Per caregiver BP readings are as follows 126/73,  105/59,139/66,130/82. Reading have much improved.  Physical therapy will restart working with patient. Patient has gotten COVID vaccines. Caregivers are monitoring the patient intake of sodium. Patient has agreed to further outreach calls.  Encounter Medications:  Outpatient Encounter Medications as of 12/11/2020  Medication Sig  . ammonium lactate (AMLACTIN) 12 % lotion Apply to both feet twice daily for dry skin  . aspirin EC 81 MG tablet Take 81 mg by mouth daily. Reported on 05/14/2016  . Calcium Carb-Cholecalciferol (CALCIUM 600 + D PO) Take 1 tablet by mouth daily.  . carbidopa-levodopa (SINEMET IR) 25-100 MG tablet 1.5 tablets three times a day for 1 month, then take 2 tablets three times a day  . cephALEXin (KEFLEX) 500 MG capsule Take 500 mg by mouth 2 (two) times daily.  Marland Kitchen donepezil (ARICEPT) 5 MG tablet Take 1 tablet (5 mg total) by mouth at bedtime.  . DULoxetine (CYMBALTA) 30 MG capsule Take 1 capsule (30 mg total) by mouth 2 (two) times daily.  . ERLEADA 60 MG tablet Take 120 mg by mouth daily.   Marland Kitchen gabapentin (NEURONTIN) 600 MG tablet TAKE 2 TABLETS BY MOUTH IN THE MORNING AND TAKE 2 TABLETS BY MOUTH AT MIDDAY AND TAKE 1 TABLET IN THE EVENING  . hydrochlorothiazide (HYDRODIURIL) 12.5 MG tablet TAKE 1 TABLET(12.5 MG) BY MOUTH DAILY  . Leuprolide Acetate (LUPRON IJ) Inject 1 application as directed every 6 (six) months.  . simvastatin (ZOCOR) 40 MG tablet TAKE 1 TABLET(40 MG) BY MOUTH DAILY  . tamsulosin (FLOMAX) 0.4 MG CAPS capsule Take 0.4 mg daily by mouth. Reported on 05/14/2016    No facility-administered encounter medications on file as of 12/11/2020.    Functional Status:  In your present state of health, do you have any difficulty performing the following activities: 12/09/2020 04/02/2020  Hearing? N Y  Comment - need hearing aide  Vision? N N  Difficulty concentrating or making decisions? Y N  Walking or climbing stairs? Y Y  Comment - uses cane or walker  Dressing or bathing? N N  Doing errands, shopping? Tempie Donning  Comment - caregiver takes patient  Conservation officer, nature and eating ? Y Y  Comment - patient has caregivers that prepares his food  Using the Toilet? N -  In the past six months, have you accidently leaked urine? N Y  Do you have problems with loss of bowel control? N N  Managing your Medications? Y N  Managing your Finances? Y N  Housekeeping or managing your Housekeeping? [place] givers  Some recent data might be hidden    Fall/Depression Screening: Fall Risk  12/11/2020 12/09/2020 11/11/2020  Falls in the past year? _0 Comment - - -  Number falls in past yr: 0 0 0  Comment - - -  Injury with Fall? 1 1 0  Risk Factor Category  - - -  Risk for fall due to : History of fall(s);Impaired balance/gait;Impaired mobility - -  Follow up Falls evaluation completed;Falls prevention discussed Falls evaluation completed;Education provided Falls evaluation completed   [facility] 2/9 Scores 11/11/2020 01/25/2020 01/19/2020 12/06/2019 10/31/2019 10/05/2019  08/31/2019  PHQ - 2 Score 0 0 0 0 0 0 0    Assessment:  Goals Addressed            This Visit's Progress   . (THN)Track and Manage My Blood Pressure-Hypertension       Timeframe:  Short-Term Goal Priority:  High Start Date:        [id number]                     Expected End Date:       [id number]                Follow Up Date [id number]   - check blood pressure daily - choose a place to take my blood pressure (home, clinic or office, retail store) - write blood pressure results in a log or  diary    Why is this important?    You won't feel high blood pressure, but it can still hurt your blood vessels.   High blood pressure can cause heart or kidney problems. It can also cause a stroke.   Making lifestyle changes like losing a little weight or eating less salt will help.   Checking your blood pressure at home and at different times of the day can help to control blood pressure.   If the doctor prescribes medicine remember to take it the way the doctor ordered.   Call the office if you cannot afford the medicine or if there are questions about it.     Notes:  Patient caregiver is monitoring daily and documenting and providing all reading to health coach on follow ups calls    . Blood Pressure < 140/90       CARE PLAN ENTRY Timeframe:  Long-Range Goal Priority:  High Start Date:  [id number]                           Expected End Date:    [id number]                  (see longtitudinal plan of care for additional care plan information)  Objective:  . Last practice recorded BP readings:  BP Readings from Last 3 Encounters:  06/04/20 135/65  02/02/20 (!) 145/73  01/25/20 132/66 .   Marland Kitchen Most recent eGFR/CrCl: No results found for: EGFR  No components found for: CRCL  Current Barriers:  Marland Kitchen Knowledge deficit related to self care management of hypertension . Cognitive Deficits  Case Manager Clinical Goal(s):  Marland Kitchen Over the next 90 days, patient will attend all scheduled medical appointments:   . Over the next 90 days, patient will demonstrate improved adherence to prescribed treatment plan for hypertension as evidenced by taking all medications as prescribed, monitoring and recording blood pressure as directed, adhering to low sodium/DASH diet . Over the next 90 days, patient will demonstrate improved health management independence as evidenced by checking blood pressure as directed and notifying PCP if SBP>180 or DBP > 96, taking all medications as prescribe, and adhering to a  low sodium diet as discussed. . Over the next 90 days, patient will verbalize basic understanding of hypertension disease process and self health management plan as evidenced by adhering to medication, keeping appointments and checking and documenting blood pressures by caregiver.  Interventions:  . Evaluation of current treatment plan related to hypertension self management and patient's adherence to plan as established by provider. . Reviewed medications with patient  and discussed importance of compliance . Discussed plans with patient for ongoing care management follow up and provided patient with direct contact information for care management team . Advised patient, providing education and rationale, to monitor blood pressure daily and record, calling PCP for findings outside established parameters.  . Reviewed scheduled/upcoming provider appointments including:  . Provided education regarding s/s of stroke and stroke prevention . Provided education regarding s/s of heart attack . Provided education regarding s/s of DASH diet/Low salt diet . Provided education regarding complications of uncontrolled blood pressure . Provided education regarding increasing physical activity . Provided educational material: Matter of Choice Blood Pressure Control . Provided educational material: Exercise Program Activity Book  Patient Self Care Activities:  . Attends all scheduled provider appointments . Calls provider office for new concerns, questions, or BP outside discussed parameters . Monitors BP and records as discussed . Adheres to a low sodium diet/DASH diet . Increase physical activity as tolerated  Patient recent blood pressures are on better range of 130/82-105/59 RN will follow up outreach within the month of March     . Prevent falls   On track    No falls since last outreach       Plan:  Follow-up:  Patient agrees to Care Plan and Follow-up. Physical therapy will restart care with  patient Patient caregiver will schedule eye exam Patient caregiver will continue to monitor and document BP Patient caregivers will monitor the sodium intake RN will follow up outreach within the month of March RN sent update assessment to PCP  [place] Management [phone]

## 2020-12-18 NOTE — Patient Instructions (Signed)
Goals Addressed            This Visit's Progress    (THN)Track and Manage My Blood Pressure-Hypertension       Timeframe:  Short-Term Goal Priority:  High Start Date:        [id number]                     Expected End Date:       [id number]                Follow Up Date [id number]   - check blood pressure daily - choose a place to take my blood pressure (home, clinic or office, retail store) - write blood pressure results in a log or diary    Why is this important?    You won't feel high blood pressure, but it can still hurt your blood vessels.   High blood pressure can cause heart or kidney problems. It can also cause a stroke.   Making lifestyle changes like losing a little weight or eating less salt will help.   Checking your blood pressure at home and at different times of the day can help to control blood pressure.   If the doctor prescribes medicine remember to take it the way the doctor ordered.   Call the office if you cannot afford the medicine or if there are questions about it.     Notes:  Patient caregiver is monitoring daily and documenting and providing all reading to health coach on follow ups calls     Blood Pressure < 140/90       CARE PLAN ENTRY Timeframe:  Long-Range Goal Priority:  High Start Date:  [id number]                           Expected End Date:    [id number]                  (see longtitudinal plan of care for additional care plan information)  Objective:   Last practice recorded BP readings:  BP Readings from Last 3 Encounters:  06/04/20 135/65  02/02/20 (!) 145/73  01/25/20 132/66     Most recent eGFR/CrCl: No results found for: EGFR  No components found for: CRCL  Current Barriers:   Knowledge deficit related to self care management of hypertension  Cognitive Deficits  Case Manager Clinical Goal(s):   Over the next 90 days, patient will attend all scheduled medical appointments:    Over the next 90 days, patient will  demonstrate improved adherence to prescribed treatment plan for hypertension as evidenced by taking all medications as prescribed, monitoring and recording blood pressure as directed, adhering to low sodium/DASH diet  Over the next 90 days, patient will demonstrate improved health management independence as evidenced by checking blood pressure as directed and notifying PCP if SBP>180 or DBP > 96, taking all medications as prescribe, and adhering to a low sodium diet as discussed.  Over the next 90 days, patient will verbalize basic understanding of hypertension disease process and self health management plan as evidenced by adhering to medication, keeping appointments and checking and documenting blood pressures by caregiver.  Interventions:   Evaluation of current treatment plan related to hypertension self management and patient's adherence to plan as established by provider.  Reviewed medications with patient and discussed importance of compliance  Discussed plans with patient for ongoing care management follow up and provided patient  with direct contact information for care management team  Advised patient, providing education and rationale, to monitor blood pressure daily and record, calling PCP for findings outside established parameters.   Reviewed scheduled/upcoming provider appointments including:   Provided education regarding s/s of stroke and stroke prevention  Provided education regarding s/s of heart attack  Provided education regarding s/s of DASH diet/Low salt diet  Provided education regarding complications of uncontrolled blood pressure  Provided education regarding increasing physical activity  Provided educational material: Matter of Choice Blood Pressure Control  Provided educational material: Exercise Program Activity Book  Patient Self Care Activities:   Attends all scheduled provider appointments  Calls provider office for new concerns, questions, or BP  outside discussed parameters  Monitors BP and records as discussed  Adheres to a low sodium diet/DASH diet  Increase physical activity as tolerated  Patient recent blood pressures are on better range of 130/82-105/59 RN will follow up outreach within the month of March      Prevent falls   On track    No falls since last outreach

## 2020-12-27 NOTE — Progress Notes (Signed)
Subjective:  Patient ID: Casey Fisher, male    DOB: 1928/08/05,  MRN: [id number]  Casey Fisher presents to clinic today for for at risk foot care. Patient has h/o PAD and painful thick toenails that are difficult to trim. Pain interferes with ambulation. Aggravating factors include wearing enclosed shoe gear. Pain is relieved with periodic professional debridement..  86 y.o. male presents with the above complaint. He is accompanied by a caregiver on today's visit.  PCP is Dr. Merri Ray. Last visit was 12/09/2020. He voices no new pedal concerns on today's visit.  Review of Systems: Negative except as noted in the HPI. Past Medical History:  Diagnosis Date  . Abnormality of gait 03/24/2016  . Anxiety   . Cancer ([place])   . Cataract   . Depression   . Heart murmur   . Hereditary and idiopathic peripheral neuropathy 03/24/2016  . Hypertension   . Memory difficulty 03/24/2016  . Neuromuscular disorder ([place])   . Neuropathy   . Prostate cancer ([place])    remission   Past Surgical History:  Procedure Laterality Date  . CATARACT EXTRACTION Bilateral   . EYE SURGERY    . HERNIA REPAIR  1985  . INGUINAL HERNIA REPAIR Right 12/06/2013   Procedure: HERNIA REPAIR INGUINAL ADULT;  Surgeon: Merrie Roof, MD;  Location: [place];  Service: General;  Laterality: Right;  . INSERTION OF MESH Right 12/06/2013   Procedure: INSERTION OF MESH;  Surgeon: Merrie Roof, MD;  Location: [place];  Service: General;  Laterality: Right;  . PROSTATE SURGERY      Current Outpatient Medications:  .  ammonium lactate (AMLACTIN) 12 % lotion, Apply to both feet twice daily for dry skin, Disp: 396 g, Rfl: 4 .  aspirin EC 81 MG tablet, Take 81 mg by mouth daily. Reported on 05/14/2016, Disp: , Rfl:  .  Calcium Carb-Cholecalciferol (CALCIUM 600 + D PO), Take 1 tablet by mouth daily., Disp: , Rfl:  .  carbidopa-levodopa (SINEMET IR) 25-100 MG tablet, 1.5 tablets three times a day for 1 month, then take 2  tablets three times a day, Disp: 540 tablet, Rfl: 1 .  cephALEXin (KEFLEX) 500 MG capsule, Take 500 mg by mouth 2 (two) times daily., Disp: , Rfl:  .  donepezil (ARICEPT) 5 MG tablet, Take 1 tablet (5 mg total) by mouth at bedtime., Disp: 90 tablet, Rfl: 1 .  DULoxetine (CYMBALTA) 30 MG capsule, Take 1 capsule (30 mg total) by mouth 2 (two) times daily., Disp: 180 capsule, Rfl: 1 .  ERLEADA 60 MG tablet, Take 120 mg by mouth daily. , Disp: , Rfl:  .  gabapentin (NEURONTIN) 600 MG tablet, TAKE 2 TABLETS BY MOUTH IN THE MORNING AND TAKE 2 TABLETS BY MOUTH AT MIDDAY AND TAKE 1 TABLET IN THE EVENING, Disp: 450 tablet, Rfl: 1 .  hydrochlorothiazide (HYDRODIURIL) 12.5 MG tablet, TAKE 1 TABLET(12.5 MG) BY MOUTH DAILY, Disp: 90 tablet, Rfl: 1 .  Leuprolide Acetate (LUPRON IJ), Inject 1 application as directed every 6 (six) months., Disp: , Rfl:  .  simvastatin (ZOCOR) 40 MG tablet, TAKE 1 TABLET(40 MG) BY MOUTH DAILY, Disp: 90 tablet, Rfl: 2 .  tamsulosin (FLOMAX) 0.4 MG CAPS capsule, Take 0.4 mg daily by mouth. Reported on 05/14/2016, Disp: , Rfl: 1 Allergies  Allergen Reactions  . Lyrica [Pregabalin] Other (See Comments)    "constipation"   Social History   Occupational History  . Not on file  Tobacco Use  .  Smoking status: Former Games developer  . Smokeless tobacco: Never Used  Vaping Use  . Vaping Use: Never used  Substance and Sexual Activity  . Alcohol use: No  . Drug use: No  . Sexual activity: Not on file    Objective:   Constitutional Casey Fisher is a pleasant 86 y.o. African American male, WD, WN in NAD.Marland Kitchen AAO x 3.   Vascular Capillary refill time to digits immediate b/l. Palpable DP pulse(s) b/l lower extremities Nonpalpable PT pulse(s) b/l lower extremities. Pedal hair absent. Lower extremity skin temperature gradient within normal limits.  No cyanosis or clubbing noted.  Neurologic Normal speech. Oriented to person, place, and time. Epicritic sensation to light touch grossly  present bilaterally. Protective sensation diminished with 10g monofilament b/l.  Dermatologic Pedal skin with normal turgor, texture and tone bilaterally. No open wounds bilaterally. No interdigital macerations bilaterally. Toenails 1-5 b/l elongated, discolored, dystrophic, thickened, crumbly with subungual debris and tenderness to dorsal palpation. Hyperkeratotic lesion(s) submet head 1 left foot.  No erythema, no edema, no drainage, no flocculence.  Orthopedic: Normal muscle strength 5/5 to all lower extremity muscle groups bilaterally. No pain crepitus or joint limitation noted with ROM b/l. Hallux valgus with bunion deformity noted b/l lower extremities. Hammertoes noted to the b/l lower extremities. Utilizes wheelchair for mobility assistance.   Radiographs: None Assessment:   1. Pain due to onychomycosis of toenails of both feet   2. Callus   3. PAD (peripheral artery disease) (HCC)    Plan:  Patient was evaluated and treated and all questions answered.  Onychomycosis with pain -Nails palliatively debridement as below -Educated on self-care  Procedure: Nail Debridement Rationale: Pain Type of Debridement: manual, sharp debridement. Instrumentation: Nail nipper, rotary burr. Number of Nails: 10 -Examined patient. -No new findings. No new orders. -Toenails 1-5 b/l were debrided in length and girth with sterile nail nippers and dremel without iatrogenic bleeding.  -Callus(es) submet head 1 left foot pared utilizing sterile scalpel blade without complication or incident. Total number debrided =1. -Patient to report any pedal injuries to medical professional immediately. -Patient to continue soft, supportive shoe gear daily. -Patient/POA to call should there be question/concern in the interim.  Return in about 3 months (around 03/18/2021).  Freddie Breech, DPM

## 2020-12-30 ENCOUNTER — Telehealth: Payer: Self-pay | Admitting: Family Medicine

## 2020-12-30 NOTE — Telephone Encounter (Signed)
Pts friend calling back stated there was a message on pts phone to call out office back. Please advise.

## 2021-01-02 NOTE — Telephone Encounter (Signed)
Unable to reach patient or leave a msg. But if patient friend return call please let them know .Unsure who may of called on Sun our office was closed at that time. I do not see a msg in patient's chart.

## 2021-01-03 ENCOUNTER — Ambulatory Visit: Payer: Medicare Other | Attending: Neurology | Admitting: Physical Therapy

## 2021-01-03 ENCOUNTER — Other Ambulatory Visit: Payer: Self-pay

## 2021-01-03 ENCOUNTER — Encounter: Payer: Self-pay | Admitting: Physical Therapy

## 2021-01-03 DIAGNOSIS — R293 Abnormal posture: Secondary | ICD-10-CM | POA: Insufficient documentation

## 2021-01-03 DIAGNOSIS — R29818 Other symptoms and signs involving the nervous system: Secondary | ICD-10-CM | POA: Diagnosis not present

## 2021-01-03 DIAGNOSIS — R2681 Unsteadiness on feet: Secondary | ICD-10-CM | POA: Diagnosis not present

## 2021-01-03 DIAGNOSIS — R2689 Other abnormalities of gait and mobility: Secondary | ICD-10-CM | POA: Diagnosis not present

## 2021-01-03 DIAGNOSIS — R262 Difficulty in walking, not elsewhere classified: Secondary | ICD-10-CM | POA: Insufficient documentation

## 2021-01-03 DIAGNOSIS — M6281 Muscle weakness (generalized): Secondary | ICD-10-CM | POA: Diagnosis not present

## 2021-01-03 NOTE — Therapy (Addendum)
[facility] [address] [place], [place], [address] Phone: [phone]   Fax:  [phone]  Physical Therapy Evaluation  Patient Details  Name: Casey Fisher MRN: [id number] Date of Birth: 09/19/1928 Referring Provider (PT): Stephanie Acre, MD   Encounter Date: 01/03/2021   PT End of Session - 01/03/21 1549    Visit Number 1    Number of Visits 17    Date for PT Re-Evaluation 04/03/21   written for 60 day cert   Authorization Type Medicare - part A and B    Progress Note Due on Visit 10    PT Start Time 1440    PT Stop Time 1530    PT Time Calculation (min) 50 min    Equipment Utilized During Treatment Gait belt    Activity Tolerance Patient tolerated treatment well    Behavior During Therapy [facility] for tasks assessed/performed           Past Medical History:  Diagnosis Date  . Abnormality of gait 03/24/2016  . Anxiety   . Cancer (HCC)   . Cataract   . Depression   . Heart murmur   . Hereditary and idiopathic peripheral neuropathy 03/24/2016  . Hypertension   . Memory difficulty 03/24/2016  . Neuromuscular disorder (HCC)   . Neuropathy   . Prostate cancer (HCC)    remission    Past Surgical History:  Procedure Laterality Date  . CATARACT EXTRACTION Bilateral   . EYE SURGERY    . HERNIA REPAIR  1985  . INGUINAL HERNIA REPAIR Right 12/06/2013   Procedure: HERNIA REPAIR INGUINAL ADULT;  Surgeon: Robyne Askew, MD;  [facility];  Service: General;  Laterality: Right;  . INSERTION OF MESH Right 12/06/2013   Procedure: INSERTION OF MESH;  Surgeon: Robyne Askew, MD;  Location: [facility];  Service: General;  Laterality: Right;  . PROSTATE SURGERY      There were no vitals filed for this visit.    Subjective Assessment - 01/03/21 1447    Subjective Has been walking with a rollator for about a year. Has been walking with incr B knee flexion for about 2 years and has gotten worse. No falls - last  fall was about a year ago. Pt's friend Leonette Most, helps to supplement hx. Got home PT earlier this year, about 6 weeks and did well. Started regressing after PT stopped. Lives by himself - either friend Leonette Most or pt's neighbor comes over to help with medication management. Reports that he does not go out of the house much, just to go to doctor's appts. Reports it was a lot getting into session today.    Patient is accompained by: --   friend, [name]   Pertinent History PMH: peripheral neuropathy, parkinsonism, HTN, prostate cancer, chronic gait disorder    Limitations Walking;Standing    How long can you walk comfortably? not very long - approx. 30' back into clinic from waiting room    Patient Stated Goals just wants to be able to walk    Currently in Pain? No/denies              [facility] PT Assessment - 01/03/21 1456      Assessment   Medical Diagnosis parkinsonism/peripheral neuropathy with gait abnormality    Referring Provider (PT) Stephanie Acre, MD    Onset Date/Surgical Date 12/05/20   noticed difficulties with walking 2 years ago   Hand Dominance Right    Prior Therapy  previous HHPT earlier this year      Precautions   Precautions Fall      Balance Screen   Has the patient fallen in the past 6 months No   almost falls sometimes when rollator gets too far in front of him   Has the patient had a decrease in activity level because of a fear of falling?  Yes    Is the patient reluctant to leave their home because of a fear of falling?  Yes   if he has someone with him, he is ok     Engineer, agricultural residence    Living Arrangements Alone    Available Help at Discharge Friend(s);Personal care attendant;Other (Comment)   charles and another friend checks on him, they are over everyday   Type of Home House    Home Access Stairs to enter    Entrance Stairs-Number of Steps 1    Entrance Stairs-Rails None   holds onto the walls and does it with  neighbor/friend   Home Layout One level    Home Equipment Walker - 4 wheels;Walker - 2 wheels;Shower seat;Grab bars - toilet;Grab bars - tub/shower;Other (comment)   has a tub shower   Additional Comments pt is currently bathing and getting dressed by himself      Prior Function   Level of Independence Independent with household mobility with device    Leisure likes golf      Cognition   Overall Cognitive Status --   pt's friend reports difficulty with long term memory     Sensation   Light Touch Appears Intact    Proprioception Appears Intact   at B ankles     Coordination   Gross Motor Movements are Fluid and Coordinated No    Coordination and Movement Description unable to perform B due to weakness      Posture/Postural Control   Posture/Postural Control Postural limitations    Postural Limitations Forward head;Rounded Shoulders;Increased thoracic kyphosis;Flexed trunk    Posture Comments incr B knee flexion and forward flexed posture in standing      ROM / Strength   AROM / PROM / Strength PROM;Strength;AROM      AROM   Overall AROM Comments in sitting with LAQ: lacking 40 deg knee extension on R and L      PROM   Overall PROM Comments incr hypomobility at ankle R>L      Strength   Strength Assessment Site Hip;Knee;Ankle    Right/Left Hip Right;Left    Right Hip Flexion 3/5    Left Hip Flexion 4-/5    Right/Left Knee Right;Left    Right Knee Flexion 4-/5    Right Knee Extension 3+/5    Left Knee Flexion 4-/5    Left Knee Extension 4-/5    Right/Left Ankle Right;Left    Right Ankle Dorsiflexion 4-/5    Left Ankle Dorsiflexion 4/5      Transfers   Transfers Sit to Stand;Stand to Sit    Sit to Stand 4: Min guard;4: Min assist    Sit to Stand Details Visual cues for safe use of DME/AE;Verbal cues for sequencing;Verbal cues for technique    Sit to Stand Details (indicate cue type and reason) in standing pt with decr knee flexion and forward flexed posture    Five  time sit to stand comments  1 minute and 24 seconds    Stand to Sit 4: Min guard;4: Min assist  Stand to Sit Details (indicate cue type and reason) Visual cues for safe use of DME/AE;Visual cues/gestures for precautions/safety;Visual cues/gestures for sequencing;Verbal cues for sequencing;Verbal cues for technique    Stand to Sit Details cues to reach back with one UE for chair, during 5x sit <> Stand pt with BUE on rollator and poor eccentric control, therapist needing to steady rollator    Transfer Cueing with turning with rollator to sit down in chair - needing verbal and manual cues to stay close to rollator as well as to fully back up to the chair prior to sitting down. pt needs cues to reach back with one hand to sit down      Ambulation/Gait   Ambulation/Gait Yes    Ambulation/Gait Assistance 4: Min guard;4: Min assist    Ambulation/Gait Assistance Details needing cues to stay close to rollator as pt with tendency to push too far anteriorly, pt with incr B knee flexion and forward flexed posture during gait    Ambulation Distance (Feet) 30 Feet   x2 -into and out of clinic   Assistive device Other (Comment)   rollator   Gait Pattern Step-to pattern;Step-through pattern;Decreased step length - right;Decreased step length - left;Decreased dorsiflexion - right;Decreased dorsiflexion - left;Right foot flat;Left foot flat;Right flexed knee in stance;Left flexed knee in stance;Left genu recurvatum;Trunk flexed;Poor foot clearance - left;Poor foot clearance - right    Ambulation Surface Level;Indoor    Gait velocity 17.15 seconds (in 10 ft) = .58 ft/sec                      Objective measurements completed on examination: See above findings.               PT Education - 01/03/21 1549    Education Details clinical findings, POC    Person(s) Educated Patient   pt's friend Leonette Most   Methods Explanation    Comprehension Verbalized understanding               PT  Short Term Goals - 01/03/21 1551      PT SHORT TERM GOAL #1   Title Pt and caregiver will be independent with initial HEP in order to build upon functional gains made in therapy. ALL STGS DUE 01/31/21    Time 4    Period Weeks    Status New    Target Date 01/31/21      PT SHORT TERM GOAL #2   Title Pt will undergo further assessment of TUG - STG and LTG written as appropriate.    Time 4    Period Weeks    Status New      PT SHORT TERM GOAL #3   Title Pt will incr gait speed with rollator to at least .8 ft/sec in order to demo improved household mobility.    Baseline .58 ft/sec    Time 4    Period Weeks    Status New      PT SHORT TERM GOAL #4   Title Pt will be able to ambulate 26' with supervision with rollator in order to demo improved household mobility.    Time 4    Period Weeks    Status New      PT SHORT TERM GOAL #5   Title Pt will decr 5x sit <> stand time to 1 minute and 15 seconds or less in order to demo improved transfer efficiency and balance.    Baseline 1 minute and 24 seconds  from standard height chair.    Time 4    Period Weeks    Status New           PT Long Term Goals - 01/03/21 1554      PT LONG TERM GOAL #1   Title Pt and caregiver will be independent with final HEP in order to build upon functional gains made in therapy. ALL LTGS DUE 02/28/21    Time 8    Period Weeks    Status New    Target Date 02/28/21      PT LONG TERM GOAL #2   Title TUG goal to be written as appropriate - not yet assessed.    Time 8    Period Weeks    Status New      PT LONG TERM GOAL #3   Title Pt will decr 5x sit <> stand time to 65 seconds or less in order to demo improved transfer efficiency and balance.    Baseline 1 minute and 24 seconds from standard height chair.    Time 8    Period Weeks    Status New      PT LONG TERM GOAL #4   Title Pt will incr gait speed with rollator to at least 1.0 ft/sec in order to demo improved household mobility.    Baseline .58  ft/sec    Time 8    Period Weeks    Status New      PT LONG TERM GOAL #5   Title Pt will be able to ambulate 100' with supervision with rollator vs. RW in order to demo improved household mobility.    Time 8    Period Weeks    Status New                  01/03/21 1556  Plan  Clinical Impression Statement Patient is a 87 year old male referred to Neuro OPPT for gait abnormality/imbalance. Pt's PMH is significant for: peripheral neuropathy, parkinsonism, HTN, prostate cancer. Pt currently ambulating with a rollator with incr forward flexed posture and B knee flexion with pt having a tendency to push rollator too far anteriorly, needing close min guard for gait.  Pt also ambulates with a shuffled gait pattern with decr foot clearance B. The following deficits were present during the exam: decr BLE strength, impaired coordination, postural abnormalities, decr endurance, gait abnormalities, decr safety awareness, decr AROM (more notably B knee extension). Based on gait speed and 5x sit <> stand pt is at a high risk for falls. Pt would benefit from skilled PT to address these impairments and functional limitations to maximize functional mobility independence  Personal Factors and Comorbidities Age;Comorbidity 3+;Time since onset of injury/illness/exacerbation;Past/Current Experience  Comorbidities peripheral neuropathy, parkinsonism, HTN, prostate cancer  Examination-Activity Limitations Locomotion Level;Sit;Stand;Transfers  Examination-Participation Restrictions Cleaning;Community Activity;Medication Management;Meal Prep (pt gets meals on wheels)  Pt will benefit from skilled therapeutic intervention in order to improve on the following deficits Abnormal gait;Decreased balance;Decreased activity tolerance;Decreased coordination;Decreased endurance;Decreased knowledge of use of DME;Decreased mobility;Decreased safety awareness;Decreased range of motion;Difficulty walking;Decreased  strength;Hypomobility;Impaired flexibility;Postural dysfunction  Stability/Clinical Decision Making Evolving/Moderate complexity  Clinical Decision Making Moderate  Rehab Potential Good  PT Frequency 2x / week  PT Duration 8 weeks  PT Treatment/Interventions ADLs/Self Care Home Management;DME Instruction;Functional mobility training;Therapeutic activities;Gait training;Therapeutic exercise;Balance training;Neuromuscular re-education;Patient/family education;Passive range of motion;Energy conservation;Vestibular  PT Next Visit Plan pt will need to be wheeled back into clinic in w/c to save time. initial seated  and supine HEP for BLE strength/ROM, sit <> stand training, perform TUG when able.  Consulted and Agree with Plan of Care Patient;Family member/caregiver         Patient will benefit from skilled therapeutic intervention in order to improve the following deficits and impairments:     Visit Diagnosis: Unsteadiness on feet  Muscle weakness (generalized)  Abnormal posture  Other abnormalities of gait and mobility  Other symptoms and signs involving the nervous system  Difficulty in walking, not elsewhere classified     Problem List Patient Active Problem List   Diagnosis Date Noted  . Primary osteoarthritis of right knee 05/23/2020  . Low back pain 08/03/2019  . Acute pain of left shoulder 10/10/2018  . Chest pain 11/08/2017  . Memory difficulty 03/24/2016  . Hereditary and idiopathic peripheral neuropathy 03/24/2016  . Abnormality of gait 03/24/2016  . Peripheral neuropathy 04/18/2015  . Hyperlipidemia 04/18/2015  . Prostate cancer (HCC) 08/24/2014  . Right inguinal hernia 10/30/2013    Drake Leach, PT, DPT  01/03/2021, 3:51 PM   [facility] [address] [place], [place], [address] Phone: [phone]   Fax:  [phone]  Name: Casey Fisher MRN: [id number] Date of Birth: 15-May-1928

## 2021-01-06 ENCOUNTER — Encounter: Payer: Self-pay | Admitting: Physical Therapy

## 2021-01-06 ENCOUNTER — Ambulatory Visit: Payer: Medicare Other | Admitting: Physical Therapy

## 2021-01-06 ENCOUNTER — Other Ambulatory Visit: Payer: Self-pay

## 2021-01-06 DIAGNOSIS — R2681 Unsteadiness on feet: Secondary | ICD-10-CM

## 2021-01-06 DIAGNOSIS — R2689 Other abnormalities of gait and mobility: Secondary | ICD-10-CM | POA: Diagnosis not present

## 2021-01-06 DIAGNOSIS — M6281 Muscle weakness (generalized): Secondary | ICD-10-CM | POA: Diagnosis not present

## 2021-01-06 DIAGNOSIS — R293 Abnormal posture: Secondary | ICD-10-CM

## 2021-01-06 DIAGNOSIS — R29818 Other symptoms and signs involving the nervous system: Secondary | ICD-10-CM | POA: Diagnosis not present

## 2021-01-06 DIAGNOSIS — R262 Difficulty in walking, not elsewhere classified: Secondary | ICD-10-CM | POA: Diagnosis not present

## 2021-01-06 NOTE — Patient Instructions (Signed)
Access Code: [email or web address] URL: [email or web address] Date: 01/06/2021 Prepared by: Janann August  Exercises Seated Heel Toe Raises - 1 x daily - 5 x weekly - 2 sets - 10 reps Seated March - 2 x daily - 5 x weekly - 2 sets - 10 reps Seated Long Arc Quad - 2 x daily - 5 x weekly - 2 sets - 10 reps Seated Active Hip Flexion - 2 x daily - 5 x weekly - 2 sets - 10 reps Supine Quadricep Sets - 2 x daily - 5 x weekly - 2 sets - 10 reps - 3 hold

## 2021-01-06 NOTE — Therapy (Signed)
[facility] [address] [place], [place], [address] Phone: [phone]   Fax:  [phone]  Physical Therapy Treatment  Patient Details  Name: Casey Fisher MRN: [id number] Date of Birth: Dec 07, 1928 Referring Provider (PT): Margette Fast, MD   Encounter Date: 01/06/2021   PT End of Session - 01/06/21 1405    Visit Number 2    Number of Visits 17    Date for PT Re-Evaluation 04/03/21   written for 60 day cert   Authorization Type Medicare - part A and B    Progress Note Due on Visit 10    PT Start Time 1319    PT Stop Time 1401    PT Time Calculation (min) 42 min    Equipment Utilized During Treatment Gait belt    Activity Tolerance Patient tolerated treatment well    Behavior During Therapy WFL for tasks assessed/performed           Past Medical History:  Diagnosis Date  . Abnormality of gait 03/24/2016  . Anxiety   . Cancer ([place])   . Cataract   . Depression   . Heart murmur   . Hereditary and idiopathic peripheral neuropathy 03/24/2016  . Hypertension   . Memory difficulty 03/24/2016  . Neuromuscular disorder ([place])   . Neuropathy   . Prostate cancer ([place])    remission    Past Surgical History:  Procedure Laterality Date  . CATARACT EXTRACTION Bilateral   . EYE SURGERY    . HERNIA REPAIR  1985  . INGUINAL HERNIA REPAIR Right 12/06/2013   Procedure: HERNIA REPAIR INGUINAL ADULT;  Surgeon: Merrie Roof, MD;  Location: [facility];  Service: General;  Laterality: Right;  . INSERTION OF MESH Right 12/06/2013   Procedure: INSERTION OF MESH;  Surgeon: Merrie Roof, MD;  Location: [facility];  Service: General;  Laterality: Right;  . PROSTATE SURGERY      There were no vitals filed for this visit.   Subjective Assessment - 01/06/21 1322    Subjective No changes since he has been here. Comes into clinic today in clinic wheelchair.    Patient is accompained by: --   friend, Casey Fisher   Pertinent History  PMH: peripheral neuropathy, parkinsonism, HTN, prostate cancer, chronic gait disorder    Limitations Walking;Standing    How long can you walk comfortably? not very long - approx. 30' back into clinic from waiting room    Patient Stated Goals just wants to be able to walk    Currently in Pain? No/denies                             [facility] Adult PT Treatment/Exercise - 01/06/21 0001      Transfers   Transfers Sit to Stand;Stand to Sit;Stand Pivot Transfers    Sit to Stand 4: Min guard    Sit to Stand Details Visual cues for safe use of DME/AE;Verbal cues for sequencing;Verbal cues for technique    Sit to Stand Details (indicate cue type and reason) x5 reps performed throughout session, cues for proper UE placement, in standing cues for knee and trunk extension as pt with incr forward flexed posture and hip ADD    Stand to Sit 4: Min guard    Stand to Sit Details (indicate cue type and reason) Visual cues for safe use of DME/AE;Visual cues/gestures for precautions/safety;Visual cues/gestures for sequencing;Verbal cues for sequencing;Verbal cues for  technique    Stand to Sit Details cues to reach back towards mat table for controlled descent    Stand Pivot Transfers 4: Min guard   use of RW, takes incr time while turning          Access Code: [email or web address] URL: [email or web address] Date: 01/06/2021 Prepared by: Janann August  Exercises Seated Heel Toe Raises - 1 x daily - 5 x weekly - 2 sets - 10 reps Seated March - 2 x daily - 5 x weekly - 2 sets - 10 reps - use of boomwhacker as visual cue for incr ROM  Seated Long Arc Quad - 2 x daily - 5 x weekly - 2 sets - 10 reps - use of boomwhacker as visual cue for incr ROM, cues to hold for 3 seconds  Seated Active Hip Flexion - 2 x daily - 5 x weekly - 2 sets - 10 reps - holding in position for approx. 5 seconds for additional stretch and cues for scapular retraction when coming back to sitting  Supine Quadricep  Sets - 2 x daily - 5 x weekly - 2 sets - 10 reps - 3 hold - supine on mat table, pt needing verbal and tactile cues for proper technique          PT Education - 01/06/21 1404    Education Details initial HEP, sit <> stand technique    Person(s) Educated Patient   pt's friend Journalist, newspaper   Methods Explanation;Demonstration;Verbal cues;Tactile cues;Handout    Comprehension Verbalized understanding;Returned demonstration;Verbal cues required            PT Short Term Goals - 01/03/21 1551      PT SHORT TERM GOAL #1   Title Pt and caregiver will be independent with initial HEP in order to build upon functional gains made in therapy. ALL STGS DUE 01/31/21    Time 4    Period Weeks    Status New    Target Date 01/31/21      PT SHORT TERM GOAL #2   Title Pt will undergo further assessment of TUG - STG and LTG written as appropriate.    Time 4    Period Weeks    Status New      PT SHORT TERM GOAL #3   Title Pt will incr gait speed with rollator to at least .8 ft/sec in order to demo improved household mobility.    Baseline .69 ft/sec    Time 4    Period Weeks    Status New      PT SHORT TERM GOAL #4   Title Pt will be able to ambulate 10' with supervision with rollator in order to demo improved household mobility.    Time 4    Period Weeks    Status New      PT SHORT TERM GOAL #5   Title Pt will decr 5x sit <> stand time to 1 minute and 15 seconds or less in order to demo improved transfer efficiency and balance.    Baseline 1 minute and 24 seconds from standard height chair.    Time 4    Period Weeks    Status New             PT Long Term Goals - 01/03/21 1554      PT LONG TERM GOAL #1   Title Pt and caregiver will be independent with final HEP in order to build upon functional gains made in therapy. ALL LTGS  DUE 02/28/21    Time 8    Period Weeks    Status New    Target Date 02/28/21      PT LONG TERM GOAL #2   Title TUG goal to be written as appropriate - not yet  assessed.    Time 8    Period Weeks    Status New      PT LONG TERM GOAL #3   Title Pt will decr 5x sit <> stand time to 65 seconds or less in order to demo improved transfer efficiency and balance.    Baseline 1 minute and 24 seconds from standard height chair.    Time 8    Period Weeks    Status New      PT LONG TERM GOAL #4   Title Pt will incr gait speed with rollator to at least 1.0 ft/sec in order to demo improved household mobility.    Baseline .58 ft/sec    Time 8    Period Weeks    Status New      PT LONG TERM GOAL #5   Title Pt will be able to ambulate 100' with supervision with rollator vs. RW in order to demo improved household mobility.    Time 8    Period Weeks    Status New                 Plan - 01/06/21 1406    Clinical Impression Statement Focus of today's skilled session was education on sit <> stand transfers and initial HEP for BLE strengthening and supine for stretching of hip flexors/hamstrings. Pt tolerated session well, did well with visual cues for technique and incr ROM with LAQs and marching. Pt needing initial cues for proper UE placement for sit <> stands for safety and incr forward weight shift prior to standing. Will continue to progress towards LTGs.    Personal Factors and Comorbidities Age;Comorbidity 3+;Time since onset of injury/illness/exacerbation;Past/Current Experience    Comorbidities peripheral neuropathy, parkinsonism, HTN, prostate cancer    Examination-Activity Limitations Locomotion Level;Sit;Stand;Transfers    Examination-Participation Restrictions Cleaning;Community Activity;Medication Management;Meal Prep   pt gets meals on wheels   Stability/Clinical Decision Making Evolving/Moderate complexity    Rehab Potential Good    PT Frequency 2x / week    PT Duration 8 weeks    PT Treatment/Interventions ADLs/Self Care Home Management;DME Instruction;Functional mobility training;Therapeutic activities;Gait training;Therapeutic  exercise;Balance training;Neuromuscular re-education;Patient/family education;Passive range of motion;Energy conservation;Vestibular    PT Next Visit Plan pt will need to be wheeled back into clinic in w/c to save time.how is HEP? BLE strengthening/hamstring stretching, and posture work. sit <> stand training, perform TUG when able (either rollator or RW), scifit would be good.    Consulted and Agree with Plan of Care Patient;Family member/caregiver    Family Member Consulted pt's friend Journalist, newspaper           Patient will benefit from skilled therapeutic intervention in order to improve the following deficits and impairments:  Abnormal gait,Decreased balance,Decreased activity tolerance,Decreased coordination,Decreased endurance,Decreased knowledge of use of DME,Decreased mobility,Decreased safety awareness,Decreased range of motion,Difficulty walking,Decreased strength,Hypomobility,Impaired flexibility,Postural dysfunction  Visit Diagnosis: Unsteadiness on feet  Muscle weakness (generalized)  Abnormal posture  Other abnormalities of gait and mobility     Problem List Patient Active Problem List   Diagnosis Date Noted  . Primary osteoarthritis of right knee 05/23/2020  . Low back pain 08/03/2019  . Acute pain of left shoulder 10/10/2018  . Chest pain 11/08/2017  .  Memory difficulty 03/24/2016  . Hereditary and idiopathic peripheral neuropathy 03/24/2016  . Abnormality of gait 03/24/2016  . Peripheral neuropathy 04/18/2015  . Hyperlipidemia 04/18/2015  . Prostate cancer ([facility]) 08/24/2014  . Right inguinal hernia 10/30/2013    Arliss Journey, PT, DPT  01/06/2021, 2:11 PM  [facility] [address] [place], [place], [address] Phone: [phone]   Fax:  [phone]  Name: Casey Fisher MRN: [id number] Date of Birth: September 11, 1928

## 2021-01-09 ENCOUNTER — Ambulatory Visit: Payer: Medicare Other | Admitting: Physical Therapy

## 2021-01-10 ENCOUNTER — Ambulatory Visit: Payer: Medicare Other | Admitting: Physical Therapy

## 2021-01-10 ENCOUNTER — Other Ambulatory Visit: Payer: Self-pay

## 2021-01-10 ENCOUNTER — Encounter: Payer: Self-pay | Admitting: Physical Therapy

## 2021-01-10 DIAGNOSIS — M6281 Muscle weakness (generalized): Secondary | ICD-10-CM | POA: Diagnosis not present

## 2021-01-10 DIAGNOSIS — R2689 Other abnormalities of gait and mobility: Secondary | ICD-10-CM | POA: Diagnosis not present

## 2021-01-10 DIAGNOSIS — R29818 Other symptoms and signs involving the nervous system: Secondary | ICD-10-CM | POA: Diagnosis not present

## 2021-01-10 DIAGNOSIS — R293 Abnormal posture: Secondary | ICD-10-CM | POA: Diagnosis not present

## 2021-01-10 DIAGNOSIS — R2681 Unsteadiness on feet: Secondary | ICD-10-CM | POA: Diagnosis not present

## 2021-01-10 DIAGNOSIS — R262 Difficulty in walking, not elsewhere classified: Secondary | ICD-10-CM | POA: Diagnosis not present

## 2021-01-10 NOTE — Therapy (Signed)
[facility] [address] [place], [place], [address] Phone: [phone]   Fax:  [phone]  Physical Therapy Treatment  Patient Details  Name: Casey Fisher MRN: [id number] Date of Birth: 07-01-1928 Referring Provider (PT): Margette Fast, MD   Encounter Date: 01/10/2021   PT End of Session - 01/10/21 0949    Visit Number 3    Number of Visits 17    Date for PT Re-Evaluation 04/03/21   written for 60 day cert   Authorization Type Medicare - part A and B    Progress Note Due on Visit 10    PT Start Time [phone]    PT Stop Time 0930    PT Time Calculation (min) 46 min    Equipment Utilized During Treatment Gait belt    Activity Tolerance Patient tolerated treatment well    Behavior During Therapy [facility] for tasks assessed/performed           Past Medical History:  Diagnosis Date  . Abnormality of gait 03/24/2016  . Anxiety   . Cancer ([facility])   . Cataract   . Depression   . Heart murmur   . Hereditary and idiopathic peripheral neuropathy 03/24/2016  . Hypertension   . Memory difficulty 03/24/2016  . Neuromuscular disorder ([facility])   . Neuropathy   . Prostate cancer ([facility])    remission    Past Surgical History:  Procedure Laterality Date  . CATARACT EXTRACTION Bilateral   . EYE SURGERY    . HERNIA REPAIR  1985  . INGUINAL HERNIA REPAIR Right 12/06/2013   Procedure: HERNIA REPAIR INGUINAL ADULT;  Surgeon: Merrie Roof, MD;  Location: [facility];  Service: General;  Laterality: Right;  . INSERTION OF MESH Right 12/06/2013   Procedure: INSERTION OF MESH;  Surgeon: Merrie Roof, MD;  Location: [facility];  Service: General;  Laterality: Right;  . PROSTATE SURGERY      There were no vitals filed for this visit.   Subjective Assessment - 01/10/21 0853    Subjective Walks in today with rollator. No changes since he was last here. Has been doing the heel toe exercises.    Patient is accompained by: --   friend,  [name]   Pertinent History PMH: peripheral neuropathy, parkinsonism, HTN, prostate cancer, chronic gait disorder    Limitations Walking;Standing    How long can you walk comfortably? not very long - approx. 30' back into clinic from waiting room    Patient Stated Goals just wants to be able to walk    Currently in Pain? No/denies                             [facility] Adult PT Treatment/Exercise - 01/10/21 0001      Transfers   Transfers Sit to Stand;Stand to Sit;Stand Pivot Transfers    Sit to Stand 4: Min guard    Sit to Stand Details Visual cues for safe use of DME/AE;Verbal cues for sequencing;Verbal cues for technique    Sit to Stand Details (indicate cue type and reason) cues for anterior weight shift    Stand to Sit 4: Min guard    Stand to Sit Details (indicate cue type and reason) Visual cues for safe use of DME/AE;Visual cues/gestures for precautions/safety;Visual cues/gestures for sequencing;Verbal cues for sequencing;Verbal cues for technique    Stand Pivot Transfers 4: Min assist    Stand Pivot Transfer Details (indicate  cue type and reason) with rollator, cues to stay close to rollator when turning, takes incr time      Ambulation/Gait   Ambulation/Gait Yes    Ambulation/Gait Assistance 4: Min assist    Ambulation/Gait Assistance Details cues to stay close to rollator throughout gait and for tall posture, pt with incr B knee flexion and forward flexed posture. pt with shuffled gait pattern    Ambulation Distance (Feet) 100 Feet   x2   Assistive device Rollator    Gait Pattern Step-to pattern;Step-through pattern;Decreased step length - right;Decreased step length - left;Decreased dorsiflexion - right;Decreased dorsiflexion - left;Right foot flat;Left foot flat;Right flexed knee in stance;Left flexed knee in stance;Left genu recurvatum;Trunk flexed;Poor foot clearance - left;Poor foot clearance - right    Ambulation Surface Level;Indoor      Neuro Re-ed     Neuro Re-ed Details  Modified PWR Up! Seated: hip flexion and sitting up nice and tall with scapular retraction 2 x 10 reps, verbal and demo cues for proper technique, modified PWR Step Seated: stepping over and out over a boomwhacker for incr foot clearance and step height x10 reps B - verbal and demo cues for proper technique and posture. Modified PWR Rock in seated: shifting weight from hips R/L x10 reps B - verbal and demo cues for proper technique and posture.      Exercises   Exercises Other Exercises    Other Exercises  Seated marching 2 x 10 reps B with cues for posture and use of boomwhacker as visual cue on how high to lift leg - educated pt's friend to use visual cue at home for incr intensity of movement.  2 x 10 reps LAQs with visual cue of boomwhacker on how high to lift up leg.  Seated hamstring stretch 3 x 30 seconds B with verbal and demo cues for proper technique with foot propped up on a block - added to HEP.                  PT Education - 01/10/21 0948    Education Details importance of performing HEP, hamstring stretch addition    Person(s) Educated Patient;Caregiver(s)    Methods Explanation;Demonstration;Verbal cues;Handout    Comprehension Verbalized understanding;Returned demonstration            PT Short Term Goals - 01/03/21 1551      PT SHORT TERM GOAL #1   Title Pt and caregiver will be independent with initial HEP in order to build upon functional gains made in therapy. ALL STGS DUE 01/31/21    Time 4    Period Weeks    Status New    Target Date 01/31/21      PT SHORT TERM GOAL #2   Title Pt will undergo further assessment of TUG - STG and LTG written as appropriate.    Time 4    Period Weeks    Status New      PT SHORT TERM GOAL #3   Title Pt will incr gait speed with rollator to at least .8 ft/sec in order to demo improved household mobility.    Baseline .6 ft/sec    Time 4    Period Weeks    Status New      PT SHORT TERM GOAL #4    Title Pt will be able to ambulate 58' with supervision with rollator in order to demo improved household mobility.    Time 4    Period Weeks  Status New      PT SHORT TERM GOAL #5   Title Pt will decr 5x sit <> stand time to 1 minute and 15 seconds or less in order to demo improved transfer efficiency and balance.    Baseline 1 minute and 24 seconds from standard height chair.    Time 4    Period Weeks    Status New             PT Long Term Goals - 01/03/21 1554      PT LONG TERM GOAL #1   Title Pt and caregiver will be independent with final HEP in order to build upon functional gains made in therapy. ALL LTGS DUE 02/28/21    Time 8    Period Weeks    Status New    Target Date 02/28/21      PT LONG TERM GOAL #2   Title TUG goal to be written as appropriate - not yet assessed.    Time 8    Period Weeks    Status New      PT LONG TERM GOAL #3   Title Pt will decr 5x sit <> stand time to 65 seconds or less in order to demo improved transfer efficiency and balance.    Baseline 1 minute and 24 seconds from standard height chair.    Time 8    Period Weeks    Status New      PT LONG TERM GOAL #4   Title Pt will incr gait speed with rollator to at least 1.0 ft/sec in order to demo improved household mobility.    Baseline .58 ft/sec    Time 8    Period Weeks    Status New      PT LONG TERM GOAL #5   Title Pt will be able to ambulate 100' with supervision with rollator vs. RW in order to demo improved household mobility.    Time 8    Period Weeks    Status New                 Plan - 01/10/21 0957    Clinical Impression Statement Pt ambulated back into clinic with rollator and back into waiting room at end of session (approx. 2 x 100'). Pt needing min guard/min A for gait, pt needing verbal cues to stay close to rollator as pt with tendency to push it too far anteriorly. Intermittent times to stop and reset with gait before continuing. Pt with incr B knee flexion  and forward flexed posture. Added seated hamstring stretch to pt's HEP today. Will continue to progress towards LTGs.    Personal Factors and Comorbidities Age;Comorbidity 3+;Time since onset of injury/illness/exacerbation;Past/Current Experience    Comorbidities peripheral neuropathy, parkinsonism, HTN, prostate cancer    Examination-Activity Limitations Locomotion Level;Sit;Stand;Transfers    Examination-Participation Restrictions Cleaning;Community Activity;Medication Management;Meal Prep   pt gets meals on wheels   Stability/Clinical Decision Making Evolving/Moderate complexity    Rehab Potential Good    PT Frequency 2x / week    PT Duration 8 weeks    PT Treatment/Interventions ADLs/Self Care Home Management;DME Instruction;Functional mobility training;Therapeutic activities;Gait training;Therapeutic exercise;Balance training;Neuromuscular re-education;Patient/family education;Passive range of motion;Energy conservation;Vestibular    PT Next Visit Plan pt might need to be wheeled back into clinic in w/c to save time.how is HEP? BLE strengthening/hamstring stretching, and posture work. sit <> stand training, perform TUG when able (either rollator or RW), scifit would be good. modified seated PWR.  Consulted and Agree with Plan of Care Patient;Family member/caregiver    Family Member Consulted pt's friend Journalist, newspaper           Patient will benefit from skilled therapeutic intervention in order to improve the following deficits and impairments:  Abnormal gait,Decreased balance,Decreased activity tolerance,Decreased coordination,Decreased endurance,Decreased knowledge of use of DME,Decreased mobility,Decreased safety awareness,Decreased range of motion,Difficulty walking,Decreased strength,Hypomobility,Impaired flexibility,Postural dysfunction  Visit Diagnosis: Unsteadiness on feet  Muscle weakness (generalized)  Abnormal posture  Other abnormalities of gait and mobility     Problem  List Patient Active Problem List   Diagnosis Date Noted  . Primary osteoarthritis of right knee 05/23/2020  . Low back pain 08/03/2019  . Acute pain of left shoulder 10/10/2018  . Chest pain 11/08/2017  . Memory difficulty 03/24/2016  . Hereditary and idiopathic peripheral neuropathy 03/24/2016  . Abnormality of gait 03/24/2016  . Peripheral neuropathy 04/18/2015  . Hyperlipidemia 04/18/2015  . Prostate cancer ([place]) 08/24/2014  . Right inguinal hernia 10/30/2013    Arliss Journey, PT, DPT  01/10/2021, 9:59 AM  [facility] [address] [place], [place], [address] Phone: [phone]   Fax:  [phone]  Name: ALASDAIR WALICKI MRN: [id number] Date of Birth: 03-09-1928

## 2021-01-10 NOTE — Patient Instructions (Signed)
Access Code: [email or web address] URL: [email or web address] Date: 01/10/2021 Prepared by: Janann August  Exercises Seated Heel Toe Raises - 1 x daily - 5 x weekly - 2 sets - 10 reps Seated March - 2 x daily - 5 x weekly - 2 sets - 10 reps Seated Long Arc Quad - 2 x daily - 5 x weekly - 2 sets - 10 reps Seated Active Hip Flexion - 2 x daily - 5 x weekly - 2 sets - 10 reps Supine Quadricep Sets - 2 x daily - 5 x weekly - 2 sets - 10 reps - 3 hold Seated Hamstring Stretch - 2 x daily - 5 x weekly - 3 sets - 30 hold

## 2021-01-13 ENCOUNTER — Ambulatory Visit: Payer: Medicare Other | Admitting: Physical Therapy

## 2021-01-14 ENCOUNTER — Ambulatory Visit: Payer: Medicare Other | Admitting: Physical Therapy

## 2021-01-17 ENCOUNTER — Ambulatory Visit: Payer: Medicare Other | Admitting: Physical Therapy

## 2021-01-17 ENCOUNTER — Other Ambulatory Visit: Payer: Self-pay

## 2021-01-17 DIAGNOSIS — R2689 Other abnormalities of gait and mobility: Secondary | ICD-10-CM

## 2021-01-17 DIAGNOSIS — R262 Difficulty in walking, not elsewhere classified: Secondary | ICD-10-CM | POA: Diagnosis not present

## 2021-01-17 DIAGNOSIS — M6281 Muscle weakness (generalized): Secondary | ICD-10-CM | POA: Diagnosis not present

## 2021-01-17 DIAGNOSIS — R293 Abnormal posture: Secondary | ICD-10-CM | POA: Diagnosis not present

## 2021-01-17 DIAGNOSIS — R2681 Unsteadiness on feet: Secondary | ICD-10-CM

## 2021-01-17 DIAGNOSIS — R29818 Other symptoms and signs involving the nervous system: Secondary | ICD-10-CM | POA: Diagnosis not present

## 2021-01-17 NOTE — Therapy (Signed)
[facility] [address] [place], [place], [address] Phone: [phone]   Fax:  [phone]  Physical Therapy Treatment  Patient Details  Name: Casey Fisher MRN: [id number] Date of Birth: 1928-01-17 Referring Provider (PT): Stephanie Acre, MD   Encounter Date: 01/17/2021   PT End of Session - 01/17/21 1544    Visit Number 4    Number of Visits 17    Date for PT Re-Evaluation 04/03/21   written for 60 day cert   Authorization Type Medicare - part A and B    Progress Note Due on Visit 10    PT Start Time 1445    PT Stop Time 1530    PT Time Calculation (min) 45 min    Equipment Utilized During Treatment Gait belt    Activity Tolerance Patient tolerated treatment well    Behavior During Therapy [facility] for tasks assessed/performed           Past Medical History:  Diagnosis Date  . Abnormality of gait 03/24/2016  . Anxiety   . Cancer (HCC)   . Cataract   . Depression   . Heart murmur   . Hereditary and idiopathic peripheral neuropathy 03/24/2016  . Hypertension   . Memory difficulty 03/24/2016  . Neuromuscular disorder (HCC)   . Neuropathy   . Prostate cancer (HCC)    remission    Past Surgical History:  Procedure Laterality Date  . CATARACT EXTRACTION Bilateral   . EYE SURGERY    . HERNIA REPAIR  1985  . INGUINAL HERNIA REPAIR Right 12/06/2013   Procedure: HERNIA REPAIR INGUINAL ADULT;  Surgeon: Robyne Askew, MD;  Location: [facility] OR;  Service: General;  Laterality: Right;  . INSERTION OF MESH Right 12/06/2013   Procedure: INSERTION OF MESH;  Surgeon: Robyne Askew, MD;  Location: [facility] OR;  Service: General;  Laterality: Right;  . PROSTATE SURGERY      There were no vitals filed for this visit.   Subjective Assessment - 01/17/21 1502    Subjective Requesting to be wheeled back into clinic. has been doing some of the exercises.    Patient is accompained by: --   friend, [name]   Pertinent History  PMH: peripheral neuropathy, parkinsonism, HTN, prostate cancer, chronic gait disorder    Limitations Walking;Standing    How long can you walk comfortably? not very long - approx. 30' back into clinic from waiting room    Patient Stated Goals just wants to be able to walk    Currently in Pain? No/denies              [facility] PT Assessment - 01/17/21 1515      Standardized Balance Assessment   Standardized Balance Assessment Timed Up and Go Test      Timed Up and Go Test   TUG Comments 2 minutes and 48 seconds with rollator, needing guard/min A and mod A at times for turning                         [facility] Adult PT Treatment/Exercise - 01/17/21 0001      Transfers   Transfers Sit to Stand;Stand to Sit;Stand Pivot Transfers    Sit to Stand 4: Min guard    Sit to Stand Details Visual cues for safe use of DME/AE;Verbal cues for sequencing;Verbal cues for technique    Sit to Stand Details (indicate cue type and reason) cues to scoot  out towards edge of chair, performed x5 reps at start of session and additional reps throughout, once in standing cues for tallest posture before gait    Stand to Sit 4: Min guard    Stand to Sit Details (indicate cue type and reason) Visual cues for safe use of DME/AE;Visual cues/gestures for precautions/safety;Visual cues/gestures for sequencing;Verbal cues for sequencing;Verbal cues for technique    Stand to Sit Details cues to lock brakes and reach back to sit down, therapist needing to assist pt with brake management at times for safety    Stand Pivot Transfers 4: Min assist    Stand Pivot Transfer Details (indicate cue type and reason) with rollator, performed approx. 4 reps throughout session, needing assist to keep rollator close and which direction to turn rollator before sitting down in chair      Ambulation/Gait   Ambulation/Gait Yes    Ambulation/Gait Assistance 4: Min assist;3: Mod assist    Ambulation/Gait Assistance Details pt  wheeled back to gym in  clinic wheelchair, small distance gait (3 x 10-15'), cues for tall posture, staying close to rollator, and for incr step length. pt continues with shuffled gait. pt needing min A at times to keep close to rollator and for turning with rollator, pt needing one instance of mod A during gait in TUG due to episode of freezing/festination and incr weight shifitng forwards onto toes. pt needing cues to stop and reset with tall posture. educated pt and pt's friend [name] on safety with freezing in order to decr risk of falls, both verbalized understanding    Assistive device Rollator    Gait Pattern Step-to pattern;Step-through pattern;Decreased step length - right;Decreased step length - left;Decreased dorsiflexion - right;Decreased dorsiflexion - left;Right foot flat;Left foot flat;Right flexed knee in stance;Left flexed knee in stance;Left genu recurvatum;Trunk flexed;Poor foot clearance - left;Poor foot clearance - right    Ambulation Surface Level;Indoor    Pre-Gait Activities standing with rollator: standing weight shifting R/L x10 reps B, verbal and demo cues for technique and posture      Therapeutic Activites    Therapeutic Activities --    Other Therapeutic Activities --      Exercises   Exercises Other Exercises    Other Exercises  reviewed seated hamstring stretch (as pt has not yet performed at home) 3 x 30 seconds B.Seated marching x 10 reps B with cues for posture and use of boomwhacker as visual cue on how high to lift leg                  PT Education - 01/17/21 1544    Education Details safety with freezing episodes during gait, safety with brake management during transfers.    Person(s) Educated Patient   pt's friend [name]   Methods Explanation;Demonstration;Verbal cues    Comprehension Verbalized understanding;Returned demonstration;Need further instruction            PT Short Term Goals - 01/17/21 1511      PT SHORT TERM GOAL #1   Title Pt  and caregiver will be independent with initial HEP in order to build upon functional gains made in therapy. ALL STGS DUE 01/31/21    Time 4    Period Weeks    Status New    Target Date 01/31/21      PT SHORT TERM GOAL #2   Title Pt will undergo further assessment of TUG - LTG written as appropriate.    Baseline 2 minutes and 48 seconds with  rollator, needing guard/min A and mod A at times for turning    Time 4    Period Weeks    Status Achieved      PT SHORT TERM GOAL #3   Title Pt will incr gait speed with rollator to at least .8 ft/sec in order to demo improved household mobility.    Baseline .45 ft/sec    Time 4    Period Weeks    Status New      PT SHORT TERM GOAL #4   Title Pt will be able to ambulate 28' with supervision with rollator in order to demo improved household mobility.    Time 4    Period Weeks    Status New      PT SHORT TERM GOAL #5   Title Pt will decr 5x sit <> stand time to 1 minute and 15 seconds or less in order to demo improved transfer efficiency and balance.    Baseline 1 minute and 24 seconds from standard height chair.    Time 4    Period Weeks    Status New             PT Long Term Goals - 01/17/21 1546      PT LONG TERM GOAL #1   Title Pt and caregiver will be independent with final HEP in order to build upon functional gains made in therapy. ALL LTGS DUE 02/28/21    Time 8    Period Weeks    Status New      PT LONG TERM GOAL #2   Title Pt will decr TUG to 2 minutes and 25 seconds in order to demo decr fall risk and improved functional mobility.    Baseline 2 minutes 48 seconds    Time 8    Period Weeks    Status Revised      PT LONG TERM GOAL #3   Title Pt will decr 5x sit <> stand time to 65 seconds or less in order to demo improved transfer efficiency and balance.    Baseline 1 minute and 24 seconds from standard height chair.    Time 8    Period Weeks    Status New      PT LONG TERM GOAL #4   Title Pt will incr gait speed  with rollator to at least 1.0 ft/sec in order to demo improved household mobility.    Baseline .58 ft/sec    Time 8    Period Weeks    Status New      PT LONG TERM GOAL #5   Title Pt will be able to ambulate 100' with supervision with rollator vs. RW in order to demo improved household mobility.    Time 8    Period Weeks    Status New                 Plan - 01/17/21 1547    Clinical Impression Statement Pt wheeled back into clinic today in w/c. Performed the TUG today with pt performing in 2 minutes and 48 seconds - LTG revised as appropriate. Pt needing mod A for safety during one instance during gait with pt freezing and coming up onto toes. Edcuated pt and pt's friend Information systems manager with freezing/festination episodes to stop and reset and stand tall before continuing with gait in order to decr fall risk. Will continue to progress towards LTGs.    Personal Factors and Comorbidities Age;Comorbidity 3+;Time since onset of injury/illness/exacerbation;Past/Current  Experience    Comorbidities peripheral neuropathy, parkinsonism, HTN, prostate cancer    Examination-Activity Limitations Locomotion Level;Sit;Stand;Transfers    Examination-Participation Restrictions Cleaning;Community Activity;Medication Management;Meal Prep   pt gets meals on wheels   Stability/Clinical Decision Making Evolving/Moderate complexity    Rehab Potential Good    PT Frequency 2x / week    PT Duration 8 weeks    PT Treatment/Interventions ADLs/Self Care Home Management;DME Instruction;Functional mobility training;Therapeutic activities;Gait training;Therapeutic exercise;Balance training;Neuromuscular re-education;Patient/family education;Passive range of motion;Energy conservation;Vestibular    PT Next Visit Plan pt might need to be wheeled back into clinic in w/c to save time.how is HEP? BLE strengthening/hamstring stretching, and posture work. sit <> stand training,  scifit would be good. modified seated and  supine PWR.    Consulted and Agree with Plan of Care Patient;Family member/caregiver    Family Member Consulted pt's friend Hydrologist           Patient will benefit from skilled therapeutic intervention in order to improve the following deficits and impairments:  Abnormal gait,Decreased balance,Decreased activity tolerance,Decreased coordination,Decreased endurance,Decreased knowledge of use of DME,Decreased mobility,Decreased safety awareness,Decreased range of motion,Difficulty walking,Decreased strength,Hypomobility,Impaired flexibility,Postural dysfunction  Visit Diagnosis: Unsteadiness on feet  Muscle weakness (generalized)  Other abnormalities of gait and mobility  Abnormal posture     Problem List Patient Active Problem List   Diagnosis Date Noted  . Primary osteoarthritis of right knee 05/23/2020  . Low back pain 08/03/2019  . Acute pain of left shoulder 10/10/2018  . Chest pain 11/08/2017  . Memory difficulty 03/24/2016  . Hereditary and idiopathic peripheral neuropathy 03/24/2016  . Abnormality of gait 03/24/2016  . Peripheral neuropathy 04/18/2015  . Hyperlipidemia 04/18/2015  . Prostate cancer (HCC) 08/24/2014  . Right inguinal hernia 10/30/2013    Catalina Gravel, DPT  01/17/2021, 3:50 PM  [facility] [address] [place], [place], [address] Phone: [phone]   Fax:  [phone]  Name: Casey Fisher MRN: [id number] Date of Birth: 08-10-1928

## 2021-01-20 ENCOUNTER — Other Ambulatory Visit: Payer: Self-pay

## 2021-01-20 ENCOUNTER — Encounter: Payer: Self-pay | Admitting: Physical Therapy

## 2021-01-20 ENCOUNTER — Ambulatory Visit: Payer: Medicare Other | Admitting: Physical Therapy

## 2021-01-20 DIAGNOSIS — R262 Difficulty in walking, not elsewhere classified: Secondary | ICD-10-CM | POA: Diagnosis not present

## 2021-01-20 DIAGNOSIS — R2689 Other abnormalities of gait and mobility: Secondary | ICD-10-CM | POA: Diagnosis not present

## 2021-01-20 DIAGNOSIS — R293 Abnormal posture: Secondary | ICD-10-CM

## 2021-01-20 DIAGNOSIS — M6281 Muscle weakness (generalized): Secondary | ICD-10-CM | POA: Diagnosis not present

## 2021-01-20 DIAGNOSIS — R2681 Unsteadiness on feet: Secondary | ICD-10-CM

## 2021-01-20 DIAGNOSIS — R29818 Other symptoms and signs involving the nervous system: Secondary | ICD-10-CM | POA: Diagnosis not present

## 2021-01-20 NOTE — Therapy (Signed)
[facility] [address] [place], [place], [address] Phone: [phone]   Fax:  [phone]  Physical Therapy Treatment  Patient Details  Name: Casey Fisher MRN: [id number] Date of Birth: [birth year] Referring Provider (PT): Margette Fast, MD   Encounter Date: 01/20/2021   PT End of Session - 01/20/21 1538    Visit Number 5    Number of Visits 17    Date for PT Re-Evaluation 04/03/21   written for 60 day cert   Authorization Type Medicare - part A and B    Progress Note Due on Visit 10    PT Start Time 1448    PT Stop Time 1530    PT Time Calculation (min) 42 min    Equipment Utilized During Treatment Gait belt    Activity Tolerance Patient tolerated treatment well    Behavior During Therapy [facility] for tasks assessed/performed           Past Medical History:  Diagnosis Date  . Abnormality of gait 03/24/2016  . Anxiety   . Cancer ([place])   . Cataract   . Depression   . Heart murmur   . Hereditary and idiopathic peripheral neuropathy 03/24/2016  . Hypertension   . Memory difficulty 03/24/2016  . Neuromuscular disorder ([place])   . Neuropathy   . Prostate cancer ([place])    remission    Past Surgical History:  Procedure Laterality Date  . CATARACT EXTRACTION Bilateral   . EYE SURGERY    . HERNIA REPAIR  1985  . INGUINAL HERNIA REPAIR Right 12/06/2013   Procedure: HERNIA REPAIR INGUINAL ADULT;  Surgeon: Merrie Roof, MD;  Location: [facility];  Service: General;  Laterality: Right;  . INSERTION OF MESH Right 12/06/2013   Procedure: INSERTION OF MESH;  Surgeon: Merrie Roof, MD;  Location: [facility];  Service: General;  Laterality: Right;  . PROSTATE SURGERY      There were no vitals filed for this visit.   Subjective Assessment - 01/20/21 1453    Subjective Nothing new since the last time he was here. Exercises are going well at home. Pt wheeled back into clinic today.    Patient is accompained by: --    friend, [name]   Pertinent History PMH: peripheral neuropathy, parkinsonism, HTN, prostate cancer, chronic gait disorder    Limitations Walking;Standing    How long can you walk comfortably? not very long - approx. 30' back into clinic from waiting room    Patient Stated Goals just wants to be able to walk    Currently in Pain? No/denies                             [facility] Adult PT Treatment/Exercise - 01/20/21 0001      Transfers   Transfers Sit to Stand;Stand to Sit;Stand Pivot Transfers    Sit to Stand 4: Min guard;4: Min assist    Sit to Stand Details Visual cues for safe use of DME/AE;Verbal cues for sequencing;Verbal cues for technique    Sit to Stand Details (indicate cue type and reason) cues to scoot out towards edge, feet placement, and proper UE placement. verbal and manual cues for B knee extension in standing    Stand to Sit 4: Min guard    Stand to Sit Details (indicate cue type and reason) Visual cues for safe use of DME/AE;Visual cues/gestures for precautions/safety;Visual cues/gestures for sequencing;Verbal cues for  sequencing;Verbal cues for technique    Stand to Sit Details cues to lock brakes prior to sitting    Comments performed approx. 5 reps total throughout session      Ambulation/Gait   Ambulation/Gait Yes    Ambulation/Gait Assistance 4: Min assist    Ambulation/Gait Assistance Details performed gait with rollator with cues to stay close to rollator and for tall posture throughout, cues for incr step length esp with RLE. needing cues at times to stop and reset when pt demonstrates too far forward flexed posture. attempted gait with the UpWalker to see it pt  could demo more upright posture during gait - no significant improvement despite adjusting the arm rest heights 2 different times, needing assist to steer at times, when pt more fatigued pt demonstrating a step to gait pattern. seated w/c rest break in between    Ambulation Distance (Feet) 45  Feet   x1, 50 x 1   Assistive device Rollator;Other (Comment)   UpWalker   Gait Pattern Step-to pattern;Step-through pattern;Decreased step length - right;Decreased step length - left;Decreased dorsiflexion - right;Decreased dorsiflexion - left;Right foot flat;Left foot flat;Right flexed knee in stance;Left flexed knee in stance;Left genu recurvatum;Trunk flexed;Poor foot clearance - left;Poor foot clearance - right    Ambulation Surface Level;Indoor    Gait Comments pt wheeled in and out of session today in clinic w/c      Exercises   Exercises Knee/Hip      Knee/Hip Exercises: Seated   Long Arc Quad Strengthening;AROM;Both;2 sets;10 reps    Long Arc Quad Weight 2 lbs.   lbs   Long [employer] Limitations use of boomwhacker as visual cue on how high to kick leg, verbal and tactile cues to hold leg at the top of ROM for a couple seconds                    PT Short Term Goals - 01/17/21 1511      PT SHORT TERM GOAL #1   Title Pt and caregiver will be independent with initial HEP in order to build upon functional gains made in therapy. ALL STGS DUE 01/31/21    Time 4    Period Weeks    Status New    Target Date 01/31/21      PT SHORT TERM GOAL #2   Title Pt will undergo further assessment of TUG - LTG written as appropriate.    Baseline 2 minutes and 48 seconds with rollator, needing guard/min A and mod A at times for turning    Time 4    Period Weeks    Status Achieved      PT SHORT TERM GOAL #3   Title Pt will incr gait speed with rollator to at least .8 ft/sec in order to demo improved household mobility.    Baseline .61 ft/sec    Time 4    Period Weeks    Status New      PT SHORT TERM GOAL #4   Title Pt will be able to ambulate 61' with supervision with rollator in order to demo improved household mobility.    Time 4    Period Weeks    Status New      PT SHORT TERM GOAL #5   Title Pt will decr 5x sit <> stand time to 1 minute and 15 seconds or less in order to  demo improved transfer efficiency and balance.    Baseline 1 minute and 24 seconds from  standard height chair.    Time 4    Period Weeks    Status New             PT Long Term Goals - 01/17/21 1546      PT LONG TERM GOAL #1   Title Pt and caregiver will be independent with final HEP in order to build upon functional gains made in therapy. ALL LTGS DUE 02/28/21    Time 8    Period Weeks    Status New      PT LONG TERM GOAL #2   Title Pt will decr TUG to 2 minutes and 25 seconds in order to demo decr fall risk and improved functional mobility.    Baseline 2 minutes 48 seconds    Time 8    Period Weeks    Status Revised      PT LONG TERM GOAL #3   Title Pt will decr 5x sit <> stand time to 65 seconds or less in order to demo improved transfer efficiency and balance.    Baseline 1 minute and 24 seconds from standard height chair.    Time 8    Period Weeks    Status New      PT LONG TERM GOAL #4   Title Pt will incr gait speed with rollator to at least 1.0 ft/sec in order to demo improved household mobility.    Baseline .58 ft/sec    Time 8    Period Weeks    Status New      PT LONG TERM GOAL #5   Title Pt will be able to ambulate 100' with supervision with rollator vs. RW in order to demo improved household mobility.    Time 8    Period Weeks    Status New                 Plan - 01/20/21 1544    Clinical Impression Statement Pt wheeled in and out of clinic today in manual w/c. Performed gait training today with rollator with pt initially able to demonstrate incr R step length during gait and step through pattern for approx 15-20'. Attempted the UpWalker today to see if it would help pt have more upright posture during gait - no significant difference noticed although pt stated he liked using it. Will continue to progress towards LTGs.    Personal Factors and Comorbidities Age;Comorbidity 3+;Time since onset of injury/illness/exacerbation;Past/Current Experience     Comorbidities peripheral neuropathy, parkinsonism, HTN, prostate cancer    Examination-Activity Limitations Locomotion Level;Sit;Stand;Transfers    Examination-Participation Restrictions Cleaning;Community Activity;Medication Management;Meal Prep   pt gets meals on wheels   Stability/Clinical Decision Making Evolving/Moderate complexity    Rehab Potential Good    PT Frequency 2x / week    PT Duration 8 weeks    PT Treatment/Interventions ADLs/Self Care Home Management;DME Instruction;Functional mobility training;Therapeutic activities;Gait training;Therapeutic exercise;Balance training;Neuromuscular re-education;Patient/family education;Passive range of motion;Energy conservation;Vestibular    PT Next Visit Plan pt might need to be wheeled back into clinic in w/c to save time.how is HEP? BLE strengthening/hamstring stretching, and posture work. sit <> stand training,  scifit would be good. modified seated and supine PWR.    Consulted and Agree with Plan of Care Patient;Family member/caregiver    Family Member Consulted pt's friend [name]           Patient will benefit from skilled therapeutic intervention in order to improve the following deficits and impairments:  Abnormal gait,Decreased balance,Decreased activity tolerance,Decreased  coordination,Decreased endurance,Decreased knowledge of use of DME,Decreased mobility,Decreased safety awareness,Decreased range of motion,Difficulty walking,Decreased strength,Hypomobility,Impaired flexibility,Postural dysfunction  Visit Diagnosis: Muscle weakness (generalized)  Other abnormalities of gait and mobility  Unsteadiness on feet  Abnormal posture     Problem List Patient Active Problem List   Diagnosis Date Noted  . Primary osteoarthritis of right knee 05/23/2020  . Low back pain 08/03/2019  . Acute pain of left shoulder 10/10/2018  . Chest pain 11/08/2017  . Memory difficulty 03/24/2016  . Hereditary and idiopathic peripheral  neuropathy 03/24/2016  . Abnormality of gait 03/24/2016  . Peripheral neuropathy 04/18/2015  . Hyperlipidemia 04/18/2015  . Prostate cancer ([place]) 08/24/2014  . Right inguinal hernia 10/30/2013    Arliss Journey, PT, DPT  01/20/2021, 3:46 PM  [facility] [address] [place], [place], [address] Phone: [phone]   Fax:  [phone]  Name: JUNO LIDEN MRN: [id number] Date of Birth: 1928-03-13

## 2021-01-24 ENCOUNTER — Encounter: Payer: Self-pay | Admitting: Physical Therapy

## 2021-01-24 ENCOUNTER — Other Ambulatory Visit: Payer: Self-pay

## 2021-01-24 ENCOUNTER — Ambulatory Visit: Payer: Medicare Other | Admitting: Physical Therapy

## 2021-01-24 DIAGNOSIS — R293 Abnormal posture: Secondary | ICD-10-CM | POA: Diagnosis not present

## 2021-01-24 DIAGNOSIS — R2689 Other abnormalities of gait and mobility: Secondary | ICD-10-CM | POA: Diagnosis not present

## 2021-01-24 DIAGNOSIS — R262 Difficulty in walking, not elsewhere classified: Secondary | ICD-10-CM | POA: Diagnosis not present

## 2021-01-24 DIAGNOSIS — R29818 Other symptoms and signs involving the nervous system: Secondary | ICD-10-CM | POA: Diagnosis not present

## 2021-01-24 DIAGNOSIS — M6281 Muscle weakness (generalized): Secondary | ICD-10-CM | POA: Diagnosis not present

## 2021-01-24 DIAGNOSIS — R2681 Unsteadiness on feet: Secondary | ICD-10-CM | POA: Diagnosis not present

## 2021-01-26 NOTE — Therapy (Signed)
[facility] [address] [place], [place], [address] Phone: [phone]   Fax:  [phone]  Physical Therapy Treatment  Patient Details  Name: Casey Fisher MRN: [id number] Date of Birth: 1928/09/01 Referring Provider (PT): Margette Fast, MD   Encounter Date: 01/24/2021     01/24/21 1455  PT Visits / Re-Eval  Visit Number 6  Number of Visits 17  Date for PT Re-Evaluation 04/03/21 (written for 60 day cert)  Authorization  Authorization Type Medicare - part A and B  Progress Note Due on Visit 10  PT Time Calculation  PT Start Time 7371  PT Stop Time 1530  PT Time Calculation (min) 43 min  PT - End of Session  Equipment Utilized During Treatment Gait belt  Activity Tolerance Patient tolerated treatment well  Behavior During Therapy [facility] for tasks assessed/performed    Past Medical History:  Diagnosis Date  . Abnormality of gait 03/24/2016  . Anxiety   . Cancer ([place])   . Cataract   . Depression   . Heart murmur   . Hereditary and idiopathic peripheral neuropathy 03/24/2016  . Hypertension   . Memory difficulty 03/24/2016  . Neuromuscular disorder ([place])   . Neuropathy   . Prostate cancer ([place])    remission    Past Surgical History:  Procedure Laterality Date  . CATARACT EXTRACTION Bilateral   . EYE SURGERY    . HERNIA REPAIR  1985  . INGUINAL HERNIA REPAIR Right 12/06/2013   Procedure: HERNIA REPAIR INGUINAL ADULT;  Surgeon: Merrie Roof, MD;  Location: [facility];  Service: General;  Laterality: Right;  . INSERTION OF MESH Right 12/06/2013   Procedure: INSERTION OF MESH;  Surgeon: Merrie Roof, MD;  Location: [facility];  Service: General;  Laterality: Right;  . PROSTATE SURGERY      There were no vitals filed for this visit.      01/24/21 1454  Symptoms/Limitations  Subjective No new complaints. No falls or pain to report. States the ex's are going well at home. Use of wheelchair to  enter/exit session today.  Patient is accompained by:  (friend, [name])  Pertinent History PMH: peripheral neuropathy, parkinsonism, HTN, prostate cancer, chronic gait disorder  Limitations Walking;Standing  How long can you walk comfortably? not very long - approx. 30' back into clinic from waiting room  Patient Stated Goals just wants to be able to walk  Pain Assessment  Currently in Pain? No/denies      01/24/21 1456  Transfers  Transfers Sit to Stand;Stand to [employer] Transfers  Sit to Stand 4: Min guard;4: Min assist  Sit to Stand Details Visual cues for safe use of DME/AE;Verbal cues for sequencing;Verbal cues for technique  Stand to Sit 4: Min guard  Stand to Sit Details (indicate cue type and reason) Visual cues for safe use of DME/AE;Visual cues/gestures for precautions/safety;Visual cues/gestures for sequencing;Verbal cues for sequencing;Verbal cues for technique  Stand Pivot Transfers 4: Min assist  Stand Pivot Transfer Details (indicate cue type and reason) with rollator from wheelchair to mat table  Ambulation/Gait  Ambulation/Gait Yes  Ambulation/Gait Assistance 4: Min assist  Ambulation/Gait Assistance Details cues needed for posture, step length and rollator position with gait. cues for increased bil knee extension as well with gait. assist for rollator control with turns as well.  Ambulation Distance (Feet) 115 Feet  Assistive device Rollator  Gait Pattern Step-through pattern;Decreased step length - right;Decreased step length - left;Decreased dorsiflexion - right;Decreased  dorsiflexion - left;Right foot flat;Left foot flat;Right flexed knee in stance;Left flexed knee in stance;Left genu recurvatum;Trunk flexed;Poor foot clearance - left;Poor foot clearance - right  Ambulation Surface Level;Indoor  Neuro Re-ed   Neuro Re-ed Details  for posture/strengthening/balance: seated modifiied PWR! up and PWR! twist performed for 10 reps each with cues on form and  technique ;standing with UE support on chair: alternating reaching up working on trunk elongation, then alternating lateral stepping out/back in ofr 10 reps each/each side with verbal/visual cues on form and technqiue.  Knee/Hip Exercises: Aerobic  Other Aerobic Scifit level 1.5 x5 minutes with goal >/= 50-55 rpm for strengthening and activity tolerance.  Knee/Hip Exercises: Supine  Bridges AROM;Strengthening;Both;1 set;5 reps;Limitations  Quad Sets AROM;AAROM;Strengthening;Both;1 set;5 reps;Limitations  Quad Sets Limitations overpressure for knee extension stretch with each rep.  Bridges Limitations assist to stabilize LE's in hooklying position with limited bridge height noted. with each rep.          PT Short Term Goals - 01/17/21 1511      PT SHORT TERM GOAL #1   Title Pt and caregiver will be independent with initial HEP in order to build upon functional gains made in therapy. ALL STGS DUE 01/31/21    Time 4    Period Weeks    Status New    Target Date 01/31/21      PT SHORT TERM GOAL #2   Title Pt will undergo further assessment of TUG - LTG written as appropriate.    Baseline 2 minutes and 48 seconds with rollator, needing guard/min A and mod A at times for turning    Time 4    Period Weeks    Status Achieved      PT SHORT TERM GOAL #3   Title Pt will incr gait speed with rollator to at least .8 ft/sec in order to demo improved household mobility.    Baseline .20 ft/sec    Time 4    Period Weeks    Status New      PT SHORT TERM GOAL #4   Title Pt will be able to ambulate 23' with supervision with rollator in order to demo improved household mobility.    Time 4    Period Weeks    Status New      PT SHORT TERM GOAL #5   Title Pt will decr 5x sit <> stand time to 1 minute and 15 seconds or less in order to demo improved transfer efficiency and balance.    Baseline 1 minute and 24 seconds from standard height chair.    Time 4    Period Weeks    Status New              PT Long Term Goals - 01/17/21 1546      PT LONG TERM GOAL #1   Title Pt and caregiver will be independent with final HEP in order to build upon functional gains made in therapy. ALL LTGS DUE 02/28/21    Time 8    Period Weeks    Status New      PT LONG TERM GOAL #2   Title Pt will decr TUG to 2 minutes and 25 seconds in order to demo decr fall risk and improved functional mobility.    Baseline 2 minutes 48 seconds    Time 8    Period Weeks    Status Revised      PT LONG TERM GOAL #3   Title Pt will  decr 5x sit <> stand time to 65 seconds or less in order to demo improved transfer efficiency and balance.    Baseline 1 minute and 24 seconds from standard height chair.    Time 8    Period Weeks    Status New      PT LONG TERM GOAL #4   Title Pt will incr gait speed with rollator to at least 1.0 ft/sec in order to demo improved household mobility.    Baseline .58 ft/sec    Time 8    Period Weeks    Status New      PT LONG TERM GOAL #5   Title Pt will be able to ambulate 100' with supervision with rollator vs. RW in order to demo improved household mobility.    Time 8    Period Weeks    Status New             01/24/21 1455  Plan  Clinical Impression Statement Today's skilled session continued to address LE stretching/strengthening, transfer training and gait training. Less overall assistance/rest breaks needed with gait this session. The pt is progressing toward goals and should benefit from continued PT to progress toward unmet goals.  Personal Factors and Comorbidities Age;Comorbidity 3+;Time since onset of injury/illness/exacerbation;Past/Current Experience  Comorbidities peripheral neuropathy, parkinsonism, HTN, prostate cancer  Examination-Activity Limitations Locomotion Level;Sit;Stand;Transfers  Examination-Participation Restrictions Cleaning;Community Activity;Medication Management;Meal Prep (pt gets meals on wheels)  Pt will benefit from skilled therapeutic  intervention in order to improve on the following deficits Abnormal gait;Decreased balance;Decreased activity tolerance;Decreased coordination;Decreased endurance;Decreased knowledge of use of DME;Decreased mobility;Decreased safety awareness;Decreased range of motion;Difficulty walking;Decreased strength;Hypomobility;Impaired flexibility;Postural dysfunction  Stability/Clinical Decision Making Evolving/Moderate complexity  Rehab Potential Good  PT Frequency 2x / week  PT Duration 8 weeks  PT Treatment/Interventions ADLs/Self Care Home Management;DME Instruction;Functional mobility training;Therapeutic activities;Gait training;Therapeutic exercise;Balance training;Neuromuscular re-education;Patient/family education;Passive range of motion;Energy conservation;Vestibular  PT Next Visit Plan pt might need to be wheeled back into clinic in w/c to save time.how is HEP? BLE strengthening/hamstring stretching, and posture work. sit <> stand training,  scifit would be good. modified seated and supine PWR.  Consulted and Agree with Plan of Care Patient;Family member/caregiver  Family Member Consulted pt's friend Journalist, newspaper          Patient will benefit from skilled therapeutic intervention in order to improve the following deficits and impairments:  Abnormal gait,Decreased balance,Decreased activity tolerance,Decreased coordination,Decreased endurance,Decreased knowledge of use of DME,Decreased mobility,Decreased safety awareness,Decreased range of motion,Difficulty walking,Decreased strength,Hypomobility,Impaired flexibility,Postural dysfunction  Visit Diagnosis: Muscle weakness (generalized)  Other abnormalities of gait and mobility  Unsteadiness on feet  Abnormal posture  Difficulty in walking, not elsewhere classified  Other symptoms and signs involving the nervous system     Problem List Patient Active Problem List   Diagnosis Date Noted  . Primary osteoarthritis of right knee  05/23/2020  . Low back pain 08/03/2019  . Acute pain of left shoulder 10/10/2018  . Chest pain 11/08/2017  . Memory difficulty 03/24/2016  . Hereditary and idiopathic peripheral neuropathy 03/24/2016  . Abnormality of gait 03/24/2016  . Peripheral neuropathy 04/18/2015  . Hyperlipidemia 04/18/2015  . Prostate cancer ([place]) 08/24/2014  . Right inguinal hernia 10/30/2013    Willow Ora, PTA, [facility] [address] [place], [place] [address] [phone] 01/26/21, 9:27 PM   Name: Casey Fisher MRN: [id number] Date of Birth: 06-26-1928

## 2021-01-27 ENCOUNTER — Encounter: Payer: Self-pay | Admitting: Physical Therapy

## 2021-01-27 ENCOUNTER — Ambulatory Visit: Payer: Medicare Other | Admitting: Physical Therapy

## 2021-01-27 ENCOUNTER — Other Ambulatory Visit: Payer: Self-pay

## 2021-01-27 DIAGNOSIS — M6281 Muscle weakness (generalized): Secondary | ICD-10-CM

## 2021-01-27 DIAGNOSIS — R2689 Other abnormalities of gait and mobility: Secondary | ICD-10-CM | POA: Diagnosis not present

## 2021-01-27 DIAGNOSIS — R2681 Unsteadiness on feet: Secondary | ICD-10-CM

## 2021-01-27 DIAGNOSIS — R262 Difficulty in walking, not elsewhere classified: Secondary | ICD-10-CM | POA: Diagnosis not present

## 2021-01-27 DIAGNOSIS — R29818 Other symptoms and signs involving the nervous system: Secondary | ICD-10-CM | POA: Diagnosis not present

## 2021-01-27 DIAGNOSIS — R293 Abnormal posture: Secondary | ICD-10-CM | POA: Diagnosis not present

## 2021-01-27 NOTE — Therapy (Signed)
[facility] [address] [place], [place], [address] Phone: [phone]   Fax:  [phone]  Physical Therapy Treatment  Patient Details  Name: Casey Fisher MRN: [id number] Date of Birth: May 26, 1928 Referring Provider (PT): Margette Fast, MD   Encounter Date: 01/27/2021   PT End of Session - 01/27/21 1606    Visit Number 7    Number of Visits 17    Date for PT Re-Evaluation 04/03/21   written for 60 day cert   Authorization Type Medicare - part A and B    Progress Note Due on Visit 10    PT Start Time [id number]    PT Stop Time 1530    PT Time Calculation (min) 45 min    Equipment Utilized During Treatment Gait belt    Activity Tolerance Patient tolerated treatment well    Behavior During Therapy [facility] for tasks assessed/performed           Past Medical History:  Diagnosis Date  . Abnormality of gait 03/24/2016  . Anxiety   . Cancer ([place])   . Cataract   . Depression   . Heart murmur   . Hereditary and idiopathic peripheral neuropathy 03/24/2016  . Hypertension   . Memory difficulty 03/24/2016  . Neuromuscular disorder ([place])   . Neuropathy   . Prostate cancer ([place])    remission    Past Surgical History:  Procedure Laterality Date  . CATARACT EXTRACTION Bilateral   . EYE SURGERY    . HERNIA REPAIR  1985  . INGUINAL HERNIA REPAIR Right 12/06/2013   Procedure: HERNIA REPAIR INGUINAL ADULT;  Surgeon: Merrie Roof, MD;  Location: [facility];  Service: General;  Laterality: Right;  . INSERTION OF MESH Right 12/06/2013   Procedure: INSERTION OF MESH;  Surgeon: Merrie Roof, MD;  Location: [facility];  Service: General;  Laterality: Right;  . PROSTATE SURGERY      There were no vitals filed for this visit.   Subjective Assessment - 01/27/21 1450    Subjective Walks back into session with rollator today. Exercises going well at home.    Patient is accompained by: --   friend, [name]   Pertinent History PMH:  peripheral neuropathy, parkinsonism, HTN, prostate cancer, chronic gait disorder    Limitations Walking;Standing    How long can you walk comfortably? not very long - approx. 30' back into clinic from waiting room    Patient Stated Goals just wants to be able to walk    Currently in Pain? No/denies                        Pt performs PWR! Moves in supine position   PWR! Up for improved posture - x10 reps through chest/shoulders with verbal and tactile cues for scapular retraction, 2 x 10 reps bridging with verbal cues for technique and incr ROM   PWR! Rock for improved weighshifting x10 reps B with knees in hooklying position, therapist initially needing to help weight shift through BLE and visual cues for how high to reach with hand, pt improving with ROM with incr reps   PWR! Step for improved step initiation x10 reps B lifting up leg out to the side and then back in, needing initial manual cues for proper technique   Cues provided for technique and intensity of movement.    Pt performs PWR! Moves in sitting position:    PWR! Up for improved posture  x10 reps, verbal and demo cues for technique   PWR! Rock for improved weighshifting x5 reps reaching superiorly/laterally, then x10 reps B bringing down elbow to the thigh and reaching up overhead   PWR! Step for improved step initiation x10 reps B with stepping over 2" foam beam for incr step height   Verbal and demo cues for proper technique         [facility] Adult PT Treatment/Exercise - 01/27/21 0001      Transfers   Sit to Stand 4: Min guard    Sit to Stand Details Visual cues for safe use of DME/AE;Verbal cues for sequencing;Verbal cues for technique    Stand to Sit 4: Min guard    Stand to Sit Details (indicate cue type and reason) Visual cues for safe use of DME/AE;Visual cues/gestures for precautions/safety;Visual cues/gestures for sequencing;Verbal cues for sequencing;Verbal cues for technique    Stand Pivot  Transfers 4: Min assist    Stand Pivot Transfer Details (indicate cue type and reason) walking back into clinic with rollator to turn to sit to mat table      Ambulation/Gait   Ambulation/Gait Yes    Ambulation/Gait Assistance 4: Min assist    Ambulation/Gait Assistance Details ambulated 100' into clinic at start of session with cues for tall posture, B knee extension, and incr step length B. verbal and manual cues to also stay close to rollator    Ambulation Distance (Feet) 100 Feet   x1, 40' x 1   Assistive device Rollator    Gait Pattern Step-through pattern;Decreased step length - right;Decreased step length - left;Decreased dorsiflexion - right;Decreased dorsiflexion - left;Right foot flat;Left foot flat;Right flexed knee in stance;Left flexed knee in stance;Left genu recurvatum;Trunk flexed;Poor foot clearance - left;Poor foot clearance - right    Ambulation Surface Level;Indoor                    PT Short Term Goals - 01/17/21 1511      PT SHORT TERM GOAL #1   Title Pt and caregiver will be independent with initial HEP in order to build upon functional gains made in therapy. ALL STGS DUE 01/31/21    Time 4    Period Weeks    Status New    Target Date 01/31/21      PT SHORT TERM GOAL #2   Title Pt will undergo further assessment of TUG - LTG written as appropriate.    Baseline 2 minutes and 48 seconds with rollator, needing guard/min A and mod A at times for turning    Time 4    Period Weeks    Status Achieved      PT SHORT TERM GOAL #3   Title Pt will incr gait speed with rollator to at least .8 ft/sec in order to demo improved household mobility.    Baseline .93 ft/sec    Time 4    Period Weeks    Status New      PT SHORT TERM GOAL #4   Title Pt will be able to ambulate 61' with supervision with rollator in order to demo improved household mobility.    Time 4    Period Weeks    Status New      PT SHORT TERM GOAL #5   Title Pt will decr 5x sit <> stand time  to 1 minute and 15 seconds or less in order to demo improved transfer efficiency and balance.    Baseline 1 minute and  24 seconds from standard height chair.    Time 4    Period Weeks    Status New             PT Long Term Goals - 01/17/21 1546      PT LONG TERM GOAL #1   Title Pt and caregiver will be independent with final HEP in order to build upon functional gains made in therapy. ALL LTGS DUE 02/28/21    Time 8    Period Weeks    Status New      PT LONG TERM GOAL #2   Title Pt will decr TUG to 2 minutes and 25 seconds in order to demo decr fall risk and improved functional mobility.    Baseline 2 minutes 48 seconds    Time 8    Period Weeks    Status Revised      PT LONG TERM GOAL #3   Title Pt will decr 5x sit <> stand time to 65 seconds or less in order to demo improved transfer efficiency and balance.    Baseline 1 minute and 24 seconds from standard height chair.    Time 8    Period Weeks    Status New      PT LONG TERM GOAL #4   Title Pt will incr gait speed with rollator to at least 1.0 ft/sec in order to demo improved household mobility.    Baseline .58 ft/sec    Time 8    Period Weeks    Status New      PT LONG TERM GOAL #5   Title Pt will be able to ambulate 100' with supervision with rollator vs. RW in order to demo improved household mobility.    Time 8    Period Weeks    Status New                 Plan - 01/27/21 1607    Clinical Impression Statement Pt able to ambulate into clinic with rollator approx. 100' with min A. Attempted to walk out of clinic back towards waiting room at the end of the session, pt only able to make it approx. 29' before needing to get clinic w/c to be wheeled out to the front. Today's session focused on PWR moves in supine/sitting for posture, weight shifting, and step initiation. Will continue to progress towards LTGs.    Personal Factors and Comorbidities Age;Comorbidity 3+;Time since onset of  injury/illness/exacerbation;Past/Current Experience    Comorbidities peripheral neuropathy, parkinsonism, HTN, prostate cancer    Examination-Activity Limitations Locomotion Level;Sit;Stand;Transfers    Examination-Participation Restrictions Cleaning;Community Activity;Medication Management;Meal Prep   pt gets meals on wheels   Stability/Clinical Decision Making Evolving/Moderate complexity    Rehab Potential Good    PT Frequency 2x / week    PT Duration 8 weeks    PT Treatment/Interventions ADLs/Self Care Home Management;DME Instruction;Functional mobility training;Therapeutic activities;Gait training;Therapeutic exercise;Balance training;Neuromuscular re-education;Patient/family education;Passive range of motion;Energy conservation;Vestibular    PT Next Visit Plan STGs due 01/31/21 (forgot to begin checking them today). pt might need to be wheeled back into clinic in w/c to save time.how is HEP? BLE strengthening/hamstring stretching, and posture work. sit <> stand training,  scifit would be good. modified seated and supine PWR.    Consulted and Agree with Plan of Care Patient;Family member/caregiver    Family Member Consulted pt's friend [name]           Patient will benefit from skilled therapeutic intervention in order to  improve the following deficits and impairments:  Abnormal gait,Decreased balance,Decreased activity tolerance,Decreased coordination,Decreased endurance,Decreased knowledge of use of DME,Decreased mobility,Decreased safety awareness,Decreased range of motion,Difficulty walking,Decreased strength,Hypomobility,Impaired flexibility,Postural dysfunction  Visit Diagnosis: Muscle weakness (generalized)  Other abnormalities of gait and mobility  Unsteadiness on feet  Difficulty in walking, not elsewhere classified  Abnormal posture     Problem List Patient Active Problem List   Diagnosis Date Noted  . Primary osteoarthritis of right knee 05/23/2020  . Low back pain  08/03/2019  . Acute pain of left shoulder 10/10/2018  . Chest pain 11/08/2017  . Memory difficulty 03/24/2016  . Hereditary and idiopathic peripheral neuropathy 03/24/2016  . Abnormality of gait 03/24/2016  . Peripheral neuropathy 04/18/2015  . Hyperlipidemia 04/18/2015  . Prostate cancer ([place]) 08/24/2014  . Right inguinal hernia 10/30/2013    Arliss Journey, PT, DPT  01/27/2021, 4:08 PM  [facility] [address] [place], [place], [address] Phone: [phone]   Fax:  [phone]  Name: Casey Fisher MRN: [id number] Date of Birth: Oct 05, 1928

## 2021-01-31 ENCOUNTER — Encounter: Payer: Self-pay | Admitting: Physical Therapy

## 2021-01-31 ENCOUNTER — Ambulatory Visit: Payer: Medicare Other | Attending: Neurology | Admitting: Physical Therapy

## 2021-01-31 ENCOUNTER — Other Ambulatory Visit: Payer: Self-pay

## 2021-01-31 DIAGNOSIS — R262 Difficulty in walking, not elsewhere classified: Secondary | ICD-10-CM

## 2021-01-31 DIAGNOSIS — R293 Abnormal posture: Secondary | ICD-10-CM | POA: Diagnosis not present

## 2021-01-31 DIAGNOSIS — M6281 Muscle weakness (generalized): Secondary | ICD-10-CM | POA: Diagnosis not present

## 2021-01-31 DIAGNOSIS — R2689 Other abnormalities of gait and mobility: Secondary | ICD-10-CM

## 2021-01-31 DIAGNOSIS — R2681 Unsteadiness on feet: Secondary | ICD-10-CM | POA: Diagnosis not present

## 2021-01-31 NOTE — Therapy (Signed)
[employer] [address] [place], [place], [address] Phone: [phone]   Fax:  [phone]  Physical Therapy Treatment  Patient Details  Name: Casey Fisher MRN: [id number] Date of Birth: 12/28/1928 Referring Provider (PT): Margette Fast, MD   Encounter Date: 01/31/2021   PT End of Session - 01/31/21 1454    Visit Number 8    Number of Visits 17    Date for PT Re-Evaluation 04/03/21   written for 60 day cert   Authorization Type Medicare - part A and B    Progress Note Due on Visit 10    PT Start Time 0093    PT Stop Time 1530    PT Time Calculation (min) 43 min    Equipment Utilized During Treatment Gait belt    Activity Tolerance Patient tolerated treatment well    Behavior During Therapy [facility] for tasks assessed/performed           Past Medical History:  Diagnosis Date  . Abnormality of gait 03/24/2016  . Anxiety   . Cancer ([place])   . Cataract   . Depression   . Heart murmur   . Hereditary and idiopathic peripheral neuropathy 03/24/2016  . Hypertension   . Memory difficulty 03/24/2016  . Neuromuscular disorder ([place])   . Neuropathy   . Prostate cancer ([place])    remission    Past Surgical History:  Procedure Laterality Date  . CATARACT EXTRACTION Bilateral   . EYE SURGERY    . HERNIA REPAIR  1985  . INGUINAL HERNIA REPAIR Right 12/06/2013   Procedure: HERNIA REPAIR INGUINAL ADULT;  Surgeon: Merrie Roof, MD;  Location: [facility];  Service: General;  Laterality: Right;  . INSERTION OF MESH Right 12/06/2013   Procedure: INSERTION OF MESH;  Surgeon: Merrie Roof, MD;  Location: [facility];  Service: General;  Laterality: Right;  . PROSTATE SURGERY      There were no vitals filed for this visit.   Subjective Assessment - 01/31/21 1454    Subjective No new complaints. No falls or pain to report.    Pertinent History PMH: peripheral neuropathy, parkinsonism, HTN, prostate cancer, chronic gait disorder     Limitations Walking;Standing    How long can you walk comfortably? not very long - approx. 30' back into clinic from waiting room    Patient Stated Goals just wants to be able to walk    Currently in Pain? No/denies                [facility] Adult PT Treatment/Exercise - 01/31/21 1455      Transfers   Transfers Sit to Stand;Stand to [employer]    Sit to Stand 4: Min guard    Five time sit to stand comments  29.31 sec's with pt using arm rests on standard height chair. despit cues only let go of the armrest to stabilize on walker 2/5 reps.    Stand to Sit 4: Min guard    Stand Pivot Transfers 4: Min guard    Stand Pivot Transfer Details (indicate cue type and reason) with rollator mat>chair, chair>wheelchair, wheelchair<>mat with cues each time on sequencing with rollator, step placement, posture and for bil knee extension in stance.      Ambulation/Gait   Ambulation/Gait Yes    Ambulation/Gait Assistance 4: Min assist    Ambulation/Gait Assistance Details cues for posture, step length and for hip/knee extension. Pt ambulates in a crouched gait pattern.  Ambulation Distance (Feet) 80 Feet   x1, 50 x1, plus around gym with session/transfers   Assistive device Rollator    Gait Pattern Step-through pattern;Decreased step length - right;Decreased step length - left;Decreased dorsiflexion - right;Decreased dorsiflexion - left;Right foot flat;Left foot flat;Right flexed knee in stance;Left flexed knee in stance;Left genu recurvatum;Trunk flexed;Poor foot clearance - left;Poor foot clearance - right    Ambulation Surface Level;Indoor    Gait velocity 41.81 sec=0.78 ft/sec with rollator. W/C follow for safety to complete the full distance.            Pt performs PWR! Moves in sitting position:    PWR! Up for improved posture x10 reps, verbal and demo cues for technique   PWR! Rock for improved weighshifting  x10 reps B bringing down elbow to the thigh and reaching up  overhead with cue to look up at hand, then lateral/forward reach with contralateral LE extension for 7-8 reps each side. Pt with difficulty coordinating this exercise.  PWR! Step for improved step initiation x10 reps B with stepping over 2" foam beam for incr step height (modified to single leg stepping out laterally/back in, alternating sides as pt had difficulty turning/weight shifting pelvis/trunk to allow for bil LE to step over each beam)  Verbal and demo cues for proper technique.           PT Short Term Goals - 01/31/21 1539      PT SHORT TERM GOAL #1   Title Pt and caregiver will be independent with initial HEP in order to build upon functional gains made in therapy. ALL STGS DUE 01/31/21    Time 4    Period Weeks    Status On-going    Target Date 01/31/21      PT SHORT TERM GOAL #2   Title Pt will undergo further assessment of TUG - LTG written as appropriate.    Baseline 2 minutes and 48 seconds with rollator, needing guard/min A and mod A at times for turning    Status Achieved      PT SHORT TERM GOAL #3   Title Pt will incr gait speed with rollator to at least .8 ft/sec in order to demo improved household mobility.    Baseline 01/31/21: 0.78 ft/sec with rollator    Status Achieved      PT SHORT TERM GOAL #4   Title Pt will be able to ambulate 13' with supervision with rollator in order to demo improved household mobility.    Baseline 01/31/21: able to achieve distance, however continues to require min guard to min assist with gait    Status Partially Met      PT SHORT TERM GOAL #5   Title Pt will decr 5x sit <> stand time to 1 minute and 15 seconds or less in order to demo improved transfer efficiency and balance.    Baseline 01/31/21: 29.31 sec's from standard height chair, however pt only leg go of armrest for 2/5 reps despite max cues.    Status Achieved             PT Long Term Goals - 01/17/21 1546      PT LONG TERM GOAL #1   Title Pt and caregiver will be  independent with final HEP in order to build upon functional gains made in therapy. ALL LTGS DUE 02/28/21    Time 8    Period Weeks    Status New      PT LONG TERM GOAL #  2   Title Pt will decr TUG to 2 minutes and 25 seconds in order to demo decr fall risk and improved functional mobility.    Baseline 2 minutes 48 seconds    Time 8    Period Weeks    Status Revised      PT LONG TERM GOAL #3   Title Pt will decr 5x sit <> stand time to 65 seconds or less in order to demo improved transfer efficiency and balance.    Baseline 1 minute and 24 seconds from standard height chair.    Time 8    Period Weeks    Status New      PT LONG TERM GOAL #4   Title Pt will incr gait speed with rollator to at least 1.0 ft/sec in order to demo improved household mobility.    Baseline .58 ft/sec    Time 8    Period Weeks    Status New      PT LONG TERM GOAL #5   Title Pt will be able to ambulate 100' with supervision with rollator vs. RW in order to demo improved household mobility.    Time 8    Period Weeks    Status New                 Plan - 01/31/21 1455    Clinical Impression Statement Today's skilled session focused on progress toward STGs with 3/5 goals met, 1/5 goals partially met and unable to check remaining goal due to caregiver not present. Will plan to check this goal next session. The pt is progressing toward goals and should benefit from continued PT to progress toward LTGs.    Personal Factors and Comorbidities Age;Comorbidity 3+;Time since onset of injury/illness/exacerbation;Past/Current Experience    Comorbidities peripheral neuropathy, parkinsonism, HTN, prostate cancer    Examination-Activity Limitations Locomotion Level;Sit;Stand;Transfers    Examination-Participation Restrictions Cleaning;Community Activity;Medication Management;Meal Prep   pt gets meals on wheels   Stability/Clinical Decision Making Evolving/Moderate complexity    Rehab Potential Good    PT Frequency  2x / week    PT Duration 8 weeks    PT Treatment/Interventions ADLs/Self Care Home Management;DME Instruction;Functional mobility training;Therapeutic activities;Gait training;Therapeutic exercise;Balance training;Neuromuscular re-education;Patient/family education;Passive range of motion;Energy conservation;Vestibular    PT Next Visit Plan Check remaining STGs for HEP when caregiver is present. Pt may need to be wheeled back into the clinic in w/c to save time. continue with bil LE strengthening/hamstring stretching and postural work. Continued with use of Scifit. Continue with modified supine and seatded PWR! ex's.    Consulted and Agree with Plan of Care Patient;Family member/caregiver    Family Member Consulted pt's friend Journalist, newspaper           Patient will benefit from skilled therapeutic intervention in order to improve the following deficits and impairments:  Abnormal gait,Decreased balance,Decreased activity tolerance,Decreased coordination,Decreased endurance,Decreased knowledge of use of DME,Decreased mobility,Decreased safety awareness,Decreased range of motion,Difficulty walking,Decreased strength,Hypomobility,Impaired flexibility,Postural dysfunction  Visit Diagnosis: Muscle weakness (generalized)  Other abnormalities of gait and mobility  Unsteadiness on feet  Difficulty in walking, not elsewhere classified  Abnormal posture     Problem List Patient Active Problem List   Diagnosis Date Noted  . Primary osteoarthritis of right knee 05/23/2020  . Low back pain 08/03/2019  . Acute pain of left shoulder 10/10/2018  . Chest pain 11/08/2017  . Memory difficulty 03/24/2016  . Hereditary and idiopathic peripheral neuropathy 03/24/2016  . Abnormality of gait 03/24/2016  .  Peripheral neuropathy 04/18/2015  . Hyperlipidemia 04/18/2015  . Prostate cancer ([place]) 08/24/2014  . Right inguinal hernia 10/30/2013    Willow Ora, PTA, [facility] [address] [place], [place] [address] [phone] 01/31/21, 3:55 PM   Name: Casey Fisher MRN: [id number] Date of Birth: Aug 19, 1928

## 2021-02-03 ENCOUNTER — Other Ambulatory Visit: Payer: Self-pay

## 2021-02-03 ENCOUNTER — Encounter: Payer: Self-pay | Admitting: Physical Therapy

## 2021-02-03 ENCOUNTER — Ambulatory Visit: Payer: Medicare Other | Admitting: Physical Therapy

## 2021-02-03 DIAGNOSIS — R2689 Other abnormalities of gait and mobility: Secondary | ICD-10-CM | POA: Diagnosis not present

## 2021-02-03 DIAGNOSIS — M6281 Muscle weakness (generalized): Secondary | ICD-10-CM | POA: Diagnosis not present

## 2021-02-03 DIAGNOSIS — R293 Abnormal posture: Secondary | ICD-10-CM

## 2021-02-03 DIAGNOSIS — R262 Difficulty in walking, not elsewhere classified: Secondary | ICD-10-CM | POA: Diagnosis not present

## 2021-02-03 DIAGNOSIS — R2681 Unsteadiness on feet: Secondary | ICD-10-CM | POA: Diagnosis not present

## 2021-02-03 NOTE — Therapy (Signed)
[facility] [address] [place], [place], [address] Phone: [phone]   Fax:  [phone]  Physical Therapy Treatment  Patient Details  Name: Casey Fisher MRN: [id number] Date of Birth: 26-Jul-1928 Referring Provider (PT): Margette Fast, MD   Encounter Date: 02/03/2021   PT End of Session - 02/03/21 1541    Visit Number 9    Number of Visits 17    Date for PT Re-Evaluation 04/03/21   written for 60 day cert   Authorization Type Medicare - part A and B    Progress Note Due on Visit 10    PT Start Time 1448    PT Stop Time 1531    PT Time Calculation (min) 43 min    Equipment Utilized During Treatment Gait belt    Activity Tolerance Patient tolerated treatment well    Behavior During Therapy [facility] for tasks assessed/performed           Past Medical History:  Diagnosis Date  . Abnormality of gait 03/24/2016  . Anxiety   . Cancer ([place])   . Cataract   . Depression   . Heart murmur   . Hereditary and idiopathic peripheral neuropathy 03/24/2016  . Hypertension   . Memory difficulty 03/24/2016  . Neuromuscular disorder ([place])   . Neuropathy   . Prostate cancer ([place])    remission    Past Surgical History:  Procedure Laterality Date  . CATARACT EXTRACTION Bilateral   . EYE SURGERY    . HERNIA REPAIR  1985  . INGUINAL HERNIA REPAIR Right 12/06/2013   Procedure: HERNIA REPAIR INGUINAL ADULT;  Surgeon: Merrie Roof, MD;  Location: [facility];  Service: General;  Laterality: Right;  . INSERTION OF MESH Right 12/06/2013   Procedure: INSERTION OF MESH;  Surgeon: Merrie Roof, MD;  Location: [facility] [place];  Service: General;  Laterality: Right;  . PROSTATE SURGERY      There were no vitals filed for this visit.   Subjective Assessment - 02/03/21 1452    Subjective Walked back with rollator today. No falls.    Pertinent History PMH: peripheral neuropathy, parkinsonism, HTN, prostate cancer, chronic gait disorder     Limitations Walking;Standing    How long can you walk comfortably? not very long - approx. 30' back into clinic from waiting room    Patient Stated Goals just wants to be able to walk    Currently in Pain? No/denies                             [facility] Adult PT Treatment/Exercise - 02/03/21 0001      Transfers   Transfers Sit to Stand;Stand to Sit;Stand Pivot Transfers    Sit to Stand 4: Min guard    Stand to Sit 4: Min guard    Comments from elevated mat table; x4 reps with rollator in front of pt, cues for tall posture and B knee extension, pt remains with incr B knee flexion in standing      Ambulation/Gait   Ambulation/Gait Yes    Ambulation/Gait Assistance 4: Min assist    Ambulation/Gait Assistance Details ambulated into and out of session today. cues for posture, foot clearance and step length. pt needing assist to keep rollator close at times as pt with tendency to push too far anteriorly. pt continues with more shuffled pattern and B knee flexion    Ambulation Distance (Feet) 100 Feet  x2   Assistive device Rollator    Gait Pattern Step-through pattern;Decreased step length - right;Decreased step length - left;Decreased dorsiflexion - right;Decreased dorsiflexion - left;Right foot flat;Left foot flat;Right flexed knee in stance;Left flexed knee in stance;Left genu recurvatum;Trunk flexed;Poor foot clearance - left;Poor foot clearance - right    Ambulation Surface Level;Indoor      Knee/Hip Exercises: Stretches   Other Knee/Hip Stretches discussed pt lying supine before doing quad sets in bed for a couple minutes in the morning and before going to bed at night for stretch for hamstrings/hip flexors and chest. pt verbalized understanding      Knee/Hip Exercises: Supine   Short Arc Quad Sets Strengthening;AROM;Both;1 set;10 reps;Other (comment)   on top of bolster   Bridges AROM;Strengthening;Both;Limitations;1 set;10 reps    Bridges Limitations assist to stabilize  BLE's in hooklying position verbal and manual cues for ROM, pt with limited height to lift off mat table    Other Supine Knee/Hip Exercises bent knee fall outs x10 reps each leg with red tband resistance            Access Code: [email or web address] URL: [email or web address] Date: 02/03/2021 Prepared by: Janann August  Reviewed pt's HEP:   Exercises Seated Heel Toe Raises - 1 x daily - 5 x weekly - 2 sets - 10 reps Seated March - 2 x daily - 5 x weekly - 2 sets - 10 reps - cues for full ROM  Seated Long Arc Quad - 2 x daily - 5 x weekly - 2 sets - 10 reps - cues for full ROM, tall posture and holding at end range for approx. 2-3 seconds before lowering  Seated Active Hip Flexion - 2 x daily - 5 x weekly - 2 sets - 10 reps - verbal and tactile cues for scapular retraction with return to sitting with tall posture  Supine Quadricep Sets - 2 x daily - 5 x weekly - 2 sets - 10 reps - 3 hold - performed 2 x 10 reps each side, needing initial verbal and tactile cues for technique as pt not performing at home.  Seated Hamstring Stretch - 2 x daily - 5 x weekly - 3 sets - 30 hold - initial verbal cues for technique        PT Education - 02/03/21 1541    Education Details reviewed HEP    Person(s) Educated Patient   pt's friend [name]   Methods Explanation;Demonstration;Verbal cues    Comprehension Verbalized understanding;Returned demonstration            PT Short Term Goals - 02/03/21 1542      PT SHORT TERM GOAL #1   Title Pt and caregiver will be independent with initial HEP in order to build upon functional gains made in therapy. ALL STGS DUE 01/31/21    Baseline reviewed on 02/03/21, pt needing verbal cues at times for technique and review for quad sets as pt not performing at home.    Time 4    Period Weeks    Status Partially Met    Target Date 01/31/21      PT SHORT TERM GOAL #2   Title Pt will undergo further assessment of TUG - LTG written as appropriate.    Baseline  2 minutes and 48 seconds with rollator, needing guard/min A and mod A at times for turning    Status Achieved      PT SHORT TERM GOAL #3   Title Pt will incr gait speed  with rollator to at least .8 ft/sec in order to demo improved household mobility.    Baseline 01/31/21: 0.78 ft/sec with rollator    Status Achieved      PT SHORT TERM GOAL #4   Title Pt will be able to ambulate 10' with supervision with rollator in order to demo improved household mobility.    Baseline 01/31/21: able to achieve distance, however continues to require min guard to min assist with gait    Status Partially Met      PT SHORT TERM GOAL #5   Title Pt will decr 5x sit <> stand time to 1 minute and 15 seconds or less in order to demo improved transfer efficiency and balance.    Baseline 01/31/21: 29.31 sec's from standard height chair, however pt only leg go of armrest for 2/5 reps despite max cues.    Status Achieved             PT Long Term Goals - 01/17/21 1546      PT LONG TERM GOAL #1   Title Pt and caregiver will be independent with final HEP in order to build upon functional gains made in therapy. ALL LTGS DUE 02/28/21    Time 8    Period Weeks    Status New      PT LONG TERM GOAL #2   Title Pt will decr TUG to 2 minutes and 25 seconds in order to demo decr fall risk and improved functional mobility.    Baseline 2 minutes 48 seconds    Time 8    Period Weeks    Status Revised      PT LONG TERM GOAL #3   Title Pt will decr 5x sit <> stand time to 65 seconds or less in order to demo improved transfer efficiency and balance.    Baseline 1 minute and 24 seconds from standard height chair.    Time 8    Period Weeks    Status New      PT LONG TERM GOAL #4   Title Pt will incr gait speed with rollator to at least 1.0 ft/sec in order to demo improved household mobility.    Baseline .58 ft/sec    Time 8    Period Weeks    Status New      PT LONG TERM GOAL #5   Title Pt will be able to ambulate 100'  with supervision with rollator vs. RW in order to demo improved household mobility.    Time 8    Period Weeks    Status New                 Plan - 02/03/21 1549    Clinical Impression Statement Reviewed pt's HEP with pt needing initial cues for proper technique and incr ROM. Needed review on supine quad sets as pt had not been performing at home. Pt able to ambulate in and out of clinic today with rollator with min A. Will continue to progress towards LTGs.    Personal Factors and Comorbidities Age;Comorbidity 3+;Time since onset of injury/illness/exacerbation;Past/Current Experience    Comorbidities peripheral neuropathy, parkinsonism, HTN, prostate cancer    Examination-Activity Limitations Locomotion Level;Sit;Stand;Transfers    Examination-Participation Restrictions Cleaning;Community Activity;Medication Management;Meal Prep   pt gets meals on wheels   Stability/Clinical Decision Making Evolving/Moderate complexity    Rehab Potential Good    PT Frequency 2x / week    PT Duration 8 weeks    PT Treatment/Interventions ADLs/Self  Care Home Management;DME Instruction;Functional mobility training;Therapeutic activities;Gait training;Therapeutic exercise;Balance training;Neuromuscular re-education;Patient/family education;Passive range of motion;Energy conservation;Vestibular    PT Next Visit Plan 10th visit PN. Pt may need to be wheeled back into the clinic in w/c to save time. continue with bil LE strengthening/hamstring stretching and postural work. Continued with use of Scifit. Continue with modified supine and seatded PWR! ex's.    PT Home Exercise Plan [email or web address]    Consulted and Agree with Plan of Care Patient;Family member/caregiver    Family Member Consulted pt's friend Journalist, newspaper           Patient will benefit from skilled therapeutic intervention in order to improve the following deficits and impairments:  Abnormal gait,Decreased balance,Decreased activity tolerance,Decreased  coordination,Decreased endurance,Decreased knowledge of use of DME,Decreased mobility,Decreased safety awareness,Decreased range of motion,Difficulty walking,Decreased strength,Hypomobility,Impaired flexibility,Postural dysfunction  Visit Diagnosis: Muscle weakness (generalized)  Other abnormalities of gait and mobility  Unsteadiness on feet  Difficulty in walking, not elsewhere classified  Abnormal posture     Problem List Patient Active Problem List   Diagnosis Date Noted  . Primary osteoarthritis of right knee 05/23/2020  . Low back pain 08/03/2019  . Acute pain of left shoulder 10/10/2018  . Chest pain 11/08/2017  . Memory difficulty 03/24/2016  . Hereditary and idiopathic peripheral neuropathy 03/24/2016  . Abnormality of gait 03/24/2016  . Peripheral neuropathy 04/18/2015  . Hyperlipidemia 04/18/2015  . Prostate cancer ([place]) 08/24/2014  . Right inguinal hernia 10/30/2013    Arliss Journey, PT, DPT  02/03/2021, 3:51 PM  [facility] [address] [place], [place], [address] Phone: [phone]   Fax:  [phone]  Name: AVERIE MEINER MRN: [id number] Date of Birth: [birth year]

## 2021-02-07 ENCOUNTER — Encounter: Payer: Self-pay | Admitting: Physical Therapy

## 2021-02-07 ENCOUNTER — Other Ambulatory Visit: Payer: Self-pay

## 2021-02-07 ENCOUNTER — Ambulatory Visit: Payer: Medicare Other | Admitting: Physical Therapy

## 2021-02-07 DIAGNOSIS — R262 Difficulty in walking, not elsewhere classified: Secondary | ICD-10-CM

## 2021-02-07 DIAGNOSIS — R2689 Other abnormalities of gait and mobility: Secondary | ICD-10-CM

## 2021-02-07 DIAGNOSIS — R293 Abnormal posture: Secondary | ICD-10-CM | POA: Diagnosis not present

## 2021-02-07 DIAGNOSIS — R2681 Unsteadiness on feet: Secondary | ICD-10-CM

## 2021-02-07 DIAGNOSIS — M6281 Muscle weakness (generalized): Secondary | ICD-10-CM

## 2021-02-07 NOTE — Therapy (Addendum)
[facility] [address] [place], [place], [address] Phone: [phone]   Fax:  [phone]  Physical Therapy Treatment/10th Visit Progress Note  Patient Details  Name: Casey Fisher MRN: [id number] Date of Birth: July 07, 1928 Referring Provider (PT): Margette Fast, MD  10th Visit Physical Therapy Progress Note  Dates of Reporting Period: 01/03/21 to 02/07/21   Encounter Date: 02/07/2021   PT End of Session - 02/07/21 1632    Visit Number 10    Number of Visits 17    Date for PT Re-Evaluation 04/03/21   written for 60 day cert   Authorization Type Medicare - part A and B    Progress Note Due on Visit 10    PT Start Time 1450    PT Stop Time 1530    PT Time Calculation (min) 40 min    Equipment Utilized During Treatment Gait belt    Activity Tolerance Patient tolerated treatment well    Behavior During Therapy [facility] for tasks assessed/performed           Past Medical History:  Diagnosis Date  . Abnormality of gait 03/24/2016  . Anxiety   . Cancer ([place])   . Cataract   . Depression   . Heart murmur   . Hereditary and idiopathic peripheral neuropathy 03/24/2016  . Hypertension   . Memory difficulty 03/24/2016  . Neuromuscular disorder ([place])   . Neuropathy   . Prostate cancer ([place])    remission    Past Surgical History:  Procedure Laterality Date  . CATARACT EXTRACTION Bilateral   . EYE SURGERY    . HERNIA REPAIR  1985  . INGUINAL HERNIA REPAIR Right 12/06/2013   Procedure: HERNIA REPAIR INGUINAL ADULT;  Surgeon: Merrie Roof, MD;  Location: [facility];  Service: General;  Laterality: Right;  . INSERTION OF MESH Right 12/06/2013   Procedure: INSERTION OF MESH;  Surgeon: Merrie Roof, MD;  Location: [facility];  Service: General;  Laterality: Right;  . PROSTATE SURGERY      There were no vitals filed for this visit.   Subjective Assessment - 02/07/21 1459    Subjective Walked back today with rollator. No  falls.    Pertinent History PMH: peripheral neuropathy, parkinsonism, HTN, prostate cancer, chronic gait disorder    Limitations Walking;Standing    How long can you walk comfortably? not very long - approx. 30' back into clinic from waiting room    Patient Stated Goals just wants to be able to walk    Currently in Pain? No/denies                  NMR:     Pt performs PWR! Moves in sitting position:    PWR! Up for improved posture x10 reps with verbal and demo cues for posture   PWR! Rock for improved weighshifting x10 reps B, coming down onto thigh and reaching arm up overhead   PWR! Step for improved step initiation x10 reps B - stepping over 2" obstacle and onto colorful floor disc for visual cue on how high to step/place foot. Cues for posture   Cues provided for technique and incr ROM with each exercise.     Modified standing PWR moves:   -mini squats 2 x 5 reps with BUE support on chair, cues for mini squat technique and to come to standing with tallest posture/B knee extension, pt still in crouched position - lateral side steps BUE support on chair  2 x 5 reps B, with cues for incr step length, improved with incr reps   [facility] Adult PT Treatment/Exercise - 02/07/21 1524      Transfers   Transfers Sit to Stand;Stand to [employer] Transfers    Sit to Stand 4: Min guard    Stand to Sit 4: Min guard    Transfer Cueing when standing from mat and turning to sit in w/c to be wheeled back to session, pt needing min guard/min A and cues to keep turning rollator before sitting down and to make sure back of legs are touching the chair    Comments approx. 5 reps performed throughout session from mat table, cues for tall posture once in standing      Ambulation/Gait   Ambulation/Gait Yes    Ambulation/Gait Assistance 4: Min assist    Ambulation/Gait Assistance Details ambulated into session today. cues for posture, foot clearance and step length. pt needing assist to  keep rollator close at times as pt with tendency to push too far anteriorly. pt continues with more shuffled pattern and B knee flexion. pt needing to be wheeled back at end of session    Ambulation Distance (Feet) 100 Feet    Assistive device Rollator    Gait Pattern Step-through pattern;Decreased step length - right;Decreased step length - left;Decreased dorsiflexion - right;Decreased dorsiflexion - left;Right foot flat;Left foot flat;Right flexed knee in stance;Left flexed knee in stance;Left genu recurvatum;Trunk flexed;Poor foot clearance - left;Poor foot clearance - right    Ambulation Surface Level;Indoor                   PT Short Term Goals - 02/03/21 1542      PT SHORT TERM GOAL #1   Title Pt and caregiver will be independent with initial HEP in order to build upon functional gains made in therapy. ALL STGS DUE 01/31/21    Baseline reviewed on 02/03/21, pt needing verbal cues at times for technique and review for quad sets as pt not performing at home.    Time 4    Period Weeks    Status Partially Met    Target Date 01/31/21      PT SHORT TERM GOAL #2   Title Pt will undergo further assessment of TUG - LTG written as appropriate.    Baseline 2 minutes and 48 seconds with rollator, needing guard/min A and mod A at times for turning    Status Achieved      PT SHORT TERM GOAL #3   Title Pt will incr gait speed with rollator to at least .8 ft/sec in order to demo improved household mobility.    Baseline 01/31/21: 0.78 ft/sec with rollator    Status Achieved      PT SHORT TERM GOAL #4   Title Pt will be able to ambulate 8' with supervision with rollator in order to demo improved household mobility.    Baseline 01/31/21: able to achieve distance, however continues to require min guard to min assist with gait    Status Partially Met      PT SHORT TERM GOAL #5   Title Pt will decr 5x sit <> stand time to 1 minute and 15 seconds or less in order to demo improved transfer  efficiency and balance.    Baseline 01/31/21: 29.31 sec's from standard height chair, however pt only leg go of armrest for 2/5 reps despite max cues.    Status Achieved  PT Long Term Goals - 01/17/21 1546      PT LONG TERM GOAL #1   Title Pt and caregiver will be independent with final HEP in order to build upon functional gains made in therapy. ALL LTGS DUE 02/28/21    Time 8    Period Weeks    Status New      PT LONG TERM GOAL #2   Title Pt will decr TUG to 2 minutes and 25 seconds in order to demo decr fall risk and improved functional mobility.    Baseline 2 minutes 48 seconds    Time 8    Period Weeks    Status Revised      PT LONG TERM GOAL #3   Title Pt will decr 5x sit <> stand time to 65 seconds or less in order to demo improved transfer efficiency and balance.    Baseline 1 minute and 24 seconds from standard height chair.    Time 8    Period Weeks    Status New      PT LONG TERM GOAL #4   Title Pt will incr gait speed with rollator to at least 1.0 ft/sec in order to demo improved household mobility.    Baseline .58 ft/sec    Time 8    Period Weeks    Status New      PT LONG TERM GOAL #5   Title Pt will be able to ambulate 100' with supervision with rollator vs. RW in order to demo improved household mobility.    Time 8    Period Weeks    Status New                 Plan - 02/07/21 1747    Clinical Impression Statement 10th visit progress note: STGs assessed on 01/31/21. Pt met 3 out of 5 STGs. Pt improved gait speed with rollator to .78 ft/sec (previously .10 ft/sec), still putting pt at an incr risk for falls. Pt also with improvement of 5x sit <> stand to 29.31 seconds, however pt only let go of armrest of chair for 2 reps. Pt continues to need min guard/min A for gait with rollator. Pt with tendency to push rollator too far anteriorly and continues with shuffled steps and in a crouched pattern. Will continue to progress towards LTGs.     Personal Factors and Comorbidities Age;Comorbidity 3+;Time since onset of injury/illness/exacerbation;Past/Current Experience    Comorbidities peripheral neuropathy, parkinsonism, HTN, prostate cancer    Examination-Activity Limitations Locomotion Level;Sit;Stand;Transfers    Examination-Participation Restrictions Cleaning;Community Activity;Medication Management;Meal Prep   pt gets meals on wheels   Stability/Clinical Decision Making Evolving/Moderate complexity    Rehab Potential Good    PT Frequency 2x / week    PT Duration 8 weeks    PT Treatment/Interventions ADLs/Self Care Home Management;DME Instruction;Functional mobility training;Therapeutic activities;Gait training;Therapeutic exercise;Balance training;Neuromuscular re-education;Patient/family education;Passive range of motion;Energy conservation;Vestibular    PT Next Visit Plan Pt may need to be wheeled back into the clinic in w/c to save time. continue with bil LE strengthening/hamstring stretching and postural work. Continued with use of Scifit. Continue with modified supine and seatded PWR! ex's.    PT Home Exercise Plan [id number]    Consulted and Agree with Plan of Care Patient;Family member/caregiver    Family Member Consulted pt's friend [name]           Patient will benefit from skilled therapeutic intervention in order to improve the following deficits and impairments:  Abnormal  gait,Decreased balance,Decreased activity tolerance,Decreased coordination,Decreased endurance,Decreased knowledge of use of DME,Decreased mobility,Decreased safety awareness,Decreased range of motion,Difficulty walking,Decreased strength,Hypomobility,Impaired flexibility,Postural dysfunction  Visit Diagnosis: Muscle weakness (generalized)  Other abnormalities of gait and mobility  Unsteadiness on feet  Difficulty in walking, not elsewhere classified     Problem List Patient Active Problem List   Diagnosis Date Noted  . Primary  osteoarthritis of right knee 05/23/2020  . Low back pain 08/03/2019  . Acute pain of left shoulder 10/10/2018  . Chest pain 11/08/2017  . Memory difficulty 03/24/2016  . Hereditary and idiopathic peripheral neuropathy 03/24/2016  . Abnormality of gait 03/24/2016  . Peripheral neuropathy 04/18/2015  . Hyperlipidemia 04/18/2015  . Prostate cancer ([place]) 08/24/2014  . Right inguinal hernia 10/30/2013    Arliss Journey, PT, DPT  02/07/2021, 5:53 PM  [facility] [address] [place], [place], [address] Phone: [phone]   Fax:  [phone]  Name: AGUSTUS MANE MRN: [id number] Date of Birth: 09/25/1928

## 2021-02-10 ENCOUNTER — Other Ambulatory Visit: Payer: Self-pay

## 2021-02-10 ENCOUNTER — Encounter: Payer: Self-pay | Admitting: Physical Therapy

## 2021-02-10 ENCOUNTER — Ambulatory Visit: Payer: Medicare Other | Admitting: Physical Therapy

## 2021-02-10 DIAGNOSIS — R262 Difficulty in walking, not elsewhere classified: Secondary | ICD-10-CM

## 2021-02-10 DIAGNOSIS — R2681 Unsteadiness on feet: Secondary | ICD-10-CM

## 2021-02-10 DIAGNOSIS — R293 Abnormal posture: Secondary | ICD-10-CM | POA: Diagnosis not present

## 2021-02-10 DIAGNOSIS — R2689 Other abnormalities of gait and mobility: Secondary | ICD-10-CM | POA: Diagnosis not present

## 2021-02-10 DIAGNOSIS — M6281 Muscle weakness (generalized): Secondary | ICD-10-CM

## 2021-02-11 NOTE — Therapy (Signed)
[facility] [address] [place], [place], [address] Phone: [phone]   Fax:  [phone]  Physical Therapy Treatment  Patient Details  Name: Casey Fisher MRN: [id number] Date of Birth: [birth year] Referring Provider (PT): Margette Fast, MD   Encounter Date: 02/10/2021   PT End of Session - 02/11/21 0937    Visit Number 11    Number of Visits 17    Date for PT Re-Evaluation 04/03/21   written for 60 day cert   Authorization Type Medicare - part A and B    Progress Note Due on Visit 10    PT Start Time 4536    PT Stop Time 1530    PT Time Calculation (min) 45 min    Equipment Utilized During Treatment Gait belt    Activity Tolerance Patient tolerated treatment well    Behavior During Therapy [facility] for tasks assessed/performed           Past Medical History:  Diagnosis Date  . Abnormality of gait 03/24/2016  . Anxiety   . Cancer ([place])   . Cataract   . Depression   . Heart murmur   . Hereditary and idiopathic peripheral neuropathy 03/24/2016  . Hypertension   . Memory difficulty 03/24/2016  . Neuromuscular disorder ([place])   . Neuropathy   . Prostate cancer ([place])    remission    Past Surgical History:  Procedure Laterality Date  . CATARACT EXTRACTION Bilateral   . EYE SURGERY    . HERNIA REPAIR  1985  . INGUINAL HERNIA REPAIR Right 12/06/2013   Procedure: HERNIA REPAIR INGUINAL ADULT;  Surgeon: Merrie Roof, MD;  Location: [facility];  Service: General;  Laterality: Right;  . INSERTION OF MESH Right 12/06/2013   Procedure: INSERTION OF MESH;  Surgeon: Merrie Roof, MD;  Location: [facility];  Service: General;  Laterality: Right;  . PROSTATE SURGERY      There were no vitals filed for this visit.   Subjective Assessment - 02/10/21 1455    Subjective Had a fall staturday morning. Tried to back up with his rollator. His friend [name] had to help him up.    Pertinent History PMH: peripheral neuropathy,  parkinsonism, HTN, prostate cancer, chronic gait disorder    Limitations Walking;Standing    How long can you walk comfortably? not very long - approx. 30' back into clinic from waiting room    Patient Stated Goals just wants to be able to walk    Currently in Pain? No/denies                             [facility] Adult PT Treatment/Exercise - 02/10/21 1508      Transfers   Transfers Sit to Stand;Stand to [employer] Transfers    Sit to Stand 4: Min guard    Sit to Stand Details (indicate cue type and reason) min A when coming to stand from SciFit, remainder of sit <> stands, pt performs with min guard    Stand to Sit 4: Min guard    Stand Pivot Transfers 4: Min guard    Stand Pivot Transfer Details (indicate cue type and reason) with rollator and then with RW, cues to fully back up to the mat/chair before sitting down    Comments approx. 5 reps total performed during session today, cues for B knee extension and posture in standing, pt continues with crouched posture  Ambulation/Gait   Ambulation/Gait Yes    Ambulation/Gait Assistance 4: Min assist    Ambulation/Gait Assistance Details performed gait back into clinic 100' with rollator with pt still with shuffled gait and pushing rollator too far anteriorly, needing min A. trialed RW for remainder of smaller distances throughout session with pt demonstrating improvement in staying close to device, but continues with forward flexed posture and crouched gait. discussed with pt and pt's friend that RW will likely be best option for gait (pt has one at home) as it does not get too far away from pt due to pt's recent fall. and discussed pt not ambulating at home by himself and always having someone with him    Ambulation Distance (Feet) 100 Feet   20' x 2   Assistive device Rollator;Rolling walker    Gait Pattern Decreased step length - right;Decreased step length - left;Decreased dorsiflexion - right;Decreased dorsiflexion -  left;Right foot flat;Left foot flat;Right flexed knee in stance;Left flexed knee in stance;Left genu recurvatum;Trunk flexed;Poor foot clearance - left;Poor foot clearance - right;Step-to pattern    Ambulation Surface Indoor;Level    Pre-Gait Activities alternating foot taps 2" step x5 reps B each side standing at edge of mat with RW, cues for tall posture, takes incr time for first rep      Knee/Hip Exercises: Aerobic   Other Aerobic Scifit level 1.5 x5 minutes with BLE and BUE with goal >/= 65-70 rpm for strengthening and activity tolerance.                    PT Short Term Goals - 02/03/21 1542      PT SHORT TERM GOAL #1   Title Pt and caregiver will be independent with initial HEP in order to build upon functional gains made in therapy. ALL STGS DUE 01/31/21    Baseline reviewed on 02/03/21, pt needing verbal cues at times for technique and review for quad sets as pt not performing at home.    Time 4    Period Weeks    Status Partially Met    Target Date 01/31/21      PT SHORT TERM GOAL #2   Title Pt will undergo further assessment of TUG - LTG written as appropriate.    Baseline 2 minutes and 48 seconds with rollator, needing guard/min A and mod A at times for turning    Status Achieved      PT SHORT TERM GOAL #3   Title Pt will incr gait speed with rollator to at least .8 ft/sec in order to demo improved household mobility.    Baseline 01/31/21: 0.78 ft/sec with rollator    Status Achieved      PT SHORT TERM GOAL #4   Title Pt will be able to ambulate 35' with supervision with rollator in order to demo improved household mobility.    Baseline 01/31/21: able to achieve distance, however continues to require min guard to min assist with gait    Status Partially Met      PT SHORT TERM GOAL #5   Title Pt will decr 5x sit <> stand time to 1 minute and 15 seconds or less in order to demo improved transfer efficiency and balance.    Baseline 01/31/21: 29.31 sec's from standard  height chair, however pt only leg go of armrest for 2/5 reps despite max cues.    Status Achieved             PT Long Term Goals -  01/17/21 1546      PT LONG TERM GOAL #1   Title Pt and caregiver will be independent with final HEP in order to build upon functional gains made in therapy. ALL LTGS DUE 02/28/21    Time 8    Period Weeks    Status New      PT LONG TERM GOAL #2   Title Pt will decr TUG to 2 minutes and 25 seconds in order to demo decr fall risk and improved functional mobility.    Baseline 2 minutes 48 seconds    Time 8    Period Weeks    Status Revised      PT LONG TERM GOAL #3   Title Pt will decr 5x sit <> stand time to 65 seconds or less in order to demo improved transfer efficiency and balance.    Baseline 1 minute and 24 seconds from standard height chair.    Time 8    Period Weeks    Status New      PT LONG TERM GOAL #4   Title Pt will incr gait speed with rollator to at least 1.0 ft/sec in order to demo improved household mobility.    Baseline .58 ft/sec    Time 8    Period Weeks    Status New      PT LONG TERM GOAL #5   Title Pt will be able to ambulate 100' with supervision with rollator vs. RW in order to demo improved household mobility.    Time 8    Period Weeks    Status New                 Plan - 02/11/21 2876    Clinical Impression Statement Trialed RW for gait today with pt not pushing RW too far anteriorly during gait as he does with rollator. Will continue to practice with pt, but discussed that this would be safest means of ambulation (pt has one at home). Remainder of session focused on BLE strengthening. Pt needing to be wheeled out in w/c at end of clinic due to fatigue. Will continue to progress towards LTGs.    Personal Factors and Comorbidities Age;Comorbidity 3+;Time since onset of injury/illness/exacerbation;Past/Current Experience    Comorbidities peripheral neuropathy, parkinsonism, HTN, prostate cancer     Examination-Activity Limitations Locomotion Level;Sit;Stand;Transfers    Examination-Participation Restrictions Cleaning;Community Activity;Medication Management;Meal Prep   pt gets meals on wheels   Stability/Clinical Decision Making Evolving/Moderate complexity    Rehab Potential Good    PT Frequency 2x / week    PT Duration 8 weeks    PT Treatment/Interventions ADLs/Self Care Home Management;DME Instruction;Functional mobility training;Therapeutic activities;Gait training;Therapeutic exercise;Balance training;Neuromuscular re-education;Patient/family education;Passive range of motion;Energy conservation;Vestibular    PT Next Visit Plan gait with RW. Pt may need to be wheeled back into the clinic in w/c to save time. continue with bil LE strengthening/hamstring stretching and postural work. Continued with use of Scifit. Continue with modified supine and seatded PWR! ex's.    PT Home Exercise Plan [id number]    Consulted and Agree with Plan of Care Patient;Family member/caregiver    Family Member Consulted pt's friend Journalist, newspaper           Patient will benefit from skilled therapeutic intervention in order to improve the following deficits and impairments:  Abnormal gait,Decreased balance,Decreased activity tolerance,Decreased coordination,Decreased endurance,Decreased knowledge of use of DME,Decreased mobility,Decreased safety awareness,Decreased range of motion,Difficulty walking,Decreased strength,Hypomobility,Impaired flexibility,Postural dysfunction  Visit Diagnosis: Muscle weakness (generalized)  Other  abnormalities of gait and mobility  Unsteadiness on feet  Difficulty in walking, not elsewhere classified     Problem List Patient Active Problem List   Diagnosis Date Noted  . Primary osteoarthritis of right knee 05/23/2020  . Low back pain 08/03/2019  . Acute pain of left shoulder 10/10/2018  . Chest pain 11/08/2017  . Memory difficulty 03/24/2016  . Hereditary and idiopathic  peripheral neuropathy 03/24/2016  . Abnormality of gait 03/24/2016  . Peripheral neuropathy 04/18/2015  . Hyperlipidemia 04/18/2015  . Prostate cancer ([place]) 08/24/2014  . Right inguinal hernia 10/30/2013    Arliss Journey, PT, DPT  02/11/2021, 9:40 AM  [facility] [address] [place], [place], [address] Phone: [phone]   Fax:  [phone]  Name: Casey Fisher MRN: [id number] Date of Birth: Oct 06, 1928

## 2021-02-14 ENCOUNTER — Encounter: Payer: Self-pay | Admitting: Physical Therapy

## 2021-02-14 ENCOUNTER — Other Ambulatory Visit: Payer: Self-pay

## 2021-02-14 ENCOUNTER — Ambulatory Visit: Payer: Medicare Other | Admitting: Physical Therapy

## 2021-02-14 DIAGNOSIS — R262 Difficulty in walking, not elsewhere classified: Secondary | ICD-10-CM

## 2021-02-14 DIAGNOSIS — M6281 Muscle weakness (generalized): Secondary | ICD-10-CM

## 2021-02-14 DIAGNOSIS — R2681 Unsteadiness on feet: Secondary | ICD-10-CM

## 2021-02-14 DIAGNOSIS — R2689 Other abnormalities of gait and mobility: Secondary | ICD-10-CM

## 2021-02-14 DIAGNOSIS — R293 Abnormal posture: Secondary | ICD-10-CM | POA: Diagnosis not present

## 2021-02-14 NOTE — Therapy (Addendum)
[facility] [address] [place], [place], [address] Phone: [phone]   Fax:  [phone]  Physical Therapy Treatment  Patient Details  Name: Casey Fisher MRN: [id number] Date of Birth: 02-27-1928 Referring Provider (PT): Margette Fast, MD   Encounter Date: 02/14/2021   PT End of Session - 02/14/21 1530    Visit Number 12    Number of Visits 17    Date for PT Re-Evaluation 04/03/21   written for 60 day cert   Authorization Type Medicare - part A and B    Progress Note Due on Visit 10    PT Start Time 1453   pt late to session   PT Stop Time 1530    PT Time Calculation (min) 37 min    Equipment Utilized During Treatment Gait belt    Activity Tolerance Patient tolerated treatment well    Behavior During Therapy [facility] for tasks assessed/performed           Past Medical History:  Diagnosis Date  . Abnormality of gait 03/24/2016  . Anxiety   . Cancer ([place])   . Cataract   . Depression   . Heart murmur   . Hereditary and idiopathic peripheral neuropathy 03/24/2016  . Hypertension   . Memory difficulty 03/24/2016  . Neuromuscular disorder ([place])   . Neuropathy   . Prostate cancer ([place])    remission    Past Surgical History:  Procedure Laterality Date  . CATARACT EXTRACTION Bilateral   . EYE SURGERY    . HERNIA REPAIR  1985  . INGUINAL HERNIA REPAIR Right 12/06/2013   Procedure: HERNIA REPAIR INGUINAL ADULT;  Surgeon: Merrie Roof, MD;  Location: [facility];  Service: General;  Laterality: Right;  . INSERTION OF MESH Right 12/06/2013   Procedure: INSERTION OF MESH;  Surgeon: Merrie Roof, MD;  Location: [facility];  Service: General;  Laterality: Right;  . PROSTATE SURGERY      There were no vitals filed for this visit.   Subjective Assessment - 02/14/21 1500    Subjective No changes since he was last here, has been using the RW more at home.    Pertinent History PMH: peripheral neuropathy,  parkinsonism, HTN, prostate cancer, chronic gait disorder    Limitations Walking;Standing    How long can you walk comfortably? not very long - approx. 30' back into clinic from waiting room    Patient Stated Goals just wants to be able to walk    Currently in Pain? No/denies                             [facility] Adult PT Treatment/Exercise - 02/14/21 1501      Transfers   Transfers Sit to Stand;Stand to [employer] Transfers    Sit to Stand 4: Min guard    Sit to Stand Details Visual cues for safe use of DME/AE;Verbal cues for sequencing;Verbal cues for technique    Sit to Stand Details (indicate cue type and reason) cues for proper UE placement on RW and scooting towards edge    Stand to Sit 4: Min guard    Stand to Sit Details cues to fully back up to [employer] and reach behind to sit down    Stand Pivot Transfers 4: Min guard;4: Lexicographer Details (indicate cue type and reason) with rollator/RW - needing cues to fully turn and  bring walker back closer prior to sitting down    Number of Reps --   multiple reps performed throughout session   Comments TUG shuttle: set up a standard chair approx. 10-15' away from mat table, performed down and back x2 reps, with pt needing cues to fully turn with RW before sitting down.       Exercises   Exercises Other Exercises    Other Exercises  standing at RW: 2 x 5 reps alternating step forwards to colorful floor discs for incr step length B, cues for tall posture, weight shift, and knee extension on stance leg, pt with incr forward flexed posture with incr reps      Knee/Hip Exercises: Standing   Other Standing Knee Exercises standing at RW: x5 reps mini squats, with cues for tall posture and knee extension when returning back to standing. Seated at edge of mat alternating marching x10 reps with visual cues on how high to march legs, use of 2# ankle weights.      Knee/Hip Exercises: Seated   Long Arc Quad  Strengthening;AROM;Both;10 reps;1 set    [employer] Weight 2 lbs.    Long [employer] Limitations visual cues on how high to kick up leg                  PT Education - 02/14/21 1524    Education Details using RW at all times for incr safety with gait and turns.    Person(s) Educated Patient    Methods Explanation    Comprehension Verbalized understanding            PT Short Term Goals - 02/03/21 1542      PT SHORT TERM GOAL #1   Title Pt and caregiver will be independent with initial HEP in order to build upon functional gains made in therapy. ALL STGS DUE 01/31/21    Baseline reviewed on 02/03/21, pt needing verbal cues at times for technique and review for quad sets as pt not performing at home.    Time 4    Period Weeks    Status Partially Met    Target Date 01/31/21      PT SHORT TERM GOAL #2   Title Pt will undergo further assessment of TUG - LTG written as appropriate.    Baseline 2 minutes and 48 seconds with rollator, needing guard/min A and mod A at times for turning    Status Achieved      PT SHORT TERM GOAL #3   Title Pt will incr gait speed with rollator to at least .8 ft/sec in order to demo improved household mobility.    Baseline 01/31/21: 0.78 ft/sec with rollator    Status Achieved      PT SHORT TERM GOAL #4   Title Pt will be able to ambulate 105' with supervision with rollator in order to demo improved household mobility.    Baseline 01/31/21: able to achieve distance, however continues to require min guard to min assist with gait    Status Partially Met      PT SHORT TERM GOAL #5   Title Pt will decr 5x sit <> stand time to 1 minute and 15 seconds or less in order to demo improved transfer efficiency and balance.    Baseline 01/31/21: 29.31 sec's from standard height chair, however pt only leg go of armrest for 2/5 reps despite max cues.    Status Achieved  PT Long Term Goals - 01/17/21 1546      PT LONG TERM GOAL #1   Title Pt and  caregiver will be independent with final HEP in order to build upon functional gains made in therapy. ALL LTGS DUE 02/28/21    Time 8    Period Weeks    Status New      PT LONG TERM GOAL #2   Title Pt will decr TUG to 2 minutes and 25 seconds in order to demo decr fall risk and improved functional mobility.    Baseline 2 minutes 48 seconds    Time 8    Period Weeks    Status Revised      PT LONG TERM GOAL #3   Title Pt will decr 5x sit <> stand time to 65 seconds or less in order to demo improved transfer efficiency and balance.    Baseline 1 minute and 24 seconds from standard height chair.    Time 8    Period Weeks    Status New      PT LONG TERM GOAL #4   Title Pt will incr gait speed with rollator to at least 1.0 ft/sec in order to demo improved household mobility.    Baseline .58 ft/sec    Time 8    Period Weeks    Status New      PT LONG TERM GOAL #5   Title Pt will be able to ambulate 100' with supervision with rollator vs. RW in order to demo improved household mobility.    Time 8    Period Weeks    Status New                 Plan - 02/14/21 1621    Clinical Impression Statement Pt late to session today - had pt transfer to manual clinic w/c to be pushed in and out of clinic due to time constraints. Performed the TUG shuttle today with RW to work on sit<>stands and turns. Pt still with shuffled pattern with RW, but does stay closer to RW vs. Rollator. Pt needing cues to fully turn with RW and back up to the chair prior to sitting down. Will continue to progress towards LTGs.    Personal Factors and Comorbidities Age;Comorbidity 3+;Time since onset of injury/illness/exacerbation;Past/Current Experience    Comorbidities peripheral neuropathy, parkinsonism, HTN, prostate cancer    Examination-Activity Limitations Locomotion Level;Sit;Stand;Transfers    Examination-Participation Restrictions Cleaning;Community Activity;Medication Management;Meal Prep   pt gets meals on  wheels   Stability/Clinical Decision Making Evolving/Moderate complexity    Rehab Potential Good    PT Frequency 2x / week    PT Duration 8 weeks    PT Treatment/Interventions ADLs/Self Care Home Management;DME Instruction;Functional mobility training;Therapeutic activities;Gait training;Therapeutic exercise;Balance training;Neuromuscular re-education;Patient/family education;Passive range of motion;Energy conservation;Vestibular    PT Next Visit Plan gait with RW. Pt may need to be wheeled back into the clinic in w/c to save time. continue with bil LE strengthening/hamstring stretching and postural work. Continued with use of Scifit. Continue with modified supine and seatded PWR! ex's.    PT Home Exercise Plan [id number]    Consulted and Agree with Plan of Care Patient;Family member/caregiver    Family Member Consulted pt's friend [name]           Patient will benefit from skilled therapeutic intervention in order to improve the following deficits and impairments:  Abnormal gait,Decreased balance,Decreased activity tolerance,Decreased coordination,Decreased endurance,Decreased knowledge of use of DME,Decreased mobility,Decreased safety awareness,Decreased range  of motion,Difficulty walking,Decreased strength,Hypomobility,Impaired flexibility,Postural dysfunction  Visit Diagnosis: Other abnormalities of gait and mobility  Muscle weakness (generalized)  Unsteadiness on feet  Difficulty in walking, not elsewhere classified     Problem List Patient Active Problem List   Diagnosis Date Noted  . Primary osteoarthritis of right knee 05/23/2020  . Low back pain 08/03/2019  . Acute pain of left shoulder 10/10/2018  . Chest pain 11/08/2017  . Memory difficulty 03/24/2016  . Hereditary and idiopathic peripheral neuropathy 03/24/2016  . Abnormality of gait 03/24/2016  . Peripheral neuropathy 04/18/2015  . Hyperlipidemia 04/18/2015  . Prostate cancer ([place]) 08/24/2014  . Right inguinal  hernia 10/30/2013    Arliss Journey, PT, DPT  02/14/2021, 4:22 PM  [facility] [address] [place], [place], [address] Phone: [phone]   Fax:  [phone]  Name: Casey Fisher MRN: [id number] Date of Birth: [birth year]

## 2021-02-17 ENCOUNTER — Encounter: Payer: Self-pay | Admitting: Physical Therapy

## 2021-02-17 ENCOUNTER — Other Ambulatory Visit: Payer: Self-pay

## 2021-02-17 ENCOUNTER — Ambulatory Visit: Payer: Medicare Other | Admitting: Physical Therapy

## 2021-02-17 DIAGNOSIS — R2689 Other abnormalities of gait and mobility: Secondary | ICD-10-CM

## 2021-02-17 DIAGNOSIS — M6281 Muscle weakness (generalized): Secondary | ICD-10-CM | POA: Diagnosis not present

## 2021-02-17 DIAGNOSIS — R262 Difficulty in walking, not elsewhere classified: Secondary | ICD-10-CM

## 2021-02-17 DIAGNOSIS — R2681 Unsteadiness on feet: Secondary | ICD-10-CM

## 2021-02-17 DIAGNOSIS — R293 Abnormal posture: Secondary | ICD-10-CM | POA: Diagnosis not present

## 2021-02-17 NOTE — Therapy (Signed)
[facility] [address] [place], [place], [address] Phone: [phone]   Fax:  [phone]  Physical Therapy Treatment  Patient Details  Name: Casey Fisher MRN: [id number] Date of Birth: [birth year] Referring Provider (PT): Margette Fast, MD   Encounter Date: 02/17/2021   PT End of Session - 02/17/21 1628    Visit Number 13    Number of Visits 17    Date for PT Re-Evaluation 04/03/21   written for 60 day cert   Authorization Type Medicare - part A and B    Progress Note Due on Visit 10    PT Start Time 1761    PT Stop Time 1528    PT Time Calculation (min) 45 min    Equipment Utilized During Treatment Gait belt    Activity Tolerance Patient tolerated treatment well    Behavior During Therapy [facility] for tasks assessed/performed           Past Medical History:  Diagnosis Date  . Abnormality of gait 03/24/2016  . Anxiety   . Cancer ([place])   . Cataract   . Depression   . Heart murmur   . Hereditary and idiopathic peripheral neuropathy 03/24/2016  . Hypertension   . Memory difficulty 03/24/2016  . Neuromuscular disorder ([place])   . Neuropathy   . Prostate cancer ([place])    remission    Past Surgical History:  Procedure Laterality Date  . CATARACT EXTRACTION Bilateral   . EYE SURGERY    . HERNIA REPAIR  1985  . INGUINAL HERNIA REPAIR Right 12/06/2013   Procedure: HERNIA REPAIR INGUINAL ADULT;  Surgeon: Merrie Roof, MD;  Location: [facility];  Service: General;  Laterality: Right;  . INSERTION OF MESH Right 12/06/2013   Procedure: INSERTION OF MESH;  Surgeon: Merrie Roof, MD;  Location: [facility];  Service: General;  Laterality: Right;  . PROSTATE SURGERY      There were no vitals filed for this visit.   Subjective Assessment - 02/17/21 1454    Subjective Daughter came to visit him this past weekend. Using the RW more at home.    Pertinent History PMH: peripheral neuropathy, parkinsonism, HTN, prostate  cancer, chronic gait disorder    Limitations Walking;Standing    How long can you walk comfortably? not very long - approx. 30' back into clinic from waiting room    Patient Stated Goals just wants to be able to walk    Currently in Pain? No/denies                             [facility] Adult PT Treatment/Exercise - 02/17/21 0001      Transfers   Transfers Sit to Stand;Stand to Sit;Stand Pivot Transfers    Sit to Stand 4: Min guard    Sit to Stand Details Visual cues for safe use of DME/AE;Verbal cues for sequencing;Verbal cues for technique    Sit to Stand Details (indicate cue type and reason) cues for proper UE placement on RW    Stand to Sit 4: Min guard    Stand to Sit Details cues to fully back up towards surface and reaching back      Ambulation/Gait   Ambulation/Gait Yes    Ambulation/Gait Assistance 4: Min guard;4: Min assist    Ambulation/Gait Assistance Details pt brought in RW from home - pt cued for posture and incr step length B, pt able to  perform step through pattern for a few strides going down straight paths, pt staying closer to RW throughout but remains in crouched position    Ambulation Distance (Feet) 100 Feet   x2 (into and out of clinic), 35 x 2   Assistive device Rolling walker    Gait Pattern Decreased step length - right;Decreased step length - left;Decreased dorsiflexion - right;Decreased dorsiflexion - left;Right foot flat;Left foot flat;Right flexed knee in stance;Left flexed knee in stance;Left genu recurvatum;Trunk flexed;Poor foot clearance - left;Poor foot clearance - right;Step-to pattern    Ambulation Surface Level;Indoor    Gait Comments adjusted RW height (pt brought his in frome home) and provided pt with tennis balls for easier propulsion      Knee/Hip Exercises: Aerobic   Other Aerobic Scifit level 1.5 x 6 minutes with BLE and BUE with goal >/= 65-70 rpm for strengthening and activity tolerance.      Knee/Hip Exercises: Supine    Quad Sets AROM;AAROM;Strengthening;Both;Limitations;1 set;2 sets;10 reps    Quad Sets Limitations reviewed for HEP as pt reports he had not been performing at home, verbal and tactile cues for technique. also continued to educate on lying supine with legs extended for stretch (pt reports he has been performing inconsistently)    Bridges AROM;Strengthening;Both;Limitations;1 set;10 reps    [employer] Limitations verbal and manual cues for ROM, pt with limited height to lift off mat table                  PT Education - 02/17/21 1627    Education Details discussed next week will begin to check goals and determine whether to perform re-cert vs. D/C    Person(s) Educated Patient   pt's friend [name]   Methods Explanation    Comprehension Verbalized understanding            PT Short Term Goals - 02/03/21 1542      PT SHORT TERM GOAL #1   Title Pt and caregiver will be independent with initial HEP in order to build upon functional gains made in therapy. ALL STGS DUE 01/31/21    Baseline reviewed on 02/03/21, pt needing verbal cues at times for technique and review for quad sets as pt not performing at home.    Time 4    Period Weeks    Status Partially Met    Target Date 01/31/21      PT SHORT TERM GOAL #2   Title Pt will undergo further assessment of TUG - LTG written as appropriate.    Baseline 2 minutes and 48 seconds with rollator, needing guard/min A and mod A at times for turning    Status Achieved      PT SHORT TERM GOAL #3   Title Pt will incr gait speed with rollator to at least .8 ft/sec in order to demo improved household mobility.    Baseline 01/31/21: 0.78 ft/sec with rollator    Status Achieved      PT SHORT TERM GOAL #4   Title Pt will be able to ambulate 23' with supervision with rollator in order to demo improved household mobility.    Baseline 01/31/21: able to achieve distance, however continues to require min guard to min assist with gait    Status Partially Met       PT SHORT TERM GOAL #5   Title Pt will decr 5x sit <> stand time to 1 minute and 15 seconds or less in order to demo improved transfer efficiency and balance.  Baseline 01/31/21: 29.31 sec's from standard height chair, however pt only leg go of armrest for 2/5 reps despite max cues.    Status Achieved             PT Long Term Goals - 01/17/21 1546      PT LONG TERM GOAL #1   Title Pt and caregiver will be independent with final HEP in order to build upon functional gains made in therapy. ALL LTGS DUE 02/28/21    Time 8    Period Weeks    Status New      PT LONG TERM GOAL #2   Title Pt will decr TUG to 2 minutes and 25 seconds in order to demo decr fall risk and improved functional mobility.    Baseline 2 minutes 48 seconds    Time 8    Period Weeks    Status Revised      PT LONG TERM GOAL #3   Title Pt will decr 5x sit <> stand time to 65 seconds or less in order to demo improved transfer efficiency and balance.    Baseline 1 minute and 24 seconds from standard height chair.    Time 8    Period Weeks    Status New      PT LONG TERM GOAL #4   Title Pt will incr gait speed with rollator to at least 1.0 ft/sec in order to demo improved household mobility.    Baseline .58 ft/sec    Time 8    Period Weeks    Status New      PT LONG TERM GOAL #5   Title Pt will be able to ambulate 100' with supervision with rollator vs. RW in order to demo improved household mobility.    Time 8    Period Weeks    Status New                 Plan - 02/17/21 1629    Clinical Impression Statement Pt brought in RW from home today. Ambulated into and out of clinic today with RW with min guard/min A. Pt continues to remain in crouched position, but does have an improvement in step length B and being able to stay close to RW. Remainder of session focused on BLE strengthening. Pt tolerated session well, will continue to progress towards LTGs.    Personal Factors and Comorbidities  Age;Comorbidity 3+;Time since onset of injury/illness/exacerbation;Past/Current Experience    Comorbidities peripheral neuropathy, parkinsonism, HTN, prostate cancer    Examination-Activity Limitations Locomotion Level;Sit;Stand;Transfers    Examination-Participation Restrictions Cleaning;Community Activity;Medication Management;Meal Prep   pt gets meals on wheels   Stability/Clinical Decision Making Evolving/Moderate complexity    Rehab Potential Good    PT Frequency 2x / week    PT Duration 8 weeks    PT Treatment/Interventions ADLs/Self Care Home Management;DME Instruction;Functional mobility training;Therapeutic activities;Gait training;Therapeutic exercise;Balance training;Neuromuscular re-education;Patient/family education;Passive range of motion;Energy conservation;Vestibular    PT Next Visit Plan next week will need to check goals and determine re-cert vs. D/C.gait with RW. Pt may need to be wheeled back into the clinic in w/c to save time. continue with bil LE strengthening/hamstring stretching and postural work. Continued with use of Scifit. Continue with modified supine and seatded PWR! ex's.    PT Home Exercise Plan [id number]    Consulted and Agree with Plan of Care Patient;Family member/caregiver    Family Member Consulted pt's friend Journalist, newspaper           Patient  will benefit from skilled therapeutic intervention in order to improve the following deficits and impairments:  Abnormal gait,Decreased balance,Decreased activity tolerance,Decreased coordination,Decreased endurance,Decreased knowledge of use of DME,Decreased mobility,Decreased safety awareness,Decreased range of motion,Difficulty walking,Decreased strength,Hypomobility,Impaired flexibility,Postural dysfunction  Visit Diagnosis: Other abnormalities of gait and mobility  Muscle weakness (generalized)  Unsteadiness on feet  Difficulty in walking, not elsewhere classified     Problem List Patient Active Problem List    Diagnosis Date Noted  . Primary osteoarthritis of right knee 05/23/2020  . Low back pain 08/03/2019  . Acute pain of left shoulder 10/10/2018  . Chest pain 11/08/2017  . Memory difficulty 03/24/2016  . Hereditary and idiopathic peripheral neuropathy 03/24/2016  . Abnormality of gait 03/24/2016  . Peripheral neuropathy 04/18/2015  . Hyperlipidemia 04/18/2015  . Prostate cancer ([place]) 08/24/2014  . Right inguinal hernia 10/30/2013    Arliss Journey, PT, DPT  02/17/2021, 4:31 PM  [facility] [address] [place], [place], [address] Phone: [phone]   Fax:  [phone]  Name: Casey Fisher MRN: [id number] Date of Birth: 1928/07/02

## 2021-02-21 ENCOUNTER — Ambulatory Visit: Payer: Medicare Other | Admitting: Physical Therapy

## 2021-02-21 ENCOUNTER — Encounter: Payer: Self-pay | Admitting: Physical Therapy

## 2021-02-21 ENCOUNTER — Other Ambulatory Visit: Payer: Self-pay

## 2021-02-21 ENCOUNTER — Other Ambulatory Visit: Payer: Self-pay | Admitting: Family Medicine

## 2021-02-21 DIAGNOSIS — R2689 Other abnormalities of gait and mobility: Secondary | ICD-10-CM | POA: Diagnosis not present

## 2021-02-21 DIAGNOSIS — R2681 Unsteadiness on feet: Secondary | ICD-10-CM | POA: Diagnosis not present

## 2021-02-21 DIAGNOSIS — E785 Hyperlipidemia, unspecified: Secondary | ICD-10-CM

## 2021-02-21 DIAGNOSIS — M6281 Muscle weakness (generalized): Secondary | ICD-10-CM

## 2021-02-21 DIAGNOSIS — R262 Difficulty in walking, not elsewhere classified: Secondary | ICD-10-CM

## 2021-02-21 DIAGNOSIS — R293 Abnormal posture: Secondary | ICD-10-CM | POA: Diagnosis not present

## 2021-02-21 NOTE — Therapy (Signed)
[facility] [address] [place], [place], [address] Phone: [phone]   Fax:  [phone]  Physical Therapy Treatment  Patient Details  Name: Casey Fisher MRN: [id number] Date of Birth: September 24, 1928 Referring Provider (PT): Margette Fast, MD   Encounter Date: 02/21/2021   PT End of Session - 02/21/21 1457    Visit Number 14    Number of Visits 17    Date for PT Re-Evaluation 04/03/21   written for 60 day cert   Authorization Type Medicare - part A and B    Progress Note Due on Visit 10    PT Start Time 1450    PT Stop Time 1530    PT Time Calculation (min) 40 min    Equipment Utilized During Treatment Gait belt    Activity Tolerance Patient tolerated treatment well    Behavior During Therapy [facility] for tasks assessed/performed           Past Medical History:  Diagnosis Date  . Abnormality of gait 03/24/2016  . Anxiety   . Cancer ([place])   . Cataract   . Depression   . Heart murmur   . Hereditary and idiopathic peripheral neuropathy 03/24/2016  . Hypertension   . Memory difficulty 03/24/2016  . Neuromuscular disorder ([place])   . Neuropathy   . Prostate cancer ([place])    remission    Past Surgical History:  Procedure Laterality Date  . CATARACT EXTRACTION Bilateral   . EYE SURGERY    . HERNIA REPAIR  1985  . INGUINAL HERNIA REPAIR Right 12/06/2013   Procedure: HERNIA REPAIR INGUINAL ADULT;  Surgeon: Merrie Roof, MD;  Location: [facility];  Service: General;  Laterality: Right;  . INSERTION OF MESH Right 12/06/2013   Procedure: INSERTION OF MESH;  Surgeon: Merrie Roof, MD;  Location: [facility];  Service: General;  Laterality: Right;  . PROSTATE SURGERY      There were no vitals filed for this visit.   Subjective Assessment - 02/21/21 1456    Subjective No new complaitns. No falls or pain to report.    Pertinent History PMH: peripheral neuropathy, parkinsonism, HTN, prostate cancer, chronic gait  disorder    Limitations Walking;Standing    How long can you walk comfortably? not very long - approx. 30' back into clinic from waiting room    Patient Stated Goals just wants to be able to walk    Currently in Pain? No/denies                   [facility] Adult PT Treatment/Exercise - 02/21/21 1458      Transfers   Transfers Sit to Stand;Stand to Sit;Stand Pivot Transfers    Sit to Stand With upper extremity assist;From bed;From chair/3-in-1    Stand to Sit 4: Min guard;With upper extremity assist;To bed;To chair/3-in-1      Ambulation/Gait   Ambulation/Gait Yes    Ambulation/Gait Assistance 4: Min guard;4: Min assist    Ambulation/Gait Assistance Details cues for posture, increased step length/height and for knee extension in stance. pt able to increased step length, however remained in crouched gait. No buckling noted despite the knee flexion with gait.    Ambulation Distance (Feet) 100 Feet   x2, then 75  x2   Assistive device Rolling walker    Gait Pattern Decreased step length - right;Decreased step length - left;Decreased dorsiflexion - right;Decreased dorsiflexion - left;Right foot flat;Left foot flat;Right flexed knee in stance;Left flexed  knee in stance;Left genu recurvatum;Trunk flexed;Poor foot clearance - left;Poor foot clearance - right;Step-to pattern    Ambulation Surface Level;Indoor      Knee/Hip Exercises: Aerobic   Other Aerobic Scifit level 2.0 x 6 minutes with BLE and BUE with goal >/= 65-70 rpm for strengthening and activity tolerance.      Knee/Hip Exercises: Seated   Long Arc Quad Strengthening;AROM;Both;10 reps;1 set    [employer] Weight 2 lbs.    Long [employer] Limitations visual cues on how high to kick up leg and verbal cues for slow, controlled movements               PT Short Term Goals - 02/03/21 1542      PT SHORT TERM GOAL #1   Title Pt and caregiver will be independent with initial HEP in order to build upon functional gains made in  therapy. ALL STGS DUE 01/31/21    Baseline reviewed on 02/03/21, pt needing verbal cues at times for technique and review for quad sets as pt not performing at home.    Time 4    Period Weeks    Status Partially Met    Target Date 01/31/21      PT SHORT TERM GOAL #2   Title Pt will undergo further assessment of TUG - LTG written as appropriate.    Baseline 2 minutes and 48 seconds with rollator, needing guard/min A and mod A at times for turning    Status Achieved      PT SHORT TERM GOAL #3   Title Pt will incr gait speed with rollator to at least .8 ft/sec in order to demo improved household mobility.    Baseline 01/31/21: 0.78 ft/sec with rollator    Status Achieved      PT SHORT TERM GOAL #4   Title Pt will be able to ambulate 29' with supervision with rollator in order to demo improved household mobility.    Baseline 01/31/21: able to achieve distance, however continues to require min guard to min assist with gait    Status Partially Met      PT SHORT TERM GOAL #5   Title Pt will decr 5x sit <> stand time to 1 minute and 15 seconds or less in order to demo improved transfer efficiency and balance.    Baseline 01/31/21: 29.31 sec's from standard height chair, however pt only leg go of armrest for 2/5 reps despite max cues.    Status Achieved             PT Long Term Goals - 01/17/21 1546      PT LONG TERM GOAL #1   Title Pt and caregiver will be independent with final HEP in order to build upon functional gains made in therapy. ALL LTGS DUE 02/28/21    Time 8    Period Weeks    Status New      PT LONG TERM GOAL #2   Title Pt will decr TUG to 2 minutes and 25 seconds in order to demo decr fall risk and improved functional mobility.    Baseline 2 minutes 48 seconds    Time 8    Period Weeks    Status Revised      PT LONG TERM GOAL #3   Title Pt will decr 5x sit <> stand time to 65 seconds or less in order to demo improved transfer efficiency and balance.    Baseline 1 minute and  24 seconds  from standard height chair.    Time 8    Period Weeks    Status New      PT LONG TERM GOAL #4   Title Pt will incr gait speed with rollator to at least 1.0 ft/sec in order to demo improved household mobility.    Baseline .58 ft/sec    Time 8    Period Weeks    Status New      PT LONG TERM GOAL #5   Title Pt will be able to ambulate 100' with supervision with rollator vs. RW in order to demo improved household mobility.    Time 8    Period Weeks    Status New                 Plan - 02/21/21 1457    Clinical Impression Statement Today's skilled session continued to focus on gait and strengthening with no issues noted. The pt is making steady progress toward goals and should benefit from continued PT to progress toward unmet goals.    Personal Factors and Comorbidities Age;Comorbidity 3+;Time since onset of injury/illness/exacerbation;Past/Current Experience    Comorbidities peripheral neuropathy, parkinsonism, HTN, prostate cancer    Examination-Activity Limitations Locomotion Level;Sit;Stand;Transfers    Examination-Participation Restrictions Cleaning;Community Activity;Medication Management;Meal Prep   pt gets meals on wheels   Stability/Clinical Decision Making Evolving/Moderate complexity    Rehab Potential Good    PT Frequency 2x / week    PT Duration 8 weeks    PT Treatment/Interventions ADLs/Self Care Home Management;DME Instruction;Functional mobility training;Therapeutic activities;Gait training;Therapeutic exercise;Balance training;Neuromuscular re-education;Patient/family education;Passive range of motion;Energy conservation;Vestibular    PT Next Visit Plan check goals for recert vs discharge week on 02/24/21 (goals due 02/28/21)    PT Home Exercise Plan [email or web address]    Consulted and Agree with Plan of Care Patient;Family member/caregiver    Family Member Consulted pt's friend Journalist, newspaper           Patient will benefit from skilled therapeutic intervention in  order to improve the following deficits and impairments:  Abnormal gait,Decreased balance,Decreased activity tolerance,Decreased coordination,Decreased endurance,Decreased knowledge of use of DME,Decreased mobility,Decreased safety awareness,Decreased range of motion,Difficulty walking,Decreased strength,Hypomobility,Impaired flexibility,Postural dysfunction  Visit Diagnosis: Other abnormalities of gait and mobility  Muscle weakness (generalized)  Unsteadiness on feet  Difficulty in walking, not elsewhere classified  Abnormal posture     Problem List Patient Active Problem List   Diagnosis Date Noted  . Primary osteoarthritis of right knee 05/23/2020  . Low back pain 08/03/2019  . Acute pain of left shoulder 10/10/2018  . Chest pain 11/08/2017  . Memory difficulty 03/24/2016  . Hereditary and idiopathic peripheral neuropathy 03/24/2016  . Abnormality of gait 03/24/2016  . Peripheral neuropathy 04/18/2015  . Hyperlipidemia 04/18/2015  . Prostate cancer ([place]) 08/24/2014  . Right inguinal hernia 10/30/2013    Willow Ora, PTA, [facility] [address], [place], [place] [address] [phone] 02/21/21, 5:21 PM   Name: Casey Fisher MRN: [id number] Date of Birth: 12/19/1928

## 2021-02-21 NOTE — Telephone Encounter (Signed)
Future visit in 2 months  

## 2021-02-26 ENCOUNTER — Ambulatory Visit: Payer: Medicare Other | Attending: Neurology | Admitting: Physical Therapy

## 2021-02-26 ENCOUNTER — Encounter: Payer: Self-pay | Admitting: Physical Therapy

## 2021-02-26 ENCOUNTER — Other Ambulatory Visit: Payer: Self-pay

## 2021-02-26 DIAGNOSIS — R2681 Unsteadiness on feet: Secondary | ICD-10-CM | POA: Insufficient documentation

## 2021-02-26 DIAGNOSIS — M6281 Muscle weakness (generalized): Secondary | ICD-10-CM | POA: Insufficient documentation

## 2021-02-26 DIAGNOSIS — R262 Difficulty in walking, not elsewhere classified: Secondary | ICD-10-CM | POA: Diagnosis not present

## 2021-02-26 DIAGNOSIS — R2689 Other abnormalities of gait and mobility: Secondary | ICD-10-CM | POA: Insufficient documentation

## 2021-02-26 DIAGNOSIS — R293 Abnormal posture: Secondary | ICD-10-CM | POA: Diagnosis not present

## 2021-02-26 NOTE — Therapy (Signed)
[facility] [address] [place], [place], [address] Phone: [phone]   Fax:  [phone]  Physical Therapy Treatment  Patient Details  Name: Casey Fisher MRN: [id number] Date of Birth: 13-Dec-1928 Referring Provider (PT): Margette Fast, MD   Encounter Date: 02/26/2021   PT End of Session - 02/26/21 1613    Visit Number 15    Number of Visits 17    Date for PT Re-Evaluation 04/03/21   written for 60 day cert   Authorization Type Medicare - part A and B    Progress Note Due on Visit 10    PT Start Time 1448    PT Stop Time 1530    PT Time Calculation (min) 42 min    Equipment Utilized During Treatment Gait belt    Activity Tolerance Patient tolerated treatment well    Behavior During Therapy [facility] for tasks assessed/performed           Past Medical History:  Diagnosis Date  . Abnormality of gait 03/24/2016  . Anxiety   . Cancer ([place])   . Cataract   . Depression   . Heart murmur   . Hereditary and idiopathic peripheral neuropathy 03/24/2016  . Hypertension   . Memory difficulty 03/24/2016  . Neuromuscular disorder ([place])   . Neuropathy   . Prostate cancer ([place])    remission    Past Surgical History:  Procedure Laterality Date  . CATARACT EXTRACTION Bilateral   . EYE SURGERY    . HERNIA REPAIR  1985  . INGUINAL HERNIA REPAIR Right 12/06/2013   Procedure: HERNIA REPAIR INGUINAL ADULT;  Surgeon: Merrie Roof, MD;  Location: [facility];  Service: General;  Laterality: Right;  . INSERTION OF MESH Right 12/06/2013   Procedure: INSERTION OF MESH;  Surgeon: Merrie Roof, MD;  Location: [facility];  Service: General;  Laterality: Right;  . PROSTATE SURGERY      There were no vitals filed for this visit.   Subjective Assessment - 02/26/21 1455    Subjective No falls, no changes.    Pertinent History PMH: peripheral neuropathy, parkinsonism, HTN, prostate cancer, chronic gait disorder    Limitations  Walking;Standing    How long can you walk comfortably? not very long - approx. 30' back into clinic from waiting room    Patient Stated Goals just wants to be able to walk    Currently in Pain? No/denies              [facility] PT Assessment - 02/26/21 1504      Timed Up and Go Test   TUG Comments 1 minute 57 seconds with RW                         [facility] Adult PT Treatment/Exercise - 02/26/21 1504      Transfers   Transfers Sit to Stand;Stand to Sit;Stand Pivot Transfers    Sit to Stand With upper extremity assist;From bed;From chair/3-in-1;4: Min guard    Sit to Stand Details Visual cues for safe use of DME/AE;Verbal cues for sequencing;Verbal cues for technique    Sit to Stand Details (indicate cue type and reason) cues for proper UE placement on RW and mat/chair surface    Five time sit to stand comments  26 seconds from mat table with single UE support on RW and on mat table   plus additional sit <> stands throughout session, cues for tall posture  initially in standing   Stand to Sit 4: Min guard;With upper extremity assist;To bed;To chair/3-in-1    Stand Pivot Transfers 4: Min guard;4: Min Doctor, general practice Details (indicate cue type and reason) with RW, cues to fully turn and back up to surface before reaching to sit down      Ambulation/Gait   Ambulation/Gait Yes    Ambulation/Gait Assistance 4: Min guard;4: Min assist    Ambulation/Gait Assistance Details cues for posture and increased step length B. pt able to increased step length, however remained in crouched gait. cues at times to stay close to RW    Ambulation Distance (Feet) 100 Feet   x1, 20 x 2   Assistive device Rolling walker    Gait Pattern Decreased step length - right;Decreased step length - left;Decreased dorsiflexion - right;Decreased dorsiflexion - left;Right foot flat;Left foot flat;Right flexed knee in stance;Left flexed knee in stance;Left genu recurvatum;Trunk flexed;Poor foot  clearance - left;Poor foot clearance - right;Step-to pattern    Ambulation Surface Level;Indoor               Access Code: [email or web address] URL: [email or web address] Date: 02/26/2021 Prepared by: Janann August  Reviewed bolded exercises from HEP:   Exercises Seated Heel Toe Raises - 1 x daily - 5 x weekly - 2 sets - 10 reps Seated March - 2 x daily - 5 x weekly - 2 sets - 10 reps - cues for incr ROM when marching  Seated Long Arc Quad - 2 x daily - 5 x weekly - 2 sets - 10 reps - cues to hold for a couple seconds at end range  Seated Active Hip Flexion - 2 x daily - 5 x weekly - 2 sets - 10 reps - cues for scapular retraction, pt with tendency to perform with looking up to ceiling, will benefit from further review and possibly changing to seated PWR Up   Supine Quadricep Sets - 2 x daily - 5 x weekly - 2 sets - 10 reps - 3 hold Seated Hamstring Stretch - 2 x daily - 5 x weekly - 3 sets - 30 hold    PT Education - 02/26/21 1613    Education Details progress towards goals, discussed adding 2x week for 4 weeks to finalize HEP and continue strengthening and safety with gait/transfers, reviewed HEP    Person(s) Educated Patient    Methods Explanation;Demonstration    Comprehension Verbalized understanding;Returned demonstration            PT Short Term Goals - 02/03/21 1542      PT SHORT TERM GOAL #1   Title Pt and caregiver will be independent with initial HEP in order to build upon functional gains made in therapy. ALL STGS DUE 01/31/21    Baseline reviewed on 02/03/21, pt needing verbal cues at times for technique and review for quad sets as pt not performing at home.    Time 4    Period Weeks    Status Partially Met    Target Date 01/31/21      PT SHORT TERM GOAL #2   Title Pt will undergo further assessment of TUG - LTG written as appropriate.    Baseline 2 minutes and 48 seconds with rollator, needing guard/min A and mod A at times for turning    Status  Achieved      PT SHORT TERM GOAL #3   Title Pt will incr gait speed with rollator to at least .8  ft/sec in order to demo improved household mobility.    Baseline 01/31/21: 0.78 ft/sec with rollator    Status Achieved      PT SHORT TERM GOAL #4   Title Pt will be able to ambulate 24' with supervision with rollator in order to demo improved household mobility.    Baseline 01/31/21: able to achieve distance, however continues to require min guard to min assist with gait    Status Partially Met      PT SHORT TERM GOAL #5   Title Pt will decr 5x sit <> stand time to 1 minute and 15 seconds or less in order to demo improved transfer efficiency and balance.    Baseline 01/31/21: 29.31 sec's from standard height chair, however pt only leg go of armrest for 2/5 reps despite max cues.    Status Achieved             PT Long Term Goals - 02/26/21 1502      PT LONG TERM GOAL #1   Title Pt and caregiver will be independent with final HEP in order to build upon functional gains made in therapy. ALL LTGS DUE 02/28/21    Time 8    Period Weeks    Status New      PT LONG TERM GOAL #2   Title Pt will decr TUG to 2 minutes and 25 seconds in order to demo decr fall risk and improved functional mobility.    Baseline 2 minutes 48 seconds with rollator, 1 minute 57 seconds with RW on 02/26/21    Time 8    Period Weeks    Status Achieved      PT LONG TERM GOAL #3   Title Pt will decr 5x sit <> stand time to 65 seconds or less in order to demo improved transfer efficiency and balance.    Baseline 1 minute and 24 seconds from standard height chair, 26 seconds from mat table with single UE support on RW and on mat table on 02/26/21    Time 8    Period Weeks    Status Achieved      PT LONG TERM GOAL #4   Title Pt will incr gait speed with rollator to at least 1.0 ft/sec in order to demo improved household mobility.    Baseline .58 ft/sec    Time 8    Period Weeks    Status New      PT LONG TERM GOAL #5    Title Pt will be able to ambulate 100' with supervision with rollator vs. RW in order to demo improved household mobility.    Baseline 100' with min guard/min A with RW    Time 8    Period Weeks    Status Not Met                 Plan - 02/26/21 1721    Clinical Impression Statement Began to check pt's LTGs today. Pt improved 5x sit <> stand from mat table with use of RW to 26 seconds with UE support (previously was 1 minute and 24 seconds). performed TUG today with RW with pt performing in 1 minute and 57 seconds (previously 2 minutes and 48 seconds). Began to review pt's HEP as well. Discussed with pt and pt's friend, due to progress, will plan to re-cert for an additional 2x week for 4 weeks to finalize HEP, work on strengthening, and safety with transfers/gait.    Personal Factors and Comorbidities  Age;Comorbidity 3+;Time since onset of injury/illness/exacerbation;Past/Current Experience    Comorbidities peripheral neuropathy, parkinsonism, HTN, prostate cancer    Examination-Activity Limitations Locomotion Level;Sit;Stand;Transfers    Examination-Participation Restrictions Cleaning;Community Activity;Medication Management;Meal Prep   pt gets meals on wheels   Stability/Clinical Decision Making Evolving/Moderate complexity    Rehab Potential Good    PT Frequency 2x / week    PT Duration 8 weeks    PT Treatment/Interventions ADLs/Self Care Home Management;DME Instruction;Functional mobility training;Therapeutic activities;Gait training;Therapeutic exercise;Balance training;Neuromuscular re-education;Patient/family education;Passive range of motion;Energy conservation;Vestibular    PT Next Visit Plan check remainder of goals and update HEP (might wanna add sit <> stands with charles), will need re-cert.    PT Home Exercise Plan [email or web address]    Consulted and Agree with Plan of Care Patient;Family member/caregiver    Family Member Consulted pt's friend Journalist, newspaper           Patient will  benefit from skilled therapeutic intervention in order to improve the following deficits and impairments:  Abnormal gait,Decreased balance,Decreased activity tolerance,Decreased coordination,Decreased endurance,Decreased knowledge of use of DME,Decreased mobility,Decreased safety awareness,Decreased range of motion,Difficulty walking,Decreased strength,Hypomobility,Impaired flexibility,Postural dysfunction  Visit Diagnosis: Other abnormalities of gait and mobility  Muscle weakness (generalized)  Unsteadiness on feet  Difficulty in walking, not elsewhere classified     Problem List Patient Active Problem List   Diagnosis Date Noted  . Primary osteoarthritis of right knee 05/23/2020  . Low back pain 08/03/2019  . Acute pain of left shoulder 10/10/2018  . Chest pain 11/08/2017  . Memory difficulty 03/24/2016  . Hereditary and idiopathic peripheral neuropathy 03/24/2016  . Abnormality of gait 03/24/2016  . Peripheral neuropathy 04/18/2015  . Hyperlipidemia 04/18/2015  . Prostate cancer ([place]) 08/24/2014  . Right inguinal hernia 10/30/2013    Arliss Journey , PT, DPT  02/26/2021, 5:23 PM  [facility] [address] [place], [place], [address] Phone: [phone]   Fax:  [phone]  Name: Casey Fisher MRN: [id number] Date of Birth: 1928/05/31

## 2021-02-28 ENCOUNTER — Ambulatory Visit: Payer: Medicare Other | Admitting: Physical Therapy

## 2021-02-28 ENCOUNTER — Encounter: Payer: Self-pay | Admitting: Physical Therapy

## 2021-02-28 ENCOUNTER — Other Ambulatory Visit: Payer: Self-pay

## 2021-02-28 DIAGNOSIS — M6281 Muscle weakness (generalized): Secondary | ICD-10-CM | POA: Diagnosis not present

## 2021-02-28 DIAGNOSIS — R262 Difficulty in walking, not elsewhere classified: Secondary | ICD-10-CM

## 2021-02-28 DIAGNOSIS — R293 Abnormal posture: Secondary | ICD-10-CM | POA: Diagnosis not present

## 2021-02-28 DIAGNOSIS — R2681 Unsteadiness on feet: Secondary | ICD-10-CM

## 2021-02-28 DIAGNOSIS — R2689 Other abnormalities of gait and mobility: Secondary | ICD-10-CM | POA: Diagnosis not present

## 2021-02-28 NOTE — Therapy (Addendum)
[facility] [address] [place], [place], [address] Phone: [phone]   Fax:  [phone]  Physical Therapy Treatment/Re-Cert  Patient Details  Name: Casey Fisher MRN: [id number] Date of Birth: January 04, 1928 Referring Provider (PT): Margette Fast, MD   Encounter Date: 02/28/2021    02/28/21 1454  PT Visits / Re-Eval  Visit Number 16  Number of Visits 24  Date for PT Re-Evaluation 83/66/29 (re-cert done on 04/03/64 - written for 30 day POC, cert for 60 days)  Authorization  Authorization Type Medicare - part A and B  Progress Note Due on Visit 20  PT Time Calculation  PT Start Time 1447  PT Stop Time 1530  PT Time Calculation (min) 43 min  PT - End of Session  Equipment Utilized During Treatment Gait belt  Activity Tolerance Patient tolerated treatment well  Behavior During Therapy [facility] for tasks assessed/performed    Past Medical History:  Diagnosis Date  . Abnormality of gait 03/24/2016  . Anxiety   . Cancer ([place])   . Cataract   . Depression   . Heart murmur   . Hereditary and idiopathic peripheral neuropathy 03/24/2016  . Hypertension   . Memory difficulty 03/24/2016  . Neuromuscular disorder ([place])   . Neuropathy   . Prostate cancer ([place])    remission    Past Surgical History:  Procedure Laterality Date  . CATARACT EXTRACTION Bilateral   . EYE SURGERY    . HERNIA REPAIR  1985  . INGUINAL HERNIA REPAIR Right 12/06/2013   Procedure: HERNIA REPAIR INGUINAL ADULT;  Surgeon: Merrie Roof, MD;  Location: [facility];  Service: General;  Laterality: Right;  . INSERTION OF MESH Right 12/06/2013   Procedure: INSERTION OF MESH;  Surgeon: Merrie Roof, MD;  Location: [facility];  Service: General;  Laterality: Right;  . PROSTATE SURGERY      There were no vitals filed for this visit.   Subjective Assessment - 02/28/21 1453    Subjective No new complaints. No falls or pain to report. Reports having a good  birthday earlier this week.    Patient is accompained by: --   friend, Casey Fisher   Pertinent History PMH: peripheral neuropathy, parkinsonism, HTN, prostate cancer, chronic gait disorder    Limitations Walking;Standing    How long can you walk comfortably? not very long - approx. 30' back into clinic from waiting room    Patient Stated Goals just wants to be able to walk    Currently in Pain? No/denies               03/03/21 0001  Assessment  Medical Diagnosis parkinsonism/peripheral neuropathy with gait abnormality  Referring Provider (PT) Margette Fast, MD  Prior Function  Level of Independence Independent with household mobility with device        [facility] Adult PT Treatment/Exercise - 02/28/21 1456      Transfers   Transfers Sit to Stand;Stand to [employer] Transfers    Sit to Stand 4: Min guard;With upper extremity assist;From bed;From chair/3-in-1    Sit to Stand Details Verbal cues for sequencing;Verbal cues for technique;Verbal cues for precautions/safety;Verbal cues for safe use of DME/AE    Sit to Stand Details (indicate cue type and reason) cues to scoot closer to edge of mat/chair, for hand placement and for anterior weight shifting.    Stand to Sit 4: Min guard;With upper extremity assist;To bed;To chair/3-in-1    Stand to Sit Details (indicate cue  type and reason) Verbal cues for technique;Verbal cues for sequencing;Verbal cues for precautions/safety;Verbal cues for safe use of DME/AE    Stand to Sit Details cues to turn fully and back up to the mat, then to reach back for use of arms to control descent.      Ambulation/Gait   Ambulation/Gait Yes    Ambulation/Gait Assistance 4: Min guard;4: Min assist    Ambulation/Gait Assistance Details cues for posutre, knee/hip extension and increased step length/foot clearance.    Ambulation Distance (Feet) 100 Feet   x2, 40 x2   Assistive device Rolling walker    Gait Pattern Decreased step length - right;Decreased step  length - left;Decreased dorsiflexion - right;Decreased dorsiflexion - left;Right foot flat;Left foot flat;Right flexed knee in stance;Left flexed knee in stance;Left genu recurvatum;Trunk flexed;Poor foot clearance - left;Poor foot clearance - right;Step-to pattern    Ambulation Surface Level;Indoor    Gait velocity 34.81 sec's= 0.94 ft'sec with RW, min guard assist with no chair follow needed this session.      Exercises   Exercises Other Exercises    Other Exercises  revised/advanced HEP. Refer to [place] for full details. cues needed on correct technique/form in session. Min guard assist with sit<>stands with cues/instruction to both pt and Casey Fisher on correct form/technique.      Knee/Hip Exercises: Aerobic   Other Aerobic Scifit level 2.3 x 6 minutes with BLE and BUE with goal >/= 65-70 rpm for strengthening and activity tolerance.           Issued the following to HEP this session:    Access Code: [email or web address] URL: [email or web address] Date: 02/28/2021 Prepared by: Willow Ora  Exercises Seated Hamstring Stretch - 2 x daily - 5 x weekly - 3 sets - 30 hold Seated Ankle Plantarflexion with Resistance - 1 x daily - 5 x weekly - 1 sets - 10 reps Seated March with Resistance - 1 x daily - 5 x weekly - 1 sets - 10 reps Seated Hip Abduction with Resistance - 1 x daily - 5 x weekly - 1 sets - 10 reps Seated Knee Extension with Resistance - 1 x daily - 5 x weekly - 1 sets - 10 reps Sit to Stand with Counter Support - 1 x daily - 5 x weekly - 1 sets - 5 reps      PT Education - 02/28/21 1554    Education Details updated HEP with [employer]; progress toward remaining goals    Person(s) Educated Patient;Other (comment)   friend, Casey Fisher   Methods Explanation;Demonstration;Verbal cues;Handout    Comprehension Verbalized understanding;Returned demonstration;Verbal cues required;Need further instruction            PT Short Term Goals - 02/03/21 1542      PT SHORT TERM  GOAL #1   Title Pt and caregiver will be independent with initial HEP in order to build upon functional gains made in therapy. ALL STGS DUE 01/31/21    Baseline reviewed on 02/03/21, pt needing verbal cues at times for technique and review for quad sets as pt not performing at home.    Time 4    Period Weeks    Status Partially Met    Target Date 01/31/21      PT SHORT TERM GOAL #2   Title Pt will undergo further assessment of TUG - LTG written as appropriate.    Baseline 2 minutes and 48 seconds with rollator, needing guard/min A and mod A at times for turning  Status Achieved      PT SHORT TERM GOAL #3   Title Pt will incr gait speed with rollator to at least .8 ft/sec in order to demo improved household mobility.    Baseline 01/31/21: 0.78 ft/sec with rollator    Status Achieved      PT SHORT TERM GOAL #4   Title Pt will be able to ambulate 89' with supervision with rollator in order to demo improved household mobility.    Baseline 01/31/21: able to achieve distance, however continues to require min guard to min assist with gait    Status Partially Met      PT SHORT TERM GOAL #5   Title Pt will decr 5x sit <> stand time to 1 minute and 15 seconds or less in order to demo improved transfer efficiency and balance.    Baseline 01/31/21: 29.31 sec's from standard height chair, however pt only leg go of armrest for 2/5 reps despite max cues.    Status Achieved             PT Long Term Goals - 02/28/21 1455      PT LONG TERM GOAL #1   Title Pt and caregiver will be independent with final HEP in order to build upon functional gains made in therapy. ALL LTGS DUE 02/28/21    Baseline 02/28/21: met withcurrent program. updated HEP this date.    Time --    Period --    Status Achieved      PT LONG TERM GOAL #2   Title Pt will decr TUG to 2 minutes and 25 seconds in order to demo decr fall risk and improved functional mobility.    Baseline 2 minutes 48 seconds with rollator, 1 minute 57  seconds with RW on 02/26/21    Status Achieved      PT LONG TERM GOAL #3   Title Pt will decr 5x sit <> stand time to 65 seconds or less in order to demo improved transfer efficiency and balance.    Baseline 1 minute and 24 seconds from standard height chair, 26 seconds from mat table with single UE support on RW and on mat table on 02/26/21    Status Achieved      PT LONG TERM GOAL #4   Title Pt will incr gait speed with rollator to at least 1.0 ft/sec in order to demo improved household mobility.    Baseline 02/28/21: 0.94 ft/sec with RW. improved from 0.58 ft/sec, just not to goal.    Time --    Period --    Status Partially Met      PT LONG TERM GOAL #5   Title Pt will be able to ambulate 100' with supervision with rollator vs. RW in order to demo improved household mobility.    Baseline 100' with min guard/min A with RW    Status Not Met          Revised/ongoing LTGs for re-cert:      PT Long Term Goals - 02/28/21 1455      PT LONG TERM GOAL #1   Title Pt and caregiver will be independent with final HEP in order to build upon functional gains made in therapy. ALL LTGS DUE 03/31/21    Baseline pt will benefit from ongoing additions for finalized HEP    Time 4    Period Weeks    Status On-going    Target Date 03/31/21      PT LONG TERM  GOAL #2   Title Pt will decr TUG to 1 minute and 45 seconds or less in order to demo decr fall risk and improved functional mobility.    Baseline 1 minute 57 seconds with RW on 02/26/21    Time 4    Period Weeks    Status Revised      PT LONG TERM GOAL #3   Title Pt will be able to ambulate 62' with supervision/min guard with  RW in order to demo improved household mobility.    Baseline 100' with min guard/min A with RW    Time 4    Period Weeks    Status Revised      PT LONG TERM GOAL #4   Title Pt will incr gait speed with rollator to at least 1.1 ft/sec in order to demo improved household mobility.    Baseline 02/28/21: 0.94 ft/sec with  RW. improved from 0.58 ft/sec, just not to goal.    Time 4    Period Weeks    Status Revised      PT LONG TERM GOAL #5   Title --    Baseline --    Status --              02/28/21 1454  Plan  Clinical Impression Statement Re-cert done by primary PT: Today's skilled session focused on remaining LTGs for anticipated recert. From LTG check on 02/26/21: Pt improved 5x sit <> stand from mat table with use of RW to 26 seconds with UE support (previously was 1 minute and 24 seconds). performed TUG today with RW with pt performing in 1 minute and 57 seconds (previously 2 minutes and 48 seconds). Pt improved his 10 meter gait speed to 0.94 ft/sec with RW (previously .94 ft/sec with RW). Remainder of session focused on strengthening with use of Scifit and advancement of pt's HEP. No issues noted or reported in session. The pt is making steady progress and should benefit from continued PT to p[place] toward goals of recert. LTGs revised and updated as appropriate.  Personal Factors and Comorbidities Age;Comorbidity 3+;Time since onset of injury/illness/exacerbation;Past/Current Experience  Comorbidities peripheral neuropathy, parkinsonism, HTN, prostate cancer  Examination-Activity Limitations Locomotion Level;Sit;Stand;Transfers  Examination-Participation Restrictions Cleaning;Community Activity;Medication Management;Meal Prep (pt gets meals on wheels)  Pt will benefit from skilled therapeutic intervention in order to improve on the following deficits Abnormal gait;Decreased balance;Decreased activity tolerance;Decreased coordination;Decreased endurance;Decreased knowledge of use of DME;Decreased mobility;Decreased safety awareness;Decreased range of motion;Difficulty walking;Decreased strength;Hypomobility;Impaired flexibility;Postural dysfunction  Stability/Clinical Decision Making Evolving/Moderate complexity  Rehab Potential Good  PT Frequency 2x / week  PT Duration 4 weeks  PT  Treatment/Interventions ADLs/Self Care Home Management;DME Instruction;Functional mobility training;Therapeutic activities;Gait training;Therapeutic exercise;Balance training;Neuromuscular re-education;Patient/family education;Passive range of motion;Energy conservation;Vestibular  PT Next Visit Plan Continue to work on gait with RW, standing posture and strengthening.  PT Home Exercise Plan [email or web address]  Consulted and Agree with Plan of Care Patient;Family member/caregiver  Family Member Consulted pt's friend Journalist, newspaper         Patient will benefit from skilled therapeutic intervention in order to improve the following deficits and impairments:  Abnormal gait,Decreased balance,Decreased activity tolerance,Decreased coordination,Decreased endurance,Decreased knowledge of use of DME,Decreased mobility,Decreased safety awareness,Decreased range of motion,Difficulty walking,Decreased strength,Hypomobility,Impaired flexibility,Postural dysfunction  Visit Diagnosis: Other abnormalities of gait and mobility  Muscle weakness (generalized)  Unsteadiness on feet  Difficulty in walking, not elsewhere classified     Problem List Patient Active Problem List   Diagnosis Date Noted  . Primary osteoarthritis of  right knee 05/23/2020  . Low back pain 08/03/2019  . Acute pain of left shoulder 10/10/2018  . Chest pain 11/08/2017  . Memory difficulty 03/24/2016  . Hereditary and idiopathic peripheral neuropathy 03/24/2016  . Abnormality of gait 03/24/2016  . Peripheral neuropathy 04/18/2015  . Hyperlipidemia 04/18/2015  . Prostate cancer ([place]) 08/24/2014  . Right inguinal hernia 10/30/2013    Willow Ora, PTA, [facility] [address], [place], [place] [address] [phone] 02/28/21, 4:08 PM   Name: Casey Fisher MRN: [id number] Date of Birth: 1928-10-09   Addendum for re-cert by: Janann August, PT, DPT 03/03/21 8:00 AM

## 2021-02-28 NOTE — Patient Instructions (Signed)
Access Code: [email or web address] URL: [email or web address] Date: 02/28/2021 Prepared by: Willow Ora  Exercises Seated Hamstring Stretch - 2 x daily - 5 x weekly - 3 sets - 30 hold Seated Ankle Plantarflexion with Resistance - 1 x daily - 5 x weekly - 1 sets - 10 reps Seated March with Resistance - 1 x daily - 5 x weekly - 1 sets - 10 reps Seated Hip Abduction with Resistance - 1 x daily - 5 x weekly - 1 sets - 10 reps Seated Knee Extension with Resistance - 1 x daily - 5 x weekly - 1 sets - 10 reps Sit to Stand with Counter Support - 1 x daily - 5 x weekly - 1 sets - 5 reps

## 2021-03-03 ENCOUNTER — Encounter: Payer: Self-pay | Admitting: Physical Therapy

## 2021-03-03 ENCOUNTER — Ambulatory Visit: Payer: Medicare Other | Admitting: Physical Therapy

## 2021-03-03 ENCOUNTER — Other Ambulatory Visit: Payer: Self-pay

## 2021-03-03 DIAGNOSIS — M6281 Muscle weakness (generalized): Secondary | ICD-10-CM

## 2021-03-03 DIAGNOSIS — R293 Abnormal posture: Secondary | ICD-10-CM

## 2021-03-03 DIAGNOSIS — R2681 Unsteadiness on feet: Secondary | ICD-10-CM | POA: Diagnosis not present

## 2021-03-03 DIAGNOSIS — R262 Difficulty in walking, not elsewhere classified: Secondary | ICD-10-CM | POA: Diagnosis not present

## 2021-03-03 DIAGNOSIS — R2689 Other abnormalities of gait and mobility: Secondary | ICD-10-CM | POA: Diagnosis not present

## 2021-03-03 NOTE — Therapy (Signed)
[facility] [address] [place], [place], [address] Phone: [phone]   Fax:  [phone]  Physical Therapy Treatment  Patient Details  Name: MAX NUNO MRN: [id number] Date of Birth: Dec 10, 1928 Referring Provider (PT): Margette Fast, MD   Encounter Date: 03/03/2021   PT End of Session - 03/03/21 1632    Visit Number 17    Number of Visits 24    Date for PT Re-Evaluation 22/02/54   re-cert done on 02/03/05 - written for 30 day POC, cert for 60 days   Authorization Type Medicare - part A and B    Progress Note Due on Visit 20    PT Start Time 1535    PT Stop Time 1616    PT Time Calculation (min) 41 min    Equipment Utilized During Treatment Gait belt    Activity Tolerance Patient tolerated treatment well    Behavior During Therapy [facility] for tasks assessed/performed           Past Medical History:  Diagnosis Date  . Abnormality of gait 03/24/2016  . Anxiety   . Cancer ([place])   . Cataract   . Depression   . Heart murmur   . Hereditary and idiopathic peripheral neuropathy 03/24/2016  . Hypertension   . Memory difficulty 03/24/2016  . Neuromuscular disorder ([place])   . Neuropathy   . Prostate cancer ([place])    remission    Past Surgical History:  Procedure Laterality Date  . CATARACT EXTRACTION Bilateral   . EYE SURGERY    . HERNIA REPAIR  1985  . INGUINAL HERNIA REPAIR Right 12/06/2013   Procedure: HERNIA REPAIR INGUINAL ADULT;  Surgeon: Merrie Roof, MD;  Location: [facility];  Service: General;  Laterality: Right;  . INSERTION OF MESH Right 12/06/2013   Procedure: INSERTION OF MESH;  Surgeon: Merrie Roof, MD;  Location: [facility];  Service: General;  Laterality: Right;  . PROSTATE SURGERY      There were no vitals filed for this visit.   Subjective Assessment - 03/03/21 1539    Subjective No changes. Feels good about the new exercises.    Patient is accompained by: --   friend, [name]   Pertinent  History PMH: peripheral neuropathy, parkinsonism, HTN, prostate cancer, chronic gait disorder    Limitations Walking;Standing    How long can you walk comfortably? not very long - approx. 30' back into clinic from waiting room    Patient Stated Goals just wants to be able to walk    Currently in Pain? No/denies                             [facility] Adult PT Treatment/Exercise - 03/03/21 0001      Transfers   Transfers Sit to Stand;Stand to Sit;Stand Pivot Transfers    Sit to Stand 4: Min guard;With upper extremity assist;From bed;From chair/3-in-1    Sit to Stand Details Verbal cues for sequencing;Verbal cues for technique;Verbal cues for precautions/safety;Verbal cues for safe use of DME/AE    Sit to Stand Details (indicate cue type and reason) cues for proper hand placement and scooting closer towards edge, approx. 6-7 reps performed throughout session    Stand to Sit 4: Min guard;With upper extremity assist;To bed;To chair/3-in-1    Stand to Sit Details (indicate cue type and reason) Verbal cues for technique;Verbal cues for sequencing;Verbal cues for precautions/safety;Verbal cues for safe use  of DME/AE    Stand to Sit Details cues to turn fully and back up to the mat, then to reach back for use of arms to control descent.      Ambulation/Gait   Ambulation/Gait Yes    Ambulation/Gait Assistance 4: Min guard;4: Min assist    Ambulation/Gait Assistance Details cues throughout for posture, staying closer to RW, and incr step length B, pt able to take longer steps after use of SciFit    Ambulation Distance (Feet) 100 Feet   x1, 40 x 2   Assistive device Rolling walker    Gait Pattern Decreased step length - right;Decreased step length - left;Decreased dorsiflexion - right;Decreased dorsiflexion - left;Right foot flat;Left foot flat;Right flexed knee in stance;Left flexed knee in stance;Left genu recurvatum;Trunk flexed;Poor foot clearance - left;Poor foot clearance -  right;Step-to pattern    Ambulation Surface Level;Indoor    Pre-Gait Activities attempted standing at RW with 2 colorful floor dots in front of pt, cued for pt to step foot to color for incr step length, pt unable to pick foot up back to midline, instead slides foot back on floor dot despite verbal and demo cues      Exercises   Exercises Other Exercises    Other Exercises  standing at RW: x10 reps mini squats with cues for slowed movement, mini squat, and knee extension when back in standing. seated at edge of mat: hip ABD with red tband with RLE then LLE only x10 reps B, then x10 reps with BLE with cues for 3 second hold at end range, alternating marching 2 x 10 reps B with use of red tband - cues for posture and ROM      Knee/Hip Exercises: Aerobic   Other Aerobic Scifit level 2.3 x 6 minutes with BLE and BUE with goal >/= 65-70 rpm for strengthening and activity tolerance.                    PT Short Term Goals - 03/03/21 0807      PT SHORT TERM GOAL #1   Title ALL STGS = LTGS             PT Long Term Goals - 02/28/21 1455      PT LONG TERM GOAL #1   Title Pt and caregiver will be independent with final HEP in order to build upon functional gains made in therapy. ALL LTGS DUE 03/31/21    Baseline pt will benefit from ongoing additions for finalized HEP    Time 4    Period Weeks    Status On-going    Target Date 03/31/21      PT LONG TERM GOAL #2   Title Pt will decr TUG to 1 minute and 45 seconds or less in order to demo decr fall risk and improved functional mobility.    Baseline 1 minute 57 seconds with RW on 02/26/21    Time 4    Period Weeks    Status Revised      PT LONG TERM GOAL #3   Title Pt will be able to ambulate 38' with supervision/min guard with  RW in order to demo improved household mobility.    Baseline 100' with min guard/min A with RW    Time 4    Period Weeks    Status Revised      PT LONG TERM GOAL #4   Title Pt will incr gait speed with  rollator to at least 1.1  ft/sec in order to demo improved household mobility.    Baseline 02/28/21: 0.94 ft/sec with RW. improved from 0.58 ft/sec, just not to goal.    Time 4    Period Weeks    Status Revised      PT LONG TERM GOAL #5   Title --    Baseline --    Status --                 Plan - 03/03/21 1633    Clinical Impression Statement Today's skilled session focused on gait and transfer training and BLE strengthening. Pt able to take longer steps B for a short period with RW after use of SciFit.Will continue to progress towards LTGs.    Personal Factors and Comorbidities Age;Comorbidity 3+;Time since onset of injury/illness/exacerbation;Past/Current Experience    Comorbidities peripheral neuropathy, parkinsonism, HTN, prostate cancer    Examination-Activity Limitations Locomotion Level;Sit;Stand;Transfers    Examination-Participation Restrictions Cleaning;Community Activity;Medication Management;Meal Prep   pt gets meals on wheels   Stability/Clinical Decision Making Evolving/Moderate complexity    Rehab Potential Good    PT Frequency 2x / week    PT Duration 4 weeks    PT Treatment/Interventions ADLs/Self Care Home Management;DME Instruction;Functional mobility training;Therapeutic activities;Gait training;Therapeutic exercise;Balance training;Neuromuscular re-education;Patient/family education;Passive range of motion;Energy conservation;Vestibular    PT Next Visit Plan Continue to work on gait with RW, standing posture and strengthening.    PT Home Exercise Plan [email or web address]    Consulted and Agree with Plan of Care Patient;Family member/caregiver    Family Member Consulted pt's friend Journalist, newspaper           Patient will benefit from skilled therapeutic intervention in order to improve the following deficits and impairments:  Abnormal gait,Decreased balance,Decreased activity tolerance,Decreased coordination,Decreased endurance,Decreased knowledge of use of DME,Decreased  mobility,Decreased safety awareness,Decreased range of motion,Difficulty walking,Decreased strength,Hypomobility,Impaired flexibility,Postural dysfunction  Visit Diagnosis: Other abnormalities of gait and mobility  Muscle weakness (generalized)  Unsteadiness on feet  Difficulty in walking, not elsewhere classified  Abnormal posture     Problem List Patient Active Problem List   Diagnosis Date Noted  . Primary osteoarthritis of right knee 05/23/2020  . Low back pain 08/03/2019  . Acute pain of left shoulder 10/10/2018  . Chest pain 11/08/2017  . Memory difficulty 03/24/2016  . Hereditary and idiopathic peripheral neuropathy 03/24/2016  . Abnormality of gait 03/24/2016  . Peripheral neuropathy 04/18/2015  . Hyperlipidemia 04/18/2015  . Prostate cancer ([place]) 08/24/2014  . Right inguinal hernia 10/30/2013    Arliss Journey, PT, DPT 03/03/2021, 4:34 PM  [facility] [address] [place], [place], [address] Phone: [phone]   Fax:  [phone]  Name: JAZIAH GOELLER MRN: [id number] Date of Birth: [birth year]

## 2021-03-03 NOTE — Addendum Note (Signed)
Addended by: Arliss Journey on: 03/03/2021 08:17 AM   Modules accepted: Orders

## 2021-03-07 ENCOUNTER — Ambulatory Visit: Payer: Medicare Other | Admitting: Physical Therapy

## 2021-03-07 ENCOUNTER — Encounter: Payer: Self-pay | Admitting: Physical Therapy

## 2021-03-07 ENCOUNTER — Other Ambulatory Visit: Payer: Self-pay

## 2021-03-07 DIAGNOSIS — M6281 Muscle weakness (generalized): Secondary | ICD-10-CM | POA: Diagnosis not present

## 2021-03-07 DIAGNOSIS — R2689 Other abnormalities of gait and mobility: Secondary | ICD-10-CM

## 2021-03-07 DIAGNOSIS — R2681 Unsteadiness on feet: Secondary | ICD-10-CM

## 2021-03-07 DIAGNOSIS — R262 Difficulty in walking, not elsewhere classified: Secondary | ICD-10-CM | POA: Diagnosis not present

## 2021-03-07 DIAGNOSIS — R293 Abnormal posture: Secondary | ICD-10-CM | POA: Diagnosis not present

## 2021-03-07 NOTE — Therapy (Signed)
[facility] [address] [place], [place], [address] Phone: [phone]   Fax:  [phone]  Physical Therapy Treatment  Patient Details  Name: Casey Fisher MRN: [id number] Date of Birth: Apr 10, 1928 Referring Provider (PT): Margette Fast, MD   Encounter Date: 03/07/2021   PT End of Session - 03/07/21 1548    Visit Number 18    Number of Visits 24    Date for PT Re-Evaluation 34/74/25   re-cert done on 09/01/62 - written for 30 day POC, cert for 60 days   Authorization Type Medicare - part A and B    Progress Note Due on Visit 20    PT Start Time 8756    PT Stop Time 1447    PT Time Calculation (min) 42 min    Equipment Utilized During Treatment Gait belt    Activity Tolerance Patient tolerated treatment well    Behavior During Therapy [facility] for tasks assessed/performed           Past Medical History:  Diagnosis Date  . Abnormality of gait 03/24/2016  . Anxiety   . Cancer ([place])   . Cataract   . Depression   . Heart murmur   . Hereditary and idiopathic peripheral neuropathy 03/24/2016  . Hypertension   . Memory difficulty 03/24/2016  . Neuromuscular disorder ([place])   . Neuropathy   . Prostate cancer ([place])    remission    Past Surgical History:  Procedure Laterality Date  . CATARACT EXTRACTION Bilateral   . EYE SURGERY    . HERNIA REPAIR  1985  . INGUINAL HERNIA REPAIR Right 12/06/2013   Procedure: HERNIA REPAIR INGUINAL ADULT;  Surgeon: Merrie Roof, MD;  Location: [facility];  Service: General;  Laterality: Right;  . INSERTION OF MESH Right 12/06/2013   Procedure: INSERTION OF MESH;  Surgeon: Merrie Roof, MD;  Location: [facility] [place];  Service: General;  Laterality: Right;  . PROSTATE SURGERY      There were no vitals filed for this visit.   Subjective Assessment - 03/07/21 1413    Subjective No changes since he was last here.    Patient is accompained by: --   friend, [name]   Pertinent History  PMH: peripheral neuropathy, parkinsonism, HTN, prostate cancer, chronic gait disorder    Limitations Walking;Standing    How long can you walk comfortably? not very long - approx. 30' back into clinic from waiting room    Patient Stated Goals just wants to be able to walk    Currently in Pain? No/denies                            Pt performs PWR! Moves in sitting position:    PWR! Up for improved posture x10 reps - verbal and tactile cues   PWR! Rock for improved weightshifting - x7 reps B, verbal and visual cue on how high to reach hands  PWR! Step for improved step initiation x10 reps B stepping over 4" beam for incr step height       [facility] Adult PT Treatment/Exercise - 03/07/21 1442      Transfers   Transfers Sit to Stand;Stand to Sit;Stand Pivot Transfers    Sit to Stand 4: Min guard;With upper extremity assist;From bed;From chair/3-in-1    Sit to Stand Details Verbal cues for sequencing;Verbal cues for technique;Verbal cues for precautions/safety;Verbal cues for safe use of DME/AE  Sit to Stand Details (indicate cue type and reason) cues for hand placement    Stand to Sit 4: Min guard;With upper extremity assist;To bed;To chair/3-in-1    Stand to Sit Details (indicate cue type and reason) Verbal cues for technique;Verbal cues for sequencing;Verbal cues for precautions/safety;Verbal cues for safe use of DME/AE    Stand to Sit Details cues to fully reach back    Stand Pivot Transfers 4: Min guard    Stand Pivot Transfer Details (indicate cue type and reason) with RW from mat table > w/c      Ambulation/Gait   Ambulation/Gait Yes    Ambulation/Gait Assistance 4: Min guard;4: Min assist    Ambulation/Gait Assistance Details cues for step length B, staying close to RW.discussed with pt and pt's friend [name], would likely benefit from an order for a transport chair for incr ease in and out of appts    Ambulation Distance (Feet) 100 Feet    Assistive device  Rolling walker    Gait Pattern Decreased step length - right;Decreased step length - left;Decreased dorsiflexion - right;Decreased dorsiflexion - left;Right foot flat;Left foot flat;Right flexed knee in stance;Left flexed knee in stance;Left genu recurvatum;Trunk flexed;Poor foot clearance - left;Poor foot clearance - right;Step-to pattern    Ambulation Surface Level;Indoor      Exercises   Exercises Other Exercises    Other Exercises  hookyling B hip ABD with green theraband x10 reps, x10 reps bridging - verbal and tactile cues for ROM, heel slides with red physioball under legs x10 reps B - with hold out in knee extension for an additional stretch                    PT Short Term Goals - 03/03/21 0807      PT SHORT TERM GOAL #1   Title ALL STGS = LTGS             PT Long Term Goals - 02/28/21 1455      PT LONG TERM GOAL #1   Title Pt and caregiver will be independent with final HEP in order to build upon functional gains made in therapy. ALL LTGS DUE 03/31/21    Baseline pt will benefit from ongoing additions for finalized HEP    Time 4    Period Weeks    Status On-going    Target Date 03/31/21      PT LONG TERM GOAL #2   Title Pt will decr TUG to 1 minute and 45 seconds or less in order to demo decr fall risk and improved functional mobility.    Baseline 1 minute 57 seconds with RW on 02/26/21    Time 4    Period Weeks    Status Revised      PT LONG TERM GOAL #3   Title Pt will be able to ambulate 46' with supervision/min guard with  RW in order to demo improved household mobility.    Baseline 100' with min guard/min A with RW    Time 4    Period Weeks    Status Revised      PT LONG TERM GOAL #4   Title Pt will incr gait speed with rollator to at least 1.1 ft/sec in order to demo improved household mobility.    Baseline 02/28/21: 0.94 ft/sec with RW. improved from 0.58 ft/sec, just not to goal.    Time 4    Period Weeks    Status Revised  PT LONG TERM  GOAL #5   Title --    Baseline --    Status --                 Plan - 03/07/21 1549    Clinical Impression Statement Today's skilled session focused on gait training with RW into session, BLE and postural strengthening in supine position and with use of PWR moves. Pt tolerated session well. Pt too tired to walk out into waiting room with RW at end of session, pt continues to need to be wheeled out in manual w/c. Pt has been consistently walking into clinic with RW. Will continue to progress towards LTGs.    Personal Factors and Comorbidities Age;Comorbidity 3+;Time since onset of injury/illness/exacerbation;Past/Current Experience    Comorbidities peripheral neuropathy, parkinsonism, HTN, prostate cancer    Examination-Activity Limitations Locomotion Level;Sit;Stand;Transfers    Examination-Participation Restrictions Cleaning;Community Activity;Medication Management;Meal Prep   pt gets meals on wheels   Stability/Clinical Decision Making Evolving/Moderate complexity    Rehab Potential Good    PT Frequency 2x / week    PT Duration 4 weeks    PT Treatment/Interventions ADLs/Self Care Home Management;DME Instruction;Functional mobility training;Therapeutic activities;Gait training;Therapeutic exercise;Balance training;Neuromuscular re-education;Patient/family education;Passive range of motion;Energy conservation;Vestibular    PT Next Visit Plan Continue to work on gait with RW, standing posture and strengthening.    PT Home Exercise Plan [email or web address]    Consulted and Agree with Plan of Care Patient;Family member/caregiver    Family Member Consulted pt's friend Journalist, newspaper           Patient will benefit from skilled therapeutic intervention in order to improve the following deficits and impairments:  Abnormal gait,Decreased balance,Decreased activity tolerance,Decreased coordination,Decreased endurance,Decreased knowledge of use of DME,Decreased mobility,Decreased safety awareness,Decreased  range of motion,Difficulty walking,Decreased strength,Hypomobility,Impaired flexibility,Postural dysfunction  Visit Diagnosis: Other abnormalities of gait and mobility  Muscle weakness (generalized)  Difficulty in walking, not elsewhere classified  Abnormal posture  Unsteadiness on feet     Problem List Patient Active Problem List   Diagnosis Date Noted  . Primary osteoarthritis of right knee 05/23/2020  . Low back pain 08/03/2019  . Acute pain of left shoulder 10/10/2018  . Chest pain 11/08/2017  . Memory difficulty 03/24/2016  . Hereditary and idiopathic peripheral neuropathy 03/24/2016  . Abnormality of gait 03/24/2016  . Peripheral neuropathy 04/18/2015  . Hyperlipidemia 04/18/2015  . Prostate cancer ([place]) 08/24/2014  . Right inguinal hernia 10/30/2013    Arliss Journey, PT, DPT  03/07/2021, 3:51 PM  [facility] [address] [place], [place], [address] Phone: [phone]   Fax:  [phone]  Name: JOSSUE RUBENSTEIN MRN: [id number] Date of Birth: 02-Mar-1928

## 2021-03-10 ENCOUNTER — Other Ambulatory Visit: Payer: Self-pay

## 2021-03-10 ENCOUNTER — Ambulatory Visit: Payer: Medicare Other | Admitting: Physical Therapy

## 2021-03-10 ENCOUNTER — Encounter: Payer: Self-pay | Admitting: Physical Therapy

## 2021-03-10 DIAGNOSIS — R293 Abnormal posture: Secondary | ICD-10-CM

## 2021-03-10 DIAGNOSIS — R262 Difficulty in walking, not elsewhere classified: Secondary | ICD-10-CM

## 2021-03-10 DIAGNOSIS — R2681 Unsteadiness on feet: Secondary | ICD-10-CM | POA: Diagnosis not present

## 2021-03-10 DIAGNOSIS — R2689 Other abnormalities of gait and mobility: Secondary | ICD-10-CM

## 2021-03-10 DIAGNOSIS — M6281 Muscle weakness (generalized): Secondary | ICD-10-CM

## 2021-03-10 NOTE — Therapy (Signed)
[facility] [address] [place], [place], [address] Phone: [phone]   Fax:  [phone]  Physical Therapy Treatment  Patient Details  Name: Casey Fisher MRN: [id number] Date of Birth: 03/06/1928 Referring Provider (PT): Margette Fast, MD   Encounter Date: 03/10/2021   PT End of Session - 03/10/21 1651    Visit Number 19    Number of Visits 24    Date for PT Re-Evaluation 61/44/31   re-cert done on 05/01/99 - written for 30 day POC, cert for 60 days   Authorization Type Medicare - part A and B    Progress Note Due on Visit 20    PT Start Time 1456   pt late to session   PT Stop Time 1531    PT Time Calculation (min) 35 min    Equipment Utilized During Treatment Gait belt    Activity Tolerance Patient tolerated treatment well    Behavior During Therapy [facility] for tasks assessed/performed           Past Medical History:  Diagnosis Date  . Abnormality of gait 03/24/2016  . Anxiety   . Cancer ([place])   . Cataract   . Depression   . Heart murmur   . Hereditary and idiopathic peripheral neuropathy 03/24/2016  . Hypertension   . Memory difficulty 03/24/2016  . Neuromuscular disorder ([place])   . Neuropathy   . Prostate cancer ([place])    remission    Past Surgical History:  Procedure Laterality Date  . CATARACT EXTRACTION Bilateral   . EYE SURGERY    . HERNIA REPAIR  1985  . INGUINAL HERNIA REPAIR Right 12/06/2013   Procedure: HERNIA REPAIR INGUINAL ADULT;  Surgeon: Merrie Roof, MD;  Location: [facility];  Service: General;  Laterality: Right;  . INSERTION OF MESH Right 12/06/2013   Procedure: INSERTION OF MESH;  Surgeon: Merrie Roof, MD;  Location: [facility];  Service: General;  Laterality: Right;  . PROSTATE SURGERY      There were no vitals filed for this visit.   Subjective Assessment - 03/10/21 1500    Subjective No falls, no changes since he was last here.    Patient is accompained by: --   friend,  [name]   Pertinent History PMH: peripheral neuropathy, parkinsonism, HTN, prostate cancer, chronic gait disorder    Limitations Walking;Standing    How long can you walk comfortably? not very long - approx. 30' back into clinic from waiting room    Patient Stated Goals just wants to be able to walk    Currently in Pain? No/denies                             [facility] Adult PT Treatment/Exercise - 03/10/21 1515      Transfers   Transfers Sit to Stand;Stand to [employer] Transfers    Sit to Stand 4: Min guard;With upper extremity assist;From bed;From chair/3-in-1    Sit to Stand Details Verbal cues for sequencing;Verbal cues for technique;Verbal cues for precautions/safety;Verbal cues for safe use of DME/AE    Sit to Stand Details (indicate cue type and reason) cues for hand placement    Stand to Sit 4: Min guard;With upper extremity assist;To bed;To chair/3-in-1    Stand to Sit Details (indicate cue type and reason) Verbal cues for technique;Verbal cues for sequencing;Verbal cues for precautions/safety;Verbal cues for safe use of DME/AE    Stand  Pivot Transfers 4: Min guard    Stand Pivot Transfer Details (indicate cue type and reason) with RW from mat table > clinic w/c    Number of Reps 1 set;10 reps   from mat table     Ambulation/Gait   Ambulation/Gait Yes    Ambulation/Gait Assistance 4: Min guard;4: Min assist    Ambulation/Gait Assistance Details cues for step length, posture, and staying close to RW. pt remainds in crouched position    Ambulation Distance (Feet) 100 Feet    Assistive device Rolling walker    Gait Pattern Decreased step length - right;Decreased step length - left;Decreased dorsiflexion - right;Decreased dorsiflexion - left;Right foot flat;Left foot flat;Right flexed knee in stance;Left flexed knee in stance;Left genu recurvatum;Trunk flexed;Poor foot clearance - left;Poor foot clearance - right;Step-to pattern    Ambulation Surface Level;Indoor       Neuro Re-ed    Neuro Re-ed Details  standing at counter with clinic w/c behind pt: wide BOS with reaching superiorly towards sticky note target for posture/weight shifting, 2 x 5 reps B, with BUE support, wide BOS lateral weight shifting x10 reps      Knee/Hip Exercises: Stretches   Passive Hamstring Stretch Both;3 reps;30 seconds    Passive Hamstring Stretch Limitations with pt supine on mat table      Knee/Hip Exercises: Supine   Quad Sets AROM;AAROM;Strengthening;Both;Limitations;1 set;2 sets;10 reps    Quad Sets Limitations verbal and tactile cues for technique    Bridges AROM;Strengthening;Both;Limitations;1 set;10 reps    Bridges Limitations cues for full ROM                    PT Short Term Goals - 03/03/21 0807      PT SHORT TERM GOAL #1   Title ALL STGS = LTGS             PT Long Term Goals - 02/28/21 1455      PT LONG TERM GOAL #1   Title Pt and caregiver will be independent with final HEP in order to build upon functional gains made in therapy. ALL LTGS DUE 03/31/21    Baseline pt will benefit from ongoing additions for finalized HEP    Time 4    Period Weeks    Status On-going    Target Date 03/31/21      PT LONG TERM GOAL #2   Title Pt will decr TUG to 1 minute and 45 seconds or less in order to demo decr fall risk and improved functional mobility.    Baseline 1 minute 57 seconds with RW on 02/26/21    Time 4    Period Weeks    Status Revised      PT LONG TERM GOAL #3   Title Pt will be able to ambulate 47' with supervision/min guard with  RW in order to demo improved household mobility.    Baseline 100' with min guard/min A with RW    Time 4    Period Weeks    Status Revised      PT LONG TERM GOAL #4   Title Pt will incr gait speed with rollator to at least 1.1 ft/sec in order to demo improved household mobility.    Baseline 02/28/21: 0.94 ft/sec with RW. improved from 0.58 ft/sec, just not to goal.    Time 4    Period Weeks    Status  Revised      PT LONG TERM GOAL #5   Title --  Baseline --    Status --                 Plan - 03/10/21 1655    Clinical Impression Statement Pt ambulated into therapy session with RW -responded better to cues today for larger step length, but remains in crouched position with forward flexed posture. Remainder of session focused on BLE strengthening/stretching and standing balance at countertop. Pt tolerated session well, will continue to progress towards LTGs.    Personal Factors and Comorbidities Age;Comorbidity 3+;Time since onset of injury/illness/exacerbation;Past/Current Experience    Comorbidities peripheral neuropathy, parkinsonism, HTN, prostate cancer    Examination-Activity Limitations Locomotion Level;Sit;Stand;Transfers    Examination-Participation Restrictions Cleaning;Community Activity;Medication Management;Meal Prep   pt gets meals on wheels   Stability/Clinical Decision Making Evolving/Moderate complexity    Rehab Potential Good    PT Frequency 2x / week    PT Duration 4 weeks    PT Treatment/Interventions ADLs/Self Care Home Management;DME Instruction;Functional mobility training;Therapeutic activities;Gait training;Therapeutic exercise;Balance training;Neuromuscular re-education;Patient/family education;Passive range of motion;Energy conservation;Vestibular    PT Next Visit Plan needs 10th visit PN. Continue to work on gait with RW, standing posture and strengthening. possible transport chair order??    PT Home Exercise Plan [id number]    Consulted and Agree with Plan of Care Patient;Family member/caregiver    Family Member Consulted pt's friend Journalist, newspaper           Patient will benefit from skilled therapeutic intervention in order to improve the following deficits and impairments:  Abnormal gait,Decreased balance,Decreased activity tolerance,Decreased coordination,Decreased endurance,Decreased knowledge of use of DME,Decreased mobility,Decreased safety  awareness,Decreased range of motion,Difficulty walking,Decreased strength,Hypomobility,Impaired flexibility,Postural dysfunction  Visit Diagnosis: Other abnormalities of gait and mobility  Muscle weakness (generalized)  Difficulty in walking, not elsewhere classified  Abnormal posture     Problem List Patient Active Problem List   Diagnosis Date Noted  . Primary osteoarthritis of right knee 05/23/2020  . Low back pain 08/03/2019  . Acute pain of left shoulder 10/10/2018  . Chest pain 11/08/2017  . Memory difficulty 03/24/2016  . Hereditary and idiopathic peripheral neuropathy 03/24/2016  . Abnormality of gait 03/24/2016  . Peripheral neuropathy 04/18/2015  . Hyperlipidemia 04/18/2015  . Prostate cancer ([place]) 08/24/2014  . Right inguinal hernia 10/30/2013    Arliss Journey, PT, DPT 03/10/2021, 4:57 PM  [facility] [address] [place], [place], [address] Phone: [phone]   Fax:  [phone]  Name: NAAMAN CURRO MRN: [id number] Date of Birth: 1928-05-02

## 2021-03-12 ENCOUNTER — Other Ambulatory Visit: Payer: Self-pay | Admitting: *Deleted

## 2021-03-12 MED ORDER — DONEPEZIL HCL 5 MG PO TABS
5.0000 mg | ORAL_TABLET | Freq: Every day | ORAL | 1 refills | Status: DC
Start: 2021-03-12 — End: 2021-09-24

## 2021-03-14 ENCOUNTER — Other Ambulatory Visit: Payer: Self-pay

## 2021-03-14 ENCOUNTER — Encounter: Payer: Self-pay | Admitting: Physical Therapy

## 2021-03-14 ENCOUNTER — Ambulatory Visit: Payer: Medicare Other | Admitting: Physical Therapy

## 2021-03-14 DIAGNOSIS — R2689 Other abnormalities of gait and mobility: Secondary | ICD-10-CM | POA: Diagnosis not present

## 2021-03-14 DIAGNOSIS — R262 Difficulty in walking, not elsewhere classified: Secondary | ICD-10-CM | POA: Diagnosis not present

## 2021-03-14 DIAGNOSIS — M6281 Muscle weakness (generalized): Secondary | ICD-10-CM | POA: Diagnosis not present

## 2021-03-14 DIAGNOSIS — R293 Abnormal posture: Secondary | ICD-10-CM | POA: Diagnosis not present

## 2021-03-14 DIAGNOSIS — R2681 Unsteadiness on feet: Secondary | ICD-10-CM | POA: Diagnosis not present

## 2021-03-14 NOTE — Therapy (Addendum)
[facility] [address] [place], [place], [address] Phone: [phone]   Fax:  [phone]  Physical Therapy Treatment/10th visit Progress Note  Patient Details  Name: Casey Fisher MRN: [id number] Date of Birth: 1928-08-01 Referring Provider (PT): Margette Fast, MD   Encounter Date: 03/14/2021   PT End of Session - 03/14/21 1635    Visit Number 20    Number of Visits 24    Date for PT Re-Evaluation 29/79/89   re-cert done on 01/28/18 - written for 30 day POC, cert for 60 days   Authorization Type Medicare - part A and B    Progress Note Due on Visit 30    PT Start Time 1402    PT Stop Time 1444    PT Time Calculation (min) 42 min    Equipment Utilized During Treatment Gait belt    Activity Tolerance Patient tolerated treatment well    Behavior During Therapy [facility] for tasks assessed/performed           Past Medical History:  Diagnosis Date  . Abnormality of gait 03/24/2016  . Anxiety   . Cancer ([place])   . Cataract   . Depression   . Heart murmur   . Hereditary and idiopathic peripheral neuropathy 03/24/2016  . Hypertension   . Memory difficulty 03/24/2016  . Neuromuscular disorder ([place])   . Neuropathy   . Prostate cancer ([place])    remission    Past Surgical History:  Procedure Laterality Date  . CATARACT EXTRACTION Bilateral   . EYE SURGERY    . HERNIA REPAIR  1985  . INGUINAL HERNIA REPAIR Right 12/06/2013   Procedure: HERNIA REPAIR INGUINAL ADULT;  Surgeon: Merrie Roof, MD;  Location: [facility];  Service: General;  Laterality: Right;  . INSERTION OF MESH Right 12/06/2013   Procedure: INSERTION OF MESH;  Surgeon: Merrie Roof, MD;  Location: [facility];  Service: General;  Laterality: Right;  . PROSTATE SURGERY      There were no vitals filed for this visit.   Subjective Assessment - 03/14/21 1406    Subjective No new complaints. No falls    Pertinent History PMH: peripheral neuropathy,  parkinsonism, HTN, prostate cancer, chronic gait disorder    Limitations Walking;Standing    How long can you walk comfortably? not very long - approx. 30' back into clinic from waiting room    Currently in Pain? No/denies              [facility] Adult PT Treatment/Exercise - 03/14/21 1406      Transfers   Transfers Sit to Stand;Stand to [employer] Transfers    Sit to Stand 4: Min guard;With upper extremity assist;From bed;From chair/3-in-1    Stand to Sit 4: Min guard;With upper extremity assist;To bed;To chair/3-in-1    Number of Reps 1 set;10 reps    Transfer Cueing cues for tall posture/standing each rep. UE assist on mat to stand/sit controlled and on RW in standing for balance/stability assitance.      Ambulation/Gait   Ambulation/Gait Yes    Ambulation/Gait Assistance 4: Min guard;4: Min assist    Ambulation/Gait Assistance Details cues for tall posture, to extend knees and for increased step length with gait. Pt able to improve posture and step length with cues, however maintained a crouched gait pattern.    Ambulation Distance (Feet) 100 Feet   x2,  25 x2   Assistive device Rolling walker    Gait Pattern  Decreased step length - right;Decreased step length - left;Decreased dorsiflexion - right;Decreased dorsiflexion - left;Right foot flat;Left foot flat;Right flexed knee in stance;Left flexed knee in stance;Left genu recurvatum;Trunk flexed;Poor foot clearance - left;Poor foot clearance - right;Step-to pattern    Ambulation Surface Level;Indoor      Knee/Hip Exercises: Stretches   Passive Hamstring Stretch Both;3 reps;30 seconds    Passive Hamstring Stretch Limitations seated at edge of mat with foot propped on 6 inch stool with gentle overpressure at knee with each rep.      Knee/Hip Exercises: Aerobic   Other Aerobic Scifit level 2.5 x 6 minutes with BLE and BUE with goal >/= 65-70 rpm for strengthening and activity tolerance.      Knee/Hip Exercises: Seated   Long Arc  Quad AROM;Strengthening;Both;2 sets;10 reps;Weights;Limitations    Long Arc Quad Weight 2 lbs.    Long [employer] Limitations use of PTA hand as a target, short hold at top with cues for slow lowering of foot back to floor.    Marching AROM;Strengthening;Both;2 sets;10 reps;Weights;Limitations    Marching Limitations PTA hands as targets to get increased hip flexion with each rep.    Marching Weights 2 lbs.                    PT Short Term Goals - 03/03/21 0807      PT SHORT TERM GOAL #1   Title ALL STGS = LTGS             PT Long Term Goals - 02/28/21 1455      PT LONG TERM GOAL #1   Title Pt and caregiver will be independent with final HEP in order to build upon functional gains made in therapy. ALL LTGS DUE 03/31/21    Baseline pt will benefit from ongoing additions for finalized HEP    Time 4    Period Weeks    Status On-going    Target Date 03/31/21      PT LONG TERM GOAL #2   Title Pt will decr TUG to 1 minute and 45 seconds or less in order to demo decr fall risk and improved functional mobility.    Baseline 1 minute 57 seconds with RW on 02/26/21    Time 4    Period Weeks    Status Revised      PT LONG TERM GOAL #3   Title Pt will be able to ambulate 39' with supervision/min guard with  RW in order to demo improved household mobility.    Baseline 100' with min guard/min A with RW    Time 4    Period Weeks    Status Revised      PT LONG TERM GOAL #4   Title Pt will incr gait speed with rollator to at least 1.1 ft/sec in order to demo improved household mobility.    Baseline 02/28/21: 0.94 ft/sec with RW. improved from 0.58 ft/sec, just not to goal.    Time 4    Period Weeks    Status Revised      PT LONG TERM GOAL #5   Title --    Baseline --    Status --                 Plan - 03/14/21 1636    Clinical Impression Statement 10th visit progress note: Today's skilled session continued to focus on gait with RW with emphasis on tall posture and  knee extension with minimal improvement in  knee flexion noted. Also continued to work on LE strengthening and hamstring stretching for improved knee extension with standing/gait. No issues noted or repored in session. The pt is progressing toward goals and should benefit from continued PT to progress toward LTGs. Pt continues to walk with a crouched gait pattern with RW - able to maintain more erect posture for a few steps, but then reverts back to more forward flexed posture.    Personal Factors and Comorbidities Age;Comorbidity 3+;Time since onset of injury/illness/exacerbation;Past/Current Experience    Comorbidities peripheral neuropathy, parkinsonism, HTN, prostate cancer    Examination-Activity Limitations Locomotion Level;Sit;Stand;Transfers    Examination-Participation Restrictions Cleaning;Community Activity;Medication Management;Meal Prep   pt gets meals on wheels   Stability/Clinical Decision Making Evolving/Moderate complexity    Rehab Potential Good    PT Frequency 2x / week    PT Duration 4 weeks    PT Treatment/Interventions ADLs/Self Care Home Management;DME Instruction;Functional mobility training;Therapeutic activities;Gait training;Therapeutic exercise;Balance training;Neuromuscular re-education;Patient/family education;Passive range of motion;Energy conservation;Vestibular    PT Next Visit Plan Continue to work on gait with RW, standing posture and strengthening. possible transport chair order??    PT Home Exercise Plan [email or web address]    Consulted and Agree with Plan of Care Patient;Family member/caregiver    Family Member Consulted pt's friend Journalist, newspaper           Patient will benefit from skilled therapeutic intervention in order to improve the following deficits and impairments:  Abnormal gait,Decreased balance,Decreased activity tolerance,Decreased coordination,Decreased endurance,Decreased knowledge of use of DME,Decreased mobility,Decreased safety awareness,Decreased range of  motion,Difficulty walking,Decreased strength,Hypomobility,Impaired flexibility,Postural dysfunction  Visit Diagnosis: Other abnormalities of gait and mobility  Muscle weakness (generalized)  Difficulty in walking, not elsewhere classified  Abnormal posture  Unsteadiness on feet     Problem List Patient Active Problem List   Diagnosis Date Noted  . Primary osteoarthritis of right knee 05/23/2020  . Low back pain 08/03/2019  . Acute pain of left shoulder 10/10/2018  . Chest pain 11/08/2017  . Memory difficulty 03/24/2016  . Hereditary and idiopathic peripheral neuropathy 03/24/2016  . Abnormality of gait 03/24/2016  . Peripheral neuropathy 04/18/2015  . Hyperlipidemia 04/18/2015  . Prostate cancer ([place]) 08/24/2014  . Right inguinal hernia 10/30/2013    Willow Ora, PTA, [facility] [address] [place], [place] [address] [phone] 03/14/21, 4:42 PM   Name: Casey Fisher MRN: [id number] Date of Birth: 25-May-1928   Janann August, PT, DPT 04/09/21 10:43 AM

## 2021-03-17 ENCOUNTER — Ambulatory Visit: Payer: Medicare Other | Admitting: Physical Therapy

## 2021-03-17 ENCOUNTER — Encounter: Payer: Self-pay | Admitting: Physical Therapy

## 2021-03-17 ENCOUNTER — Other Ambulatory Visit: Payer: Self-pay

## 2021-03-17 DIAGNOSIS — R293 Abnormal posture: Secondary | ICD-10-CM | POA: Diagnosis not present

## 2021-03-17 DIAGNOSIS — R2681 Unsteadiness on feet: Secondary | ICD-10-CM | POA: Diagnosis not present

## 2021-03-17 DIAGNOSIS — R2689 Other abnormalities of gait and mobility: Secondary | ICD-10-CM | POA: Diagnosis not present

## 2021-03-17 DIAGNOSIS — M6281 Muscle weakness (generalized): Secondary | ICD-10-CM | POA: Diagnosis not present

## 2021-03-17 DIAGNOSIS — R262 Difficulty in walking, not elsewhere classified: Secondary | ICD-10-CM | POA: Diagnosis not present

## 2021-03-17 NOTE — Therapy (Signed)
[facility] [address] [place], [place], [address] Phone: [phone]   Fax:  [phone]  Physical Therapy Treatment  Patient Details  Name: Casey Fisher MRN: [id number] Date of Birth: [birth year] Referring Provider (PT): Margette Fast, MD   Encounter Date: 03/17/2021   PT End of Session - 03/17/21 1627    Visit Number 21    Number of Visits 24    Date for PT Re-Evaluation 32/35/57   re-cert done on 02/27/19 - written for 30 day POC, cert for 60 days   Authorization Type Medicare - part A and B    Progress Note Due on Visit 30    PT Start Time 1537   pt arrived late   PT Stop Time 1616    PT Time Calculation (min) 39 min    Equipment Utilized During Treatment Gait belt    Activity Tolerance Patient tolerated treatment well    Behavior During Therapy [facility] for tasks assessed/performed           Past Medical History:  Diagnosis Date  . Abnormality of gait 03/24/2016  . Anxiety   . Cancer ([place])   . Cataract   . Depression   . Heart murmur   . Hereditary and idiopathic peripheral neuropathy 03/24/2016  . Hypertension   . Memory difficulty 03/24/2016  . Neuromuscular disorder ([place])   . Neuropathy   . Prostate cancer ([place])    remission    Past Surgical History:  Procedure Laterality Date  . CATARACT EXTRACTION Bilateral   . EYE SURGERY    . HERNIA REPAIR  1985  . INGUINAL HERNIA REPAIR Right 12/06/2013   Procedure: HERNIA REPAIR INGUINAL ADULT;  Surgeon: Merrie Roof, MD;  Location: [facility];  Service: General;  Laterality: Right;  . INSERTION OF MESH Right 12/06/2013   Procedure: INSERTION OF MESH;  Surgeon: Merrie Roof, MD;  Location: [facility];  Service: General;  Laterality: Right;  . PROSTATE SURGERY      There were no vitals filed for this visit.   Subjective Assessment - 03/17/21 1542    Subjective No changes since he was last here. No falls.    Pertinent History PMH: peripheral neuropathy,  parkinsonism, HTN, prostate cancer, chronic gait disorder    Limitations Walking;Standing    How long can you walk comfortably? not very long - approx. 30' back into clinic from waiting room    Patient Stated Goals just wants to be able to walk    Currently in Pain? No/denies                             [facility] Adult PT Treatment/Exercise - 03/17/21 1542      Transfers   Transfers Sit to Stand;Stand to [employer] Transfers    Sit to Stand 4: Min guard;With upper extremity assist;From bed;From chair/3-in-1    Stand to Sit 4: Min guard;With upper extremity assist;To bed;To chair/3-in-1    Comments x3 reps throughout session, cues for tall posture once in standing      Ambulation/Gait   Ambulation/Gait Yes    Ambulation/Gait Assistance 4: Min guard;4: Min assist    Ambulation/Gait Assistance Details cues for tall posture with gait and to incr step length. pt remains in crouched pattern with foot flat contact. at bout of gait at end of session, pt needing more min A for safety as pt pushing RW too far anteriorly  at times    Ambulation Distance (Feet) 100 Feet   into and out of clinic   Assistive device Rolling walker    Gait Pattern Decreased step length - right;Decreased step length - left;Decreased dorsiflexion - right;Decreased dorsiflexion - left;Right foot flat;Left foot flat;Right flexed knee in stance;Left flexed knee in stance;Left genu recurvatum;Trunk flexed;Poor foot clearance - left;Poor foot clearance - right;Step-to pattern    Ambulation Surface Level;Indoor      Neuro Re-ed    Neuro Re-ed Details  seated PWR Up x10 reps - verbal and tactile cues for proper technique and scap retraction, on green balance disc: holding purple ball and raising it overhead for posture x10 reps with cues to look up towards ball, x6 reps B lateral weight shifting with trunk elongation and no UE support reaching for targets, alternating seated marching over 4" foam beam x5 reps B,  pt needing to use hand on mat at times for balance      Exercises   Exercises Other Exercises    Other Exercises  seated trunk rotations holding purple ball x6 reps B with 5 second holds at end range, cues to look towards ball       Knee/Hip Exercises: Seated   Long Arc Quad AROM;Strengthening;Both;10 reps;Weights;Limitations;1 set    Long Arc Quad Weight 2 lbs.    Long [employer] Limitations visual cue as target on how high to kick leg, verbal and manual cues for posture                    PT Short Term Goals - 03/03/21 0807      PT SHORT TERM GOAL #1   Title ALL STGS = LTGS             PT Long Term Goals - 02/28/21 1455      PT LONG TERM GOAL #1   Title Pt and caregiver will be independent with final HEP in order to build upon functional gains made in therapy. ALL LTGS DUE 03/31/21    Baseline pt will benefit from ongoing additions for finalized HEP    Time 4    Period Weeks    Status On-going    Target Date 03/31/21      PT LONG TERM GOAL #2   Title Pt will decr TUG to 1 minute and 45 seconds or less in order to demo decr fall risk and improved functional mobility.    Baseline 1 minute 57 seconds with RW on 02/26/21    Time 4    Period Weeks    Status Revised      PT LONG TERM GOAL #3   Title Pt will be able to ambulate 57' with supervision/min guard with  RW in order to demo improved household mobility.    Baseline 100' with min guard/min A with RW    Time 4    Period Weeks    Status Revised      PT LONG TERM GOAL #4   Title Pt will incr gait speed with rollator to at least 1.1 ft/sec in order to demo improved household mobility.    Baseline 02/28/21: 0.94 ft/sec with RW. improved from 0.58 ft/sec, just not to goal.    Time 4    Period Weeks    Status Revised      PT LONG TERM GOAL #5   Title --    Baseline --    Status --  Plan - 03/17/21 1629    Clinical Impression Statement Ambulated into and out of clinic today with RW, pt  able to listen to cues to incr step length B for short periods of time. Pt needing more min A during 2nd bout of gait due to fatigue. Continued to work on strengthening of BLE and dynamic sitting balance today on balance disc - min guard as needed for balance. Will continue to progress towards LTGs    Personal Factors and Comorbidities Age;Comorbidity 3+;Time since onset of injury/illness/exacerbation;Past/Current Experience    Comorbidities peripheral neuropathy, parkinsonism, HTN, prostate cancer    Examination-Activity Limitations Locomotion Level;Sit;Stand;Transfers    Examination-Participation Restrictions Cleaning;Community Activity;Medication Management;Meal Prep   pt gets meals on wheels   Stability/Clinical Decision Making Evolving/Moderate complexity    Rehab Potential Good    PT Frequency 2x / week    PT Duration 4 weeks    PT Treatment/Interventions ADLs/Self Care Home Management;DME Instruction;Functional mobility training;Therapeutic activities;Gait training;Therapeutic exercise;Balance training;Neuromuscular re-education;Patient/family education;Passive range of motion;Energy conservation;Vestibular    PT Next Visit Plan Continue to work on gait with RW, standing posture and strengthening. POC ends next week, D/C    PT Home Exercise Plan [id number]    Consulted and Agree with Plan of Care Patient;Family member/caregiver    Family Member Consulted pt's friend Journalist, newspaper           Patient will benefit from skilled therapeutic intervention in order to improve the following deficits and impairments:  Abnormal gait,Decreased balance,Decreased activity tolerance,Decreased coordination,Decreased endurance,Decreased knowledge of use of DME,Decreased mobility,Decreased safety awareness,Decreased range of motion,Difficulty walking,Decreased strength,Hypomobility,Impaired flexibility,Postural dysfunction  Visit Diagnosis: Other abnormalities of gait and mobility  Muscle weakness  (generalized)  Difficulty in walking, not elsewhere classified  Abnormal posture     Problem List Patient Active Problem List   Diagnosis Date Noted  . Primary osteoarthritis of right knee 05/23/2020  . Low back pain 08/03/2019  . Acute pain of left shoulder 10/10/2018  . Chest pain 11/08/2017  . Memory difficulty 03/24/2016  . Hereditary and idiopathic peripheral neuropathy 03/24/2016  . Abnormality of gait 03/24/2016  . Peripheral neuropathy 04/18/2015  . Hyperlipidemia 04/18/2015  . Prostate cancer ([place]) 08/24/2014  . Right inguinal hernia 10/30/2013    Arliss Journey, PT, DPT  03/17/2021, 4:30 PM  [facility] [address] [place], [place], [address] Phone: [phone]   Fax:  [phone]  Name: CONAL SHETLEY MRN: [id number] Date of Birth: 1928/09/24

## 2021-03-19 ENCOUNTER — Other Ambulatory Visit: Payer: Self-pay | Admitting: *Deleted

## 2021-03-19 NOTE — Patient Instructions (Signed)
Goals Addressed            This Visit's Progress   . (THN)Prevent falls       Timeframe:  Short-Term Goal Priority:  Medium Start Date:   [id number]                          Expected End Date:      [id number] Follow up outreach call [id number]   No falls since last outreach    . (THN)Track and Manage My Blood Pressure-Hypertension       Timeframe:  Short-Term Goal Priority:  High Start Date:        [id number]                     Expected End Date:       [id number]        Follow Up Date [id number]   - check blood pressure daily - choose a place to take my blood pressure (home, clinic or office, retail store) - write blood pressure results in a log or diary    Why is this important?    You won't feel high blood pressure, but it can still hurt your blood vessels.   High blood pressure can cause heart or kidney problems. It can also cause a stroke.   Making lifestyle changes like losing a little weight or eating less salt will help.   Checking your blood pressure at home and at different times of the day can help to control blood pressure.   If the doctor prescribes medicine remember to take it the way the doctor ordered.   Call the office if you cannot afford the medicine or if there are questions about it.     Notes:  Patient caregiver is monitoring daily and documenting and providing all reading to health coach on follow ups calls    . Blood Pressure < 140/90       CARE PLAN ENTRY Timeframe:  Long-Range Goal Priority:  High Start Date:  [id number]        End    Date       [id number] Follow [id number]                 (see longtitudinal plan of care for additional care plan information)  Objective:  . Last practice recorded BP readings:  BP Readings from Last 3 Encounters:  06/04/20 135/65  02/02/20 (!) 145/73  01/25/20 132/66 .   Marland Kitchen Most recent eGFR/CrCl: No results found for: EGFR  No components found for: CRCL  Current Barriers:  Marland Kitchen Knowledge deficit related to self care  management of hypertension . Cognitive Deficits  Case Manager Clinical Goal(s):  Marland Kitchen Over the next 90 days, patient will attend all scheduled medical appointments:   . Over the next 90 days, patient will demonstrate improved adherence to prescribed treatment plan for hypertension as evidenced by taking all medications as prescribed, monitoring and recording blood pressure as directed, adhering to low sodium/DASH diet . Over the next 90 days, patient will demonstrate improved health management independence as evidenced by checking blood pressure as directed and notifying PCP if SBP>180 or DBP > 96, taking all medications as prescribe, and adhering to a low sodium diet as discussed. . Over the next 90 days, patient will verbalize basic understanding of hypertension disease process and self health management plan as evidenced by adhering to medication, keeping appointments and checking and documenting blood pressures  by caregiver.  Interventions:  . Evaluation of current treatment plan related to hypertension self management and patient's adherence to plan as established by provider. . Reviewed medications with patient and discussed importance of compliance . Discussed plans with patient for ongoing care management follow up and provided patient with direct contact information for care management team . Advised patient, providing education and rationale, to monitor blood pressure daily and record, calling PCP for findings outside established parameters.  . Reviewed scheduled/upcoming provider appointments including:  . Provided education regarding s/s of stroke and stroke prevention . Provided education regarding s/s of heart attack . Provided education regarding s/s of DASH diet/Low salt diet . Provided education regarding complications of uncontrolled blood pressure . Provided education regarding increasing physical activity . Provided educational material: Matter of Choice Blood Pressure  Control . Provided educational material: Exercise Program Activity Book  Patient Self Care Activities:  . Attends all scheduled provider appointments . Calls provider office for new concerns, questions, or BP outside discussed parameters . Monitors BP and records as discussed . Adheres to a low sodium diet/DASH diet . Increase physical activity as tolerated  Patient recent blood pressures are on better range of 130/82-105/59 03/19/2021 104/57 -151/85

## 2021-03-19 NOTE — Patient Outreach (Signed)
[facility]) Care Management  [place]  03/19/2021   Casey Fisher [birth year] [id number]  Kathleen telephone call to patient.  Hipaa compliance verified. RN spoke with patient caregiver [name].  He gave the documented daily  blood pressures range from 104/57 -151/85.  Patient caregivers cook and bring patient meals daily. Patient uses a walker and has fallen x 1 with no injuries. RN discussed fall precautions. Patient is now going to physical therapy 2 x a week for strenthening and balance. Patient has received his COVID booster. Patient caregiver has agreed to follow up with outreach calls.   Encounter Medications:  Outpatient Encounter Medications as of 03/19/2021  Medication Sig  . ammonium lactate (AMLACTIN) 12 % lotion Apply to both feet twice daily for dry skin  . aspirin EC 81 MG tablet Take 81 mg by mouth daily. Reported on 05/14/2016  . Calcium Carb-Cholecalciferol (CALCIUM 600 + D PO) Take 1 tablet by mouth daily.  . carbidopa-levodopa (SINEMET IR) 25-100 MG tablet 1.5 tablets three times a day for 1 month, then take 2 tablets three times a day  . cephALEXin (KEFLEX) 500 MG capsule Take 500 mg by mouth 2 (two) times daily.  Marland Kitchen donepezil (ARICEPT) 5 MG tablet Take 1 tablet (5 mg total) by mouth at bedtime.  . DULoxetine (CYMBALTA) 30 MG capsule Take 1 capsule (30 mg total) by mouth 2 (two) times daily.  . ERLEADA 60 MG tablet Take 120 mg by mouth daily.   Marland Kitchen gabapentin (NEURONTIN) 600 MG tablet TAKE 2 TABLETS BY MOUTH IN THE MORNING AND TAKE 2 TABLETS BY MOUTH AT MIDDAY AND TAKE 1 TABLET IN THE EVENING  . hydrochlorothiazide (HYDRODIURIL) 12.5 MG tablet TAKE 1 TABLET(12.5 MG) BY MOUTH DAILY  . Leuprolide Acetate (LUPRON IJ) Inject 1 application as directed every 6 (six) months.  . simvastatin (ZOCOR) 40 MG tablet TAKE 1 TABLET(40 MG) BY MOUTH DAILY  . tamsulosin (FLOMAX) 0.4 MG CAPS capsule Take 0.4 mg daily by mouth. Reported on 05/14/2016   No  facility-administered encounter medications on file as of 03/19/2021.    Functional Status:  In your present state of health, do you have any difficulty performing the following activities: 12/09/2020 04/02/2020  Hearing? N Y  Comment - need hearing aide  Vision? N N  Difficulty concentrating or making decisions? Y N  Walking or climbing stairs? Y Y  Comment - uses cane or walker  Dressing or bathing? N N  Doing errands, shopping? Tempie Donning  Comment - caregiver takes patient  Conservation officer, nature and eating ? Y Y  Comment - patient has caregivers that prepares his food  Using the Toilet? N -  In the past six months, have you accidently leaked urine? N Y  Do you have problems with loss of bowel control? N N  Managing your Medications? Y N  Managing your Finances? Y N  Housekeeping or managing your Housekeeping? Tempie Donning  Comment - Care givers  Some recent data might be hidden    Fall/Depression Screening: Fall Risk  03/19/2021 12/11/2020 12/09/2020  Falls in the past year? _0 Comment - - -  Number falls in past yr: 0 0 0  Comment - - -  Injury with Fall? _1 Risk Factor Category  - - -  Risk for fall due to : History of fall(s);Impaired balance/gait;Impaired mobility History of fall(s);Impaired balance/gait;Impaired mobility -  Follow up Falls evaluation completed;Falls prevention discussed Falls evaluation  completed;Falls prevention discussed Falls evaluation completed;Education provided   [facility] 2/9 Scores 11/11/2020 01/25/2020 01/19/2020 12/06/2019 10/31/2019 10/05/2019 08/31/2019  PHQ - 2 Score 0 0 0 0 0 0 0    Assessment:  Goals Addressed            This Visit's Progress   . (THN)Prevent falls       Timeframe:  Short-Term Goal Priority:  Medium Start Date:   [id number]                          Expected End Date:      [id number] Follow up outreach call [id number]   No falls since last outreach    . (THN)Track and Manage My Blood Pressure-Hypertension       Timeframe:  Short-Term  Goal Priority:  High Start Date:        [id number]                     Expected End Date:       [id number]        Follow Up Date [id number]   - check blood pressure daily - choose a place to take my blood pressure (home, clinic or office, retail store) - write blood pressure results in a log or diary    Why is this important?    You won't feel high blood pressure, but it can still hurt your blood vessels.   High blood pressure can cause heart or kidney problems. It can also cause a stroke.   Making lifestyle changes like losing a little weight or eating less salt will help.   Checking your blood pressure at home and at different times of the day can help to control blood pressure.   If the doctor prescribes medicine remember to take it the way the doctor ordered.   Call the office if you cannot afford the medicine or if there are questions about it.     Notes:  Patient caregiver is monitoring daily and documenting and providing all reading to health coach on follow ups calls    . Blood Pressure < 140/90       CARE PLAN ENTRY Timeframe:  Long-Range Goal Priority:  High Start Date:  [id number]        End    Date       [id number] Follow [id number]                 (see longtitudinal plan of care for additional care plan information)  Objective:  . Last practice recorded BP readings:  BP Readings from Last 3 Encounters:  06/04/20 135/65  02/02/20 (!) 145/73  01/25/20 132/66 .   Marland Kitchen Most recent eGFR/CrCl: No results found for: EGFR  No components found for: CRCL  Current Barriers:  Marland Kitchen Knowledge deficit related to self care management of hypertension . Cognitive Deficits  Case Manager Clinical Goal(s):  Marland Kitchen Over the next 90 days, patient will attend all scheduled medical appointments:   . Over the next 90 days, patient will demonstrate improved adherence to prescribed treatment plan for hypertension as evidenced by taking all medications as prescribed, monitoring and recording blood  pressure as directed, adhering to low sodium/DASH diet . Over the next 90 days, patient will demonstrate improved health management independence as evidenced by checking blood pressure as directed and notifying PCP if SBP>180 or DBP > 96, taking all medications as prescribe, and adhering to a low sodium  diet as discussed. . Over the next 90 days, patient will verbalize basic understanding of hypertension disease process and self health management plan as evidenced by adhering to medication, keeping appointments and checking and documenting blood pressures by caregiver.  Interventions:  . Evaluation of current treatment plan related to hypertension self management and patient's adherence to plan as established by provider. . Reviewed medications with patient and discussed importance of compliance . Discussed plans with patient for ongoing care management follow up and provided patient with direct contact information for care management team . Advised patient, providing education and rationale, to monitor blood pressure daily and record, calling PCP for findings outside established parameters.  . Reviewed scheduled/upcoming provider appointments including:  . Provided education regarding s/s of stroke and stroke prevention . Provided education regarding s/s of heart attack . Provided education regarding s/s of DASH diet/Low salt diet . Provided education regarding complications of uncontrolled blood pressure . Provided education regarding increasing physical activity . Provided educational material: Matter of Choice Blood Pressure Control . Provided educational material: Exercise Program Activity Book  Patient Self Care Activities:  . Attends all scheduled provider appointments . Calls provider office for new concerns, questions, or BP outside discussed parameters . Monitors BP and records as discussed . Adheres to a low sodium diet/DASH diet . Increase physical activity as tolerated  Patient  recent blood pressures are on better range of 130/82-105/59 03/19/2021 104/57 -151/85        Plan:  Follow-up:  Patient agrees to Care Plan and Follow-up. Patient caregiver will follow up with scheduling eye exam RN assisted in ordering Free COVID test kit RN provided a calendar book for further documentation RN sent update assessment for PCP RN will follow up within the month of June  [facility] [phone]

## 2021-03-21 ENCOUNTER — Other Ambulatory Visit: Payer: Self-pay

## 2021-03-21 ENCOUNTER — Encounter: Payer: Self-pay | Admitting: Physical Therapy

## 2021-03-21 ENCOUNTER — Ambulatory Visit: Payer: Medicare Other | Admitting: Physical Therapy

## 2021-03-21 DIAGNOSIS — R2681 Unsteadiness on feet: Secondary | ICD-10-CM | POA: Diagnosis not present

## 2021-03-21 DIAGNOSIS — M6281 Muscle weakness (generalized): Secondary | ICD-10-CM

## 2021-03-21 DIAGNOSIS — R293 Abnormal posture: Secondary | ICD-10-CM

## 2021-03-21 DIAGNOSIS — R2689 Other abnormalities of gait and mobility: Secondary | ICD-10-CM | POA: Diagnosis not present

## 2021-03-21 DIAGNOSIS — R262 Difficulty in walking, not elsewhere classified: Secondary | ICD-10-CM | POA: Diagnosis not present

## 2021-03-24 ENCOUNTER — Ambulatory Visit: Payer: Medicare Other | Admitting: Physical Therapy

## 2021-03-24 ENCOUNTER — Other Ambulatory Visit: Payer: Self-pay

## 2021-03-24 ENCOUNTER — Ambulatory Visit (INDEPENDENT_AMBULATORY_CARE_PROVIDER_SITE_OTHER): Payer: Medicare Other | Admitting: Podiatry

## 2021-03-24 ENCOUNTER — Encounter: Payer: Self-pay | Admitting: Podiatry

## 2021-03-24 DIAGNOSIS — B351 Tinea unguium: Secondary | ICD-10-CM | POA: Diagnosis not present

## 2021-03-24 DIAGNOSIS — L84 Corns and callosities: Secondary | ICD-10-CM | POA: Diagnosis not present

## 2021-03-24 DIAGNOSIS — M79675 Pain in left toe(s): Secondary | ICD-10-CM

## 2021-03-24 DIAGNOSIS — M79674 Pain in right toe(s): Secondary | ICD-10-CM | POA: Diagnosis not present

## 2021-03-24 DIAGNOSIS — I739 Peripheral vascular disease, unspecified: Secondary | ICD-10-CM

## 2021-03-24 NOTE — Therapy (Signed)
[facility] [address] [place], [place], [address] Phone: [phone]   Fax:  [phone]  Physical Therapy Treatment  Patient Details  Name: Casey Fisher MRN: [id number] Date of Birth: [birth year] Referring Provider (PT): Margette Fast, MD   Encounter Date: 03/21/2021   03/21/21 1412  PT Visits / Re-Eval  Visit Number 22  Number of Visits 24  Date for PT Re-Evaluation 38/75/64 (re-cert done on 02/28/28 - written for 30 day POC, cert for 60 days)  Authorization  Authorization Type Medicare - part A and B  Progress Note Due on Visit 30  PT Time Calculation  PT Start Time 1404  PT Stop Time 1445  PT Time Calculation (min) 41 min  PT - End of Session  Equipment Utilized During Treatment Gait belt  Activity Tolerance Patient tolerated treatment well  Behavior During Therapy [facility] for tasks assessed/performed     Past Medical History:  Diagnosis Date  . Abnormality of gait 03/24/2016  . Anxiety   . Cancer ([place])   . Cataract   . Depression   . Heart murmur   . Hereditary and idiopathic peripheral neuropathy 03/24/2016  . Hypertension   . Memory difficulty 03/24/2016  . Neuromuscular disorder ([place])   . Neuropathy   . Prostate cancer ([place])    remission    Past Surgical History:  Procedure Laterality Date  . CATARACT EXTRACTION Bilateral   . EYE SURGERY    . HERNIA REPAIR  1985  . INGUINAL HERNIA REPAIR Right 12/06/2013   Procedure: HERNIA REPAIR INGUINAL ADULT;  Surgeon: Merrie Roof, MD;  Location: [place];  Service: General;  Laterality: Right;  . INSERTION OF MESH Right 12/06/2013   Procedure: INSERTION OF MESH;  Surgeon: Merrie Roof, MD;  Location: [place];  Service: General;  Laterality: Right;  . PROSTATE SURGERY      There were no vitals filed for this visit.     03/21/21 1411  Symptoms/Limitations  Subjective No new complaints. No falls or pain to report.  Patient is accompained by:   Onalee Hua)  Pertinent History PMH: peripheral neuropathy, parkinsonism, HTN, prostate cancer, chronic gait disorder  Limitations Walking;Standing  How long can you walk comfortably? not very long - approx. 30' back into clinic from waiting room  Patient Stated Goals just wants to be able to walk  Pain Assessment  Currently in Pain? No/denies        03/21/21 1413  Transfers  Transfers Sit to Stand;Stand to [employer] Transfers  Sit to Stand 4: Min guard;With upper extremity assist;From bed;From chair/3-in-1  Sit to Stand Details Verbal cues for sequencing;Verbal cues for technique;Verbal cues for precautions/safety;Verbal cues for safe use of DME/AE  Stand to Sit 4: Min guard;With upper extremity assist;To bed;To chair/3-in-1  Stand to Sit Details (indicate cue type and reason) Verbal cues for technique;Verbal cues for sequencing;Verbal cues for precautions/safety;Verbal cues for safe use of DME/AE  Ambulation/Gait  Ambulation/Gait Yes  Ambulation/Gait Assistance 4: Min guard;4: Min assist  Ambulation/Gait Assistance Details cues  on posture and knee/hip extension  Ambulation Distance (Feet) 100 Feet (x2, 30 x2)  Assistive device Rolling walker  Gait Pattern Decreased step length - right;Decreased step length - left;Decreased dorsiflexion - right;Decreased dorsiflexion - left;Right foot flat;Left foot flat;Right flexed knee in stance;Left flexed knee in stance;Left genu recurvatum;Trunk flexed;Poor foot clearance - left;Poor foot clearance - right;Step-to pattern  Ambulation Surface Level;Indoor  Neuro Re-ed   Neuro Re-ed Details  for balance/strengthening: seated on green air disc- PWR! up, UE reaching for trunk elongation for ~10 reps each; with red band shoulder horizontal abdcution, rows, diagonal pulls for 10 reps each, then alternating LE marching x 10 reps. cues on posture and ex form/technique.  Knee/Hip Exercises: Aerobic  Other Aerobic Scifit level 2.8 x 8 minutes  with BLE and BUE with goal >/= 65-70 rpm for strengthening and activity tolerance.           PT Short Term Goals - 03/03/21 0807      PT SHORT TERM GOAL #1   Title ALL STGS = LTGS             PT Long Term Goals - 02/28/21 1455      PT LONG TERM GOAL #1   Title Pt and caregiver will be independent with final HEP in order to build upon functional gains made in therapy. ALL LTGS DUE 03/31/21    Baseline pt will benefit from ongoing additions for finalized HEP    Time 4    Period Weeks    Status On-going    Target Date 03/31/21      PT LONG TERM GOAL #2   Title Pt will decr TUG to 1 minute and 45 seconds or less in order to demo decr fall risk and improved functional mobility.    Baseline 1 minute 57 seconds with RW on 02/26/21    Time 4    Period Weeks    Status Revised      PT LONG TERM GOAL #3   Title Pt will be able to ambulate 60' with supervision/min guard with  RW in order to demo improved household mobility.    Baseline 100' with min guard/min A with RW    Time 4    Period Weeks    Status Revised      PT LONG TERM GOAL #4   Title Pt will incr gait speed with rollator to at least 1.1 ft/sec in order to demo improved household mobility.    Baseline 02/28/21: 0.94 ft/sec with RW. improved from 0.58 ft/sec, just not to goal.    Time 4    Period Weeks    Status Revised      PT LONG TERM GOAL #5   Title --    Baseline --    Status --             03/21/21 1412  Plan  Clinical Impression Statement Today's skilled session continued to focus on gait with RW, strengthening and postural education with no issues noted or reported in session. The pt is making steady progress toward goals and should benefit from continued PT to progress toward unmet goals.  Personal Factors and Comorbidities Age;Comorbidity 3+;Time since onset of injury/illness/exacerbation;Past/Current Experience  Comorbidities peripheral neuropathy, parkinsonism, HTN, prostate cancer   Examination-Activity Limitations Locomotion Level;Sit;Stand;Transfers  Examination-Participation Restrictions Cleaning;Community Activity;Medication Management;Meal Prep (pt gets meals on wheels)  Pt will benefit from skilled therapeutic intervention in order to improve on the following deficits Abnormal gait;Decreased balance;Decreased activity tolerance;Decreased coordination;Decreased endurance;Decreased knowledge of use of DME;Decreased mobility;Decreased safety awareness;Decreased range of motion;Difficulty walking;Decreased strength;Hypomobility;Impaired flexibility;Postural dysfunction  Stability/Clinical Decision Making Evolving/Moderate complexity  Rehab Potential Good  PT Frequency 2x / week  PT Duration 4 weeks  PT Treatment/Interventions ADLs/Self Care Home Management;DME Instruction;Functional mobility training;Therapeutic activities;Gait training;Therapeutic exercise;Balance training;Neuromuscular re-education;Patient/family education;Passive range of motion;Energy conservation;Vestibular  PT Next Visit Plan check goals for anticipated discharge  PT [place]  Consulted and Agree with Plan of Care Patient;Family member/caregiver  Family Member Consulted pt's friend Journalist, newspaper          Patient will benefit from skilled therapeutic intervention in order to improve the following deficits and impairments:  Abnormal gait,Decreased balance,Decreased activity tolerance,Decreased coordination,Decreased endurance,Decreased knowledge of use of DME,Decreased mobility,Decreased safety awareness,Decreased range of motion,Difficulty walking,Decreased strength,Hypomobility,Impaired flexibility,Postural dysfunction  Visit Diagnosis: Other abnormalities of gait and mobility  Muscle weakness (generalized)  Difficulty in walking, not elsewhere classified  Abnormal posture  Unsteadiness on feet     Problem List Patient Active Problem List   Diagnosis Date Noted  .  Primary osteoarthritis of right knee 05/23/2020  . Low back pain 08/03/2019  . Acute pain of left shoulder 10/10/2018  . Chest pain 11/08/2017  . Memory difficulty 03/24/2016  . Hereditary and idiopathic peripheral neuropathy 03/24/2016  . Abnormality of gait 03/24/2016  . Peripheral neuropathy 04/18/2015  . Hyperlipidemia 04/18/2015  . Prostate cancer ([place]) 08/24/2014  . Right inguinal hernia 10/30/2013    Willow Ora, PTA, [facility] Outpatient Neuro [facility] [address], [place], [place] [address] [phone] 03/24/21, 8:38 AM   Name: Casey Fisher MRN: [id number] Date of Birth: 07/31/1928

## 2021-03-25 ENCOUNTER — Encounter: Payer: Self-pay | Admitting: Physical Therapy

## 2021-03-25 ENCOUNTER — Ambulatory Visit: Payer: Medicare Other | Admitting: Physical Therapy

## 2021-03-25 DIAGNOSIS — R262 Difficulty in walking, not elsewhere classified: Secondary | ICD-10-CM | POA: Diagnosis not present

## 2021-03-25 DIAGNOSIS — R2689 Other abnormalities of gait and mobility: Secondary | ICD-10-CM

## 2021-03-25 DIAGNOSIS — M6281 Muscle weakness (generalized): Secondary | ICD-10-CM

## 2021-03-25 DIAGNOSIS — R293 Abnormal posture: Secondary | ICD-10-CM

## 2021-03-25 DIAGNOSIS — R2681 Unsteadiness on feet: Secondary | ICD-10-CM

## 2021-03-26 NOTE — Therapy (Signed)
[facility] [address] [place], [place], [address] Phone: [phone]   Fax:  [phone]  Physical Therapy Treatment  Patient Details  Name: Casey Fisher MRN: [id number] Date of Birth: 1928-09-08 Referring Provider (PT): Margette Fast, MD   Encounter Date: 03/25/2021   PT End of Session - 03/25/21 1409    Visit Number 23    Number of Visits 24    Date for PT Re-Evaluation 03/50/09   re-cert done on 03/04/17 - written for 30 day POC, cert for 60 days   Authorization Type Medicare - part A and B    Progress Note Due on Visit 30    PT Start Time 1403    PT Stop Time 1445    PT Time Calculation (min) 42 min    Equipment Utilized During Treatment Gait belt    Activity Tolerance Patient tolerated treatment well    Behavior During Therapy [facility] for tasks assessed/performed           Past Medical History:  Diagnosis Date  . Abnormality of gait 03/24/2016  . Anxiety   . Cancer ([place])   . Cataract   . Depression   . Heart murmur   . Hereditary and idiopathic peripheral neuropathy 03/24/2016  . Hypertension   . Memory difficulty 03/24/2016  . Neuromuscular disorder ([place])   . Neuropathy   . Prostate cancer ([place])    remission    Past Surgical History:  Procedure Laterality Date  . CATARACT EXTRACTION Bilateral   . EYE SURGERY    . HERNIA REPAIR  1985  . INGUINAL HERNIA REPAIR Right 12/06/2013   Procedure: HERNIA REPAIR INGUINAL ADULT;  Surgeon: Merrie Roof, MD;  Location: [place];  Service: General;  Laterality: Right;  . INSERTION OF MESH Right 12/06/2013   Procedure: INSERTION OF MESH;  Surgeon: Merrie Roof, MD;  Location: [place];  Service: General;  Laterality: Right;  . PROSTATE SURGERY      There were no vitals filed for this visit.   Subjective Assessment - 03/25/21 1409    Subjective No new complaints. No falls or pain to report.    Patient is accompained by: --   friend [name]   Pertinent  History PMH: peripheral neuropathy, parkinsonism, HTN, prostate cancer, chronic gait disorder    Limitations Walking;Standing    How long can you walk comfortably? not very long - approx. 30' back into clinic from waiting room    Patient Stated Goals just wants to be able to walk    Currently in Pain? No/denies               [facility] Adult PT Treatment/Exercise - 03/25/21 1410      Transfers   Transfers Stand to Sit    Sit to Stand 4: Min guard;With upper extremity assist;From bed;From chair/3-in-1    Stand to Sit 4: Min guard;With upper extremity assist;To bed;To chair/3-in-1      Ambulation/Gait   Ambulation/Gait Yes    Ambulation/Gait Assistance 4: Min guard;4: Min assist    Ambulation/Gait Assistance Details cues on posture, hip/knee extension and for increased step length with gait.    Ambulation Distance (Feet) 108 Feet   x2, plus around gym with session   Assistive device Rolling walker    Gait Pattern Decreased step length - right;Decreased step length - left;Decreased dorsiflexion - right;Decreased dorsiflexion - left;Right foot flat;Left foot flat;Right flexed knee in stance;Left flexed knee in stance;Left genu recurvatum;Trunk  flexed;Poor foot clearance - left;Poor foot clearance - right;Step-to pattern    Ambulation Surface Indoor    Gait velocity 31.85 sec's= 1.03 ft/sec with RW, min guard assist      Neuro Re-ed    Neuro Re-ed Details  for strengthening/balance/NMR: standing with UE support on sturdy surface- alternating UE raises x 5 reps each side, then alternating  lateral side stepping with same side reaching out for 5 reps each/each side for 2 sets.      Exercises   Exercises Other Exercises    Other Exercises  seated at edge of mat: PWR! up for 10 reps with cues for full forward fold and wide arms.      Knee/Hip Exercises: Aerobic   Other Aerobic Scifit level 3.0 x 8 minutes with BLE and BUE with goal >/= 65  rpm for strengthening and activity tolerance.                     PT Short Term Goals - 03/03/21 0807      PT SHORT TERM GOAL #1   Title ALL STGS = LTGS             PT Long Term Goals - 03/25/21 1633      PT LONG TERM GOAL #1   Title Pt and caregiver will be independent with final HEP in order to build upon functional gains made in therapy. ALL LTGS DUE 03/31/21    Baseline pt will benefit from ongoing additions for finalized HEP    Time 4    Period Weeks    Status On-going      PT LONG TERM GOAL #2   Title Pt will decr TUG to 1 minute and 45 seconds or less in order to demo decr fall risk and improved functional mobility.    Baseline 1 minute 57 seconds with RW on 02/26/21    Time 4    Period Weeks    Status Revised      PT LONG TERM GOAL #3   Title Pt will be able to ambulate 52' with supervision/min guard with  RW in order to demo improved household mobility.    Baseline 100' with min guard/min A with RW    Time 4    Period Weeks    Status Revised      PT LONG TERM GOAL #4   Title Pt will incr gait speed with rollator to at least 1.1 ft/sec in order to demo improved household mobility.    Baseline 03/25/21: 1.03 ft/sec with RW, improved from 0.94 ft/sec just not to goal    Status Partially Met                 Plan - 03/25/21 1410    Clinical Impression Statement Today's skilled session continued to focus on strengthening and gait with RW. Continues to need cues on posture and hip/knee extension with gait. No issues noted or reported in session. Began to address LTGs with improvement noted in gait speed today, just not to goal level.    Personal Factors and Comorbidities Age;Comorbidity 3+;Time since onset of injury/illness/exacerbation;Past/Current Experience    Comorbidities peripheral neuropathy, parkinsonism, HTN, prostate cancer    Examination-Activity Limitations Locomotion Level;Sit;Stand;Transfers    Examination-Participation Restrictions Cleaning;Community Activity;Medication Management;Meal Prep    pt gets meals on wheels   Stability/Clinical Decision Making Evolving/Moderate complexity    Rehab Potential Good    PT Frequency 2x / week    PT Duration 4  weeks    PT Treatment/Interventions ADLs/Self Care Home Management;DME Instruction;Functional mobility training;Therapeutic activities;Gait training;Therapeutic exercise;Balance training;Neuromuscular re-education;Patient/family education;Passive range of motion;Energy conservation;Vestibular    PT Next Visit Plan check goals for anticipated discharge    PT [place] and Agree with Plan of Care Patient;Family member/caregiver    Family Member Consulted pt's friend Journalist, newspaper           Patient will benefit from skilled therapeutic intervention in order to improve the following deficits and impairments:  Abnormal gait,Decreased balance,Decreased activity tolerance,Decreased coordination,Decreased endurance,Decreased knowledge of use of DME,Decreased mobility,Decreased safety awareness,Decreased range of motion,Difficulty walking,Decreased strength,Hypomobility,Impaired flexibility,Postural dysfunction  Visit Diagnosis: Other abnormalities of gait and mobility  Muscle weakness (generalized)  Difficulty in walking, not elsewhere classified  Abnormal posture  Unsteadiness on feet     Problem List Patient Active Problem List   Diagnosis Date Noted  . Primary osteoarthritis of right knee 05/23/2020  . Low back pain 08/03/2019  . Acute pain of left shoulder 10/10/2018  . Chest pain 11/08/2017  . Memory difficulty 03/24/2016  . Hereditary and idiopathic peripheral neuropathy 03/24/2016  . Abnormality of gait 03/24/2016  . Peripheral neuropathy 04/18/2015  . Hyperlipidemia 04/18/2015  . Prostate cancer ([place]) 08/24/2014  . Right inguinal hernia 10/30/2013    Willow Ora, PTA, [facility] [address], [place], [place] [address] [phone] 03/26/21, 7:55 PM    Name: Casey Fisher MRN: [id number] Date of Birth: July 13, 1928

## 2021-03-27 NOTE — Progress Notes (Signed)
  Subjective:  Patient ID: Casey Fisher, male    DOB: 19-Jun-1928,  MRN: [id number]  Casey Fisher presents to clinic today for for at risk foot care. Patient has h/o PAD and painful thick toenails that are difficult to trim. Pain interferes with ambulation. Aggravating factors include wearing enclosed shoe gear. Pain is relieved with periodic professional debridement..  87 y.o. male presents with the above complaint. He is accompanied by a caregiver on today's visit.  PCP is Dr. Merri Ray. Last visit was 12/09/2020. He voices no new pedal concerns on today's visit.  Review of Systems: Negative except as noted in the HPI.  Allergies  Allergen Reactions  . Lyrica [Pregabalin] Other (See Comments)    "constipation"   Objective:   Constitutional Casey Fisher is a pleasant 87 y.o. African American male, WD, WN in NAD.Marland Kitchen AAO x 3.   Vascular Capillary refill time to digits immediate b/l. Palpable DP pulse(s) b/l lower extremities Nonpalpable PT pulse(s) b/l lower extremities. Pedal hair absent. Lower extremity skin temperature gradient within normal limits.  No cyanosis or clubbing noted.  Neurologic Normal speech. Oriented to person, place, and time. Epicritic sensation to light touch grossly present bilaterally. Protective sensation diminished with 10g monofilament b/l.  Dermatologic Pedal skin with normal turgor, texture and tone bilaterally. No open wounds bilaterally. No interdigital macerations bilaterally. Toenails 1-5 b/l elongated, discolored, dystrophic, thickened, crumbly with subungual debris and tenderness to dorsal palpation. Hyperkeratotic lesion(s) L 2nd toe.  No erythema, no edema, no drainage, no fluctuance.  Orthopedic: Normal muscle strength 5/5 to all lower extremity muscle groups bilaterally. No pain crepitus or joint limitation noted with ROM b/l. Hallux valgus with bunion deformity noted b/l lower extremities. Hammertoes noted to the b/l lower extremities.  Utilizes wheelchair for mobility assistance.   Radiographs: None Assessment:   1. Pain due to onychomycosis of toenails of both feet   2. Corns   3. PAD (peripheral artery disease) ([place])    Plan:  Patient was evaluated and treated and all questions answered.  Onychomycosis with pain -Nails palliatively debridement as below -Educated on self-care  Procedure: Nail Debridement Rationale: Pain Type of Debridement: manual, sharp debridement. Instrumentation: Nail nipper, rotary burr. Number of Nails: 10 -Examined patient. -No new findings. No new orders. -Toenails 1-5 b/l were debrided in length and girth with sterile nail nippers and dremel without iatrogenic bleeding.  -Corn(s) L 2nd toe pared utilizing sterile scalpel blade without complication or incident. Total number debrided=1. -Patient to report any pedal injuries to medical professional immediately. -Patient/POA to call should there be question/concern in the interim.  Return in about 3 months (around 06/24/2021).  Marzetta Board, DPM

## 2021-03-28 ENCOUNTER — Encounter: Payer: Self-pay | Admitting: Physical Therapy

## 2021-03-28 ENCOUNTER — Ambulatory Visit: Payer: Medicare Other | Attending: Neurology | Admitting: Physical Therapy

## 2021-03-28 ENCOUNTER — Other Ambulatory Visit: Payer: Self-pay

## 2021-03-28 DIAGNOSIS — R262 Difficulty in walking, not elsewhere classified: Secondary | ICD-10-CM | POA: Insufficient documentation

## 2021-03-28 DIAGNOSIS — M6281 Muscle weakness (generalized): Secondary | ICD-10-CM | POA: Diagnosis not present

## 2021-03-28 DIAGNOSIS — R2689 Other abnormalities of gait and mobility: Secondary | ICD-10-CM | POA: Diagnosis not present

## 2021-03-28 DIAGNOSIS — R293 Abnormal posture: Secondary | ICD-10-CM | POA: Diagnosis not present

## 2021-03-28 NOTE — Patient Instructions (Signed)
Access Code: [email or web address] URL: [email or web address] Date: 03/28/2021 Prepared by: Janann August  Exercises Seated Hamstring Stretch - 2 x daily - 5 x weekly - 3 sets - 30 hold Seated Ankle Plantarflexion with Resistance - 1 x daily - 5 x weekly - 1 sets - 10 reps Seated March with Resistance - 1 x daily - 5 x weekly - 1 sets - 10 reps Seated Hip Abduction with Resistance - 1 x daily - 5 x weekly - 1 sets - 10 reps Seated Knee Extension with Resistance - 1 x daily - 5 x weekly - 1 sets - 10 reps Sit to Stand with Counter Support - 1 x daily - 5 x weekly - 1 sets - 5 reps   Plus seated PWR Up

## 2021-03-28 NOTE — Therapy (Signed)
[facility] [address] [place], [place], [address] Phone: [phone]   Fax:  [phone]  Physical Therapy Treatment/Discharge Summary   Patient Details  Name: Casey Fisher MRN: [id number] Date of Birth: [birth year] Referring Provider (PT): Margette Fast, MD   Encounter Date: 03/28/2021   PT End of Session - 03/28/21 1459    Visit Number 24    Number of Visits 24    Date for PT Re-Evaluation 34/03/70   re-cert done on 09/02/42 - written for 30 day POC, cert for 60 days   Authorization Type Medicare - part A and B    Progress Note Due on Visit 30    PT Start Time 1407   pt late to session   PT Stop Time 1447    PT Time Calculation (min) 40 min    Equipment Utilized During Treatment Gait belt    Activity Tolerance Patient tolerated treatment well    Behavior During Therapy [facility] for tasks assessed/performed           Past Medical History:  Diagnosis Date  . Abnormality of gait 03/24/2016  . Anxiety   . Cancer ([place])   . Cataract   . Depression   . Heart murmur   . Hereditary and idiopathic peripheral neuropathy 03/24/2016  . Hypertension   . Memory difficulty 03/24/2016  . Neuromuscular disorder ([place])   . Neuropathy   . Prostate cancer ([place])    remission    Past Surgical History:  Procedure Laterality Date  . CATARACT EXTRACTION Bilateral   . EYE SURGERY    . HERNIA REPAIR  1985  . INGUINAL HERNIA REPAIR Right 12/06/2013   Procedure: HERNIA REPAIR INGUINAL ADULT;  Surgeon: Merrie Roof, MD;  Location: [place];  Service: General;  Laterality: Right;  . INSERTION OF MESH Right 12/06/2013   Procedure: INSERTION OF MESH;  Surgeon: Merrie Roof, MD;  Location: [place];  Service: General;  Laterality: Right;  . PROSTATE SURGERY      There were no vitals filed for this visit.   Subjective Assessment - 03/28/21 1413    Subjective No falls. Doing good.    Patient is accompained by: --   friend [name]    Pertinent History PMH: peripheral neuropathy, parkinsonism, HTN, prostate cancer, chronic gait disorder    Limitations Walking;Standing    How long can you walk comfortably? not very long - approx. 30' back into clinic from waiting room    Patient Stated Goals just wants to be able to walk    Currently in Pain? No/denies                            Access Code: [email or web address] URL: [email or web address] Date: 03/28/2021 Prepared by: Melvia Heaps and reviewed pt's HEP:   Exercises Seated Hamstring Stretch - 2 x daily - 5 x weekly - 3 sets - 30 hold Seated Ankle Plantarflexion with Resistance - 1 x daily - 5 x weekly - 1 sets - 10 reps Seated March with Resistance - 1 x daily - 5 x weekly - 1 sets - 10 reps Seated Hip Abduction with Resistance - 1 x daily - 5 x weekly - 1 sets - 10 reps Seated Knee Extension with Resistance - 1 x daily - 5 x weekly - 1 sets - 10 reps Sit to Stand with Counter Support - 1 x daily - 5 x weekly -  1 sets - 5 reps   Plus seated PWR Up x10 reps (new addition with handout provided)  [facility] Adult PT Treatment/Exercise - 03/28/21 1509      Transfers   Transfers Stand to Sit    Sit to Stand 4: Min guard;With upper extremity assist;From bed;From chair/3-in-1    Stand to Sit 4: Min guard;With upper extremity assist;To bed;To chair/3-in-1    Comments approx. 3 reps performed throughout session, cues for tall posture initially and scooting out towards edge for improved ease of transfer.      Ambulation/Gait   Ambulation/Gait Yes    Ambulation/Gait Assistance 4: Min guard;4: Min assist    Ambulation/Gait Assistance Details ambulated into and out of clinic. cues on posture, hip/knee extension and for increased step length with gait. pt pushing RW too far anteriorly when fatigued at times.    Ambulation Distance (Feet) 100 Feet   x2   Assistive device Rolling walker    Gait Pattern Decreased step length - right;Decreased  step length - left;Decreased dorsiflexion - right;Decreased dorsiflexion - left;Right foot flat;Left foot flat;Right flexed knee in stance;Left flexed knee in stance;Left genu recurvatum;Trunk flexed;Poor foot clearance - left;Poor foot clearance - right;Step-to pattern    Ambulation Surface Level;Indoor              PHYSICAL THERAPY DISCHARGE SUMMARY  Visits from Start of Care: 24  Current functional level related to goals / functional outcomes: See goals.   Remaining deficits: Gait abormalities (crouched gait), balance impairments, impaired posture, decr ROM (notably in hamstrings), decr strength.    Education / Equipment: HEP   Plan: Patient agrees to discharge.  Patient goals were partially met. Patient is being discharged due to meeting the stated rehab goals.  ?????            PT Short Term Goals - 03/03/21 0807      PT SHORT TERM GOAL #1   Title ALL STGS = LTGS             PT Long Term Goals - 03/28/21 1458      PT LONG TERM GOAL #1   Title Pt and caregiver will be independent with final HEP in order to build upon functional gains made in therapy. ALL LTGS DUE 03/31/21    Baseline finalized on 03/28/21    Time 4    Period Weeks    Status Achieved      PT LONG TERM GOAL #2   Title Pt will decr TUG to 1 minute and 45 seconds or less in order to demo decr fall risk and improved functional mobility.    Baseline 1 minute 57 seconds with RW on 02/26/21 - not assessed on 03/28/21 due to time constraints    Time 4    Period Weeks    Status Deferred      PT LONG TERM GOAL #3   Title Pt will be able to ambulate 71' with supervision/min guard with  RW in order to demo improved household mobility.    Baseline 100' with min guard/min A with RW    Time 4    Period Weeks    Status Not Met      PT LONG TERM GOAL #4   Title Pt will incr gait speed with rollator to at least 1.1 ft/sec in order to demo improved household mobility.    Baseline 03/25/21: 1.03 ft/sec with  RW, improved from 0.94 ft/sec just not to goal    Status Partially  Met                 Plan - 03/28/21 1500    Clinical Impression Statement today's skilled session focused on reviewing pt's HEP for D/C visit today. No issues noted - added seated PWR Up for postural strengthening. Did not have time to assess TUG today due to time constraints. Pt has made improvements since initial eval, but progress has plateaued. Pt able to ambulate approx. 100' with RW with min guard/min A at times for turning and pt still with forward flexed and crouched posture. Pt in agreement to D/C at this time. Discussed will need a new order to return to PT in the future.    Personal Factors and Comorbidities Age;Comorbidity 3+;Time since onset of injury/illness/exacerbation;Past/Current Experience    Comorbidities peripheral neuropathy, parkinsonism, HTN, prostate cancer    Examination-Activity Limitations Locomotion Level;Sit;Stand;Transfers    Examination-Participation Restrictions Cleaning;Community Activity;Medication Management;Meal Prep   pt gets meals on wheels   Stability/Clinical Decision Making Evolving/Moderate complexity    Rehab Potential Good    PT Frequency 2x / week    PT Duration 4 weeks    PT Treatment/Interventions ADLs/Self Care Home Management;DME Instruction;Functional mobility training;Therapeutic activities;Gait training;Therapeutic exercise;Balance training;Neuromuscular re-education;Patient/family education;Passive range of motion;Energy conservation;Vestibular    PT Next Visit Plan D/C    PT Home Exercise Plan [email or web address]    Consulted and Agree with Plan of Care Patient;Family member/caregiver    Family Member Consulted pt's friend Journalist, newspaper           Patient will benefit from skilled therapeutic intervention in order to improve the following deficits and impairments:  Abnormal gait,Decreased balance,Decreased activity tolerance,Decreased coordination,Decreased endurance,Decreased  knowledge of use of DME,Decreased mobility,Decreased safety awareness,Decreased range of motion,Difficulty walking,Decreased strength,Hypomobility,Impaired flexibility,Postural dysfunction  Visit Diagnosis: Other abnormalities of gait and mobility  Muscle weakness (generalized)  Difficulty in walking, not elsewhere classified  Abnormal posture     Problem List Patient Active Problem List   Diagnosis Date Noted  . Primary osteoarthritis of right knee 05/23/2020  . Low back pain 08/03/2019  . Acute pain of left shoulder 10/10/2018  . Chest pain 11/08/2017  . Memory difficulty 03/24/2016  . Hereditary and idiopathic peripheral neuropathy 03/24/2016  . Abnormality of gait 03/24/2016  . Peripheral neuropathy 04/18/2015  . Hyperlipidemia 04/18/2015  . Prostate cancer ([place]) 08/24/2014  . Right inguinal hernia 10/30/2013    Arliss Journey, PT, DPT  03/28/2021, 3:10 PM  [facility] [address] [place], [place], [address] Phone: [phone]   Fax:  [phone]  Name: KHYAN OATS MRN: [id number] Date of Birth: Feb 28, 1928

## 2021-05-01 ENCOUNTER — Other Ambulatory Visit: Payer: Self-pay | Admitting: *Deleted

## 2021-05-01 MED ORDER — DULOXETINE HCL 30 MG PO CPEP
30.0000 mg | ORAL_CAPSULE | Freq: Two times a day (BID) | ORAL | 1 refills | Status: DC
Start: 2021-05-01 — End: 2023-04-09

## 2021-05-07 ENCOUNTER — Encounter: Payer: Self-pay | Admitting: Neurology

## 2021-05-07 ENCOUNTER — Ambulatory Visit (INDEPENDENT_AMBULATORY_CARE_PROVIDER_SITE_OTHER): Payer: Medicare Other | Admitting: Neurology

## 2021-05-07 VITALS — BP 137/67 | HR 76 | Ht 74.0 in | Wt 173.0 lb

## 2021-05-07 DIAGNOSIS — R413 Other amnesia: Secondary | ICD-10-CM

## 2021-05-07 DIAGNOSIS — G20 Parkinson's disease: Secondary | ICD-10-CM | POA: Diagnosis not present

## 2021-05-07 DIAGNOSIS — R269 Unspecified abnormalities of gait and mobility: Secondary | ICD-10-CM | POA: Diagnosis not present

## 2021-05-07 DIAGNOSIS — G20A1 Parkinson's disease without dyskinesia, without mention of fluctuations: Secondary | ICD-10-CM | POA: Insufficient documentation

## 2021-05-07 DIAGNOSIS — G609 Hereditary and idiopathic neuropathy, unspecified: Secondary | ICD-10-CM | POA: Diagnosis not present

## 2021-05-07 HISTORY — DX: Parkinson's disease without dyskinesia, without mention of fluctuations: G20.A1

## 2021-05-07 HISTORY — DX: Parkinson's disease: G20

## 2021-05-07 MED ORDER — CARBIDOPA-LEVODOPA 25-250 MG PO TABS: 1.0000 | ORAL_TABLET | Freq: Three times a day (TID) | ORAL | 1 refills | Status: DC

## 2021-05-07 NOTE — Progress Notes (Signed)
Reason for visit: Gait disorder, peripheral neuropathy, parkinsonism  Casey Fisher is an 87 y.o. male  History of present illness:  Casey Fisher is a 87 year old right-handed black male with a history of a peripheral neuropathy associated with a gait disorder.  The patient has had some mild memory issues as well.  The patient has developed signs of parkinsonism, he was placed on Sinemet but he has not had a dramatic improvement with this.  CT scan evaluations of the brain has shown relatively extensive small vessel disease.  The patient has undergone physical therapy which seemed to help while he was getting therapy, but as soon as the physical therapy stopped his walking has reverted back to what it was.  The patient tends to walk with his knees flexed.  He walks with a walker.  He can only walk a short distance at this point.  He comes in with a caretaker.  The patient is eating and drinking well, he sleeps well at night.  He has not had any falls.  Past Medical History:  Diagnosis Date  . Abnormality of gait 03/24/2016  . Anxiety   . Cancer ([place])   . Cataract   . Depression   . Heart murmur   . Hereditary and idiopathic peripheral neuropathy 03/24/2016  . Hypertension   . Memory difficulty 03/24/2016  . Neuromuscular disorder ([place])   . Neuropathy   . Prostate cancer ([place])    remission    Past Surgical History:  Procedure Laterality Date  . CATARACT EXTRACTION Bilateral   . EYE SURGERY    . HERNIA REPAIR  1985  . INGUINAL HERNIA REPAIR Right 12/06/2013   Procedure: HERNIA REPAIR INGUINAL ADULT;  Surgeon: Merrie Roof, MD;  Location: [facility];  Service: General;  Laterality: Right;  . INSERTION OF MESH Right 12/06/2013   Procedure: INSERTION OF MESH;  Surgeon: Merrie Roof, MD;  Location: [facility];  Service: General;  Laterality: Right;  . PROSTATE SURGERY      Family History  Problem Relation Age of Onset  . Brain cancer Daughter   . Heart disease Mother   .  Stroke Mother   . Heart disease Father     Social history:  reports that he has quit smoking. He has never used smokeless tobacco. He reports that he does not drink alcohol and does not use drugs.    Allergies  Allergen Reactions  . Lyrica [Pregabalin] Other (See Comments)    "constipation"    Medications:  Prior to Admission medications   Medication Sig Start Date End Date Taking? Authorizing Provider  ammonium lactate (AMLACTIN) 12 % lotion Apply to both feet twice daily for dry skin 05/25/19  Yes Galaway, Stephani Police, DPM  aspirin EC 81 MG tablet Take 81 mg by mouth daily. Reported on 05/14/2016   Yes [provider]  Calcium Carb-Cholecalciferol (CALCIUM 600 + D PO) Take 1 tablet by mouth daily.   Yes [provider]  carbidopa-levodopa (SINEMET IR) 25-100 MG tablet 1.5 tablets three times a day for 1 month, then take 2 tablets three times a day 12/05/20  Yes Kathrynn Ducking, MD  cephALEXin (KEFLEX) 500 MG capsule Take 500 mg by mouth 2 (two) times daily. 11/08/20  Yes [provider]  donepezil (ARICEPT) 5 MG tablet Take 1 tablet (5 mg total) by mouth at bedtime. 03/12/21  Yes Suzzanne Cloud, NP  DULoxetine (CYMBALTA) 30 MG capsule Take 1 capsule (30 mg  total) by mouth 2 (two) times daily. 05/01/21  Yes Suzzanne Cloud, NP  ERLEADA 60 MG tablet Take 120 mg by mouth daily.  06/06/18  Yes [provider]  gabapentin (NEURONTIN) 600 MG tablet TAKE 2 TABLETS BY MOUTH IN THE MORNING AND TAKE 2 TABLETS BY MOUTH AT MIDDAY AND TAKE 1 TABLET IN THE EVENING 11/11/20  Yes [name], MD  hydrochlorothiazide (HYDRODIURIL) 12.5 MG tablet TAKE 1 TABLET(12.5 MG) BY MOUTH DAILY 11/11/20  Yes [name], MD  Leuprolide Acetate (LUPRON IJ) Inject 1 application as directed every 6 (six) months.   Yes [provider]  simvastatin (ZOCOR) 40 MG tablet TAKE 1 TABLET(40 MG) BY MOUTH DAILY 02/21/21  Yes [name], MD  tamsulosin (FLOMAX) 0.4 MG CAPS  capsule Take 0.4 mg daily by mouth. Reported on 05/14/2016 08/17/15  Yes [provider]    ROS:  Out of a complete 14 system review of symptoms, the patient complains only of the following symptoms, and all other reviewed systems are negative.  Difficulty walking Memory problems  Blood pressure 137/67, pulse 76, height 6\' 2"  (1.88 m), weight 173 lb (78.5 kg).  Physical Exam  General: The patient is alert and cooperative at the time of the examination.  Skin: No significant peripheral edema is noted.   Neurologic Exam  Mental status: The patient is alert and oriented x 3 at the time of the examination.   Cranial nerves: Facial symmetry is present. Speech is normal, no aphasia or dysarthria is noted. Extraocular movements are full. Visual fields are full.  Masking the face is seen.  Motor: The patient has good strength in all 4 extremities, with exception some weakness of the intrinsic muscles of the hands bilaterally.  Sensory examination: Soft touch sensation is symmetric on the face, arms, and legs.  Coordination: The patient has good finger-nose-finger and heel-to-shin bilaterally.  Gait and station: The patient requires some assistance with standing, once up, he can walk a few steps with the examiner.  Gait is somewhat wide-based, the patient walks with the knees flexed.  At times he appears to have some freezing of the feet when trying to walk.  He is able to stand independently, he does not fall.  Reflexes: Deep tendon reflexes are symmetric.   Assessment/Plan:  1.  Gait disorder  2.  Peripheral neuropathy  3.  Parkinsonism  The patient does have parkinsonism, it is not clear that he has true Parkinson's disease.  He does have fairly extensive white matter changes in the brain from small vessel disease which could be a contributing factor along with a peripheral neuropathy.  The patient will be increased on the Sinemet to a 25/250 mg dosing, taking 1 tablet 3  times daily to see if this elicits any improvement in his gait.  The patient will follow up in 4 to 5 months.  Jill Alexanders MD 05/07/2021 3:49 PM  [facility] [address] [place], [place] [address]  Phone [phone] Fax [phone]

## 2021-05-07 NOTE — Patient Instructions (Signed)
We will increase the Sinemet to 25/250 taking one three times a day.  Sinemet (carbidopa) may result in confusion or hallucinations, drowsiness, nausea, or dizziness. If any significant side effects are noted, please contact our office. Sinemet may not be well absorbed when taken with high protein meals, if tolerated it is best to take 30-45 minutes before you eat.

## 2021-05-12 ENCOUNTER — Ambulatory Visit (INDEPENDENT_AMBULATORY_CARE_PROVIDER_SITE_OTHER): Payer: Medicare Other | Admitting: Family Medicine

## 2021-05-12 ENCOUNTER — Encounter: Payer: Self-pay | Admitting: Family Medicine

## 2021-05-12 ENCOUNTER — Other Ambulatory Visit: Payer: Self-pay

## 2021-05-12 VITALS — BP 122/66 | HR 86 | Temp 98.4°F | Resp 17 | Ht 74.0 in

## 2021-05-12 DIAGNOSIS — I10 Essential (primary) hypertension: Secondary | ICD-10-CM

## 2021-05-12 DIAGNOSIS — G629 Polyneuropathy, unspecified: Secondary | ICD-10-CM | POA: Diagnosis not present

## 2021-05-12 DIAGNOSIS — E785 Hyperlipidemia, unspecified: Secondary | ICD-10-CM

## 2021-05-12 DIAGNOSIS — G20 Parkinson's disease: Secondary | ICD-10-CM | POA: Diagnosis not present

## 2021-05-12 LAB — COMPREHENSIVE METABOLIC PANEL
ALT: 3 U/L (ref 0–53)
AST: 14 U/L (ref 0–37)
Albumin: 4.2 g/dL (ref 3.5–5.2)
Alkaline Phosphatase: 63 U/L (ref 39–117)
BUN: 19 mg/dL (ref 6–23)
CO2: 32 mEq/L (ref 19–32)
Calcium: 9.6 mg/dL (ref 8.4–10.5)
Chloride: 101 mEq/L (ref 96–112)
Creatinine, Ser: 1.19 mg/dL (ref 0.40–1.50)
GFR: 53.51 mL/min — ABNORMAL LOW (ref >60.00)
Glucose, Bld: 96 mg/dL (ref 70–99)
Potassium: 4.1 mEq/L (ref 3.5–5.1)
Sodium: 140 mEq/L (ref 135–145)
Total Bilirubin: 0.4 mg/dL (ref 0.2–1.2)
Total Protein: 7.4 g/dL (ref 6.0–8.3)

## 2021-05-12 LAB — LIPID PANEL
Cholesterol: 167 mg/dL (ref 0–200)
HDL: 58.2 mg/dL (ref >39.00)
LDL Cholesterol: 92 mg/dL (ref 0–99)
NonHDL: 108.72
Total CHOL/HDL Ratio: 3
Triglycerides: 82 mg/dL (ref 0.0–149.0)
VLDL: 16.4 mg/dL (ref 0.0–40.0)

## 2021-05-12 MED ORDER — SIMVASTATIN 40 MG PO TABS: ORAL_TABLET | ORAL | 1 refills | Status: DC

## 2021-05-12 MED ORDER — HYDROCHLOROTHIAZIDE 12.5 MG PO TABS: ORAL_TABLET | ORAL | 1 refills | Status: DC

## 2021-05-12 MED ORDER — GABAPENTIN 600 MG PO TABS: ORAL_TABLET | ORAL | 1 refills | Status: DC

## 2021-05-12 NOTE — Progress Notes (Signed)
Subjective:  Patient ID: Casey Fisher, male    DOB: 1928/08/01  Age: 87 y.o. MRN: [id number]  CC:  Chief Complaint  Patient presents with  . Hypertension    Pt Friend Casey Fisher accompanying today reports BP good at home they stay fairly consistent.      HPI Casey Fisher presents for  Here with friend Casey Fisher today who is also my patient.  Hypertension: Hydrochlorothiazide 12.5 mg daily Home readings:stable 120-130 range - similar to today's reading. Denies CP/dyspnea/lightheadedness/dizziness  BP Readings from Last 3 Encounters:  05/12/21 122/66  05/07/21 137/67  12/09/20 129/62   Lab Results  Component Value Date   CREATININE 1.14 11/11/2020    Hyperlipidemia: Simvastatin 40 mg daily.   Lab Results  Component Value Date   CHOL 176 11/11/2020   HDL 61 11/11/2020   LDLCALC 104 (H) 11/11/2020   TRIG 54 11/11/2020   CHOLHDL 2.9 11/11/2020   Lab Results  Component Value Date   ALT 8 11/11/2020   AST 11 11/11/2020   ALKPHOS 85 11/11/2020   BILITOT 0.3 11/11/2020    Parkinsonism with neuropathy Followed by neurology, recent note reviewed.  Trial of higher dose of Sinemet. Tolerating higher dose.  Continues on Aricept, Cymbalta.  Gabapentin for neuropathy. Feels down at times No SI. Not seeing family as much. Daughter comes over once per week.   Depression screen [facility] 2/9 05/12/2021 11/11/2020 01/25/2020 01/19/2020 12/06/2019  Decreased Interest 0 0 0 0 0  Down, Depressed, Hopeless 0 0 0 0 0  PHQ - 2 Score 0 0 0 0 0  Some recent data might be hidden    Followed by urology with history of prostate cancer, treated with Lupron injections, Flomax. Some concern for possible stricture in 10/2020 visit. I/o cath - declined persistent catheter. Urinating well - denies retention/diificulty with initiation.   Due for 4th covid 19 booster.  Immunization History  Administered Date(s) Administered  . Fluad Quad(high Dose 65+) 10/05/2019, 11/11/2020  . Influenza,  High Dose Seasonal PF 11/21/2018  . Influenza,inj,Quad PF,6+ Mos 12/18/2013, 11/08/2014, 12/04/2015, 11/03/2016, 09/16/2017  . PFIZER(Purple Top)SARS-COV-2 Vaccination 02/01/2020, 02/26/2020, 09/30/2020  . Pneumococcal Conjugate-13 04/18/2015  . Pneumococcal Polysaccharide-23 07/15/2015     History Patient Active Problem List   Diagnosis Date Noted  . Parkinson's disease ([place]) 05/07/2021  . Primary osteoarthritis of right knee 05/23/2020  . Low back pain 08/03/2019  . Acute pain of left shoulder 10/10/2018  . Chest pain 11/08/2017  . Memory difficulty 03/24/2016  . Hereditary and idiopathic peripheral neuropathy 03/24/2016  . Abnormality of gait 03/24/2016  . Peripheral neuropathy 04/18/2015  . Hyperlipidemia 04/18/2015  . Prostate cancer ([place]) 08/24/2014  . Right inguinal hernia 10/30/2013   Past Medical History:  Diagnosis Date  . Abnormality of gait 03/24/2016  . Anxiety   . Cancer ([place])   . Cataract   . Depression   . Heart murmur   . Hereditary and idiopathic peripheral neuropathy 03/24/2016  . Hypertension   . Memory difficulty 03/24/2016  . Neuromuscular disorder ([place])   . Neuropathy   . Parkinson's disease ([place]) 05/07/2021  . Prostate cancer ([place])    remission   Past Surgical History:  Procedure Laterality Date  . CATARACT EXTRACTION Bilateral   . EYE SURGERY    . HERNIA REPAIR  1985  . INGUINAL HERNIA REPAIR Right 12/06/2013   Procedure: HERNIA REPAIR INGUINAL ADULT;  Surgeon: Merrie Roof, MD;  Location: [facility];  Service: General;  Laterality:  Right;  Mar[place] Kitchen INSERTION OF MESH Right 12/06/2013   Procedure: INSERTION OF MESH;  Surgeon: Merrie Roof, MD;  Location: [facility];  Service: General;  Laterality: Right;  . PROSTATE SURGERY     Allergies  Allergen Reactions  . Lyrica [Pregabalin] Other (See Comments)    "constipation"   Prior to Admission medications   Medication Sig Start Date End Date Taking? Authorizing Provider  ammonium lactate (AMLACTIN) 12 %  lotion Apply to both feet twice daily for dry skin 05/25/19  Yes Galaway, Stephani Police, DPM  aspirin EC 81 MG tablet Take 81 mg by mouth daily. Reported on 05/14/2016   Yes [provider]  Calcium Carb-Cholecalciferol (CALCIUM 600 + D PO) Take 1 tablet by mouth daily.   Yes [provider]  carbidopa-levodopa (SINEMET) 25-250 MG tablet Take 1 tablet by mouth 3 (three) times daily. 05/07/21  Yes Kathrynn Ducking, MD  donepezil (ARICEPT) 5 MG tablet Take 1 tablet (5 mg total) by mouth at bedtime. 03/12/21  Yes Suzzanne Cloud, NP  DULoxetine (CYMBALTA) 30 MG capsule Take 1 capsule (30 mg total) by mouth 2 (two) times daily. 05/01/21  Yes Suzzanne Cloud, NP  ERLEADA 60 MG tablet Take 120 mg by mouth daily.  06/06/18  Yes [provider]  gabapentin (NEURONTIN) 600 MG tablet TAKE 2 TABLETS BY MOUTH IN THE MORNING AND TAKE 2 TABLETS BY MOUTH AT MIDDAY AND TAKE 1 TABLET IN THE EVENING 11/11/20  Yes [name], MD  hydrochlorothiazide (HYDRODIURIL) 12.5 MG tablet TAKE 1 TABLET(12.5 MG) BY MOUTH DAILY 11/11/20  Yes [name], MD  Leuprolide Acetate (LUPRON IJ) Inject 1 application as directed every 6 (six) months.   Yes [provider]  simvastatin (ZOCOR) 40 MG tablet TAKE 1 TABLET(40 MG) BY MOUTH DAILY 02/21/21  Yes [name], MD  tamsulosin (FLOMAX) 0.4 MG CAPS capsule Take 0.4 mg daily by mouth. Reported on 05/14/2016 08/17/15  Yes [provider]   Social History   Socioeconomic History  . Marital status: Widowed    Spouse name: Not on file  . Number of children: Not on file  . Years of education: Not on file  . Highest education level: Not on file  Occupational History  . Not on file  Tobacco Use  . Smoking status: Former Research scientist (life sciences)  . Smokeless tobacco: Never Used  Vaping Use  . Vaping Use: Never used  Substance and Sexual Activity  . Alcohol use: No  . Drug use: No  . Sexual activity: Not on file  Other Topics Concern  . Not on  file  Social History Narrative   Lives home alone.  [name] from church transports him   Right Handed   Drinks no caffeine   Social Determinants of Radio broadcast assistant Strain: Not on file  Food Insecurity: Not on file  Transportation Needs: Not on file  Physical Activity: Not on file  Stress: Not on file  Social Connections: Not on file  Intimate Partner Violence: Not on file    Review of Systems  Constitutional: Negative for fatigue and unexpected weight change.  Eyes: Negative for visual disturbance.  Respiratory: Negative for cough, chest tightness and shortness of breath.   Cardiovascular: Negative for chest pain, palpitations and leg swelling.  Gastrointestinal: Negative for abdominal pain and blood in stool.  Neurological: Negative for dizziness, light-headedness and headaches.     Objective:   Vitals:   05/12/21 1316  BP: 122/66  Pulse: 86  Resp: 17  Temp: 98.4 F (36.9 C)  TempSrc: Temporal  SpO2: 94%  Height: 6\' 2"  (1.88 m)     Physical Exam Vitals reviewed.  Constitutional:      Appearance: He is well-developed.  HENT:     Head: Normocephalic and atraumatic.  Eyes:     Pupils: Pupils are equal, round, and reactive to light.  Neck:     Vascular: No carotid bruit or JVD.  Cardiovascular:     Rate and Rhythm: Normal rate and regular rhythm.     Heart sounds: Normal heart sounds. No murmur heard.   Pulmonary:     Effort: Pulmonary effort is normal.     Breath sounds: Normal breath sounds. No rales.  Musculoskeletal:     Right lower leg: No edema.     Left lower leg: No edema.  Skin:    General: Skin is warm and dry.  Neurological:     Mental Status: He is alert and oriented to person, place, and time.   31 minutes spent during visit, greater than 50% counseling and assimilation of information, chart review, and discussion of plan.    Assessment & Plan:  MANJOT HINKS is a 87 y.o. male . Hyperlipidemia, unspecified  hyperlipidemia type - Plan: simvastatin (ZOCOR) 40 MG tablet  -  Stable, tolerating current regimen. Medications refilled. Labs pending as above.   Essential hypertension - Plan: hydrochlorothiazide (HYDRODIURIL) 12.5 MG tablet  -  Stable, tolerating current regimen. Medications refilled. Labs pending as above.   Parkinsonism, unspecified Parkinsonism type ([place]) Peripheral polyneuropathy - Plan: gabapentin (NEURONTIN) 600 MG tablet  -Suspect component of loneliness, no true depression.  Does have some family, friends/neighbors that can check on him.  Presents with 1 neighbor today who does see him frequently.  Discussed change in Cymbalta but with recent change in Sinemet deferred other changes at this time.  RTC precautions in the next 1 to 2 months if more depressive symptoms.  No other changes at this time.  Continue gabapentin for peripheral neuropathy, follow-up with neurology as planned.  Health maintenance Second COVID booster recommended.  Meds ordered this encounter  Medications  . gabapentin (NEURONTIN) 600 MG tablet    Sig: TAKE 2 TABLETS BY MOUTH IN THE MORNING AND TAKE 2 TABLETS BY MOUTH AT MIDDAY AND TAKE 1 TABLET IN THE EVENING    Dispense:  450 tablet    Refill:  1  . hydrochlorothiazide (HYDRODIURIL) 12.5 MG tablet    Sig: TAKE 1 TABLET(12.5 MG) BY MOUTH DAILY    Dispense:  90 tablet    Refill:  1  . simvastatin (ZOCOR) 40 MG tablet    Sig: TAKE 1 TABLET(40 MG) BY MOUTH DAILY    Dispense:  90 tablet    Refill:  1   Patient Instructions  If you are feeling more down or depressed, please let me know as we can talk about adjusting your Cymbalta. I would also like to see if higher dose of Sinemet will make you feel better as well.        Signed, Merri Ray, MD [facility]

## 2021-05-12 NOTE — Patient Instructions (Addendum)
If you are feeling more down or depressed, please let me know as we can talk about adjusting your Cymbalta. I would also like to see if higher dose of Sinemet will make you feel better as well.

## 2021-05-28 DIAGNOSIS — C61 Malignant neoplasm of prostate: Secondary | ICD-10-CM | POA: Diagnosis not present

## 2021-05-28 DIAGNOSIS — Z192 Hormone resistant malignancy status: Secondary | ICD-10-CM | POA: Diagnosis not present

## 2021-05-30 ENCOUNTER — Telehealth: Payer: Self-pay | Admitting: Neurology

## 2021-05-30 NOTE — Telephone Encounter (Signed)
Pt's daughter [name] on [place] called needing to speak to the RN regarding getting the pt home aid. Daughter stated nurse can leave a VM if she does not pick up. Please advise.

## 2021-05-31 ENCOUNTER — Emergency Department (HOSPITAL_COMMUNITY): Payer: Medicare Other

## 2021-05-31 ENCOUNTER — Emergency Department (HOSPITAL_COMMUNITY)
Admission: EM | Admit: 2021-05-31 | Discharge: 2021-05-31 | Disposition: A | Discharge status: Home / Self Care | Payer: Medicare Other | Attending: Emergency Medicine | Admitting: Emergency Medicine

## 2021-05-31 ENCOUNTER — Other Ambulatory Visit: Payer: Self-pay

## 2021-05-31 ENCOUNTER — Encounter (HOSPITAL_COMMUNITY): Payer: Self-pay | Admitting: Emergency Medicine

## 2021-05-31 DIAGNOSIS — Z043 Encounter for examination and observation following other accident: Secondary | ICD-10-CM | POA: Diagnosis not present

## 2021-05-31 DIAGNOSIS — Y92009 Unspecified place in unspecified non-institutional (private) residence as the place of occurrence of the external cause: Secondary | ICD-10-CM | POA: Diagnosis not present

## 2021-05-31 DIAGNOSIS — G20 Parkinson's disease: Secondary | ICD-10-CM | POA: Diagnosis not present

## 2021-05-31 DIAGNOSIS — W19XXXA Unspecified fall, initial encounter: Secondary | ICD-10-CM | POA: Diagnosis not present

## 2021-05-31 DIAGNOSIS — I6529 Occlusion and stenosis of unspecified carotid artery: Secondary | ICD-10-CM | POA: Diagnosis not present

## 2021-05-31 DIAGNOSIS — S0990XA Unspecified injury of head, initial encounter: Secondary | ICD-10-CM | POA: Diagnosis not present

## 2021-05-31 DIAGNOSIS — S199XXA Unspecified injury of neck, initial encounter: Secondary | ICD-10-CM | POA: Diagnosis not present

## 2021-05-31 DIAGNOSIS — Z87891 Personal history of nicotine dependence: Secondary | ICD-10-CM | POA: Insufficient documentation

## 2021-05-31 DIAGNOSIS — Z79899 Other long term (current) drug therapy: Secondary | ICD-10-CM | POA: Diagnosis not present

## 2021-05-31 DIAGNOSIS — I10 Essential (primary) hypertension: Secondary | ICD-10-CM | POA: Insufficient documentation

## 2021-05-31 DIAGNOSIS — R413 Other amnesia: Secondary | ICD-10-CM | POA: Insufficient documentation

## 2021-05-31 DIAGNOSIS — R531 Weakness: Secondary | ICD-10-CM | POA: Diagnosis not present

## 2021-05-31 DIAGNOSIS — R911 Solitary pulmonary nodule: Secondary | ICD-10-CM | POA: Diagnosis not present

## 2021-05-31 DIAGNOSIS — Z7982 Long term (current) use of aspirin: Secondary | ICD-10-CM | POA: Insufficient documentation

## 2021-05-31 DIAGNOSIS — Z8546 Personal history of malignant neoplasm of prostate: Secondary | ICD-10-CM | POA: Diagnosis not present

## 2021-05-31 DIAGNOSIS — I499 Cardiac arrhythmia, unspecified: Secondary | ICD-10-CM | POA: Diagnosis not present

## 2021-05-31 DIAGNOSIS — R0902 Hypoxemia: Secondary | ICD-10-CM | POA: Diagnosis not present

## 2021-05-31 LAB — COMPREHENSIVE METABOLIC PANEL
ALT: 21 U/L (ref 0–44)
AST: 52 U/L — ABNORMAL HIGH (ref 15–41)
Albumin: 3.9 g/dL (ref 3.5–5.0)
Alkaline Phosphatase: 55 U/L (ref 38–126)
Anion gap: 9 (ref 5–15)
BUN: 15 mg/dL (ref 8–23)
CO2: 27 mmol/L (ref 22–32)
Calcium: 8.8 mg/dL — ABNORMAL LOW (ref 8.9–10.3)
Chloride: 102 mmol/L (ref 98–111)
Creatinine, Ser: 1.07 mg/dL (ref 0.61–1.24)
GFR, Estimated: >60 mL/min (ref >60)
Glucose, Bld: 101 mg/dL — ABNORMAL HIGH (ref 70–99)
Potassium: 3.2 mmol/L — ABNORMAL LOW (ref 3.5–5.1)
Sodium: 138 mmol/L (ref 135–145)
Total Bilirubin: 0.8 mg/dL (ref 0.3–1.2)
Total Protein: 7.5 g/dL (ref 6.5–8.1)

## 2021-05-31 LAB — CBC WITH DIFFERENTIAL/PLATELET
Abs Immature Granulocytes: 0.01 10*3/uL (ref 0.00–0.07)
Basophils Absolute: 0 10*3/uL (ref 0.0–0.1)
Basophils Relative: 0 %
Eosinophils Absolute: 0 10*3/uL (ref 0.0–0.5)
Eosinophils Relative: 1 %
HCT: 28 % — ABNORMAL LOW (ref 39.0–52.0)
Hemoglobin: 8.6 g/dL — ABNORMAL LOW (ref 13.0–17.0)
Immature Granulocytes: 0 %
Lymphocytes Relative: 14 %
Lymphs Abs: 0.6 10*3/uL — ABNORMAL LOW (ref 0.7–4.0)
MCH: 27.7 pg (ref 26.0–34.0)
MCHC: 30.7 g/dL (ref 30.0–36.0)
MCV: 90 fL (ref 80.0–100.0)
Monocytes Absolute: 0.4 10*3/uL (ref 0.1–1.0)
Monocytes Relative: 9 %
Neutro Abs: 3.4 10*3/uL (ref 1.7–7.7)
Neutrophils Relative %: 76 %
Platelets: 116 10*3/uL — ABNORMAL LOW (ref 150–400)
RBC: 3.11 MIL/uL — ABNORMAL LOW (ref 4.22–5.81)
RDW: 14.3 % (ref 11.5–15.5)
WBC: 4.4 10*3/uL (ref 4.0–10.5)
nRBC: 0 % (ref 0.0–0.2)

## 2021-05-31 MED ORDER — CARBIDOPA-LEVODOPA 25-250 MG PO TABS: 1.0000 | ORAL_TABLET | Freq: Three times a day (TID) | ORAL | Status: DC

## 2021-05-31 MED ORDER — TAMSULOSIN HCL 0.4 MG PO CAPS: 0.4000 mg | ORAL_CAPSULE | Freq: Every day | ORAL | Status: DC

## 2021-05-31 MED ORDER — ASPIRIN EC 81 MG PO TBEC: 81.0000 mg | DELAYED_RELEASE_TABLET | Freq: Every day | ORAL | Status: DC

## 2021-05-31 MED ORDER — HYDROCHLOROTHIAZIDE 25 MG PO TABS: 12.5000 mg | ORAL_TABLET | Freq: Every day | ORAL | Status: DC

## 2021-05-31 MED ORDER — DONEPEZIL HCL 5 MG PO TABS: 5.0000 mg | ORAL_TABLET | Freq: Every day | ORAL | Status: DC

## 2021-05-31 MED ORDER — GABAPENTIN 600 MG PO TABS: 600.0000 mg | ORAL_TABLET | Freq: Two times a day (BID) | ORAL | Status: DC

## 2021-05-31 MED ORDER — DULOXETINE HCL 30 MG PO CPEP: 30.0000 mg | ORAL_CAPSULE | Freq: Two times a day (BID) | ORAL | Status: DC

## 2021-05-31 MED ORDER — SIMVASTATIN 20 MG PO TABS: 40.0000 mg | ORAL_TABLET | Freq: Every day | ORAL | Status: DC

## 2021-05-31 MED ORDER — APALUTAMIDE 60 MG PO TABS: 120.0000 mg | ORAL_TABLET | Freq: Every day | ORAL | Status: DC

## 2021-05-31 NOTE — TOC Initial Note (Signed)
Transition of Care [facility]) - Initial/Assessment Note    Patient Details  Name: Casey Fisher MRN: [id number] Date of Birth: 04-29-1928  [facility] CM/SW Contact:    Verdell Carmine, RN Phone Number: 05/31/2021, 4:53 PM  Clinical Narrative:                 87 year old patient in with a fall, he declines SNF Has wife and daughter. CSW answering questions, chart reviewed DME ordered wheelchair from [place], and [place] secured through [employer]. .   Expected Discharge Plan: [place]     Patient Goals and CMS Choice        Expected Discharge Plan and Services Expected Discharge Plan: [place]   Discharge Planning Services: CM Consult Post Acute Care Choice: [place] arrangements for the past 2 months: Single Family Home                 DME Arranged: Youth worker wheelchair with seat cushion DME Agency: [employer] Date DME Agency Contacted: 05/31/21 Time DME Agency Contacted: 1500 Representative spoke with at DME Agency: [place]: PT,OT,Nurse's [place] [employer] Agency: [name] Date [place]: 05/31/21 Time [place]: 1530 Representative spoke with at [place]: Adela Lank  Prior Living Arrangements/Services Living arrangements for the past 2 months: [place] with:: Adult Children Patient language and need for interpreter reviewed:: Yes Do you feel safe going back to the place where you live?: Yes      Need for Family Participation in Patient Care: Yes (Comment) Care giver support system in place?: Yes (comment)   Criminal Activity/Legal Involvement Pertinent to Current Situation/Hospitalization: No - Comment as needed  Activities of Daily Living      Permission Sought/Granted                  Emotional Assessment       Orientation: : Fluctuating Orientation (Suspected and/or reported Sundowners) Alcohol / Substance  Use: Not Applicable Psych Involvement: No (comment)  Admission diagnosis:  fall Patient Active Problem List   Diagnosis Date Noted  . Parkinson's disease ([place]) 05/07/2021  . Primary osteoarthritis of right knee 05/23/2020  . Low back pain 08/03/2019  . Acute pain of left shoulder 10/10/2018  . Chest pain 11/08/2017  . Memory difficulty 03/24/2016  . Hereditary and idiopathic peripheral neuropathy 03/24/2016  . Abnormality of gait 03/24/2016  . Peripheral neuropathy 04/18/2015  . Hyperlipidemia 04/18/2015  . Prostate cancer ([place]) 08/24/2014  . Right inguinal hernia 10/30/2013   PCP:  [name], MD Pharmacy:   [facility] [place], [place] AT [address] [address] Phone: [phone] Fax: [phone]     Social Determinants of Health (SDOH) Interventions    Readmission Risk Interventions No flowsheet data found.

## 2021-05-31 NOTE — ED Notes (Signed)
Patient was given his dinner tray. Patients daughter was given a sandwich and a sprite.

## 2021-05-31 NOTE — ED Provider Notes (Signed)
[facility] DEPT Provider Note   CSN: [id number] Arrival date & time: 05/31/21  1028     History No chief complaint on file.   RUSHTON EARLY is a 87 y.o. male.  HPI Patient presents via EMS after a possible fall.  The patient himself has memory loss, level 5 caveat.  He states that today is Tuesday, and he last fell on Sunday.  Today is Saturday, and he reportedly fell at about 9 AM today.  This history is provided by EMS via neighbor.  The patient denies pain, denies weakness, but has no insight into his current presentation.  Per EMS the patient was awake and alert, talkative in transport.      Past Medical History:  Diagnosis Date  . Abnormality of gait 03/24/2016  . Anxiety   . Cancer ([place])   . Cataract   . Depression   . Heart murmur   . Hereditary and idiopathic peripheral neuropathy 03/24/2016  . Hypertension   . Memory difficulty 03/24/2016  . Neuromuscular disorder ([place])   . Neuropathy   . Parkinson's disease ([place]) 05/07/2021  . Prostate cancer [facility])    remission    Patient Active Problem List   Diagnosis Date Noted  . Parkinson's disease ([place]) 05/07/2021  . Primary osteoarthritis of right knee 05/23/2020  . Low back pain 08/03/2019  . Acute pain of left shoulder 10/10/2018  . Chest pain 11/08/2017  . Memory difficulty 03/24/2016  . Hereditary and idiopathic peripheral neuropathy 03/24/2016  . Abnormality of gait 03/24/2016  . Peripheral neuropathy 04/18/2015  . Hyperlipidemia 04/18/2015  . Prostate cancer ([place]) 08/24/2014  . Right inguinal hernia 10/30/2013    Past Surgical History:  Procedure Laterality Date  . CATARACT EXTRACTION Bilateral   . EYE SURGERY    . HERNIA REPAIR  1985  . INGUINAL HERNIA REPAIR Right 12/06/2013   Procedure: HERNIA REPAIR INGUINAL ADULT;  Surgeon: Merrie Roof, MD;  Location: [facility];  Service: General;  Laterality: Right;  . INSERTION OF MESH Right 12/06/2013   Procedure: INSERTION OF  MESH;  Surgeon: Merrie Roof, MD;  Location: [facility];  Service: General;  Laterality: Right;  . PROSTATE SURGERY         Family History  Problem Relation Age of Onset  . Brain cancer Daughter   . Heart disease Mother   . Stroke Mother   . Heart disease Father     Social History   Tobacco Use  . Smoking status: Former Research scientist (life sciences)  . Smokeless tobacco: Never Used  Vaping Use  . Vaping Use: Never used  Substance Use Topics  . Alcohol use: No  . Drug use: No    Home Medications Prior to Admission medications   Medication Sig Start Date End Date Taking? Authorizing Provider  ammonium lactate (AMLACTIN) 12 % lotion Apply to both feet twice daily for dry skin 05/25/19   Marzetta Board, DPM  aspirin EC 81 MG tablet Take 81 mg by mouth daily. Reported on 05/14/2016    [provider]  Calcium Carb-Cholecalciferol (CALCIUM 600 + D PO) Take 1 tablet by mouth daily.    [provider]  carbidopa-levodopa (SINEMET) 25-250 MG tablet Take 1 tablet by mouth 3 (three) times daily. 05/07/21   Kathrynn Ducking, MD  donepezil (ARICEPT) 5 MG tablet Take 1 tablet (5 mg total) by mouth at bedtime. 03/12/21   Suzzanne Cloud, NP  DULoxetine (CYMBALTA) 30 MG capsule Take 1  capsule (30 mg total) by mouth 2 (two) times daily. 05/01/21   Suzzanne Cloud, NP  ERLEADA 60 MG tablet Take 120 mg by mouth daily.  06/06/18   [provider]  gabapentin (NEURONTIN) 600 MG tablet TAKE 2 TABLETS BY MOUTH IN THE MORNING AND TAKE 2 TABLETS BY MOUTH AT MIDDAY AND TAKE 1 TABLET IN THE EVENING 05/12/21   [name], MD  hydrochlorothiazide (HYDRODIURIL) 12.5 MG tablet TAKE 1 TABLET(12.5 MG) BY MOUTH DAILY 05/12/21   [name], MD  Leuprolide Acetate (LUPRON IJ) Inject 1 application as directed every 6 (six) months.    [provider]  simvastatin (ZOCOR) 40 MG tablet TAKE 1 TABLET(40 MG) BY MOUTH DAILY 05/12/21   [name], MD  tamsulosin (FLOMAX) 0.4 MG CAPS capsule  Take 0.4 mg daily by mouth. Reported on 05/14/2016 08/17/15   [provider]    Allergies    Lyrica [pregabalin]  Review of Systems   Review of Systems  Unable to perform ROS: Dementia    Physical Exam Updated Vital Signs BP (!) 148/83   Pulse (!) 55   Temp 98.6 F (37 C)   Resp 15   SpO2 96%   Physical Exam Vitals and nursing note reviewed.  Constitutional:      General: He is not in acute distress.    Appearance: He is well-developed. He is not ill-appearing.  HENT:     Head: Normocephalic and atraumatic.  Eyes:     Conjunctiva/sclera: Conjunctivae normal.  Neck:     Comments: Cervical collar in place, no gross deformities. Cardiovascular:     Rate and Rhythm: Normal rate and regular rhythm.  Pulmonary:     Effort: Pulmonary effort is normal. No respiratory distress.     Breath sounds: No stridor.  Abdominal:     General: There is no distension.  Musculoskeletal:     Comments: No obvious deformities.  Patient can flex each hip independently to command, though seemingly has some limit, secondary to pain, possibly.  Patient moves both arms spontaneously, has no complaints in either of these extremities.  Distal lower extremities without deformity.  Skin:    General: Skin is warm and dry.  Neurological:     Mental Status: He is alert.     Comments: Atrophy, no facial asymmetry, speech is clear, brief, but not necessarily appropriate.  He follows commands appropriately  Psychiatric:     Comments: Impaired cognition and memory     ED Results / Procedures / Treatments   Labs (all labs ordered are listed, but only abnormal results are displayed) Labs Reviewed  CBC WITH DIFFERENTIAL/PLATELET - Abnormal; Notable for the following components:      Result Value   RBC 3.11 (*)    Hemoglobin 8.6 (*)    HCT 28.0 (*)    Platelets 116 (*)    Lymphs Abs 0.6 (*)    All other components within normal limits  COMPREHENSIVE METABOLIC PANEL - Abnormal; Notable for  the following components:   Potassium 3.2 (*)    Glucose, Bld 101 (*)    Calcium 8.8 (*)    AST 52 (*)    All other components within normal limits    EKG None  Radiology CT Head Wo Contrast  Result Date: 05/31/2021 CLINICAL DATA:  On witnessed fall with trauma to the head and neck. Patient has a history of prostate cancer. EXAM: CT HEAD WITHOUT CONTRAST CT CERVICAL SPINE WITHOUT CONTRAST TECHNIQUE: Multidetector  CT imaging of the head and cervical spine was performed following the standard protocol without intravenous contrast. Multiplanar CT image reconstructions of the cervical spine were also generated. COMPARISON:  Multiple chest CTs performed between 01/17/2014 and 10/29/2016, MR cervical spine dated 09/26/2005. FINDINGS: CT HEAD FINDINGS Brain: No evidence of acute infarction, hemorrhage, hydrocephalus, extra-axial collection or mass lesion/mass effect. There is mild to moderate cerebral volume loss with associated ex vacuo dilatation. Periventricular white matter hypoattenuation likely represents chronic small vessel ischemic disease. Vascular: There are vascular calcifications in the carotid siphons. Skull: Normal. Negative for fracture or focal lesion. Sinuses/Orbits: No acute finding. Other: None. CT CERVICAL SPINE FINDINGS Alignment: Normal. Skull base and vertebrae: No acute fracture. No primary bone lesion or focal pathologic process. Soft tissues and spinal canal: No prevertebral fluid or swelling. No visible canal hematoma. Disc levels: Moderate to severe multilevel degenerative disc and joint disease. Upper chest: A 4 mm solid pulmonary nodule in the right upper lobe is noted. On prior exams, this pulmonary nodule may have been present, but smaller in size. Other: None. IMPRESSION: 1. No acute intracranial process. 2. No acute osseous injury in the cervical spine. 3. Pulmonary nodule in the right upper lobe. Non-contrast chest CT can be considered in 12 months given the patient's  history of prostate cancer. This recommendation follows the consensus statement: Guidelines for Management of Incidental Pulmonary Nodules Detected on CT Images: From the Fleischner Society 2017; Radiology 2017; 284:228-243. Electronically Signed   By: Zerita Boers M.D.   On: 05/31/2021 12:22   CT Cervical Spine Wo Contrast  Result Date: 05/31/2021 CLINICAL DATA:  On witnessed fall with trauma to the head and neck. Patient has a history of prostate cancer. EXAM: CT HEAD WITHOUT CONTRAST CT CERVICAL SPINE WITHOUT CONTRAST TECHNIQUE: Multidetector CT imaging of the head and cervical spine was performed following the standard protocol without intravenous contrast. Multiplanar CT image reconstructions of the cervical spine were also generated. COMPARISON:  Multiple chest CTs performed between 01/17/2014 and 10/29/2016, MR cervical spine dated 09/26/2005. FINDINGS: CT HEAD FINDINGS Brain: No evidence of acute infarction, hemorrhage, hydrocephalus, extra-axial collection or mass lesion/mass effect. There is mild to moderate cerebral volume loss with associated ex vacuo dilatation. Periventricular white matter hypoattenuation likely represents chronic small vessel ischemic disease. Vascular: There are vascular calcifications in the carotid siphons. Skull: Normal. Negative for fracture or focal lesion. Sinuses/Orbits: No acute finding. Other: None. CT CERVICAL SPINE FINDINGS Alignment: Normal. Skull base and vertebrae: No acute fracture. No primary bone lesion or focal pathologic process. Soft tissues and spinal canal: No prevertebral fluid or swelling. No visible canal hematoma. Disc levels: Moderate to severe multilevel degenerative disc and joint disease. Upper chest: A 4 mm solid pulmonary nodule in the right upper lobe is noted. On prior exams, this pulmonary nodule may have been present, but smaller in size. Other: None. IMPRESSION: 1. No acute intracranial process. 2. No acute osseous injury in the cervical  spine. 3. Pulmonary nodule in the right upper lobe. Non-contrast chest CT can be considered in 12 months given the patient's history of prostate cancer. This recommendation follows the consensus statement: Guidelines for Management of Incidental Pulmonary Nodules Detected on CT Images: From the Fleischner Society 2017; Radiology 2017; 284:228-243. Electronically Signed   By: Zerita Boers M.D.   On: 05/31/2021 12:22   DG Pelvis Portable  Result Date: 05/31/2021 CLINICAL DATA:  Unwitnessed fall at home, history dementia, prostate cancer EXAM: PORTABLE PELVIS 1-2 VIEWS COMPARISON:  06/29/2019 FINDINGS:  Seed implants at prostate gland. Mild osseous demineralization. Hip and SI joint spaces preserved. No fracture, dislocation, or bone destruction. Questionable area of sclerosis at the proximal RIGHT femur at intertrochanteric region 3.8 x 3.6 cm, cannot exclude osseous metastasis. IMPRESSION: No acute osseous abnormalities. Questionable sclerotic focus at intertrochanteric region RIGHT femur, cannot exclude osseous metastatic disease; consider bone scintigraphy to exclude osseous metastatic disease from prostate cancer to bone. Electronically Signed   By: Lavonia Dana M.D.   On: 05/31/2021 11:37    Procedures Procedures   Medications Ordered in ED Medications - No data to display  ED Course  I have reviewed the triage vital signs and the nursing notes.  Pertinent labs & imaging results that were available during my care of the patient were reviewed by me and considered in my medical decision making (see chart for details).   I reviewed the patient's CT scans, now on repeat exam he is in no distress, stating that he feels good.  Cervical spine collar removed, no ongoing pain in that area.    2:44 PM Patient accompanied by his daughter.  At bedside we discussed his presentation today.  She notes that the patient is adamant about remaining in his own home.  She does have help from community members, but  is requesting additional assistance, and home health referral will be provided. Patient mains in no distress, continues to deny ongoing complaints.  Daughter notes that patient has had slow decline, recently, without acute changes. Absent any ongoing complaints from patient, though with notation of mild anemia in comparison to labs from 4 years ago, patient is appropriate to follow-up with his outpatient providers. MDM Rules/Calculators/A&P    Adult male with multiple medical issues including memory loss, history of prostate cancer, presents from home after mechanical fall.  Patient himself denies any complaints, is hemodynamically unremarkable as, awake, alert, but has gross evidence for decreased functionality. Patient is accompanied by his daughter.  Though his evaluation here is generally reassuring for acute pathology, no fracture, intracranial hemorrhage, patient is found to have mild anemia compared to labs from 4 years ago.  Patient unable to ambulate here, social worker consult placed for assistance with placement. Final Clinical Impression(s) / ED Diagnoses Final diagnoses:  Fall, initial encounter     Carmin Muskrat, MD 05/31/21 1453

## 2021-05-31 NOTE — ED Notes (Signed)
Condom cath placed on patient

## 2021-05-31 NOTE — ED Triage Notes (Signed)
BIBA Per EMS: Pt coming from home with an unwitnessed fall. Hx dementia  9-9:30 when fall occurred  No complaints of pain  18G L AC  Vitals  185/90 71 HR 96% RA 161 CBG

## 2021-05-31 NOTE — Progress Notes (Signed)
CSW spoke with patient and his daughter about going to SNF. Patient was not interested and wanted to go home. Patients daughter asked if home health could be setup. CSW let both patient and daughter know that with home health he will not get that much PT like he would at [facility]. Patient still wanted home health. CSW spoke with case management so referrals could be put in place. Patient will also need a wheelchair and this was ordered. CSW notified to put daughters information on home health referrals to setup appointments.

## 2021-05-31 NOTE — Discharge Instructions (Addendum)
  Today's evaluation has been generally reassuring in terms of possible consequences from the fall.  However, with your ongoing weakness, do not hesitate to return here if you develop any new, or concerning changes.  Otherwise, please follow-up with your physician for further consideration of your decreased mobility and anemia identified here on labs.  Our home health services have been referred, and should visit your house for an assessment.

## 2021-05-31 NOTE — ED Notes (Signed)
Attempted to call daughter with No answer.

## 2021-06-01 NOTE — TOC Transition Note (Signed)
Transition of Care [facility]) - CM/SW Discharge Note   Patient Details  Name: Casey Fisher MRN: [id number] Date of Birth: 20-Oct-1928  [facility] CM/SW Contact:  Verdell Carmine, RN Phone Number: 06/01/2021, 9:44 AM   Clinical Narrative:     Called DME, wheelchair was never brought yesterday, patient DC via [facility] to home. They will send both the wheelchair and 3:1 to home today. .        Patient Goals and CMS Choice        Discharge Placement                       Discharge Plan and Services   Discharge Planning Services: CM Consult Post Acute Care Choice: Home Health          DME Arranged: Lightweight manual wheelchair with seat cushion DME Agency: [employer] Date DME Agency Contacted: 05/31/21 Time DME Agency Contacted: 1500 Representative spoke with at DME Agency: [place]: PT,OT,[facility] [place] [employer] Agency: [place] Date [place]: 05/31/21 Time [place]: 1530 Representative spoke with at [place]: [place] ([place]) Interventions     Readmission Risk Interventions No flowsheet data found.

## 2021-06-02 NOTE — Telephone Encounter (Signed)
Spoke to patient's daughter.  She said she is needed a personal aid for her dad.  Advised patient to contact his PCP that they have resources and information regarding personal aids.  Patient denied further questions, verbalized understanding and expressed appreciation for the phone call.

## 2021-06-05 ENCOUNTER — Telehealth (INDEPENDENT_AMBULATORY_CARE_PROVIDER_SITE_OTHER): Payer: Medicare Other | Admitting: Family Medicine

## 2021-06-05 DIAGNOSIS — G20 Parkinson's disease: Secondary | ICD-10-CM | POA: Diagnosis not present

## 2021-06-05 DIAGNOSIS — Z9181 History of falling: Secondary | ICD-10-CM

## 2021-06-05 DIAGNOSIS — D649 Anemia, unspecified: Secondary | ICD-10-CM

## 2021-06-05 DIAGNOSIS — R300 Dysuria: Secondary | ICD-10-CM | POA: Diagnosis not present

## 2021-06-05 DIAGNOSIS — R400 Somnolence: Secondary | ICD-10-CM

## 2021-06-05 DIAGNOSIS — E876 Hypokalemia: Secondary | ICD-10-CM | POA: Diagnosis not present

## 2021-06-05 DIAGNOSIS — G20C Parkinsonism, unspecified: Secondary | ICD-10-CM

## 2021-06-05 NOTE — Progress Notes (Signed)
Virtual Visit via Video Note  I connected with Casey Fisher on 06/05/21 at 12:52 PM by a video enabled telemedicine application and verified that I am speaking with the correct person using two identifiers. 12 min chart review.  Patient location: Home,  speaking on phone with grandaughter in law - [place]. few other family members present as well - permission obtained.  My location: [facility] office   I discussed the limitations, risks, security and privacy concerns of performing an evaluation and management service by telephone and the availability of in person appointments. I also discussed with the patient that there may be a patient responsible charge related to this service. The patient expressed understanding and agreed to proceed, consent obtained  Chief complaint:  Chief Complaint  Patient presents with   Mobility issues    Pt having decreased mobility, unable to get up and walk, pt does live alone, cannot care for himself neighbors help him get dressed bath and ensure he is fed, requesting increase to 8 hours daily for home health      History of Present Illness: Casey Fisher is a 87 y.o. male  Mobility issues, fall at home.  History of Parkinson's with dementia. Follow-up from ER visit on June 4.  History of memory loss.  Some difficulty with timing of fall at that time, but apparently had fallen at 9 AM that morning.  Neighbor provided history.  Denied any pain weakness but no insight into the current presentation.   He had imaging of pelvis without acute osseous abnormalities.  There is a questionable sclerotic focus at the intertrochanteric region of the right femur, bone scan discussed as option to rule out metastatic disease from prostate cancer.   CT head and cervical spine without sign of acute hemorrhage, infarction, or mass-effect.  Mild to moderate cerebral volume loss.  Chronic small vessel ischemic disease.  No acute osseous injury in the cervical  spine.  There was a pulmonary nodule in the right upper lobe, 4 mm, recommended CT chest in 12 months. CMP indicated hypokalemia with potassium of 3.2, borderline calcium 8.8.  AST borderline elevated at 52 but remainder of LFTs were normal.  Glucose borderline elevated at 101.  Hemoglobin was low at 8.6 compared to previous reading of 12.1 in November 2018.  Platelets were low at 116.  Previous level 171 in 2018. On ER eval, he was in no distress, no ongoing pain.  Daughter was present and noted slow decline without acute changes.  Plan for outpatient follow-up of anemia/labs.  With difficulty with mobility/ambulation,Requesting home health evaluation and assistance at 8 hours/day.  Social worker note reviewed from June 4.  Discussed skilled nursing facility but not interested.  Plan for continued home health. Wheelchair was ordered - has not received yet. Also working on getting hospital bed and bedside commode.  Home health comes out now [name] and [name] had been helping as well)- [facility] health care- in morning and evening for about 6 hours per day. Requesting 8 hours per day 7 days per week.  Some chronic shoulder pain - no new pain. Seems to have less mobility and more falls.  [name] asked about possible UTI - [name] states he has some burning with urination. Has had some progressive decline per [name], but much worse past weekend. More sleepy this week, dozing off more this past week.  No fever.   Patient Active Problem List   Diagnosis Date Noted   Parkinson's disease ([place]) 05/07/2021  Primary osteoarthritis of right knee 05/23/2020   Low back pain 08/03/2019   Acute pain of left shoulder 10/10/2018   Chest pain 11/08/2017   Memory difficulty 03/24/2016   Hereditary and idiopathic peripheral neuropathy 03/24/2016   Abnormality of gait 03/24/2016   Peripheral neuropathy 04/18/2015   Hyperlipidemia 04/18/2015   Prostate cancer ([place]) 08/24/2014   Right inguinal hernia 10/30/2013    Past Medical History:  Diagnosis Date   Abnormality of gait 03/24/2016   Anxiety    Cancer (HCC)    Cataract    Depression    Heart murmur    Hereditary and idiopathic peripheral neuropathy 03/24/2016   Hypertension    Memory difficulty 03/24/2016   Neuromuscular disorder ([place])    Neuropathy    Parkinson's disease ([place]) 05/07/2021   Prostate cancer ([place])    remission   Past Surgical History:  Procedure Laterality Date   CATARACT EXTRACTION Bilateral    EYE SURGERY     HERNIA REPAIR  1985   INGUINAL HERNIA REPAIR Right 12/06/2013   Procedure: HERNIA REPAIR INGUINAL ADULT;  Surgeon: Merrie Roof, MD;  Location: [facility];  Service: General;  Laterality: Right;   INSERTION OF MESH Right 12/06/2013   Procedure: INSERTION OF MESH;  Surgeon: Merrie Roof, MD;  Location: [facility];  Service: General;  Laterality: Right;   PROSTATE SURGERY     Allergies  Allergen Reactions   Lyrica [Pregabalin] Other (See Comments)    "constipation"   Prior to Admission medications   Medication Sig Start Date End Date Taking? Authorizing Provider  ammonium lactate (AMLACTIN) 12 % lotion Apply to both feet twice daily for dry skin 05/25/19  Yes Galaway, Stephani Police, DPM  aspirin EC 81 MG tablet Take 81 mg by mouth daily. Reported on 05/14/2016   Yes [provider]  Calcium Carb-Cholecalciferol (CALCIUM 600 + D PO) Take 1 tablet by mouth daily.   Yes [provider]  carbidopa-levodopa (SINEMET) 25-250 MG tablet Take 1 tablet by mouth 3 (three) times daily. 05/07/21  Yes Kathrynn Ducking, MD  donepezil (ARICEPT) 5 MG tablet Take 1 tablet (5 mg total) by mouth at bedtime. 03/12/21  Yes Suzzanne Cloud, NP  DULoxetine (CYMBALTA) 30 MG capsule Take 1 capsule (30 mg total) by mouth 2 (two) times daily. 05/01/21  Yes Suzzanne Cloud, NP  ERLEADA 60 MG tablet Take 120 mg by mouth daily.  06/06/18  Yes [provider]  gabapentin (NEURONTIN) 600 MG tablet TAKE 2 TABLETS BY MOUTH IN THE  MORNING AND TAKE 2 TABLETS BY MOUTH AT MIDDAY AND TAKE 1 TABLET IN THE EVENING 05/12/21  Yes [name], MD  hydrochlorothiazide (HYDRODIURIL) 12.5 MG tablet TAKE 1 TABLET(12.5 MG) BY MOUTH DAILY 05/12/21  Yes [name], MD  Leuprolide Acetate (LUPRON IJ) Inject 1 application as directed every 6 (six) months.   Yes [provider]  simvastatin (ZOCOR) 40 MG tablet TAKE 1 TABLET(40 MG) BY MOUTH DAILY 05/12/21  Yes [name], MD  tamsulosin (FLOMAX) 0.4 MG CAPS capsule Take 0.4 mg daily by mouth. Reported on 05/14/2016 08/17/15  Yes [provider]   Social History   Socioeconomic History   Marital status: Widowed    Spouse name: Not on file   Number of children: Not on file   Years of education: Not on file   Highest education level: Not on file  Occupational History   Not on file  Tobacco Use  Smoking status: Former    Pack years: 0.00   Smokeless tobacco: Never  Vaping Use   Vaping Use: Never used  Substance and Sexual Activity   Alcohol use: No   Drug use: No   Sexual activity: Not on file  Other Topics Concern   Not on file  Social History Narrative   Lives home alone.  [name] from church transports him   Right Handed   Drinks no caffeine   Social Determinants of Radio broadcast assistant Strain: Not on file  Food Insecurity: Not on file  Transportation Needs: Not on file  Physical Activity: Not on file  Stress: Not on file  Social Connections: Not on file  Intimate Partner Violence: Not on file    Observations/Objective: There were no vitals filed for this visit. Somnolent on video -did require some tactile stimulation for patient to respond to me over video, and falls asleep frequently during video.  Appears comfortable.  Assessment and Plan: Somnolence  Parkinsonism, unspecified Parkinsonism type ([place])  History of fall  Dysuria  Anemia, unspecified type  Hypokalemia  Some progressive decline in function  but acute decline over the past week with increased somnolence/ level of responsiveness.  Increasing falls, decreasing mobility.  ER notes reviewed after recent fall/EMS transport with lower hemoglobin than previous, as well as hypokalemia.  Family does report that he is still very somnolent, and this past week has been a significant change.  He does report some dysuria but no abdominal pain.  Some chronic right shoulder pain.  Differential includes delirium, underlying infection, and unsure of timing of decline in hemoglobin.  Also with hypokalemia, not currently on supplementation.  -Recommended repeat ER eval with possible urinalysis, repeat potassium, CBC to evaluate for acute decline.  Differential includes delirium superimposed on dementia, underlying Parkinson's.  Briefly discussed goals of care, and recommended family discuss that further with Mr. Lybrand and I am happy to review that as well.  May benefit from social work consult in [place].  Depending on ER eval, I am happy to follow-up by video outpatient to further discuss goals of care, and try to expand home health coverage if possible.  Understanding expressed of plan and all questions were answered to family over video.   Follow Up Instructions:    I discussed the assessment and treatment plan with the patient. The patient was provided an opportunity to ask questions and all were answered. The patient agreed with the plan and demonstrated an understanding of the instructions.   The patient was advised to call back or seek an in-person evaluation if the symptoms worsen or if the condition fails to improve as anticipated.  I provided  47 min (35 mins on video, 24min chart review) minutes of non-face-to-face time during this encounter.   [name], MD

## 2021-06-07 ENCOUNTER — Emergency Department (HOSPITAL_COMMUNITY): Payer: Medicare Other

## 2021-06-07 ENCOUNTER — Other Ambulatory Visit: Payer: Self-pay

## 2021-06-07 ENCOUNTER — Emergency Department (HOSPITAL_COMMUNITY)
Admission: EM | Admit: 2021-06-07 | Discharge: 2021-06-07 | Disposition: A | Discharge status: Home / Self Care | Payer: Medicare Other | Attending: Emergency Medicine | Admitting: Emergency Medicine

## 2021-06-07 ENCOUNTER — Encounter (HOSPITAL_COMMUNITY): Payer: Self-pay

## 2021-06-07 DIAGNOSIS — C61 Malignant neoplasm of prostate: Secondary | ICD-10-CM | POA: Diagnosis not present

## 2021-06-07 DIAGNOSIS — M1711 Unilateral primary osteoarthritis, right knee: Secondary | ICD-10-CM | POA: Diagnosis not present

## 2021-06-07 DIAGNOSIS — Y92009 Unspecified place in unspecified non-institutional (private) residence as the place of occurrence of the external cause: Secondary | ICD-10-CM | POA: Insufficient documentation

## 2021-06-07 DIAGNOSIS — E785 Hyperlipidemia, unspecified: Secondary | ICD-10-CM | POA: Diagnosis not present

## 2021-06-07 DIAGNOSIS — W19XXXA Unspecified fall, initial encounter: Secondary | ICD-10-CM | POA: Diagnosis not present

## 2021-06-07 DIAGNOSIS — R9082 White matter disease, unspecified: Secondary | ICD-10-CM | POA: Diagnosis not present

## 2021-06-07 DIAGNOSIS — M47812 Spondylosis without myelopathy or radiculopathy, cervical region: Secondary | ICD-10-CM | POA: Diagnosis not present

## 2021-06-07 DIAGNOSIS — Z87891 Personal history of nicotine dependence: Secondary | ICD-10-CM | POA: Diagnosis not present

## 2021-06-07 DIAGNOSIS — F32A Depression, unspecified: Secondary | ICD-10-CM | POA: Diagnosis not present

## 2021-06-07 DIAGNOSIS — G319 Degenerative disease of nervous system, unspecified: Secondary | ICD-10-CM | POA: Diagnosis not present

## 2021-06-07 DIAGNOSIS — Z79899 Other long term (current) drug therapy: Secondary | ICD-10-CM | POA: Insufficient documentation

## 2021-06-07 DIAGNOSIS — Z7982 Long term (current) use of aspirin: Secondary | ICD-10-CM | POA: Insufficient documentation

## 2021-06-07 DIAGNOSIS — G20 Parkinson's disease: Secondary | ICD-10-CM | POA: Insufficient documentation

## 2021-06-07 DIAGNOSIS — R5381 Other malaise: Secondary | ICD-10-CM | POA: Diagnosis not present

## 2021-06-07 DIAGNOSIS — M5030 Other cervical disc degeneration, unspecified cervical region: Secondary | ICD-10-CM | POA: Diagnosis not present

## 2021-06-07 DIAGNOSIS — F0280 Dementia in other diseases classified elsewhere without behavioral disturbance: Secondary | ICD-10-CM | POA: Diagnosis not present

## 2021-06-07 DIAGNOSIS — T1490XA Injury, unspecified, initial encounter: Secondary | ICD-10-CM | POA: Diagnosis present

## 2021-06-07 DIAGNOSIS — Z9181 History of falling: Secondary | ICD-10-CM | POA: Diagnosis not present

## 2021-06-07 DIAGNOSIS — I10 Essential (primary) hypertension: Secondary | ICD-10-CM | POA: Diagnosis not present

## 2021-06-07 DIAGNOSIS — Z8546 Personal history of malignant neoplasm of prostate: Secondary | ICD-10-CM | POA: Insufficient documentation

## 2021-06-07 DIAGNOSIS — R531 Weakness: Secondary | ICD-10-CM | POA: Diagnosis not present

## 2021-06-07 DIAGNOSIS — R296 Repeated falls: Secondary | ICD-10-CM | POA: Diagnosis not present

## 2021-06-07 DIAGNOSIS — M810 Age-related osteoporosis without current pathological fracture: Secondary | ICD-10-CM | POA: Diagnosis not present

## 2021-06-07 DIAGNOSIS — G600 Hereditary motor and sensory neuropathy: Secondary | ICD-10-CM | POA: Diagnosis not present

## 2021-06-07 DIAGNOSIS — S0512XA Contusion of eyeball and orbital tissues, left eye, initial encounter: Secondary | ICD-10-CM | POA: Diagnosis not present

## 2021-06-07 DIAGNOSIS — J9811 Atelectasis: Secondary | ICD-10-CM | POA: Diagnosis not present

## 2021-06-07 DIAGNOSIS — R0902 Hypoxemia: Secondary | ICD-10-CM | POA: Diagnosis not present

## 2021-06-07 DIAGNOSIS — I6381 Other cerebral infarction due to occlusion or stenosis of small artery: Secondary | ICD-10-CM | POA: Diagnosis not present

## 2021-06-07 DIAGNOSIS — Y900 Blood alcohol level of less than 20 mg/100 ml: Secondary | ICD-10-CM | POA: Diagnosis not present

## 2021-06-07 DIAGNOSIS — F419 Anxiety disorder, unspecified: Secondary | ICD-10-CM | POA: Diagnosis not present

## 2021-06-07 DIAGNOSIS — D630 Anemia in neoplastic disease: Secondary | ICD-10-CM | POA: Diagnosis not present

## 2021-06-07 DIAGNOSIS — R911 Solitary pulmonary nodule: Secondary | ICD-10-CM | POA: Diagnosis not present

## 2021-06-07 LAB — COMPREHENSIVE METABOLIC PANEL
ALT: 11 U/L (ref 0–44)
AST: 27 U/L (ref 15–41)
Albumin: 3.9 g/dL (ref 3.5–5.0)
Alkaline Phosphatase: 65 U/L (ref 38–126)
Anion gap: 11 (ref 5–15)
BUN: 21 mg/dL (ref 8–23)
CO2: 28 mmol/L (ref 22–32)
Calcium: 9.4 mg/dL (ref 8.9–10.3)
Chloride: 100 mmol/L (ref 98–111)
Creatinine, Ser: 1.38 mg/dL — ABNORMAL HIGH (ref 0.61–1.24)
GFR, Estimated: 48 mL/min — ABNORMAL LOW (ref >60)
Glucose, Bld: 107 mg/dL — ABNORMAL HIGH (ref 70–99)
Potassium: 3.3 mmol/L — ABNORMAL LOW (ref 3.5–5.1)
Sodium: 139 mmol/L (ref 135–145)
Total Bilirubin: 0.5 mg/dL (ref 0.3–1.2)
Total Protein: 7.6 g/dL (ref 6.5–8.1)

## 2021-06-07 LAB — CBC WITH DIFFERENTIAL/PLATELET
Abs Immature Granulocytes: 0.02 10*3/uL (ref 0.00–0.07)
Basophils Absolute: 0 10*3/uL (ref 0.0–0.1)
Basophils Relative: 0 %
Eosinophils Absolute: 0.1 10*3/uL (ref 0.0–0.5)
Eosinophils Relative: 2 %
HCT: 43.7 % (ref 39.0–52.0)
Hemoglobin: 13.8 g/dL (ref 13.0–17.0)
Immature Granulocytes: 0 %
Lymphocytes Relative: 19 %
Lymphs Abs: 1.1 10*3/uL (ref 0.7–4.0)
MCH: 27.6 pg (ref 26.0–34.0)
MCHC: 31.6 g/dL (ref 30.0–36.0)
MCV: 87.4 fL (ref 80.0–100.0)
Monocytes Absolute: 0.6 10*3/uL (ref 0.1–1.0)
Monocytes Relative: 9 %
Neutro Abs: 4.2 10*3/uL (ref 1.7–7.7)
Neutrophils Relative %: 70 %
Platelets: 196 10*3/uL (ref 150–400)
RBC: 5 MIL/uL (ref 4.22–5.81)
RDW: 14 % (ref 11.5–15.5)
WBC: 6.1 10*3/uL (ref 4.0–10.5)
nRBC: 0 % (ref 0.0–0.2)

## 2021-06-07 LAB — URINALYSIS, ROUTINE W REFLEX MICROSCOPIC
Bilirubin Urine: NEGATIVE
Glucose, UA: NEGATIVE mg/dL
Hgb urine dipstick: NEGATIVE
Ketones, ur: 5 mg/dL — AB
Leukocytes,Ua: NEGATIVE
Nitrite: NEGATIVE
Protein, ur: NEGATIVE mg/dL
Specific Gravity, Urine: 1.01 (ref 1.005–1.030)
pH: 6 (ref 5.0–8.0)

## 2021-06-07 LAB — ETHANOL: Alcohol, Ethyl (B): <10 mg/dL (ref <10)

## 2021-06-07 LAB — MAGNESIUM: Magnesium: 2.4 mg/dL (ref 1.7–2.4)

## 2021-06-07 LAB — CBG MONITORING, ED: Glucose-Capillary: 122 mg/dL — ABNORMAL HIGH (ref 70–99)

## 2021-06-07 MED ORDER — THIAMINE HCL 100 MG/ML IJ SOLN
100.0000 mg | Freq: Once | INTRAMUSCULAR | Status: AC
  Administered 2021-06-07: 100 mg via INTRAVENOUS
  Filled 2021-06-07: qty 2

## 2021-06-07 MED ORDER — SODIUM CHLORIDE 0.9 % IV BOLUS
500.0000 mL | Freq: Once | INTRAVENOUS | Status: AC
  Administered 2021-06-07: 500 mL via INTRAVENOUS

## 2021-06-07 NOTE — ED Notes (Signed)
Family at bedside and requesting the pt to be discharge. MD Ralene Bathe made aware of family request.

## 2021-06-07 NOTE — ED Triage Notes (Signed)
Pt BIB EMS. Pt daughter is c/o pt has had increased weakness and falls over the last 2 weeks. Pt recently had a fall yesterday at home with aide. Pt was seen here a week ago; pt daughter feels pt has did not get better after d/c and wants him evaluated for UTI.

## 2021-06-07 NOTE — ED Provider Notes (Signed)
[facility] DEPT Provider Note   CSN: [id number] Arrival date & time: 06/07/21  1557     History Chief Complaint  Patient presents with   Weakness    Casey Fisher is a 87 y.o. male.  The history is provided by the patient, the EMS personnel and medical records.  Weakness Casey Fisher is a 87 y.o. male who presents to the Emergency Department complaining of recurrent falls. Low five caveat due to confusion. History is provided by EMS. Per report he is brought in for increase weakness and falls over the last two weeks. He did have a fall yesterday at home with an aide. Patient denies any complaints on ED presentation. He states that he lives at home alone but people check on him daily. He states that he drinks liquor, is much as they bring him every day. He states he does get sick and feel poorly if he does not have alcohol. He states his last drink was earlier today. He is unsure about falls but denies any pain or injuries. No chest pain, abdominal pain, nausea, vomiting, diarrhea. He is unsure of his underlying medical problems.  Additional history available from patient's daughter after patient's initial ED presentation. Daughters concern for possible UTI or medical cause resulting in his frequent falls. She states that he does not drink liquor as he stated in his history.    Past Medical History:  Diagnosis Date   Abnormality of gait 03/24/2016   Anxiety    Cancer ([place])    Cataract    Depression    Heart murmur    Hereditary and idiopathic peripheral neuropathy 03/24/2016   Hypertension    Memory difficulty 03/24/2016   Neuromuscular disorder ([place])    Neuropathy    Parkinson's disease ([place]) 05/07/2021   Prostate cancer ([place])    remission    Patient Active Problem List   Diagnosis Date Noted   Parkinson's disease ([place]) 05/07/2021   Primary osteoarthritis of right knee 05/23/2020   Low back pain 08/03/2019   Acute pain of left  shoulder 10/10/2018   Chest pain 11/08/2017   Memory difficulty 03/24/2016   Hereditary and idiopathic peripheral neuropathy 03/24/2016   Abnormality of gait 03/24/2016   Peripheral neuropathy 04/18/2015   Hyperlipidemia 04/18/2015   Prostate cancer ([place]) 08/24/2014   Right inguinal hernia 10/30/2013    Past Surgical History:  Procedure Laterality Date   CATARACT EXTRACTION Bilateral    [place] Right 12/06/2013   Procedure: HERNIA REPAIR INGUINAL ADULT;  Surgeon: Merrie Roof, MD;  Location: [facility];  Service: General;  Laterality: Right;   INSERTION OF MESH Right 12/06/2013   Procedure: INSERTION OF MESH;  Surgeon: Merrie Roof, MD;  Location: [facility];  Service: General;  Laterality: Right;   PROSTATE SURGERY         Family History  Problem Relation Age of Onset   Brain cancer Daughter    Heart disease Mother    Stroke Mother    Heart disease Father     Social History   Tobacco Use   Smoking status: Former    Pack years: 0.00   Smokeless tobacco: Never  Vaping Use   Vaping Use: Never used  Substance Use Topics   Alcohol use: No   Drug use: No    Home Medications Prior to Admission medications   Medication Sig Start Date End  Date Taking? Authorizing Provider  ammonium lactate (AMLACTIN) 12 % lotion Apply to both feet twice daily for dry skin 05/25/19  Yes Galaway, Stephani Police, DPM  Calcium Carb-Cholecalciferol (CALCIUM 600 + D PO) Take 1 tablet by mouth daily.   Yes [provider]  carbidopa-levodopa (SINEMET) 25-250 MG tablet Take 1 tablet by mouth 3 (three) times daily. 05/07/21  Yes Kathrynn Ducking, MD  donepezil (ARICEPT) 5 MG tablet Take 1 tablet (5 mg total) by mouth at bedtime. 03/12/21  Yes Suzzanne Cloud, NP  DULoxetine (CYMBALTA) 30 MG capsule Take 1 capsule (30 mg total) by mouth 2 (two) times daily. 05/01/21  Yes Suzzanne Cloud, NP  ERLEADA 60 MG tablet Take 120 mg by mouth daily.  06/06/18   Yes [provider]  gabapentin (NEURONTIN) 600 MG tablet TAKE 2 TABLETS BY MOUTH IN THE MORNING AND TAKE 2 TABLETS BY MOUTH AT MIDDAY AND TAKE 1 TABLET IN THE EVENING 05/12/21  Yes [name], MD  hydrochlorothiazide (HYDRODIURIL) 12.5 MG tablet TAKE 1 TABLET(12.5 MG) BY MOUTH DAILY 05/12/21  Yes [name], MD  Leuprolide Acetate (LUPRON IJ) Inject 1 application as directed every 6 (six) months.   Yes [provider]  simvastatin (ZOCOR) 40 MG tablet TAKE 1 TABLET(40 MG) BY MOUTH DAILY 05/12/21  Yes [name], MD  tamsulosin (FLOMAX) 0.4 MG CAPS capsule Take 0.4 mg daily by mouth. Reported on 05/14/2016 08/17/15  Yes [provider]  aspirin EC 81 MG tablet Take 81 mg by mouth daily. Reported on 05/14/2016 Patient not taking: Reported on 06/07/2021    [provider]    Allergies    Lyrica [pregabalin]  Review of Systems   Review of Systems  Neurological:  Positive for weakness.  All other systems reviewed and are negative.  Physical Exam Updated Vital Signs BP (!) 162/83   Pulse 63   Temp 97.9 F (36.6 C) (Oral)   Resp 19   Ht 6\' 2"  (1.88 m)   Wt 75.3 kg   SpO2 96%   BMI 21.31 kg/m   Physical Exam Vitals and nursing note reviewed.  Constitutional:      Appearance: He is well-developed.  HENT:     Head: Normocephalic.     Comments: Small area of left infraorbital ecchymosis.  EOMI, PERRL Cardiovascular:     Rate and Rhythm: Normal rate and regular rhythm.     Heart sounds: No murmur heard. Pulmonary:     Effort: Pulmonary effort is normal. No respiratory distress.     Breath sounds: Normal breath sounds.  Abdominal:     Palpations: Abdomen is soft.     Tenderness: There is no abdominal tenderness. There is no guarding or rebound.  Musculoskeletal:        General: No swelling or tenderness.  Skin:    General: Skin is warm and dry.  Neurological:     Mental Status: He is alert.     Comments: Oriented to person.  Disoriented to place and time. Generalized weakness, greatest in the lower extremities.  Psychiatric:        Behavior: Behavior normal.    ED Results / Procedures / Treatments   Labs (all labs ordered are listed, but only abnormal results are displayed) Labs Reviewed  COMPREHENSIVE METABOLIC PANEL - Abnormal; Notable for the following components:      Result Value   Potassium 3.3 (*)    Glucose, Bld 107 (*)    Creatinine, Ser  1.38 (*)    GFR, Estimated 48 (*)    All other components within normal limits  URINALYSIS, ROUTINE W REFLEX MICROSCOPIC - Abnormal; Notable for the following components:   Ketones, ur 5 (*)    All other components within normal limits  CBG MONITORING, ED - Abnormal; Notable for the following components:   Glucose-Capillary 122 (*)    All other components within normal limits  URINE CULTURE  ETHANOL  CBC WITH DIFFERENTIAL/PLATELET  MAGNESIUM    EKG EKG Interpretation  Date/Time:  Saturday June 07 2021 17:46:13 EDT Ventricular Rate:  67 PR Interval:  151 QRS Duration: 132 QT Interval:  437 QTC Calculation: 462 R Axis:   -66 Text Interpretation: Sinus rhythm Atrial premature complex Nonspecific IVCD with LAD LVH with secondary repolarization abnormality Confirmed by Quintella Reichert [phone]) on 06/07/2021 6:55:41 PM  Radiology DG Chest 2 View  Result Date: 06/07/2021 CLINICAL DATA:  Increased weakness and falls over the past 2 weeks. EXAM: CHEST - 2 VIEW COMPARISON:  None. FINDINGS: Poor inspiration with a markedly decreased depth of inspiration. Associated bibasilar atelectasis and crowding of the lung markings. Normal sized heart. Thoracic spine degenerative changes. Left glenohumeral degenerative changes. IMPRESSION: Poor inspiration with associated bibasilar atelectasis. Electronically Signed   By: Claudie Revering M.D.   On: 06/07/2021 17:08   CT Head Wo Contrast  Result Date: 06/07/2021 CLINICAL DATA:  [place] yesterday. EXAM: CT HEAD WITHOUT CONTRAST CT  CERVICAL SPINE WITHOUT CONTRAST TECHNIQUE: Multidetector CT imaging of the head and cervical spine was performed following the standard protocol without intravenous contrast. Multiplanar CT image reconstructions of the cervical spine were also generated. COMPARISON:  05/31/2021 FINDINGS: CT HEAD FINDINGS Brain: Stable age related cerebral atrophy, ventriculomegaly and extensive periventricular white matter disease. Remote lacunar type right basal ganglia infarct. No extra-axial fluid collections are identified. No CT findings for acute hemispheric infarction or intracranial hemorrhage. No mass lesions. The brainstem and cerebellum are normal. Vascular: Stable vascular calcifications.  No hyperdense vessels. Skull: No skull fracture or bone lesions. Sinuses/Orbits: Paranasal sinuses and mastoid air cells are clear. The globes are intact. Other: No scalp lesions or scalp hematoma. CT CERVICAL SPINE FINDINGS Alignment: Normal Skull base and vertebrae: No acute fracture. No primary bone lesion or focal pathologic process. Soft tissues and spinal canal: No prevertebral fluid or swelling. No visible canal hematoma. Stable soft tissue calcifications in the posterior neck likely related to remote trauma. Disc levels: Degenerative cervical spondylosis with multilevel disc disease and facet disease but no significant canal stenosis. Upper chest: The lung apices are grossly clear. Other: No neck mass or adenopathy. IMPRESSION: 1. Stable age related cerebral atrophy, ventriculomegaly and extensive periventricular white matter disease. 2. Remote lacunar type right basal ganglia infarct. 3. No acute intracranial findings or skull fracture. 4. Normal alignment of the cervical vertebral bodies and no acute cervical spine fracture. 5. Degenerative cervical spondylosis with multilevel disc disease and facet disease but no significant canal stenosis. Electronically Signed   By: Marijo Sanes M.D.   On: 06/07/2021 17:19   CT Cervical  Spine Wo Contrast  Result Date: 06/07/2021 CLINICAL DATA:  [place] yesterday. EXAM: CT HEAD WITHOUT CONTRAST CT CERVICAL SPINE WITHOUT CONTRAST TECHNIQUE: Multidetector CT imaging of the head and cervical spine was performed following the standard protocol without intravenous contrast. Multiplanar CT image reconstructions of the cervical spine were also generated. COMPARISON:  05/31/2021 FINDINGS: CT HEAD FINDINGS Brain: Stable age related cerebral atrophy, ventriculomegaly and extensive periventricular white matter disease. Remote  lacunar type right basal ganglia infarct. No extra-axial fluid collections are identified. No CT findings for acute hemispheric infarction or intracranial hemorrhage. No mass lesions. The brainstem and cerebellum are normal. Vascular: Stable vascular calcifications.  No hyperdense vessels. Skull: No skull fracture or bone lesions. Sinuses/Orbits: Paranasal sinuses and mastoid air cells are clear. The globes are intact. Other: No scalp lesions or scalp hematoma. CT CERVICAL SPINE FINDINGS Alignment: Normal Skull base and vertebrae: No acute fracture. No primary bone lesion or focal pathologic process. Soft tissues and spinal canal: No prevertebral fluid or swelling. No visible canal hematoma. Stable soft tissue calcifications in the posterior neck likely related to remote trauma. Disc levels: Degenerative cervical spondylosis with multilevel disc disease and facet disease but no significant canal stenosis. Upper chest: The lung apices are grossly clear. Other: No neck mass or adenopathy. IMPRESSION: 1. Stable age related cerebral atrophy, ventriculomegaly and extensive periventricular white matter disease. 2. Remote lacunar type right basal ganglia infarct. 3. No acute intracranial findings or skull fracture. 4. Normal alignment of the cervical vertebral bodies and no acute cervical spine fracture. 5. Degenerative cervical spondylosis with multilevel disc disease and facet disease but no  significant canal stenosis. Electronically Signed   By: Marijo Sanes M.D.   On: 06/07/2021 17:19    Procedures Procedures   Medications Ordered in ED Medications  thiamine (B-1) injection 100 mg (100 mg Intravenous Given 06/07/21 1731)  sodium chloride 0.9 % bolus 500 mL (0 mLs Intravenous Stopped 06/07/21 1946)    ED Course  I have reviewed the triage vital signs and the nursing notes.  Pertinent labs & imaging results that were available during my care of the patient were reviewed by me and considered in my medical decision making (see chart for details).    MDM Rules/Calculators/A&P                         patient here for evaluation following frequent falls at home. Initial concern for possible chronic alcohol abuse and he was treated with Gilberto Better. On further history obtained from daughter she states that he is not a drinker. No evidence of serious intracranial injury, C-spine injury related to the falls. Chest x-ray without evidence of pneumonia. Labs are similar when compared to priors with mild elevation and creatinine. UA is not consistent with UTI. Given his frequent falls of his urine culture grows out infection would recommend treating this.  daughter has concerns that home health has not brought additional equipment to the home to assist with his needs at home such as wheelchair and bedside commode. Case management consult place for further assistance with this. Offered to keep patient in the department to discuss rehab options and have him see PT but patient is not interested in this and daughter request to take him home at this time. Plan to discharge home with outpatient resources and return precautions.  Final Clinical Impression(s) / ED Diagnoses Final diagnoses:  Frequent falls    Rx / DC Orders ED Discharge Orders     None        Quintella Reichert, MD 06/07/21 2229

## 2021-06-07 NOTE — ED Notes (Signed)
External cath applied, will collect urine specimen when patient voids.

## 2021-06-07 NOTE — ED Notes (Signed)
Patient unable to ambulate at this time due to weakness, RN aware.

## 2021-06-08 ENCOUNTER — Telehealth (HOSPITAL_COMMUNITY): Payer: Self-pay | Admitting: Emergency Medicine

## 2021-06-08 NOTE — Telephone Encounter (Signed)
Patient will need a hospital bed and hoyer lift at home to assist with comfort and mobility.

## 2021-06-08 NOTE — Care Management (Addendum)
Dr Ralene Bathe note about hospital bed and lift sent to [employer]  via fax  [phone] for processing, Bethanne Ginger aware.   1430 Received reply from [facility] will not pay for orders on progress notes, but he will forward to the community rep to help them get what they need.

## 2021-06-08 NOTE — Care Management (Signed)
Patient daughter stated he did not receive wheelchair or 3:1 called [employer], they looked at the order did not see any notes, but looked like it was not sent, did not know the reason. Will send it out today.

## 2021-06-09 ENCOUNTER — Telehealth: Payer: Self-pay | Admitting: Family Medicine

## 2021-06-09 LAB — URINE CULTURE: Culture: <10000 — AB

## 2021-06-09 NOTE — Telephone Encounter (Signed)
Caller said that patient was seen at [facility] this weekend but did not stay at the hospital to receive any of the test results.  Caller would like to be notified of all test results and xrays.  Please Advise

## 2021-06-09 NOTE — Telephone Encounter (Signed)
..  Home Health Verbal Orders  Agency:  [facility]  Caller:Genna Contact and title   Requesting OT/ PT/ Skilled nursing/ Social Work/ Speech:  PT  Reason for Request:  Improving strength, tranfer, standing, fall prevention, and gait training  Frequency:  2 x weekly for 2 weeks                     1 x weekly for 4 weeks   HH needs F2F w/in last 30 days

## 2021-06-10 DIAGNOSIS — F0280 Dementia in other diseases classified elsewhere without behavioral disturbance: Secondary | ICD-10-CM | POA: Diagnosis not present

## 2021-06-10 DIAGNOSIS — I10 Essential (primary) hypertension: Secondary | ICD-10-CM | POA: Diagnosis not present

## 2021-06-10 DIAGNOSIS — F419 Anxiety disorder, unspecified: Secondary | ICD-10-CM | POA: Diagnosis not present

## 2021-06-10 DIAGNOSIS — D630 Anemia in neoplastic disease: Secondary | ICD-10-CM | POA: Diagnosis not present

## 2021-06-10 DIAGNOSIS — C61 Malignant neoplasm of prostate: Secondary | ICD-10-CM | POA: Diagnosis not present

## 2021-06-10 DIAGNOSIS — G20 Parkinson's disease: Secondary | ICD-10-CM | POA: Diagnosis not present

## 2021-06-10 NOTE — Telephone Encounter (Signed)
It appears that the emergency department provider did evaluate his labs and imaging.  Urinalysis was not consistent with UTI.  Urine culture with insignificant growth, unlikely infection.  Chest x-ray without sign of pneumonia.  Labs similar to previous levels. On my review of labs, creatinine or kidney function test has gone up slightly.  Potassium mildly low.  Hemoglobin or blood count was normal.  Few abnormalities that may be age related on CT head, but nothing acute appearing. Please schedule virtual visit for follow-up within the next 1 week, sooner if needed.

## 2021-06-11 DIAGNOSIS — F419 Anxiety disorder, unspecified: Secondary | ICD-10-CM | POA: Diagnosis not present

## 2021-06-11 DIAGNOSIS — D630 Anemia in neoplastic disease: Secondary | ICD-10-CM | POA: Diagnosis not present

## 2021-06-11 DIAGNOSIS — G20 Parkinson's disease: Secondary | ICD-10-CM | POA: Diagnosis not present

## 2021-06-11 DIAGNOSIS — C61 Malignant neoplasm of prostate: Secondary | ICD-10-CM | POA: Diagnosis not present

## 2021-06-11 DIAGNOSIS — I10 Essential (primary) hypertension: Secondary | ICD-10-CM | POA: Diagnosis not present

## 2021-06-11 DIAGNOSIS — F0280 Dementia in other diseases classified elsewhere without behavioral disturbance: Secondary | ICD-10-CM | POA: Diagnosis not present

## 2021-06-12 DIAGNOSIS — I10 Essential (primary) hypertension: Secondary | ICD-10-CM | POA: Diagnosis not present

## 2021-06-12 DIAGNOSIS — G20 Parkinson's disease: Secondary | ICD-10-CM | POA: Diagnosis not present

## 2021-06-12 DIAGNOSIS — F419 Anxiety disorder, unspecified: Secondary | ICD-10-CM | POA: Diagnosis not present

## 2021-06-12 DIAGNOSIS — C61 Malignant neoplasm of prostate: Secondary | ICD-10-CM | POA: Diagnosis not present

## 2021-06-12 DIAGNOSIS — D630 Anemia in neoplastic disease: Secondary | ICD-10-CM | POA: Diagnosis not present

## 2021-06-12 DIAGNOSIS — F0280 Dementia in other diseases classified elsewhere without behavioral disturbance: Secondary | ICD-10-CM | POA: Diagnosis not present

## 2021-06-12 NOTE — Telephone Encounter (Signed)
Called pt no answer LM asking he make a virtual visit for follow up on multiple issues sometime in the next week.

## 2021-06-13 DIAGNOSIS — C61 Malignant neoplasm of prostate: Secondary | ICD-10-CM | POA: Diagnosis not present

## 2021-06-13 DIAGNOSIS — F419 Anxiety disorder, unspecified: Secondary | ICD-10-CM | POA: Diagnosis not present

## 2021-06-13 DIAGNOSIS — D630 Anemia in neoplastic disease: Secondary | ICD-10-CM | POA: Diagnosis not present

## 2021-06-13 DIAGNOSIS — G20 Parkinson's disease: Secondary | ICD-10-CM | POA: Diagnosis not present

## 2021-06-13 DIAGNOSIS — I10 Essential (primary) hypertension: Secondary | ICD-10-CM | POA: Diagnosis not present

## 2021-06-13 DIAGNOSIS — F0280 Dementia in other diseases classified elsewhere without behavioral disturbance: Secondary | ICD-10-CM | POA: Diagnosis not present

## 2021-06-13 NOTE — Telephone Encounter (Signed)
I am ok with these verbal orders. Let me know if orders need to be signed.  -JG

## 2021-06-13 NOTE — Telephone Encounter (Signed)
Verbal orders given  

## 2021-06-17 DIAGNOSIS — F419 Anxiety disorder, unspecified: Secondary | ICD-10-CM | POA: Diagnosis not present

## 2021-06-17 DIAGNOSIS — C61 Malignant neoplasm of prostate: Secondary | ICD-10-CM | POA: Diagnosis not present

## 2021-06-17 DIAGNOSIS — D630 Anemia in neoplastic disease: Secondary | ICD-10-CM | POA: Diagnosis not present

## 2021-06-17 DIAGNOSIS — I10 Essential (primary) hypertension: Secondary | ICD-10-CM | POA: Diagnosis not present

## 2021-06-17 DIAGNOSIS — G20 Parkinson's disease: Secondary | ICD-10-CM | POA: Diagnosis not present

## 2021-06-17 DIAGNOSIS — F0280 Dementia in other diseases classified elsewhere without behavioral disturbance: Secondary | ICD-10-CM | POA: Diagnosis not present

## 2021-06-18 DIAGNOSIS — I10 Essential (primary) hypertension: Secondary | ICD-10-CM | POA: Diagnosis not present

## 2021-06-18 DIAGNOSIS — G20 Parkinson's disease: Secondary | ICD-10-CM | POA: Diagnosis not present

## 2021-06-18 DIAGNOSIS — F419 Anxiety disorder, unspecified: Secondary | ICD-10-CM | POA: Diagnosis not present

## 2021-06-18 DIAGNOSIS — F0280 Dementia in other diseases classified elsewhere without behavioral disturbance: Secondary | ICD-10-CM | POA: Diagnosis not present

## 2021-06-18 DIAGNOSIS — C61 Malignant neoplasm of prostate: Secondary | ICD-10-CM | POA: Diagnosis not present

## 2021-06-18 DIAGNOSIS — D630 Anemia in neoplastic disease: Secondary | ICD-10-CM | POA: Diagnosis not present

## 2021-06-19 ENCOUNTER — Ambulatory Visit: Payer: Self-pay | Admitting: *Deleted

## 2021-06-19 DIAGNOSIS — F0280 Dementia in other diseases classified elsewhere without behavioral disturbance: Secondary | ICD-10-CM | POA: Diagnosis not present

## 2021-06-19 DIAGNOSIS — C61 Malignant neoplasm of prostate: Secondary | ICD-10-CM | POA: Diagnosis not present

## 2021-06-19 DIAGNOSIS — I10 Essential (primary) hypertension: Secondary | ICD-10-CM | POA: Diagnosis not present

## 2021-06-19 DIAGNOSIS — F419 Anxiety disorder, unspecified: Secondary | ICD-10-CM | POA: Diagnosis not present

## 2021-06-19 DIAGNOSIS — D630 Anemia in neoplastic disease: Secondary | ICD-10-CM | POA: Diagnosis not present

## 2021-06-19 DIAGNOSIS — G20 Parkinson's disease: Secondary | ICD-10-CM | POA: Diagnosis not present

## 2021-06-20 DIAGNOSIS — G20 Parkinson's disease: Secondary | ICD-10-CM | POA: Diagnosis not present

## 2021-06-20 DIAGNOSIS — F0280 Dementia in other diseases classified elsewhere without behavioral disturbance: Secondary | ICD-10-CM | POA: Diagnosis not present

## 2021-06-20 DIAGNOSIS — C61 Malignant neoplasm of prostate: Secondary | ICD-10-CM | POA: Diagnosis not present

## 2021-06-20 DIAGNOSIS — I10 Essential (primary) hypertension: Secondary | ICD-10-CM | POA: Diagnosis not present

## 2021-06-20 DIAGNOSIS — F419 Anxiety disorder, unspecified: Secondary | ICD-10-CM | POA: Diagnosis not present

## 2021-06-20 DIAGNOSIS — D630 Anemia in neoplastic disease: Secondary | ICD-10-CM | POA: Diagnosis not present

## 2021-06-24 ENCOUNTER — Telehealth: Payer: Self-pay | Admitting: Family Medicine

## 2021-06-24 ENCOUNTER — Other Ambulatory Visit: Payer: Self-pay | Admitting: *Deleted

## 2021-06-24 DIAGNOSIS — F419 Anxiety disorder, unspecified: Secondary | ICD-10-CM | POA: Diagnosis not present

## 2021-06-24 DIAGNOSIS — F0280 Dementia in other diseases classified elsewhere without behavioral disturbance: Secondary | ICD-10-CM | POA: Diagnosis not present

## 2021-06-24 DIAGNOSIS — D630 Anemia in neoplastic disease: Secondary | ICD-10-CM | POA: Diagnosis not present

## 2021-06-24 DIAGNOSIS — I10 Essential (primary) hypertension: Secondary | ICD-10-CM | POA: Diagnosis not present

## 2021-06-24 DIAGNOSIS — G20 Parkinson's disease: Secondary | ICD-10-CM | POA: Diagnosis not present

## 2021-06-24 DIAGNOSIS — C61 Malignant neoplasm of prostate: Secondary | ICD-10-CM | POA: Diagnosis not present

## 2021-06-24 NOTE — Telephone Encounter (Signed)
..  Type of form received:FMLA for patient's daughter  Additional comments:   Received XH:FSFSE  Form should be Faxed to:[phone]  Form should be mailed to:    Is patient requesting call for pickup:   Form placed:  In Dr. Vonna Kotyk bin up front  Attach charge sheet.  Provider will determine charge.  Individual made aware of 3-5 business day turn around (Y/N)?

## 2021-06-24 NOTE — Patient Outreach (Addendum)
[facility]) Care Management  06/24/2021  Casey Fisher 02/09/1928 [id number]   RN Health Coach telephone call from  patient daughter [name].  [employer] compliance verified. [name] would like a list of facilities and the rates locally for placement for her father. He is having multiple falls. He has some dementia and  he lives alone at this time.    Plan: referred to [place] Management [phone]

## 2021-06-25 ENCOUNTER — Telehealth: Payer: Self-pay | Admitting: Family Medicine

## 2021-06-25 DIAGNOSIS — F0280 Dementia in other diseases classified elsewhere without behavioral disturbance: Secondary | ICD-10-CM | POA: Diagnosis not present

## 2021-06-25 DIAGNOSIS — D630 Anemia in neoplastic disease: Secondary | ICD-10-CM | POA: Diagnosis not present

## 2021-06-25 DIAGNOSIS — C61 Malignant neoplasm of prostate: Secondary | ICD-10-CM | POA: Diagnosis not present

## 2021-06-25 DIAGNOSIS — G20 Parkinson's disease: Secondary | ICD-10-CM | POA: Diagnosis not present

## 2021-06-25 DIAGNOSIS — I10 Essential (primary) hypertension: Secondary | ICD-10-CM | POA: Diagnosis not present

## 2021-06-25 DIAGNOSIS — F419 Anxiety disorder, unspecified: Secondary | ICD-10-CM | POA: Diagnosis not present

## 2021-06-25 NOTE — Telephone Encounter (Signed)
   Telephone encounter was:  Successful.  06/25/2021 Name: Casey Fisher MRN: [id number] DOB: 23-Jun-1928  [name] is a 87 y.o. year old male who is a primary care patient of Carlota Raspberry, Ranell Patrick, MD . The community resource team was consulted for assistance with  list of assisted living facilities in [employer]  Care guide performed the following interventions: Patient provided with information about care guide support team and interviewed to confirm resource needs Discussed resources to assist with assisted living facilities in [employer] Emailed pts daughter [name] the area on aging pdf that we have and pt confirmed that she got the email while on the phone with me no other needs at this time .  Follow Up Plan:  No further follow up planned at this time. The patient has been provided with needed resources.  [place], Care Management Phone: [phone] Email: [email or web address]

## 2021-06-26 ENCOUNTER — Ambulatory Visit: Payer: Self-pay | Admitting: *Deleted

## 2021-06-26 DIAGNOSIS — C61 Malignant neoplasm of prostate: Secondary | ICD-10-CM | POA: Diagnosis not present

## 2021-06-26 DIAGNOSIS — D630 Anemia in neoplastic disease: Secondary | ICD-10-CM | POA: Diagnosis not present

## 2021-06-26 DIAGNOSIS — I10 Essential (primary) hypertension: Secondary | ICD-10-CM | POA: Diagnosis not present

## 2021-06-26 DIAGNOSIS — G20 Parkinson's disease: Secondary | ICD-10-CM | POA: Diagnosis not present

## 2021-06-26 DIAGNOSIS — F0280 Dementia in other diseases classified elsewhere without behavioral disturbance: Secondary | ICD-10-CM | POA: Diagnosis not present

## 2021-06-26 DIAGNOSIS — F419 Anxiety disorder, unspecified: Secondary | ICD-10-CM | POA: Diagnosis not present

## 2021-06-27 DIAGNOSIS — 419620001 Death: Secondary | SNOMED CT | POA: Diagnosis not present

## 2021-06-27 DEATH — deceased

## 2021-07-01 ENCOUNTER — Telehealth: Payer: Self-pay | Admitting: Family Medicine

## 2021-07-01 DIAGNOSIS — I10 Essential (primary) hypertension: Secondary | ICD-10-CM | POA: Diagnosis not present

## 2021-07-01 DIAGNOSIS — F0280 Dementia in other diseases classified elsewhere without behavioral disturbance: Secondary | ICD-10-CM | POA: Diagnosis not present

## 2021-07-01 DIAGNOSIS — G20 Parkinson's disease: Secondary | ICD-10-CM | POA: Diagnosis not present

## 2021-07-01 DIAGNOSIS — C61 Malignant neoplasm of prostate: Secondary | ICD-10-CM | POA: Diagnosis not present

## 2021-07-01 DIAGNOSIS — D630 Anemia in neoplastic disease: Secondary | ICD-10-CM | POA: Diagnosis not present

## 2021-07-01 DIAGNOSIS — F419 Anxiety disorder, unspecified: Secondary | ICD-10-CM | POA: Diagnosis not present

## 2021-07-01 NOTE — Telephone Encounter (Signed)
Home health nurse is reporting that patient had a fall this morning per his family members.  Patient is not complaining of any pain.

## 2021-07-03 DIAGNOSIS — D630 Anemia in neoplastic disease: Secondary | ICD-10-CM | POA: Diagnosis not present

## 2021-07-03 DIAGNOSIS — F0280 Dementia in other diseases classified elsewhere without behavioral disturbance: Secondary | ICD-10-CM | POA: Diagnosis not present

## 2021-07-03 DIAGNOSIS — I10 Essential (primary) hypertension: Secondary | ICD-10-CM | POA: Diagnosis not present

## 2021-07-03 DIAGNOSIS — C61 Malignant neoplasm of prostate: Secondary | ICD-10-CM | POA: Diagnosis not present

## 2021-07-03 DIAGNOSIS — F419 Anxiety disorder, unspecified: Secondary | ICD-10-CM | POA: Diagnosis not present

## 2021-07-03 DIAGNOSIS — G20 Parkinson's disease: Secondary | ICD-10-CM | POA: Diagnosis not present

## 2021-07-03 NOTE — Telephone Encounter (Signed)
Noted. Please advise to be seen if any new pain or apparent injury.

## 2021-07-04 NOTE — Telephone Encounter (Signed)
Called patient but was unavailable. Left a message with family member regarding provider recommendations.

## 2021-07-07 DIAGNOSIS — D630 Anemia in neoplastic disease: Secondary | ICD-10-CM | POA: Diagnosis not present

## 2021-07-07 DIAGNOSIS — M1711 Unilateral primary osteoarthritis, right knee: Secondary | ICD-10-CM | POA: Diagnosis not present

## 2021-07-07 DIAGNOSIS — G20 Parkinson's disease: Secondary | ICD-10-CM | POA: Diagnosis not present

## 2021-07-07 DIAGNOSIS — E785 Hyperlipidemia, unspecified: Secondary | ICD-10-CM | POA: Diagnosis not present

## 2021-07-07 DIAGNOSIS — F0280 Dementia in other diseases classified elsewhere without behavioral disturbance: Secondary | ICD-10-CM | POA: Diagnosis not present

## 2021-07-07 DIAGNOSIS — I10 Essential (primary) hypertension: Secondary | ICD-10-CM | POA: Diagnosis not present

## 2021-07-07 DIAGNOSIS — Z87891 Personal history of nicotine dependence: Secondary | ICD-10-CM | POA: Diagnosis not present

## 2021-07-07 DIAGNOSIS — M810 Age-related osteoporosis without current pathological fracture: Secondary | ICD-10-CM | POA: Diagnosis not present

## 2021-07-07 DIAGNOSIS — C61 Malignant neoplasm of prostate: Secondary | ICD-10-CM | POA: Diagnosis not present

## 2021-07-07 DIAGNOSIS — Z7982 Long term (current) use of aspirin: Secondary | ICD-10-CM | POA: Diagnosis not present

## 2021-07-07 DIAGNOSIS — F32A Depression, unspecified: Secondary | ICD-10-CM | POA: Diagnosis not present

## 2021-07-07 DIAGNOSIS — M5030 Other cervical disc degeneration, unspecified cervical region: Secondary | ICD-10-CM | POA: Diagnosis not present

## 2021-07-07 DIAGNOSIS — Z9181 History of falling: Secondary | ICD-10-CM | POA: Diagnosis not present

## 2021-07-07 DIAGNOSIS — R911 Solitary pulmonary nodule: Secondary | ICD-10-CM | POA: Diagnosis not present

## 2021-07-07 DIAGNOSIS — Z79899 Other long term (current) drug therapy: Secondary | ICD-10-CM | POA: Diagnosis not present

## 2021-07-07 DIAGNOSIS — G600 Hereditary motor and sensory neuropathy: Secondary | ICD-10-CM | POA: Diagnosis not present

## 2021-07-07 DIAGNOSIS — F419 Anxiety disorder, unspecified: Secondary | ICD-10-CM | POA: Diagnosis not present

## 2021-07-07 NOTE — Telephone Encounter (Signed)
Called patient's daughter [name] to discuss timing and needs for [facility] paperwork.  She will require up to 3 days/week out of work to travel and help with his care which may include assisting with bathing, dressing, meal prep, medication dispensing, assisting with transfers.  I will complete FMLA paperwork and should be ready to fax in the next day or 2..  Also discussed questions regarding his diagnosis of parkinsonism including reviewing neurology notes dating back to February of last year when he started on Sinemet, along with history of gait disorder and peripheral neuropathy with memory issues, CT of brain with extensive small vessel disease.  Most recent note from neurology May 11 with trial of increased dose Sinemet to see if any improvement in gait.  All questions were answered.

## 2021-07-08 ENCOUNTER — Telehealth: Payer: Self-pay

## 2021-07-08 ENCOUNTER — Ambulatory Visit: Payer: Medicare Other | Admitting: Podiatry

## 2021-07-08 DIAGNOSIS — G20 Parkinson's disease: Secondary | ICD-10-CM | POA: Diagnosis not present

## 2021-07-08 DIAGNOSIS — F419 Anxiety disorder, unspecified: Secondary | ICD-10-CM | POA: Diagnosis not present

## 2021-07-08 DIAGNOSIS — C61 Malignant neoplasm of prostate: Secondary | ICD-10-CM | POA: Diagnosis not present

## 2021-07-08 DIAGNOSIS — I10 Essential (primary) hypertension: Secondary | ICD-10-CM | POA: Diagnosis not present

## 2021-07-08 DIAGNOSIS — D630 Anemia in neoplastic disease: Secondary | ICD-10-CM | POA: Diagnosis not present

## 2021-07-08 DIAGNOSIS — F0280 Dementia in other diseases classified elsewhere without behavioral disturbance: Secondary | ICD-10-CM | POA: Diagnosis not present

## 2021-07-08 NOTE — Telephone Encounter (Signed)
Dionka from [facility] is calling to report Pt had fell out of his bed this morning no pain skin tare to left knee this was reported by his grandson.   Call back [phone] Dionka

## 2021-07-09 DIAGNOSIS — Z0279 Encounter for issue of other medical certificate: Secondary | ICD-10-CM

## 2021-07-10 DIAGNOSIS — F419 Anxiety disorder, unspecified: Secondary | ICD-10-CM | POA: Diagnosis not present

## 2021-07-10 DIAGNOSIS — I10 Essential (primary) hypertension: Secondary | ICD-10-CM | POA: Diagnosis not present

## 2021-07-10 DIAGNOSIS — F0280 Dementia in other diseases classified elsewhere without behavioral disturbance: Secondary | ICD-10-CM | POA: Diagnosis not present

## 2021-07-10 DIAGNOSIS — D630 Anemia in neoplastic disease: Secondary | ICD-10-CM | POA: Diagnosis not present

## 2021-07-10 DIAGNOSIS — C61 Malignant neoplasm of prostate: Secondary | ICD-10-CM | POA: Diagnosis not present

## 2021-07-10 DIAGNOSIS — G20 Parkinson's disease: Secondary | ICD-10-CM | POA: Diagnosis not present

## 2021-07-11 ENCOUNTER — Telehealth: Payer: Self-pay | Admitting: Family Medicine

## 2021-07-11 NOTE — Telephone Encounter (Signed)
..  Home Health Certification or Plan of Care Tracking  Is this a Certification or Plan of Care? Yes  [place] Agency:  [facility]  Order Number:  [id number]  Has charge sheet been attached? yes  Where has form been placed:   In Dr. Vonna Kotyk bin up front

## 2021-07-14 ENCOUNTER — Ambulatory Visit: Payer: Medicare Other | Admitting: Family Medicine

## 2021-07-14 NOTE — Telephone Encounter (Signed)
Called and spoke with patient's grandson who states patient is in his room resting. He states patient is doing fine, he was doing more than he should have when he had his fall.

## 2021-07-14 NOTE — Telephone Encounter (Signed)
Call patient, check status.  He was due for a follow-up visit with me this afternoon but unfortunately no-show.

## 2021-07-15 DIAGNOSIS — F419 Anxiety disorder, unspecified: Secondary | ICD-10-CM | POA: Diagnosis not present

## 2021-07-15 DIAGNOSIS — C61 Malignant neoplasm of prostate: Secondary | ICD-10-CM | POA: Diagnosis not present

## 2021-07-15 DIAGNOSIS — G20 Parkinson's disease: Secondary | ICD-10-CM | POA: Diagnosis not present

## 2021-07-15 DIAGNOSIS — D630 Anemia in neoplastic disease: Secondary | ICD-10-CM | POA: Diagnosis not present

## 2021-07-15 DIAGNOSIS — F0280 Dementia in other diseases classified elsewhere without behavioral disturbance: Secondary | ICD-10-CM | POA: Diagnosis not present

## 2021-07-15 DIAGNOSIS — I10 Essential (primary) hypertension: Secondary | ICD-10-CM | POA: Diagnosis not present

## 2021-07-17 DIAGNOSIS — G20 Parkinson's disease: Secondary | ICD-10-CM | POA: Diagnosis not present

## 2021-07-17 DIAGNOSIS — F419 Anxiety disorder, unspecified: Secondary | ICD-10-CM | POA: Diagnosis not present

## 2021-07-17 DIAGNOSIS — F0280 Dementia in other diseases classified elsewhere without behavioral disturbance: Secondary | ICD-10-CM | POA: Diagnosis not present

## 2021-07-17 DIAGNOSIS — D630 Anemia in neoplastic disease: Secondary | ICD-10-CM | POA: Diagnosis not present

## 2021-07-17 DIAGNOSIS — C61 Malignant neoplasm of prostate: Secondary | ICD-10-CM | POA: Diagnosis not present

## 2021-07-17 DIAGNOSIS — I10 Essential (primary) hypertension: Secondary | ICD-10-CM | POA: Diagnosis not present

## 2021-07-22 ENCOUNTER — Telehealth: Payer: Self-pay

## 2021-07-22 NOTE — Telephone Encounter (Signed)
Called patient's daughter back with provider recommendations. She voiced understanding. She is also aware if things don't improve, patient will need to be seen.

## 2021-07-22 NOTE — Telephone Encounter (Signed)
Called to get more information. Daughter states she was given the information through home health and her nephew. Patient is not eating much, per patients daughter, "He takes a ton of medication in the morning." Patient has been throwing up but daughter suspects it is all the medications he is taking on a near empty stomach.  Episodes have been occurring after his morning medications are given. Daughter states patient routinely eats the same thing, so maybe he is tired of eating the same food. Patient denies any abdominal pain and is having regular bowel movements daily. Daughter would like some suggestions on what to do to help.

## 2021-07-22 NOTE — Telephone Encounter (Signed)
Daughter is calling for suggestion father stomach is been bothering him and not eating much. Pt is having bowl movements but daughter thinks maybe his meds sick on his stomach.  Should she try a liquid diet or brat diet   Pt call back [phone]

## 2021-07-22 NOTE — Telephone Encounter (Signed)
If this is a new problem it would warrant an in-person assessment in my opinion - if they want to try food adjustment I think that is fine. Can delay morning gabapentin dose to after breakfast, take statin with dinner, aricept at night, can switch tamsulosin to night if they'd desire but close monitoring of effect. Check weights daily to ensure he's not losing or gaining weight rapidly. A change of 5lbs in 5 days would warrant concern. Can also continue to consider skilled nursing facility though I know this is not in their current interest.   Thank you  Rich

## 2021-07-23 ENCOUNTER — Telehealth: Payer: Self-pay | Admitting: Family Medicine

## 2021-07-23 ENCOUNTER — Telehealth: Payer: Self-pay

## 2021-07-23 ENCOUNTER — Other Ambulatory Visit: Payer: Self-pay | Admitting: *Deleted

## 2021-07-23 DIAGNOSIS — I10 Essential (primary) hypertension: Secondary | ICD-10-CM | POA: Diagnosis not present

## 2021-07-23 DIAGNOSIS — F0280 Dementia in other diseases classified elsewhere without behavioral disturbance: Secondary | ICD-10-CM | POA: Diagnosis not present

## 2021-07-23 DIAGNOSIS — F419 Anxiety disorder, unspecified: Secondary | ICD-10-CM | POA: Diagnosis not present

## 2021-07-23 DIAGNOSIS — G20 Parkinson's disease: Secondary | ICD-10-CM | POA: Diagnosis not present

## 2021-07-23 DIAGNOSIS — D630 Anemia in neoplastic disease: Secondary | ICD-10-CM | POA: Diagnosis not present

## 2021-07-23 DIAGNOSIS — C61 Malignant neoplasm of prostate: Secondary | ICD-10-CM | POA: Diagnosis not present

## 2021-07-23 NOTE — Telephone Encounter (Signed)
..  Home Health Verbal Orders  Agency:  [facility]  Caller:[name] Contact and title   Requesting OT/ PT/ Skilled nursing/ Social Work/ Speech:    Needs Social Work visit for the 07/24/2021.  This was rescheduled from the 22nd  Reason for Request:    Frequency:     HH needs F2F w/in last 30 days

## 2021-07-23 NOTE — Telephone Encounter (Signed)
Spoke with patient's grandson Lennette Bihari and scheduled an in-person Palliative Consult for 08/27/21 @ 12:30PM.   COVID screening was negative. No pets in home.   Consent obtained; updated Outlook/Netsmart/Team List and Epic.   Family is aware they may be receiving a call from provider the day before or day of to confirm appointment.

## 2021-07-23 NOTE — Patient Outreach (Signed)
[facility]) Care Management  07/23/2021  Casey Fisher 1928-05-17 [id number]   RN Health Coach attempted follow up outreach call to patient.  Patient caregiver [name] stated not a good time and asked if he could return call tomorrow.    Plan: Patient caregiver will return call back on [phone]  Jaquin Coy BSN RN [facility] Care Management [phone]

## 2021-07-24 ENCOUNTER — Telehealth: Payer: Self-pay

## 2021-07-24 NOTE — Telephone Encounter (Signed)
Error

## 2021-07-24 NOTE — Telephone Encounter (Signed)
Casey Fisher is calling [name] PT is calling wanting to request that the [facility] nurse evaluate the patient. Patient is showing mental and physical decline   Call back [phone]

## 2021-07-25 ENCOUNTER — Telehealth: Payer: Self-pay

## 2021-07-25 DIAGNOSIS — D630 Anemia in neoplastic disease: Secondary | ICD-10-CM | POA: Diagnosis not present

## 2021-07-25 DIAGNOSIS — F0280 Dementia in other diseases classified elsewhere without behavioral disturbance: Secondary | ICD-10-CM | POA: Diagnosis not present

## 2021-07-25 DIAGNOSIS — F419 Anxiety disorder, unspecified: Secondary | ICD-10-CM | POA: Diagnosis not present

## 2021-07-25 DIAGNOSIS — I10 Essential (primary) hypertension: Secondary | ICD-10-CM | POA: Diagnosis not present

## 2021-07-25 DIAGNOSIS — C61 Malignant neoplasm of prostate: Secondary | ICD-10-CM | POA: Diagnosis not present

## 2021-07-25 DIAGNOSIS — G20 Parkinson's disease: Secondary | ICD-10-CM | POA: Diagnosis not present

## 2021-07-25 NOTE — Telephone Encounter (Signed)
Left a voicemail message for [name] with provider recommendations.

## 2021-07-25 NOTE — Telephone Encounter (Signed)
Called and left a voicemail message to see if they are just needing a verbal order.

## 2021-07-25 NOTE — Telephone Encounter (Signed)
Home health states that they do not need a F2F - all they need is a verbal order switching his appointment with social work from last week to this week.

## 2021-07-25 NOTE — Telephone Encounter (Signed)
Yes, ok for that change by verbal order. Thanks.

## 2021-07-25 NOTE — Telephone Encounter (Signed)
Please check with patient/family on his status and please provide further details of what has changed.  May need to schedule visit in next few days, but agree with home nurse eval as well to update. Please try to obtain a few more details as well.

## 2021-07-25 NOTE — Telephone Encounter (Signed)
Spoke with [name] SW with [facility]. Inquired if referral for PC was received and if visit scheduled. Provided update. Concerns forwarded to Primary Palliative care team

## 2021-07-25 NOTE — Telephone Encounter (Signed)
Christine with [facility] returning [facility] call

## 2021-07-28 ENCOUNTER — Telehealth: Payer: Self-pay | Admitting: Family Medicine

## 2021-07-28 NOTE — Telephone Encounter (Signed)
..  Home Health Certification or Plan of Care Tracking  Is this a Certification or Plan of Care?  Yes  [place] Agency:[facility]  Order Number:  [id number]  Has charge sheet been attached? yes  Where has form been placed:   In Dr. Vonna Kotyk bin up front

## 2021-07-29 ENCOUNTER — Telehealth: Payer: Self-pay | Admitting: Family Medicine

## 2021-07-29 ENCOUNTER — Other Ambulatory Visit: Payer: Self-pay | Admitting: *Deleted

## 2021-07-29 NOTE — Telephone Encounter (Signed)
..  Home Health Certification or Plan of Care Tracking  Is this a Certification or Plan of Care?Yes  [place] Agency:   [facility]  Order Number:  [id number]  Has charge sheet been attached? yes  Where has form been placed:   Placed in Dr. Vonna Kotyk bin up front

## 2021-07-29 NOTE — Patient Outreach (Signed)
[facility]) Care Management  Casey Fisher  07/29/2021   Casey Fisher 1928-01-19 [id number]  [place] received telephone call from caregiver [name].  [employer] compliance verified. Per [name] the patient blood pressures have been ranging from 119/64-194/93 Hr 61-83. Patient appetite has decreased. Per caregiver he came in one evening and found the patient on the floor. The evening CNA did not come. Per [name] there were no injuries. [name] is changing his rounds to evening to make sure patient has evening meals and medications and in bed. Per [name] the patient can hardly walk. He missed his last Dr appointment due to difficulty walking. He stated the daughter has talked about assistant living. Patient caregiver [name] has agreed to follow up outreach calls.  Encounter Medications:  Outpatient Encounter Medications as of 07/29/2021  Medication Sig Note   ammonium lactate (AMLACTIN) 12 % lotion Apply to both feet twice daily for dry skin    aspirin EC 81 MG tablet Take 81 mg by mouth daily. Reported on 05/14/2016 (Patient not taking: Reported on 06/07/2021)    Calcium Carb-Cholecalciferol (CALCIUM 600 + D PO) Take 1 tablet by mouth daily.    carbidopa-levodopa (SINEMET) 25-250 MG tablet Take 1 tablet by mouth 3 (three) times daily. 06/07/2021: 2 doses as of 06/11   donepezil (ARICEPT) 5 MG tablet Take 1 tablet (5 mg total) by mouth at bedtime.    DULoxetine (CYMBALTA) 30 MG capsule Take 1 capsule (30 mg total) by mouth 2 (two) times daily. 06/07/2021: 1 dose as of 06/11   ERLEADA 60 MG tablet Take 120 mg by mouth daily.     gabapentin (NEURONTIN) 600 MG tablet TAKE 2 TABLETS BY MOUTH IN THE MORNING AND TAKE 2 TABLETS BY MOUTH AT MIDDAY AND TAKE 1 TABLET IN THE EVENING 06/07/2021: 1 dose as of 06/11   hydrochlorothiazide (HYDRODIURIL) 12.5 MG tablet TAKE 1 TABLET(12.5 MG) BY MOUTH DAILY    Leuprolide Acetate (LUPRON IJ)  Inject 1 application as directed every 6 (six) months. 06/07/2021: Due Injection / 6/08 - Rescheduled per daughter    simvastatin (ZOCOR) 40 MG tablet TAKE 1 TABLET(40 MG) BY MOUTH DAILY    tamsulosin (FLOMAX) 0.4 MG CAPS capsule Take 0.4 mg daily by mouth. Reported on 05/14/2016    No facility-administered encounter medications on file as of 07/29/2021.    Functional Status:  In your present state of health, do you have any difficulty performing the following activities: 12/09/2020  Hearing? N  Vision? N  Difficulty concentrating or making decisions? Y  Walking or climbing stairs? Y  Dressing or bathing? N  Doing errands, shopping? Y  Preparing Food and eating ? Y  Using the Toilet? N  In the past six months, have you accidently leaked urine? N  Do you have problems with loss of bowel control? N  Managing your Medications? Y  Managing your Finances? Y  Housekeeping or managing your Housekeeping? Y  Some recent data might be hidden    Fall/Depression Screening: Fall Risk  07/29/2021 06/05/2021 05/12/2021  Falls in the past year? 1 1 0  Comment - - -  Number falls in past yr: - 1 -  Comment - - -  Injury with Fall? 1 0 -  Risk Factor Category  - - -  Risk for fall due to : History of fall(s);Impaired balance/gait;Impaired mobility;Mental status change History of fall(s);Impaired mobility -  Follow up Falls evaluation completed Falls evaluation completed  Falls prevention discussed   PHQ 2/9 Scores 05/12/2021 11/11/2020 01/25/2020 01/19/2020 12/06/2019 10/31/2019 10/05/2019  PHQ - 2 Score 0 0 0 0 0 0 0    Assessment:   Care Plan Care Plan : Hypertension (Adult)  Updates made by Verlin Grills, RN since 07/29/2021 12:00 AM     Problem: Hypertension (Hypertension)   Priority: High  Onset Date: 12/11/2020     Long-Range Goal: Hypertension Monitored   Start Date: 12/11/2020  Expected End Date: 12/26/2021  This Visit's Progress: On track  Priority: High  Note:   Evidence-based  guidance:  Promote initial use of ambulatory blood pressure measurements (for 3 days) to rule out "white-coat" effect; identify masked hypertension and presence or absence of nocturnal "dipping" of blood pressure.   Encourage continued use of home blood pressure monitoring and recording in blood pressure log; include symptoms of hypotension or potential medication side effects in log.  Review blood pressure measurements taken inside and outside of the provider office; establish baseline and monitor trends; compare to target ranges or patient goal.  Share overall cardiovascular risk with patient; encourage changes to lifestyle risk factors, including alcohol consumption, smoking, inadequate exercise, poor dietary habits and stress.   Notes:  [id number]  blood pressure is being monitored daily by caregiver [name]    Problem: Disease Progression (Hypertension)   Priority: Medium  Onset Date: 12/11/2020     Long-Range Goal: Disease Progression Prevented or Minimized   Start Date: 12/11/2020  Expected End Date: 06/26/2021  This Visit's Progress: Not on track  Note:   Evidence-based guidance:  Tailor lifestyle advice to individual; review progress regularly; give frequent encouragement and respond positively to incremental successes.  Assess for and promote awareness of worsening disease or development of comorbidity.  Prepare patient for laboratory and diagnostic exams based on risk and presentation.  Prepare patient for use of pharmacologic therapy that may include diuretic, beta-blocker, beta-blocker/thiazide combination, angiotensin-converting enzyme inhibitor, renin-angiotensin blocker or calcium-channel blocker.  Expect periodic adjustments to pharmacologic therapy; manage side effects.  Promote a healthy diet that includes primarily plant-based foods, such as fruits, vegetables, whole grains, beans and legumes, low-fat dairy and lean meats.   Consider moderate reduction in sodium  intake by avoiding the addition of salt to prepared foods and limiting processed meats, canned soup, frozen meals and salty snacks.   Promote a regular, daily exercise goal of 150 minutes per week of moderate exercise based on tolerance, ability and patient choice; consider referral to physical therapist, community wellness and/or activity program.  Encourage the avoidance of no more than 2 hours per day of sedentary activity, such as recreational screen time.  Review sources of stress; explore current coping strategies and encourage use of mindfulness, yoga, meditation or exercise to manage stress.   Notes:  [id number] Per caregiver he is having more difficulty ambulating    Problem: Resistant Hypertension (Hypertension)   Priority: Medium  Onset Date: 12/11/2020     Long-Range Goal: Response to Treatment Maximized   Start Date: 12/11/2020  Expected End Date: 12/26/2021  This Visit's Progress: Not on track  Priority: Medium  Note:   Evidence-based guidance:  Assess patient response to treatment, including presence or absence of medication side effects, degree of blood pressure control and patient satisfaction.  Assess technique (including cuff size and placement), measurement times, condition and calibration of blood pressure cuff set (both at-home and in-office equipment).  Assess factors that may influence response to treatment, including nonadherence to pharmacologic treatment plan,  diet or activity changes and/or presence of pain, stress or sleep disturbance.  Screen for signs and symptoms of depression; if present, refer for or complete a comprehensive assessment.  Evaluate social and economic barriers that may affect adherence to treatment plan  Address pharmacologic nonadherence by simplifying dosing regimen, counseling or support by pharmacist, financial assistance, self-monitoring of blood pressure, use of motivational interviewing, voice or text messages.  Encourage behavioral  adherence strategies, like habit-based interventions that link medication taking with existing daily routines.  Assess barriers to regular, daily physical activity; support family or support person-oriented activity changes and utilization of community activity or sports program.  Address barriers to dietary changes, especially sodium restriction, with referrals to community programs, like cooking classes, meal services or intensive education when available.  Refer to community-based peer support program or nurse home-visiting program.  Assess for chronic pain; when present add additional goals (Chronic Pain Care Plan Guide) as needed.  Provide frequent follow-up by telephone, telemonitoring, patient-practice portal or with home visit.  Review alcohol use screen; address using brief intervention beginning with risk that interferes with blood pressure control; refer for treatment when excessive alcohol use is noted.  Screen for obstructive sleep apnea; prepare patient for polysomnography based on risk and presentation and use of noninvasive ventilation to relieve obstructive sleep apnea when present.   Notes:  07/29/2021 Per care giver the patientis taking his medications but his readings are ranging from 119/64-194/93      Goals Addressed             This Visit's Progress    (THN)Prevent falls   Not on track    Timeframe:  Short-Term Goal Priority:  Medium Start Date:   [id number]                          Expected End Date:      [id number] Follow up outreach call  [id number]   No falls since last outreach [phone] Per caregiver  [name] he went to the patient house and found him on the floor. The evening nurse had not come. There were no apparent injuries.      (THN)Track and Manage My Blood Pressure-Hypertension   On track    Timeframe:  Short-Term Goal Priority:  High Start Date:        [id number]                     Expected End Date:       [id number]        Follow Up  [id number]   - check blood pressure daily - choose a place to take my blood pressure (home, clinic or office, retail store) - write blood pressure results in a log or diary    Why is this important?   You won't feel high blood pressure, but it can still hurt your blood vessels.  High blood pressure can cause heart or kidney problems. It can also cause a stroke.  Making lifestyle changes like losing a little weight or eating less salt will help.  Checking your blood pressure at home and at different times of the day can help to control blood pressure.  If the doctor prescribes medicine remember to take it the way the doctor ordered.  Call the office if you cannot afford the medicine or if there are questions about it.     Notes:  Patient caregiver is monitoring daily and documenting and providing  all reading to health coach on follow ups calls [phone] The caregiver is monitoring daily. He is documenting the BP and Hr      Blood Pressure < 140/90       CARE PLAN ENTRY Timeframe:  Long-Range Goal Priority:  High Start Date:  [id number]        End    Date       [id number] Follow [id number]                 (see longtitudinal plan of care for additional care plan information)  Objective:  Last practice recorded BP readings:  BP Readings from Last 3 Encounters:  06/04/20 135/65  02/02/20 (!) 145/73  01/25/20 132/66   Most recent eGFR/CrCl: No results found for: EGFR  No components found for: CRCL  Current Barriers:  Knowledge deficit related to self care management of hypertension Cognitive Deficits  Case Manager Clinical Goal(s):  Over the next 90 days, patient will attend all scheduled medical appointments:   Over the next 90 days, patient will demonstrate improved adherence to prescribed treatment plan for hypertension as evidenced by taking all medications as prescribed, monitoring and recording blood pressure as directed, adhering to low sodium/DASH diet Over the next 90 days,  patient will demonstrate improved health management independence as evidenced by checking blood pressure as directed and notifying PCP if SBP>180 or DBP > 96, taking all medications as prescribe, and adhering to a low sodium diet as discussed. Over the next 90 days, patient will verbalize basic understanding of hypertension disease process and self health management plan as evidenced by adhering to medication, keeping appointments and checking and documenting blood pressures by caregiver.  Interventions:  Evaluation of current treatment plan related to hypertension self management and patient's adherence to plan as established by provider. Reviewed medications with patient and discussed importance of compliance Discussed plans with patient for ongoing care management follow up and provided patient with direct contact information for care management team Advised patient, providing education and rationale, to monitor blood pressure daily and record, calling PCP for findings outside established parameters.  Reviewed scheduled/upcoming provider appointments including:  Provided education regarding s/s of stroke and stroke prevention Provided education regarding s/s of heart attack Provided education regarding s/s of DASH diet/Low salt diet Provided education regarding complications of uncontrolled blood pressure Provided education regarding increasing physical activity Provided educational material: Matter of Choice Blood Pressure Control Provided educational material: Exercise Program Activity Book  Patient Self Care Activities:  Attends all scheduled provider appointments Calls provider office for new concerns, questions, or BP outside discussed parameters Monitors BP and records as discussed Adheres to a low sodium diet/DASH diet Increase physical activity as tolerated  Patient recent blood pressures are on better range of 130/82-105/59 03/19/2021 104/57 -151/85 [id number] Blood pressure  ranging from 119/64-194/93         Plan:  Follow-up: Patient agrees to Care Plan and Follow-up. RN sent additional information on hypertension Caregiver will continue to monitor and document blood pressure RN discussed COVID precautions and symptoms RN will follow up within the month of October RN sent update assessment to PCP  [place] [facility] [phone]

## 2021-07-29 NOTE — Patient Instructions (Signed)
Goals Addressed             This Visit's Progress    (THN)Prevent falls   Not on track    Timeframe:  Short-Term Goal Priority:  Medium Start Date:   [id number]                          Expected End Date:      [id number] Follow up outreach call  [id number]   No falls since last outreach [phone] Per caregiver  [name] he went to the patient house and found him on the floor. The evening nurse had not come. There were no apparent injuries.      (THN)Track and Manage My Blood Pressure-Hypertension   On track    Timeframe:  Short-Term Goal Priority:  High Start Date:        [id number]                     Expected End Date:       54656812        Follow Up [id number]   - check blood pressure daily - choose a place to take my blood pressure (home, clinic or office, retail store) - write blood pressure results in a log or diary    Why is this important?   You won't feel high blood pressure, but it can still hurt your blood vessels.  High blood pressure can cause heart or kidney problems. It can also cause a stroke.  Making lifestyle changes like losing a little weight or eating less salt will help.  Checking your blood pressure at home and at different times of the day can help to control blood pressure.  If the doctor prescribes medicine remember to take it the way the doctor ordered.  Call the office if you cannot afford the medicine or if there are questions about it.     Notes:  Patient caregiver is monitoring daily and documenting and providing all reading to health coach on follow ups calls [phone] The caregiver is monitoring daily. He is documenting the BP and Hr      Blood Pressure < 140/90       CARE PLAN ENTRY Timeframe:  Long-Range Goal Priority:  High Start Date:  [id number]        End    Date       [id number] Follow [id number]                 (see longtitudinal plan of care for additional care plan information)  Objective:  Last practice recorded BP readings:   BP Readings from Last 3 Encounters:  06/04/20 135/65  02/02/20 (!) 145/73  01/25/20 132/66   Most recent eGFR/CrCl: No results found for: EGFR  No components found for: CRCL  Current Barriers:  Knowledge deficit related to self care management of hypertension Cognitive Deficits  Case Manager Clinical Goal(s):  Over the next 90 days, patient will attend all scheduled medical appointments:   Over the next 90 days, patient will demonstrate improved adherence to prescribed treatment plan for hypertension as evidenced by taking all medications as prescribed, monitoring and recording blood pressure as directed, adhering to low sodium/DASH diet Over the next 90 days, patient will demonstrate improved health management independence as evidenced by checking blood pressure as directed and notifying PCP if SBP>180 or DBP > 96, taking all medications as prescribe, and adhering to a low sodium diet as discussed.  Over the next 90 days, patient will verbalize basic understanding of hypertension disease process and self health management plan as evidenced by adhering to medication, keeping appointments and checking and documenting blood pressures by caregiver.  Interventions:  Evaluation of current treatment plan related to hypertension self management and patient's adherence to plan as established by provider. Reviewed medications with patient and discussed importance of compliance Discussed plans with patient for ongoing care management follow up and provided patient with direct contact information for care management team Advised patient, providing education and rationale, to monitor blood pressure daily and record, calling PCP for findings outside established parameters.  Reviewed scheduled/upcoming provider appointments including:  Provided education regarding s/s of stroke and stroke prevention Provided education regarding s/s of heart attack Provided education regarding s/s of DASH diet/Low salt  diet Provided education regarding complications of uncontrolled blood pressure Provided education regarding increasing physical activity Provided educational material: Matter of Choice Blood Pressure Control Provided educational material: Exercise Program Activity Book  Patient Self Care Activities:  Attends all scheduled provider appointments Calls provider office for new concerns, questions, or BP outside discussed parameters Monitors BP and records as discussed Adheres to a low sodium diet/DASH diet Increase physical activity as tolerated  Patient recent blood pressures are on better range of 130/82-105/59 03/19/2021 104/57 -151/85 [id number] Blood pressure ranging from 119/64-194/93

## 2021-07-30 ENCOUNTER — Telehealth: Payer: Self-pay | Admitting: Family Medicine

## 2021-07-30 DIAGNOSIS — D630 Anemia in neoplastic disease: Secondary | ICD-10-CM | POA: Diagnosis not present

## 2021-07-30 DIAGNOSIS — F0280 Dementia in other diseases classified elsewhere without behavioral disturbance: Secondary | ICD-10-CM | POA: Diagnosis not present

## 2021-07-30 DIAGNOSIS — I10 Essential (primary) hypertension: Secondary | ICD-10-CM | POA: Diagnosis not present

## 2021-07-30 DIAGNOSIS — F419 Anxiety disorder, unspecified: Secondary | ICD-10-CM | POA: Diagnosis not present

## 2021-07-30 DIAGNOSIS — C61 Malignant neoplasm of prostate: Secondary | ICD-10-CM | POA: Diagnosis not present

## 2021-07-30 DIAGNOSIS — G20 Parkinson's disease: Secondary | ICD-10-CM | POA: Diagnosis not present

## 2021-07-30 NOTE — Telephone Encounter (Signed)
Called patient's home to check status, no answer, unable to leave voicemail.   Called daughter Casey Fisher for update.  Not eating as well as before, some questions on whether he is wanting to eat or food choices.  She will be there again this weekend to check on him.  Currently nephew, and his friends Casey Fisher and Casey Fisher have been helping to care for him.  He does have physical therapy weekly(Casey Fisher or Casey Fisher - spelling?), and home health nurse Casey Fisher visited today without known concerns.  He does have an aide once per week, but question from [place] whether aide would be available more often, up to daily to help with bathing primarily. Casey Fisher goals are to remain in his home.  Casey Fisher did note that the falling seemed to have improved.  Please schedule video visit with me next week if possible, with his nephew Casey Fisher present, and friends Casey Fisher and Casey Fisher if possible, with Casey Fisher on phone.   Please check to see if home health aide is available more often. Please provide me with contact info for his social worker as well. Thanks.

## 2021-07-31 ENCOUNTER — Telehealth: Payer: Self-pay | Admitting: Family Medicine

## 2021-07-31 DIAGNOSIS — F419 Anxiety disorder, unspecified: Secondary | ICD-10-CM | POA: Diagnosis not present

## 2021-07-31 DIAGNOSIS — G20 Parkinson's disease: Secondary | ICD-10-CM | POA: Diagnosis not present

## 2021-07-31 DIAGNOSIS — F0280 Dementia in other diseases classified elsewhere without behavioral disturbance: Secondary | ICD-10-CM | POA: Diagnosis not present

## 2021-07-31 DIAGNOSIS — D630 Anemia in neoplastic disease: Secondary | ICD-10-CM | POA: Diagnosis not present

## 2021-07-31 DIAGNOSIS — C61 Malignant neoplasm of prostate: Secondary | ICD-10-CM | POA: Diagnosis not present

## 2021-07-31 DIAGNOSIS — I10 Essential (primary) hypertension: Secondary | ICD-10-CM | POA: Diagnosis not present

## 2021-07-31 NOTE — Telephone Encounter (Signed)
Called patient's daughter [name]. She was able to provide the name and number for his social worker: Jackelyn Poling [phone]. Per Jackelyn Poling, they are already offering all the services his insurance will pay for. She states she was speaking with the patient's family about this. He would have to pay out of pocket for someone to be able to come more frequently.

## 2021-07-31 NOTE — Telephone Encounter (Signed)
..  Home Health Certification or Plan of Care Tracking  Is this a Certification or Plan of Care?  Yes  [place]: [facility]  Order Number:  [id number]  Has charge sheet been attached? yes  Where has form been placed:   In Dr. Vonna Kotyk bin up front

## 2021-08-01 ENCOUNTER — Other Ambulatory Visit: Payer: Self-pay

## 2021-08-01 ENCOUNTER — Encounter (HOSPITAL_COMMUNITY): Payer: Self-pay

## 2021-08-01 ENCOUNTER — Emergency Department (HOSPITAL_COMMUNITY): Payer: Medicare Other

## 2021-08-01 ENCOUNTER — Inpatient Hospital Stay (HOSPITAL_COMMUNITY)
Admission: EM | Admit: 2021-08-01 | Discharge: 2021-08-07 | DRG: 640 | Disposition: A | Discharge status: Skilled Nursing Facility | Payer: Medicare Other | Attending: Internal Medicine | Admitting: Internal Medicine

## 2021-08-01 DIAGNOSIS — D6959 Other secondary thrombocytopenia: Secondary | ICD-10-CM | POA: Diagnosis present

## 2021-08-01 DIAGNOSIS — E871 Hypo-osmolality and hyponatremia: Secondary | ICD-10-CM | POA: Diagnosis not present

## 2021-08-01 DIAGNOSIS — R9431 Abnormal electrocardiogram [ECG] [EKG]: Secondary | ICD-10-CM | POA: Diagnosis not present

## 2021-08-01 DIAGNOSIS — F0280 Dementia in other diseases classified elsewhere without behavioral disturbance: Secondary | ICD-10-CM | POA: Diagnosis present

## 2021-08-01 DIAGNOSIS — Z66 Do not resuscitate: Secondary | ICD-10-CM | POA: Diagnosis present

## 2021-08-01 DIAGNOSIS — N400 Enlarged prostate without lower urinary tract symptoms: Secondary | ICD-10-CM | POA: Diagnosis not present

## 2021-08-01 DIAGNOSIS — Z87891 Personal history of nicotine dependence: Secondary | ICD-10-CM | POA: Diagnosis not present

## 2021-08-01 DIAGNOSIS — E43 Unspecified severe protein-calorie malnutrition: Secondary | ICD-10-CM | POA: Diagnosis present

## 2021-08-01 DIAGNOSIS — G20 Parkinson's disease: Secondary | ICD-10-CM | POA: Diagnosis present

## 2021-08-01 DIAGNOSIS — G609 Hereditary and idiopathic neuropathy, unspecified: Secondary | ICD-10-CM | POA: Diagnosis not present

## 2021-08-01 DIAGNOSIS — I444 Left anterior fascicular block: Secondary | ICD-10-CM | POA: Diagnosis present

## 2021-08-01 DIAGNOSIS — E861 Hypovolemia: Secondary | ICD-10-CM | POA: Diagnosis present

## 2021-08-01 DIAGNOSIS — I10 Essential (primary) hypertension: Secondary | ICD-10-CM | POA: Diagnosis not present

## 2021-08-01 DIAGNOSIS — Z808 Family history of malignant neoplasm of other organs or systems: Secondary | ICD-10-CM

## 2021-08-01 DIAGNOSIS — R531 Weakness: Secondary | ICD-10-CM

## 2021-08-01 DIAGNOSIS — I5032 Chronic diastolic (congestive) heart failure: Secondary | ICD-10-CM | POA: Diagnosis present

## 2021-08-01 DIAGNOSIS — G629 Polyneuropathy, unspecified: Secondary | ICD-10-CM | POA: Diagnosis present

## 2021-08-01 DIAGNOSIS — Z7982 Long term (current) use of aspirin: Secondary | ICD-10-CM

## 2021-08-01 DIAGNOSIS — J9811 Atelectasis: Secondary | ICD-10-CM | POA: Diagnosis not present

## 2021-08-01 DIAGNOSIS — R5381 Other malaise: Secondary | ICD-10-CM | POA: Diagnosis present

## 2021-08-01 DIAGNOSIS — R609 Edema, unspecified: Secondary | ICD-10-CM | POA: Diagnosis not present

## 2021-08-01 DIAGNOSIS — Z20822 Contact with and (suspected) exposure to covid-19: Secondary | ICD-10-CM | POA: Diagnosis present

## 2021-08-01 DIAGNOSIS — Z6820 Body mass index (BMI) 20.0-20.9, adult: Secondary | ICD-10-CM | POA: Diagnosis not present

## 2021-08-01 DIAGNOSIS — I110 Hypertensive heart disease with heart failure: Secondary | ICD-10-CM | POA: Diagnosis present

## 2021-08-01 DIAGNOSIS — M6281 Muscle weakness (generalized): Secondary | ICD-10-CM | POA: Diagnosis not present

## 2021-08-01 DIAGNOSIS — C61 Malignant neoplasm of prostate: Secondary | ICD-10-CM | POA: Diagnosis not present

## 2021-08-01 DIAGNOSIS — E785 Hyperlipidemia, unspecified: Secondary | ICD-10-CM | POA: Diagnosis present

## 2021-08-01 DIAGNOSIS — E876 Hypokalemia: Secondary | ICD-10-CM | POA: Diagnosis present

## 2021-08-01 DIAGNOSIS — Z8546 Personal history of malignant neoplasm of prostate: Secondary | ICD-10-CM | POA: Diagnosis not present

## 2021-08-01 DIAGNOSIS — F32A Depression, unspecified: Secondary | ICD-10-CM | POA: Diagnosis not present

## 2021-08-01 DIAGNOSIS — R638 Other symptoms and signs concerning food and fluid intake: Secondary | ICD-10-CM

## 2021-08-01 DIAGNOSIS — R0902 Hypoxemia: Secondary | ICD-10-CM | POA: Diagnosis not present

## 2021-08-01 DIAGNOSIS — F0390 Unspecified dementia without behavioral disturbance: Secondary | ICD-10-CM | POA: Diagnosis not present

## 2021-08-01 DIAGNOSIS — G6289 Other specified polyneuropathies: Secondary | ICD-10-CM | POA: Diagnosis not present

## 2021-08-01 DIAGNOSIS — I6789 Other cerebrovascular disease: Secondary | ICD-10-CM | POA: Diagnosis not present

## 2021-08-01 DIAGNOSIS — R413 Other amnesia: Secondary | ICD-10-CM | POA: Diagnosis not present

## 2021-08-01 DIAGNOSIS — R4182 Altered mental status, unspecified: Secondary | ICD-10-CM | POA: Diagnosis not present

## 2021-08-01 DIAGNOSIS — N179 Acute kidney failure, unspecified: Secondary | ICD-10-CM | POA: Diagnosis present

## 2021-08-01 DIAGNOSIS — Z515 Encounter for palliative care: Secondary | ICD-10-CM | POA: Diagnosis not present

## 2021-08-01 DIAGNOSIS — D696 Thrombocytopenia, unspecified: Secondary | ICD-10-CM | POA: Diagnosis not present

## 2021-08-01 DIAGNOSIS — F419 Anxiety disorder, unspecified: Secondary | ICD-10-CM | POA: Diagnosis not present

## 2021-08-01 DIAGNOSIS — E870 Hyperosmolality and hypernatremia: Principal | ICD-10-CM | POA: Diagnosis present

## 2021-08-01 DIAGNOSIS — R404 Transient alteration of awareness: Secondary | ICD-10-CM | POA: Diagnosis not present

## 2021-08-01 DIAGNOSIS — I491 Atrial premature depolarization: Secondary | ICD-10-CM | POA: Diagnosis not present

## 2021-08-01 DIAGNOSIS — G20C Parkinsonism, unspecified: Secondary | ICD-10-CM

## 2021-08-01 DIAGNOSIS — M1711 Unilateral primary osteoarthritis, right knee: Secondary | ICD-10-CM | POA: Diagnosis not present

## 2021-08-01 DIAGNOSIS — E860 Dehydration: Secondary | ICD-10-CM | POA: Diagnosis present

## 2021-08-01 DIAGNOSIS — D630 Anemia in neoplastic disease: Secondary | ICD-10-CM | POA: Diagnosis not present

## 2021-08-01 DIAGNOSIS — R1312 Dysphagia, oropharyngeal phase: Secondary | ICD-10-CM | POA: Diagnosis not present

## 2021-08-01 DIAGNOSIS — Z7401 Bed confinement status: Secondary | ICD-10-CM | POA: Diagnosis not present

## 2021-08-01 DIAGNOSIS — Z79899 Other long term (current) drug therapy: Secondary | ICD-10-CM | POA: Diagnosis not present

## 2021-08-01 DIAGNOSIS — Z7189 Other specified counseling: Secondary | ICD-10-CM | POA: Diagnosis not present

## 2021-08-01 LAB — COMPREHENSIVE METABOLIC PANEL
ALT: 15 U/L (ref 0–44)
AST: 30 U/L (ref 15–41)
Albumin: 3.4 g/dL — ABNORMAL LOW (ref 3.5–5.0)
Alkaline Phosphatase: 82 U/L (ref 38–126)
Anion gap: 10 (ref 5–15)
BUN: 23 mg/dL (ref 8–23)
CO2: 35 mmol/L — ABNORMAL HIGH (ref 22–32)
Calcium: 9 mg/dL (ref 8.9–10.3)
Chloride: 121 mmol/L — ABNORMAL HIGH (ref 98–111)
Creatinine, Ser: 1.92 mg/dL — ABNORMAL HIGH (ref 0.61–1.24)
GFR, Estimated: 32 mL/min — ABNORMAL LOW (ref >60)
Glucose, Bld: 124 mg/dL — ABNORMAL HIGH (ref 70–99)
Potassium: 2.5 mmol/L — CL (ref 3.5–5.1)
Sodium: 166 mmol/L (ref 135–145)
Total Bilirubin: 1.1 mg/dL (ref 0.3–1.2)
Total Protein: 7.2 g/dL (ref 6.5–8.1)

## 2021-08-01 LAB — CBC WITH DIFFERENTIAL/PLATELET
Abs Immature Granulocytes: 0.02 10*3/uL (ref 0.00–0.07)
Basophils Absolute: 0 10*3/uL (ref 0.0–0.1)
Basophils Relative: 1 %
Eosinophils Absolute: 0.1 10*3/uL (ref 0.0–0.5)
Eosinophils Relative: 2 %
HCT: 47.3 % (ref 39.0–52.0)
Hemoglobin: 14.1 g/dL (ref 13.0–17.0)
Immature Granulocytes: 0 %
Lymphocytes Relative: 17 %
Lymphs Abs: 1 10*3/uL (ref 0.7–4.0)
MCH: 28.6 pg (ref 26.0–34.0)
MCHC: 29.8 g/dL — ABNORMAL LOW (ref 30.0–36.0)
MCV: 95.9 fL (ref 80.0–100.0)
Monocytes Absolute: 0.3 10*3/uL (ref 0.1–1.0)
Monocytes Relative: 5 %
Neutro Abs: 4.6 10*3/uL (ref 1.7–7.7)
Neutrophils Relative %: 75 %
Platelets: 103 10*3/uL — ABNORMAL LOW (ref 150–400)
RBC: 4.93 MIL/uL (ref 4.22–5.81)
RDW: 15.9 % — ABNORMAL HIGH (ref 11.5–15.5)
WBC: 6.1 10*3/uL (ref 4.0–10.5)
nRBC: 0 % (ref 0.0–0.2)

## 2021-08-01 LAB — URINALYSIS, ROUTINE W REFLEX MICROSCOPIC
Bacteria, UA: NONE SEEN
Bilirubin Urine: NEGATIVE
Glucose, UA: NEGATIVE mg/dL
Hgb urine dipstick: NEGATIVE
Ketones, ur: NEGATIVE mg/dL
Leukocytes,Ua: NEGATIVE
Nitrite: NEGATIVE
Protein, ur: 30 mg/dL — AB
Specific Gravity, Urine: 1.016 (ref 1.005–1.030)
pH: 6 (ref 5.0–8.0)

## 2021-08-01 LAB — RESP PANEL BY RT-PCR (FLU A&B, COVID) ARPGX2
Influenza A by PCR: NEGATIVE
Influenza B by PCR: NEGATIVE
SARS Coronavirus 2 by RT PCR: NEGATIVE

## 2021-08-01 LAB — LIPASE, BLOOD: Lipase: 49 U/L (ref 11–51)

## 2021-08-01 MED ORDER — POTASSIUM CHLORIDE 10 MEQ/100ML IV SOLN
10.0000 meq | INTRAVENOUS | Status: AC
  Administered 2021-08-01 – 2021-08-02 (×6): 10 meq via INTRAVENOUS
  Filled 2021-08-01 (×6): qty 100

## 2021-08-01 MED ORDER — DEXTROSE 5 % IV SOLN: INTRAVENOUS | Status: DC

## 2021-08-01 MED ORDER — POTASSIUM CHLORIDE 10 MEQ/100ML IV SOLN
10.0000 meq | Freq: Once | INTRAVENOUS | Status: AC
  Administered 2021-08-01: 10 meq via INTRAVENOUS
  Filled 2021-08-01: qty 100

## 2021-08-01 MED ORDER — LACTATED RINGERS IV BOLUS
1000.0000 mL | Freq: Once | INTRAVENOUS | Status: AC
  Administered 2021-08-01: 1000 mL via INTRAVENOUS

## 2021-08-01 NOTE — ED Notes (Signed)
Pt cath for urine, covid swab, labs drawn. 101m cola-colored dark concentrated urine from straight cath. Pt grandson at bedside, updated on POC.

## 2021-08-01 NOTE — ED Notes (Signed)
The pt  Is pulling  His lines and pulse o off  confused

## 2021-08-01 NOTE — ED Notes (Signed)
EDP at bedside, said we will wait until family arrives

## 2021-08-01 NOTE — ED Provider Notes (Signed)
[facility] EMERGENCY DEPARTMENT Provider Note   CSN: [id number] Arrival date & time: 08/01/21  1419     History Chief Complaint  Patient presents with   Weakness    Casey Fisher is a 87 y.o. male.   Weakness Severity:  Mild Duration:  2 weeks Context: not alcohol use, not allergies, not change in medication, not recent infection and not urinary tract infection   Associated symptoms: no abdominal pain, no aphasia, no arthralgias, no ataxia, no chest pain, no cough, no diarrhea, no difficulty walking, no dizziness, no drooling, no dysphagia, no dysuria, no numbness in extremities, no falls, no fever, no foul-smelling urine, no frequency, no headaches, no hematochezia, no lethargy, no loss of consciousness, no melena, no myalgias, no nausea, no near-syncope, no seizures, no sensory-motor deficit, no shortness of breath, no stroke symptoms, no syncope, no urgency, no vision change and no vomiting     Patient with a past medical history of parkinson's disease and dementia that presents with two weeks of weakness and poor PO intake. His nephew is at the bedside and reports that he has had two weeks of poor PO intake. He is at his mental baseline. They are concerned that he might have a UTI. He had minimal PO intake. He has not been swallowing well for the last two weeks and has gradually worsened to where he is eating a minimal amount. He will eat soft/liquid food like applesauce and jello.  Past Medical History:  Diagnosis Date   Abnormality of gait 03/24/2016   Anxiety    Cancer ([place])    Cataract    Depression    Heart murmur    Hereditary and idiopathic peripheral neuropathy 03/24/2016   Hypertension    Memory difficulty 03/24/2016   Neuromuscular disorder ([place])    Neuropathy    Parkinson's disease ([place]) 05/07/2021   Prostate cancer ([place])    remission    Patient Active Problem List   Diagnosis Date Noted   Hypernatremia 08/01/2021   AKI (acute kidney  injury) ([place]) 08/01/2021   Decreased oral intake 08/01/2021   Dehydration 08/01/2021   Parkinsonism ([place]) 08/01/2021   Parkinson's disease ([place]) 05/07/2021   Primary osteoarthritis of right knee 05/23/2020   Low back pain 08/03/2019   Acute pain of left shoulder 10/10/2018   Chest pain 11/08/2017   Memory difficulty 03/24/2016   Hereditary and idiopathic peripheral neuropathy 03/24/2016   Abnormality of gait 03/24/2016   Peripheral neuropathy 04/18/2015   Hyperlipidemia 04/18/2015   Prostate cancer ([place]) 08/24/2014   Right inguinal hernia 10/30/2013    Past Surgical History:  Procedure Laterality Date   CATARACT EXTRACTION Bilateral    EYE SURGERY     HERNIA [place] Right 12/06/2013   Procedure: HERNIA REPAIR INGUINAL ADULT;  Surgeon: Merrie Roof, MD;  Location: [place];  Service: General;  Laterality: Right;   INSERTION OF MESH Right 12/06/2013   Procedure: INSERTION OF MESH;  Surgeon: Merrie Roof, MD;  Location: [place];  Service: General;  Laterality: Right;   PROSTATE SURGERY         Family History  Problem Relation Age of Onset   Brain cancer Daughter    Heart disease Mother    Stroke Mother    Heart disease Father     Social History   Tobacco Use   Smoking status: Former   Smokeless tobacco: Never  Vaping Use   Vaping Use:  Never used  Substance Use Topics   Alcohol use: No   Drug use: No    Home Medications Prior to Admission medications   Medication Sig Start Date End Date Taking? Authorizing Provider  ammonium lactate (AMLACTIN) 12 % lotion Apply to both feet twice daily for dry skin 05/25/19   Marzetta Board, DPM  aspirin EC 81 MG tablet Take 81 mg by mouth daily. Reported on 05/14/2016 Patient not taking: Reported on 06/07/2021    [provider]  Calcium Carb-Cholecalciferol (CALCIUM 600 + D PO) Take 1 tablet by mouth daily.    [provider]  carbidopa-levodopa (SINEMET) 25-250 MG tablet Take  1 tablet by mouth 3 (three) times daily. 05/07/21   Kathrynn Ducking, MD  donepezil (ARICEPT) 5 MG tablet Take 1 tablet (5 mg total) by mouth at bedtime. 03/12/21   Suzzanne Cloud, NP  DULoxetine (CYMBALTA) 30 MG capsule Take 1 capsule (30 mg total) by mouth 2 (two) times daily. 05/01/21   Suzzanne Cloud, NP  ERLEADA 60 MG tablet Take 120 mg by mouth daily.  06/06/18   [provider]  gabapentin (NEURONTIN) 600 MG tablet TAKE 2 TABLETS BY MOUTH IN THE MORNING AND TAKE 2 TABLETS BY MOUTH AT MIDDAY AND TAKE 1 TABLET IN THE EVENING 05/12/21   [name], MD  hydrochlorothiazide (HYDRODIURIL) 12.5 MG tablet TAKE 1 TABLET(12.5 MG) BY MOUTH DAILY 05/12/21   [name], MD  Leuprolide Acetate (LUPRON IJ) Inject 1 application as directed every 6 (six) months.    [provider]  simvastatin (ZOCOR) 40 MG tablet TAKE 1 TABLET(40 MG) BY MOUTH DAILY 05/12/21   [name], MD  tamsulosin (FLOMAX) 0.4 MG CAPS capsule Take 0.4 mg daily by mouth. Reported on 05/14/2016 08/17/15   [provider]    Allergies    Lyrica [pregabalin]  Review of Systems   Review of Systems  Constitutional:  Positive for appetite change. Negative for chills, diaphoresis, fatigue and fever.  HENT:  Negative for congestion, dental problem, drooling, ear pain, facial swelling, hearing loss, nosebleeds, postnasal drip, rhinorrhea, sore throat and trouble swallowing.   Eyes:  Negative for photophobia, pain and visual disturbance.  Respiratory:  Negative for apnea, cough, choking, chest tightness, shortness of breath, wheezing and stridor.   Cardiovascular:  Negative for chest pain, palpitations, leg swelling, syncope and near-syncope.  Gastrointestinal:  Negative for abdominal distention, abdominal pain, constipation, diarrhea, dysphagia, hematochezia, melena, nausea and vomiting.  Endocrine: Negative for polydipsia and polyuria.  Genitourinary:  Negative for difficulty urinating, dysuria, flank  pain, frequency, hematuria and urgency.  Musculoskeletal:  Negative for arthralgias, back pain, falls, gait problem, myalgias, neck pain and neck stiffness.  Skin:  Negative for color change, rash and wound.  Allergic/Immunologic: Negative for environmental allergies and food allergies.  Neurological:  Positive for weakness. Negative for dizziness, tremors, seizures, loss of consciousness, syncope, facial asymmetry, speech difficulty, light-headedness, numbness and headaches.  Psychiatric/Behavioral:  Negative for behavioral problems and confusion.   All other systems reviewed and are negative.  Physical Exam Updated Vital Signs BP (!) 174/86   Pulse 66   Temp 97.8 F (36.6 C) (Oral)   Resp 20   Ht '6\' 2"'$  (1.88 m)   Wt 75 kg   SpO2 94%   BMI 21.23 kg/m   Physical Exam Vitals and nursing note reviewed.  Constitutional:      General: He is not in acute distress.    Appearance: Normal  appearance. He is normal weight.  HENT:     Head: Normocephalic and atraumatic.     Right Ear: External ear normal.     Left Ear: External ear normal.     Nose: Nose normal. No congestion.     Mouth/Throat:     Mouth: Mucous membranes are moist.     Pharynx: Oropharynx is clear. No oropharyngeal exudate or posterior oropharyngeal erythema.  Eyes:     General: No visual field deficit.    Extraocular Movements: Extraocular movements intact.     Conjunctiva/sclera: Conjunctivae normal.     Pupils: Pupils are equal, round, and reactive to light.  Cardiovascular:     Rate and Rhythm: Normal rate and regular rhythm.     Pulses: Normal pulses.     Heart sounds: Normal heart sounds. No murmur heard.   No friction rub. No gallop.  Pulmonary:     Effort: Pulmonary effort is normal. No respiratory distress.     Breath sounds: Normal breath sounds. No stridor. No wheezing, rhonchi or rales.  Chest:     Chest wall: No tenderness.  Abdominal:     General: Abdomen is flat. Bowel sounds are normal. There  is no distension.     Palpations: Abdomen is soft.     Tenderness: There is no abdominal tenderness. There is no right CVA tenderness, left CVA tenderness, guarding or rebound.  Musculoskeletal:        General: No swelling or tenderness. Normal range of motion.     Cervical back: Normal range of motion and neck supple. No rigidity, tenderness or bony tenderness.     Thoracic back: Normal. No tenderness or bony tenderness.     Lumbar back: Normal. No tenderness or bony tenderness.     Right lower leg: No edema.     Left lower leg: No edema.  Skin:    General: Skin is warm and dry.  Neurological:     General: No focal deficit present.     Mental Status: He is alert and oriented to person, place, and time. Mental status is at baseline.     Cranial Nerves: Cranial nerves are intact. No cranial nerve deficit, dysarthria or facial asymmetry.     Sensory: Sensation is intact. No sensory deficit.     Motor: Motor function is intact. No weakness.  Psychiatric:        Mood and Affect: Mood normal.        Behavior: Behavior normal.        Thought Content: Thought content normal.        Judgment: Judgment normal.    ED Results / Procedures / Treatments   Labs (all labs ordered are listed, but only abnormal results are displayed) Labs Reviewed  CBC WITH DIFFERENTIAL/PLATELET - Abnormal; Notable for the following components:      Result Value   MCHC 29.8 (*)    RDW 15.9 (*)    Platelets 103 (*)    All other components within normal limits  COMPREHENSIVE METABOLIC PANEL - Abnormal; Notable for the following components:   Sodium 166 (*)    Potassium 2.5 (*)    Chloride 121 (*)    CO2 35 (*)    Glucose, Bld 124 (*)    Creatinine, Ser 1.92 (*)    Albumin 3.4 (*)    GFR, Estimated 32 (*)    All other components within normal limits  URINALYSIS, ROUTINE W REFLEX MICROSCOPIC - Abnormal; Notable for the following components:  Color, Urine AMBER (*)    Protein, ur 30 (*)    All other  components within normal limits  RESP PANEL BY RT-PCR (FLU A&B, COVID) ARPGX2  LIPASE, BLOOD  AMMONIA  BASIC METABOLIC PANEL  BASIC METABOLIC PANEL  MAGNESIUM    EKG EKG Interpretation  Date/Time:  Friday August 01 2021 16:18:54 EDT Ventricular Rate:  78 PR Interval:  139 QRS Duration: 124 QT Interval:  503 QTC Calculation: 574 R Axis:   -67 Text Interpretation: Sinus rhythm Nonspecific IVCD with LAD Nonspecific T abnrm, anterolateral leads Confirmed by Pattricia Boss [phone]) on 08/01/2021 4:21:29 PM  Radiology CT HEAD WO CONTRAST (5MM)  Result Date: 08/01/2021 CLINICAL DATA:  Parkinson's disease.  Generalized weakness. EXAM: CT HEAD WITHOUT CONTRAST TECHNIQUE: Contiguous axial images were obtained from the base of the skull through the vertex without intravenous contrast. COMPARISON:  06/07/2021 FINDINGS: Brain: There is no evidence of an acute infarct, intracranial hemorrhage, mass, midline shift, or extra-axial fluid collection. Confluent hypodensities in the cerebral white matter bilaterally are unchanged and nonspecific but compatible with severe chronic small vessel ischemic disease. A chronic infarct is again noted involving the right basal ganglia and internal capsule. There is mild-to-moderate cerebral atrophy. Vascular: Calcified atherosclerosis at the skull base. No hyperdense vessel. Skull: No fracture or suspicious osseous lesion. Sinuses/Orbits: Paranasal sinuses and mastoid air cells are clear. Bilateral cataract extraction. Other: None. IMPRESSION: 1. No evidence of acute intracranial abnormality. 2. Severe chronic small vessel ischemic disease. Electronically Signed   By: Logan Bores M.D.   On: 08/01/2021 16:29   DG Chest Portable 1 View  Result Date: 08/01/2021 CLINICAL DATA:  Weakness EXAM: PORTABLE CHEST 1 VIEW COMPARISON:  06/07/2021, CT 10/29/2016 FINDINGS: Right lung grossly clear. Hazy atelectasis at the left base. No pleural effusion. Normal cardiac size. No  pneumothorax. IMPRESSION: No active disease. Mild chronic elevation left diaphragm with probable scarring and atelectasis Electronically Signed   By: Donavan Foil M.D.   On: 08/01/2021 16:21    Procedures Procedures   Medications Ordered in ED Medications  dextrose 5 % solution ( Intravenous New Bag/Given 08/01/21 1910)  potassium chloride 10 mEq in 100 mL IVPB (10 mEq Intravenous New Bag/Given 08/01/21 1913)  lactated ringers bolus 1,000 mL (0 mLs Intravenous Stopped 08/01/21 1724)  potassium chloride 10 mEq in 100 mL IVPB (0 mEq Intravenous Stopped 08/01/21 1847)    ED Course  I have reviewed the triage vital signs and the nursing notes.  Pertinent labs & imaging results that were available during my care of the patient were reviewed by me and considered in my medical decision making (see chart for details).    MDM Rules/Calculators/A&P                         PETRONILO LAYMAN is a 87 y.o. male with a past medical history of parkinson's disease and dementia that presents with two weeks of weakness and poor PO intake.  Patient is hemodynamically stable and in no acute distress.  Patient's nephew is at the bedside and states that he is at his current mental baseline.  He has not seen a change in his mental status.  He has had poor p.o. intake for the last 2 weeks.  He had generalized weakness.  His labs are notable for a negative COVID test.  His potassium was notable for being 2.5 he was given IV potassium.  He was given IV fluids.  His sodium  was elevated at 166.  Patient has AKI.  He has an elevated creatinine.  His UA shows no signs of infection.  CT head showed no acute intracranial abnormality.  Chest x-ray showed no acute cardiac or pulmonary abnormality.  EKG showed no acute signs of ischemia.  He denies any chest pain.  Patient will be admitted to hospitalist service for failure to thrive and hypernatremia.  Patient states compliance and understanding of the plan. I explained labs and  imaging to the patient. No further questions at this time from the patient.  The plan for this patient was discussed with Dr. Jeanell Sparrow, who voiced agreement and who oversaw evaluation and treatment of this patient.   Final Clinical Impression(s) / ED Diagnoses Final diagnoses:  Weakness  Dehydration  Hypernatremia  AKI (acute kidney injury) [facility])    Rx / DC Orders ED Discharge Orders     None        Doretha Sou, MD 08/01/21 1947    Pattricia Boss, MD 08/01/21 [phone]

## 2021-08-01 NOTE — ED Notes (Signed)
Report called to rn on 3e katie rn

## 2021-08-01 NOTE — ED Notes (Signed)
Pt GCS 13 but slow to move gaze, initially appeared fixed when turning pt's head but then as nurse was on R side and loudly asked pt to look to the right, pt did slowly turn eyes to right. VSS, no distress noted, awaiting provider to bedside. RN drawing preliminary labs. Awaiting family arrival for better history on chief complaint/baseline presentation.

## 2021-08-01 NOTE — H&P (Signed)
History and Physical    Casey Fisher [id number] DOB: [birth year] DOA: 08/01/2021  PCP: [name], MD Patient coming from: Home  Chief Complaint: Decreased oral intake  HPI: Casey Fisher is a 87 y.o. male with medical history significant of prostate cancer, parkinsonism/neuromuscular disorder/memory difficulty, peripheral neuropathy. Patient has not been swallowing well for about 2 weeks and has gradually worsened to where he takes very small mounts of food. They tried calling his PCP but earliest visit was 8/31. He will intake very soft/liquid foods like applesauce and jello, but very small portions.  ED Course: Vitals: Temperature 97 point degrees Fahrenheit, pulse 63 bpm, respirations 16/min, blood pressure 168/37 on arrival to ED, SPO2 95% on room air Labs: Sodium of 166, potassium of 2.5, chloride of 121, CO2 of 35, BUN/creatinine of 23/1.92, platelets of 103k Imaging: Chest x-ray significant for no active disease with probable scarring versus atelectasis and chronic elevation of left diaphragm Medications/Course: 1 L lactated ringer bolus, potassium chloride 10 mEq IV x1  Review of Systems: Review of Systems  Unable to perform ROS: Dementia  Constitutional:  Negative for chills and fever.  Cardiovascular:  Negative for chest pain.  Gastrointestinal:  Negative for nausea and vomiting.   Past Medical History:  Diagnosis Date   Abnormality of gait 03/24/2016   Anxiety    Cancer (HCC)    Cataract    Depression    Heart murmur    Hereditary and idiopathic peripheral neuropathy 03/24/2016   Hypertension    Memory difficulty 03/24/2016   Neuromuscular disorder ([place])    Neuropathy    Parkinson's disease ([place]) 05/07/2021   Prostate cancer ([place])    remission    Past Surgical History:  Procedure Laterality Date   CATARACT EXTRACTION Bilateral    EYE SURGERY     HERNIA REPAIR  1985   INGUINAL HERNIA REPAIR Right 12/06/2013   Procedure: HERNIA REPAIR INGUINAL  ADULT;  Surgeon: Merrie Roof, MD;  Location: [facility];  Service: General;  Laterality: Right;   INSERTION OF MESH Right 12/06/2013   Procedure: INSERTION OF MESH;  Surgeon: Merrie Roof, MD;  Location: [place];  Service: General;  Laterality: Right;   PROSTATE SURGERY       reports that he has quit smoking. He has never used smokeless tobacco. He reports that he does not drink alcohol and does not use drugs.  Allergies  Allergen Reactions   Lyrica [Pregabalin] Other (See Comments)    "constipation"    Family History  Problem Relation Age of Onset   Brain cancer Daughter    Heart disease Mother    Stroke Mother    Heart disease Father    Prior to Admission medications   Medication Sig Start Date End Date Taking? Authorizing Provider  ammonium lactate (AMLACTIN) 12 % lotion Apply to both feet twice daily for dry skin 05/25/19   Marzetta Board, DPM  aspirin EC 81 MG tablet Take 81 mg by mouth daily. Reported on 05/14/2016 Patient not taking: Reported on 06/07/2021    [provider]  Calcium Carb-Cholecalciferol (CALCIUM 600 + D PO) Take 1 tablet by mouth daily.    [provider]  carbidopa-levodopa (SINEMET) 25-250 MG tablet Take 1 tablet by mouth 3 (three) times daily. 05/07/21   Kathrynn Ducking, MD  donepezil (ARICEPT) 5 MG tablet Take 1 tablet (5 mg total) by mouth at bedtime. 03/12/21   Suzzanne Cloud, NP  DULoxetine (CYMBALTA) 30  MG capsule Take 1 capsule (30 mg total) by mouth 2 (two) times daily. 05/01/21   Suzzanne Cloud, NP  ERLEADA 60 MG tablet Take 120 mg by mouth daily.  06/06/18   [provider]  gabapentin (NEURONTIN) 600 MG tablet TAKE 2 TABLETS BY MOUTH IN THE MORNING AND TAKE 2 TABLETS BY MOUTH AT MIDDAY AND TAKE 1 TABLET IN THE EVENING 05/12/21   [name], MD  hydrochlorothiazide (HYDRODIURIL) 12.5 MG tablet TAKE 1 TABLET(12.5 MG) BY MOUTH DAILY 05/12/21   [name], MD  Leuprolide Acetate (LUPRON IJ) Inject 1 application  as directed every 6 (six) months.    [provider]  simvastatin (ZOCOR) 40 MG tablet TAKE 1 TABLET(40 MG) BY MOUTH DAILY 05/12/21   [name], MD  tamsulosin (FLOMAX) 0.4 MG CAPS capsule Take 0.4 mg daily by mouth. Reported on 05/14/2016 08/17/15   [provider]    Physical Exam:  Physical Exam Constitutional:      General: He is not in acute distress.    Appearance: He is not diaphoretic.  HENT:     Mouth/Throat:     Mouth: Mucous membranes are dry.     Comments: tacky Eyes:     Conjunctiva/sclera: Conjunctivae normal.     Pupils: Pupils are equal, round, and reactive to light.  Cardiovascular:     Rate and Rhythm: Normal rate and regular rhythm.     Heart sounds: Normal heart sounds. No murmur heard. Pulmonary:     Effort: Pulmonary effort is normal. No respiratory distress.     Breath sounds: Normal breath sounds. No wheezing or rales.  Abdominal:     General: Bowel sounds are normal. There is no distension.     Palpations: Abdomen is soft.     Tenderness: There is no abdominal tenderness. There is no guarding or rebound.  Musculoskeletal:        General: No tenderness. Normal range of motion.     Cervical back: Normal range of motion.  Lymphadenopathy:     Cervical: No cervical adenopathy.  Skin:    General: Skin is warm and dry.  Neurological:     Mental Status: He is alert. Mental status is at baseline.     Cranial Nerves: Cranial nerves are intact.     Sensory: Sensation is intact.    Labs on Admission: I have personally reviewed following labs and imaging studies  CBC: Recent Labs  Lab 08/01/21 1500  WBC 6.1  NEUTROABS 4.6  HGB 14.1  HCT 47.3  MCV 95.9  PLT 103*    Basic Metabolic Panel: Recent Labs  Lab 08/01/21 1500  NA 166*  K 2.5*  CL 121*  CO2 35*  GLUCOSE 124*  BUN 23  CREATININE 1.92*  CALCIUM 9.0    GFR: Estimated Creatinine Clearance: 26.6 mL/min (A) (by C-G formula based on SCr of 1.92 mg/dL  (H)).  Liver Function Tests: Recent Labs  Lab 08/01/21 1500  AST 30  ALT 15  ALKPHOS 82  BILITOT 1.1  PROT 7.2  ALBUMIN 3.4*   Recent Labs  Lab 08/01/21 1500  LIPASE 49   Urine analysis:    Component Value Date/Time   COLORURINE AMBER (A) 08/01/2021 1640   APPEARANCEUR CLEAR 08/01/2021 1640   LABSPEC 1.016 08/01/2021 1640   PHURINE 6.0 08/01/2021 1640   GLUCOSEU NEGATIVE 08/01/2021 1640   HGBUR NEGATIVE 08/01/2021 1640   BILIRUBINUR NEGATIVE 08/01/2021 1640   KETONESUR NEGATIVE 08/01/2021 1640  PROTEINUR 30 (A) 08/01/2021 1640   UROBILINOGEN 0.2 05/29/2012 1354   NITRITE NEGATIVE 08/01/2021 1640   LEUKOCYTESUR NEGATIVE 08/01/2021 1640   Radiological Exams on Admission: CT HEAD WO CONTRAST (5MM)  Result Date: 08/01/2021 CLINICAL DATA:  Parkinson's disease.  Generalized weakness. EXAM: CT HEAD WITHOUT CONTRAST TECHNIQUE: Contiguous axial images were obtained from the base of the skull through the vertex without intravenous contrast. COMPARISON:  06/07/2021 FINDINGS: Brain: There is no evidence of an acute infarct, intracranial hemorrhage, mass, midline shift, or extra-axial fluid collection. Confluent hypodensities in the cerebral white matter bilaterally are unchanged and nonspecific but compatible with severe chronic small vessel ischemic disease. A chronic infarct is again noted involving the right basal ganglia and internal capsule. There is mild-to-moderate cerebral atrophy. Vascular: Calcified atherosclerosis at the skull base. No hyperdense vessel. Skull: No fracture or suspicious osseous lesion. Sinuses/Orbits: Paranasal sinuses and mastoid air cells are clear. Bilateral cataract extraction. Other: None. IMPRESSION: 1. No evidence of acute intracranial abnormality. 2. Severe chronic small vessel ischemic disease. Electronically Signed   By: Logan Bores M.D.   On: 08/01/2021 16:29   DG Chest Portable 1 View  Result Date: 08/01/2021 CLINICAL DATA:  Weakness EXAM: PORTABLE  CHEST 1 VIEW COMPARISON:  06/07/2021, CT 10/29/2016 FINDINGS: Right lung grossly clear. Hazy atelectasis at the left base. No pleural effusion. Normal cardiac size. No pneumothorax. IMPRESSION: No active disease. Mild chronic elevation left diaphragm with probable scarring and atelectasis Electronically Signed   By: Donavan Foil M.D.   On: 08/01/2021 16:21    EKG: Independently reviewed. Sinus  Assessment/Plan Principal Problem:   Hypernatremia Active Problems:   Peripheral neuropathy   Hyperlipidemia   Memory difficulty   AKI (acute kidney injury) ([place])   Decreased oral intake   Dehydration   Parkinsonism (HCC)    Hypernatremia No associated hypovolemia; patient is hypertensive. Mental status baseline. Patient given normal saline IV fluids in the ED.  Sodium and chloride of 166/121 respectively on admission. -Start D5 water at 75 mL/h secondary to patient's history of grade 2 diastolic heart dysfunction -Check BMP every 8 hours  Concern for failure to thrive Poor oral intake Unclear why patient refuses to take in food.  Unsure if this is secondary to behavioral versus medical history.  Patient appears to agree to some things and disagreed to doing other things.  He does not give a reason for why he refuses to eat but does state that it does not hurt or taste bad, nor does he have issues with swallowing. -Regular diet -Dietitian consult -Speech therapy consult with likely modified barium swallow  AKI Baseline creatinine is about 1.1.  On admission, creatinine of 1.92 in setting of dehydration.  Given 1 L normal saline bolus in the ED. -IV fluids as mentioned above  Hypokalemia Patient given 10 mEq of potassium in the ED.  -Potassium IV 10 mEq x6 -Check magnesium  Primary hypertension Patient is on hydrochlorothiazide as an outpatient.  Hold on admission.  Blood pressure is currently uncontrolled. -Start amlodipine 5 mg daily and titrate up as  needed  Parkinsonism Neuromuscular disorder Memory difficulty No formal diagnosis of dementia.  Patient is on Aricept and Sinemet as an outpatient follows with Dr. [name], neurology. -Continue Sinemet and Aricept in addition to duloxetine (dose confirmed with daughter); if renal function worsens, may need to decrease dose of Cymbalta -Once he starts eating, will check daily magnesium, phosphorus, potassium  Peripheral neuropathy Patient is prescribed gabapentin as an outpatient.  Will  await completion of medication history prior to prescribing.  Chronic diastolic heart dysfunction Echo from 2018 significant for normal EF and grade 2 diastolic dysfunction.  Patient is currently dehydrated.  Hyperlipidemia On simvastatin as an outpatient but unconfirmed.  Thrombocytopenia Mild. -Repeat CBC  History of prostate cancer Currently in remission.  Patient is on tamsulosin, Erleada, Lupron as an outpatient per med history.  Medications currently unconfirmed.   DVT prophylaxis: Lovenox Code Status: DNR (confirmed with daughter) Family Communication: Daughter on telephone (27 minutes) Disposition Plan: Discharge possibly home versus SNF in at least 3 to 5 days pending treatment of sodium in addition to work-up of swallowing difficulty and possible goals of care discussions Consults called: None Admission status: Inpatient   Cordelia Poche, MD [facility] 08/01/2021, 5:45 PM

## 2021-08-01 NOTE — ED Notes (Signed)
Notified Dr Jeanell Sparrow of critical sodium, potassium

## 2021-08-01 NOTE — ED Triage Notes (Signed)
Pt BIB EMS after family calls out d/t poor PO intake x 1 week, increased sleepiness, "acting different." Pt with hx dementia and parkinsons, nonambulatory at baseline. Lives at home c family. Pt GCS 13 at baseline per EMS, presents as same. No distress noted.

## 2021-08-01 NOTE — ED Notes (Signed)
Pt drowsy keeps taking his covers off he reports that he is hot

## 2021-08-02 LAB — CBC
HCT: 44.1 % (ref 39.0–52.0)
Hemoglobin: 13.6 g/dL (ref 13.0–17.0)
MCH: 28.5 pg (ref 26.0–34.0)
MCHC: 30.8 g/dL (ref 30.0–36.0)
MCV: 92.3 fL (ref 80.0–100.0)
Platelets: 98 10*3/uL — ABNORMAL LOW (ref 150–400)
RBC: 4.78 MIL/uL (ref 4.22–5.81)
RDW: 15.8 % — ABNORMAL HIGH (ref 11.5–15.5)
WBC: 6.4 10*3/uL (ref 4.0–10.5)
nRBC: 0 % (ref 0.0–0.2)

## 2021-08-02 LAB — BASIC METABOLIC PANEL
Anion gap: 13 (ref 5–15)
Anion gap: 8 (ref 5–15)
Anion gap: 7 (ref 5–15)
BUN: 18 mg/dL (ref 8–23)
BUN: 18 mg/dL (ref 8–23)
BUN: 16 mg/dL (ref 8–23)
CO2: 28 mmol/L (ref 22–32)
CO2: 32 mmol/L (ref 22–32)
CO2: 32 mmol/L (ref 22–32)
Calcium: 7.9 mg/dL — ABNORMAL LOW (ref 8.9–10.3)
Calcium: 8.6 mg/dL — ABNORMAL LOW (ref 8.9–10.3)
Calcium: 8.4 mg/dL — ABNORMAL LOW (ref 8.9–10.3)
Chloride: 118 mmol/L — ABNORMAL HIGH (ref 98–111)
Chloride: 115 mmol/L — ABNORMAL HIGH (ref 98–111)
Chloride: 117 mmol/L — ABNORMAL HIGH (ref 98–111)
Creatinine, Ser: 1.61 mg/dL — ABNORMAL HIGH (ref 0.61–1.24)
Creatinine, Ser: 1.52 mg/dL — ABNORMAL HIGH (ref 0.61–1.24)
Creatinine, Ser: 1.53 mg/dL — ABNORMAL HIGH (ref 0.61–1.24)
GFR, Estimated: 40 mL/min — ABNORMAL LOW (ref >60)
GFR, Estimated: 43 mL/min — ABNORMAL LOW (ref >60)
GFR, Estimated: 43 mL/min — ABNORMAL LOW (ref >60)
Glucose, Bld: 135 mg/dL — ABNORMAL HIGH (ref 70–99)
Glucose, Bld: 140 mg/dL — ABNORMAL HIGH (ref 70–99)
Glucose, Bld: 149 mg/dL — ABNORMAL HIGH (ref 70–99)
Potassium: 3.2 mmol/L — ABNORMAL LOW (ref 3.5–5.1)
Potassium: 2.2 mmol/L — CL (ref 3.5–5.1)
Potassium: 2.6 mmol/L — CL (ref 3.5–5.1)
Sodium: 159 mmol/L — ABNORMAL HIGH (ref 135–145)
Sodium: 155 mmol/L — ABNORMAL HIGH (ref 135–145)
Sodium: 156 mmol/L — ABNORMAL HIGH (ref 135–145)

## 2021-08-02 LAB — AMMONIA: Ammonia: 26 umol/L (ref 9–35)

## 2021-08-02 LAB — POTASSIUM: Potassium: 2.7 mmol/L — CL (ref 3.5–5.1)

## 2021-08-02 LAB — MAGNESIUM: Magnesium: 2.2 mg/dL (ref 1.7–2.4)

## 2021-08-02 MED ORDER — POTASSIUM CHLORIDE 10 MEQ/100ML IV SOLN
10.0000 meq | INTRAVENOUS | Status: AC
Start: 2021-08-02 — End: 2021-08-03
  Administered 2021-08-02 – 2021-08-03 (×6): 10 meq via INTRAVENOUS
  Filled 2021-08-02 (×6): qty 100

## 2021-08-02 MED ORDER — APALUTAMIDE 60 MG PO TABS
120.0000 mg | ORAL_TABLET | Freq: Every day | ORAL | Status: DC
Start: 2021-08-03 — End: 2021-08-05
  Administered 2021-08-03: 120 mg via ORAL
  Filled 2021-08-02 (×4): qty 2

## 2021-08-02 MED ORDER — CARBIDOPA-LEVODOPA 25-250 MG PO TABS
1.0000 | ORAL_TABLET | Freq: Three times a day (TID) | ORAL | Status: DC
  Administered 2021-08-02 – 2021-08-07 (×16): 1 via ORAL
  Filled 2021-08-02 (×18): qty 1

## 2021-08-02 MED ORDER — ONDANSETRON HCL 4 MG/2ML IJ SOLN: 4.0000 mg | Freq: Four times a day (QID) | INTRAMUSCULAR | Status: DC | PRN

## 2021-08-02 MED ORDER — DULOXETINE HCL 30 MG PO CPEP
30.0000 mg | ORAL_CAPSULE | Freq: Two times a day (BID) | ORAL | Status: DC
  Administered 2021-08-02 – 2021-08-07 (×9): 30 mg via ORAL
  Filled 2021-08-02 (×9): qty 1

## 2021-08-02 MED ORDER — SIMVASTATIN 20 MG PO TABS
40.0000 mg | ORAL_TABLET | Freq: Every day | ORAL | Status: DC
  Administered 2021-08-02 – 2021-08-07 (×6): 40 mg via ORAL
  Filled 2021-08-02 (×6): qty 2

## 2021-08-02 MED ORDER — GABAPENTIN 300 MG PO CAPS
600.0000 mg | ORAL_CAPSULE | Freq: Every day | ORAL | Status: DC
  Administered 2021-08-02: 600 mg via ORAL
  Filled 2021-08-02: qty 2

## 2021-08-02 MED ORDER — POTASSIUM CHLORIDE CRYS ER 20 MEQ PO TBCR: 40.0000 meq | EXTENDED_RELEASE_TABLET | ORAL | Status: DC

## 2021-08-02 MED ORDER — AMLODIPINE BESYLATE 10 MG PO TABS
10.0000 mg | ORAL_TABLET | Freq: Every day | ORAL | Status: DC
  Administered 2021-08-03 – 2021-08-07 (×5): 10 mg via ORAL
  Filled 2021-08-02 (×6): qty 1

## 2021-08-02 MED ORDER — GABAPENTIN 600 MG PO TABS: 1200.0000 mg | ORAL_TABLET | Freq: Every day | ORAL | Status: DC

## 2021-08-02 MED ORDER — DONEPEZIL HCL 5 MG PO TABS
5.0000 mg | ORAL_TABLET | Freq: Every day | ORAL | Status: DC
  Administered 2021-08-02 – 2021-08-06 (×5): 5 mg via ORAL
  Filled 2021-08-02 (×5): qty 1

## 2021-08-02 MED ORDER — ONDANSETRON HCL 4 MG PO TABS: 4.0000 mg | ORAL_TABLET | Freq: Four times a day (QID) | ORAL | Status: DC | PRN

## 2021-08-02 MED ORDER — POTASSIUM CHLORIDE 10 MEQ/100ML IV SOLN
10.0000 meq | INTRAVENOUS | Status: AC
  Administered 2021-08-02 (×4): 10 meq via INTRAVENOUS
  Filled 2021-08-02 (×3): qty 100

## 2021-08-02 MED ORDER — AMLODIPINE BESYLATE 5 MG PO TABS
5.0000 mg | ORAL_TABLET | Freq: Every day | ORAL | Status: DC
  Administered 2021-08-02: 5 mg via ORAL
  Filled 2021-08-02: qty 1

## 2021-08-02 MED ORDER — ACETAMINOPHEN 650 MG RE SUPP: 650.0000 mg | Freq: Four times a day (QID) | RECTAL | Status: DC | PRN

## 2021-08-02 MED ORDER — POLYETHYLENE GLYCOL 3350 17 G PO PACK: 17.0000 g | PACK | Freq: Every day | ORAL | Status: DC | PRN

## 2021-08-02 MED ORDER — ENOXAPARIN SODIUM 30 MG/0.3ML IJ SOSY
30.0000 mg | PREFILLED_SYRINGE | INTRAMUSCULAR | Status: DC
  Administered 2021-08-02 – 2021-08-06 (×5): 30 mg via SUBCUTANEOUS
  Filled 2021-08-02 (×5): qty 0.3

## 2021-08-02 MED ORDER — GABAPENTIN 600 MG PO TABS
1200.0000 mg | ORAL_TABLET | Freq: Two times a day (BID) | ORAL | Status: DC
  Administered 2021-08-02: 1200 mg via ORAL
  Filled 2021-08-02: qty 2

## 2021-08-02 MED ORDER — ACETAMINOPHEN 325 MG PO TABS: 650.0000 mg | ORAL_TABLET | Freq: Four times a day (QID) | ORAL | Status: DC | PRN

## 2021-08-02 MED ORDER — TAMSULOSIN HCL 0.4 MG PO CAPS
0.4000 mg | ORAL_CAPSULE | Freq: Every day | ORAL | Status: DC
  Administered 2021-08-02 – 2021-08-07 (×4): 0.4 mg via ORAL
  Filled 2021-08-02 (×4): qty 1

## 2021-08-02 MED ORDER — HYDRALAZINE HCL 20 MG/ML IJ SOLN: 10.0000 mg | Freq: Four times a day (QID) | INTRAMUSCULAR | Status: DC | PRN

## 2021-08-02 MED ORDER — POTASSIUM CHLORIDE CRYS ER 20 MEQ PO TBCR
40.0000 meq | EXTENDED_RELEASE_TABLET | ORAL | Status: DC
Start: 2021-08-02 — End: 2021-08-02

## 2021-08-02 NOTE — Progress Notes (Signed)
Pt admitted to 3e24 in NAD. VSS on arrival to room. Pt opens eyes to voice but does not respond verbally. Admission documentation incomplete due to nonverbal patient and a lack of family at bedside. Falls precautions in place.

## 2021-08-02 NOTE — Evaluation (Signed)
Occupational Therapy Evaluation Patient Details Name: Casey Fisher MRN: [id number] DOB: [birth year] Today's Date: 08/02/2021    History of Present Illness Pt is a 87 y.o. male who presented 08/01/21 with decreased oral intake and difficulty swallowing. Pt with hypernatremia, hypokalemia, and AKI. No evidence of acute intracranial abnormality. PMH: prostate cancer, parkinsonism/neuromuscular disorder/memory difficulty, peripheral neuropathy, cataract, depression, heart murmur, HTN   Clinical Impression   Pt admitted with due to above. Per daughter reported they have assistance in home with family but has been becoming more difficult. Pt was having increase in assist for all ADLS with doing hygiene at bed level and mostly just pivot transfers. Pt in session required max x2 for all bed mobility and  pivot transfer to chair. It is recommended they go to SNF but if they decline they would be recommended to have maximum HH level of support with a hospital bed and mechanical lift due to caregiver burden.  Pt currently with functional limitations due to the deficits listed below (see OT Problem List).  Pt will benefit from skilled OT to increase their safety and independence with ADL and functional mobility for ADL to facilitate discharge to venue listed below.      Follow Up Recommendations  SNF;Supervision/Assistance - 24 hour;Other (comment) (Per daughter is not sure the pt sill agree to SNF stay if they do not agree they need [facility] care with aide)    Equipment Recommendations  Hospital bed (lift)    Recommendations for Other Services       Precautions / Restrictions Precautions Precautions: Fall Precaution Comments: HOH Restrictions Weight Bearing Restrictions: No      Mobility Bed Mobility Overal bed mobility: Needs Assistance Bed Mobility: Rolling;Supine to Sit Rolling: Max assist   Supine to sit: Max assist;+2 for physical assistance;HOB elevated     General bed mobility comments:  Increased time and tactile cues with physical assistance to initiate leg movement towards and off EOB. Hand-held assist to pull trunk up to sit EOB and scoot hips to square with EOB, maxAx2. MaxA to roll either direction with chair reclined for pericare.    Transfers Overall transfer level: Needs assistance Equipment used: 2 person hand held assist Transfers: Sit to/from [employer] Sit to Stand: Max assist;+2 physical assistance   Squat pivot transfers: Max assist;+2 physical assistance;From elevated surface     General transfer comment: Squat pivot to R from elevated EOB > recliner with bil HHA, cuing pt to lean anteriorly and extend legs, but unable to come to full stand so pivoted over in squat position, maxAx2. Additional attempt to stand from recliner with maxAx2 and bil knee block and asisstance at hips to extend, but still unable to extend hips enough to stand upright fully.    Balance Overall balance assessment: Needs assistance Sitting-balance support: Bilateral upper extremity supported;Feet supported Sitting balance-Leahy Scale: Poor Sitting balance - Comments: Min-modA posteriorly for support sitting statically EOB with UE support, displaying posterior and L lateral lean. Postural control: Posterior lean;Left lateral lean Standing balance support: Bilateral upper extremity supported Standing balance-Leahy Scale: Zero Standing balance comment: MaxAx2 and UE support for semi-stand.                           ADL either performed or assessed with clinical judgement   ADL Overall ADL's : Needs assistance/impaired Eating/Feeding: Minimal assistance;Sitting   Grooming: Wash/dry hands;Wash/dry face;Sitting;Moderate assistance   Upper Body Bathing: Moderate assistance;Sitting;Cueing for safety;Cueing for sequencing  Lower Body Bathing: Maximal assistance;+2 for physical assistance;+2 for safety/equipment;Cueing for safety;Cueing for sequencing;Bed  level   Upper Body Dressing : Moderate assistance;Cueing for safety;Cueing for sequencing;Sitting   Lower Body Dressing: Maximal assistance;+2 for physical assistance;+2 for safety/equipment   Toilet Transfer: Maximal assistance;+2 for physical assistance;+2 for safety/equipment;Squat-pivot;Cueing for safety;Cueing for sequencing   Toileting- Clothing Manipulation and Hygiene: +2 for physical assistance;+2 for safety/equipment;Total assistance               Vision         Perception     Praxis      Pertinent Vitals/Pain Pain Assessment: No/denies pain Faces Pain Scale: No hurt     Hand Dominance Right   Extremity/Trunk Assessment Upper Extremity Assessment Upper Extremity Assessment: Generalized weakness   Lower Extremity Assessment Lower Extremity Assessment: Generalized weakness   Cervical / Trunk Assessment Cervical / Trunk Assessment: Kyphotic   Communication Communication Communication: HOH   Cognition Arousal/Alertness: Lethargic Behavior During Therapy: Flat affect Overall Cognitive Status: History of cognitive impairments - at baseline                                 General Comments: Daughter reports pt is aware of person and location at baseline. Pt slow to process cues and initiate. Poor sequencing and safety awareness. No awareness that he had a bowel movement.   General Comments       Exercises Exercises: Other exercises Other Exercises Other Exercises: PROM to bil knees into extension with combination of ankle dorsiflexion   Shoulder Instructions      Home Living Family/patient expects to be discharged to:: Private residence Living Arrangements: Other relatives Available Help at Discharge: Family;Available 24 hours/day Type of Home: House Home Access: Stairs to enter [employer] of Steps: 2         Bathroom Shower/Tub: Tub/shower unit         Home Equipment: Environmental consultant - 2 wheels;Walker - standard;Bedside  commode;Wheelchair - manual          Prior Functioning/Environment Level of Independence: Needs assistance  Gait / Transfers Assistance Needed: Has been having more difficulty walking lately, used to walk household distances with a shuffling gait pattern using a RW with assistance. Needs assistance for bed mobility and transfers, +1. ADL's / Homemaking Assistance Needed: Needs assistance for all ADLs.            OT Problem List: Decreased strength;Decreased range of motion;Decreased activity tolerance;Impaired balance (sitting and/or standing);Decreased cognition;Decreased safety awareness;Decreased knowledge of use of DME or AE      OT Treatment/Interventions: Self-care/ADL training;Therapeutic exercise;DME and/or AE instruction;Therapeutic activities;Patient/family education;Balance training    OT Goals(Current goals can be found in the care plan section) Acute Rehab OT Goals Patient Stated Goal: agreeable to session; daughter desires for pt to improve to decrease burden of care OT Goal Formulation: With patient/family Time For Goal Achievement: 08/16/21 Potential to Achieve Goals: Good ADL Goals Pt Will Perform Grooming: with supervision;bed level Pt Will Perform Upper Body Bathing: with min assist;sitting Pt Will Perform Lower Body Bathing: with max assist;sitting/lateral leans Pt Will Transfer to Toilet: with mod assist;with +2 assist;bedside commode  OT Frequency: Min 2X/week   Barriers to D/C:            Co-evaluation PT/OT/SLP Co-Evaluation/Treatment: Yes Reason for Co-Treatment: Necessary to address cognition/behavior during functional activity PT goals addressed during session: Mobility/safety with mobility;Balance;Strengthening/ROM OT goals addressed during session:  ADL's and self-care      AM-PAC OT "6 Clicks" Daily Activity     Outcome Measure Help from another person eating meals?: A Little Help from another person taking care of personal grooming?: A  Lot Help from another person toileting, which includes using toliet, bedpan, or urinal?: Total Help from another person bathing (including washing, rinsing, drying)?: A Lot Help from another person to put on and taking off regular upper body clothing?: A Lot Help from another person to put on and taking off regular lower body clothing?: Total 6 Click Score: 11   End of Session Equipment Utilized During Treatment: Gait belt Nurse Communication: Mobility status;Need for lift equipment  Activity Tolerance: Patient limited by fatigue Patient left: in chair;with call bell/phone within reach;with chair alarm set  OT Visit Diagnosis: Unsteadiness on feet (R26.81);Other abnormalities of gait and mobility (R26.89);Muscle weakness (generalized) (M62.81)                Time: 1017-1100 OT Time Calculation (min): 43 min Charges:  OT General Charges $OT Visit: 1 Visit OT Evaluation $OT Eval Low Complexity: 1 Low OT Treatments $Self Care/Home Management : 8-22 mins  Joeseph Amor OTR/L  Acute Rehab Services  [phone] office number [phone] pager number   Joeseph Amor 08/02/2021, 12:40 PM

## 2021-08-02 NOTE — Evaluation (Signed)
Physical Therapy Evaluation Patient Details Name: Casey Fisher MRN: [id number] DOB: 10-20-1928 Today's Date: 08/02/2021   History of Present Illness  Pt is a 87 y.o. male who presented 08/01/21 with decreased oral intake and difficulty swallowing. Pt with hypernatremia, hypokalemia, and AKI. No evidence of acute intracranial abnormality. PMH: prostate cancer, parkinsonism/neuromuscular disorder/memory difficulty, peripheral neuropathy, cataract, depression, heart murmur, HTN   Clinical Impression  Pt presents with condition above and deficits mentioned below, see PT Problem List. PTA, he was living with relatives in a house. His daughter reports that he has been having increasing difficulty with ambulating as he used to be able to walk household distances with a RW with assistance but has not been walking as much recently. Currently, pt displays deficits in overall strength, balance, lower extremity ROM, coordination, cognition, and activity tolerance that impact his safety and independence with all functional mobility. He is at high risk for falls. Pt is requiring maxAx2 for all bed mobility and transfers this date, unable to achieve a full upright standing position to take steps today. Recommend short-term rehab at a SNF to decrease burden of care and improve functional mobility independence and safety prior to return home. If they decide for pt to return home then he will need max [facility] 2 services and a hospital bed and mechanical lift. Will continue to follow acutely. Educated pt's daughter on resources and support groups for caregiver burden.    Follow Up Recommendations SNF;Supervision/Assistance - 24 hour (if go home then max [facility] services)    Equipment Recommendations  Hospital bed;Other (comment) (mechanical lift)    Recommendations for Other Services       Precautions / Restrictions Precautions Precautions: Fall Precaution Comments: HOH Restrictions Weight Bearing Restrictions: No       Mobility  Bed Mobility Overal bed mobility: Needs Assistance Bed Mobility: Rolling;Supine to Sit Rolling: Max assist   Supine to sit: Max assist;+2 for physical assistance;HOB elevated     General bed mobility comments: Increased time and tactile cues with physical assistance to initiate leg movement towards and off EOB. Hand-held assist to pull trunk up to sit EOB and scoot hips to square with EOB, maxAx2. MaxA to roll either direction with chair reclined for pericare.    Transfers Overall transfer level: Needs assistance Equipment used: 2 person hand held assist Transfers: Sit to/from [employer] Sit to Stand: Max assist;+2 physical assistance   Squat pivot transfers: Max assist;+2 physical assistance;From elevated surface     General transfer comment: Squat pivot to R from elevated EOB > recliner with bil HHA, cuing pt to lean anteriorly and extend legs, but unable to come to full stand so pivoted over in squat position, maxAx2. Additional attempt to stand from recliner with maxAx2 and bil knee block and asisstance at hips to extend, but still unable to extend hips enough to stand upright fully.  Ambulation/Gait             General Gait Details: Unable at this time.  Stairs            Wheelchair Mobility    Modified Rankin (Stroke Patients Only) Modified Rankin (Stroke Patients Only) Pre-Morbid Rankin Score: Moderately severe disability Modified Rankin: Severe disability     Balance Overall balance assessment: Needs assistance Sitting-balance support: Bilateral upper extremity supported;Feet supported Sitting balance-Leahy Scale: Poor Sitting balance - Comments: Min-modA posteriorly for support sitting statically EOB with UE support, displaying posterior and L lateral lean. Postural control: Posterior lean;Left lateral lean Standing  balance support: Bilateral upper extremity supported Standing balance-Leahy Scale: Zero Standing balance  comment: MaxAx2 and UE support for semi-stand.                             Pertinent Vitals/Pain Pain Assessment: No/denies pain Faces Pain Scale: No hurt    Home Living Family/patient expects to be discharged to:: Private residence Living Arrangements: Other relatives Available Help at Discharge: Family;Available 24 hours/day Type of Home: House Home Access: Stairs to enter   [employer] of Steps: 2 (gets medical transportation to enter/exit home`)   Home Equipment: Walker - 2 wheels;Walker - standard;Bedside commode;Wheelchair - manual      Prior Function Level of Independence: Needs assistance   Gait / Transfers Assistance Needed: Has been having more difficulty walking lately, used to walk household distances with a shuffling gait pattern using a RW with assistance. Needs assistance for bed mobility and transfers, +1.  ADL's / Homemaking Assistance Needed: Needs assistance for all ADLs.        Hand Dominance   Dominant Hand: Right    Extremity/Trunk Assessment   Upper Extremity Assessment Upper Extremity Assessment: Defer to OT evaluation    Lower Extremity Assessment Lower Extremity Assessment: Generalized weakness (decreased bil hip and knee extension ROM)    Cervical / Trunk Assessment Cervical / Trunk Assessment: Kyphotic  Communication   Communication: HOH  Cognition Arousal/Alertness: Lethargic Behavior During Therapy: Flat affect Overall Cognitive Status: History of cognitive impairments - at baseline                                 General Comments: Daughter reports pt is aware of person and location at baseline. Pt slow to process cues and initiate. Poor sequencing and safety awareness. No awareness that he had a bowel movement.      General Comments      Exercises Other Exercises Other Exercises: PROM to bil knees into extension with combination of ankle dorsiflexion   Assessment/Plan    PT Assessment  Patient needs continued PT services  PT Problem List Decreased strength;Decreased range of motion;Decreased activity tolerance;Decreased mobility;Decreased balance;Decreased coordination;Decreased cognition;Decreased knowledge of use of DME;Decreased safety awareness       PT Treatment Interventions DME instruction;Gait training;Functional mobility training;Therapeutic activities;Therapeutic exercise;Balance training;Neuromuscular re-education;Cognitive remediation;Patient/family education;Wheelchair mobility training    PT Goals (Current goals can be found in the Care Plan section)  Acute Rehab PT Goals Patient Stated Goal: agreeable to session; daughter desires for pt to improve to decrease burden of care PT Goal Formulation: With patient/family Time For Goal Achievement: 08/16/21 Potential to Achieve Goals: Fair    Frequency Min 2X/week   Barriers to discharge        Co-evaluation PT/OT/SLP Co-Evaluation/Treatment: Yes Reason for Co-Treatment: Necessary to address cognition/behavior during functional activity;For patient/therapist safety;To address functional/ADL transfers PT goals addressed during session: Mobility/safety with mobility;Balance;Strengthening/ROM         AM-PAC PT "6 Clicks" Mobility  Outcome Measure Help needed turning from your back to your side while in a flat bed without using bedrails?: A Lot Help needed moving from lying on your back to sitting on the side of a flat bed without using bedrails?: Total Help needed moving to and from a bed to a chair (including a wheelchair)?: Total Help needed standing up from a chair using your arms (e.g., wheelchair or bedside chair)?: Total Help  needed to walk in hospital room?: Total Help needed climbing 3-5 steps with a railing? : Total 6 Click Score: 7    End of Session Equipment Utilized During Treatment: Gait belt Activity Tolerance: Patient tolerated treatment well Patient left: in chair;with call bell/phone  within reach;with chair alarm set Nurse Communication: Mobility status;Need for lift equipment PT Visit Diagnosis: Unsteadiness on feet (R26.81);Muscle weakness (generalized) (M62.81);Difficulty in walking, not elsewhere classified (R26.2);History of falling (Z91.81);Other symptoms and signs involving the nervous system (R29.898)    Time: 1013-1101 PT Time Calculation (min) (ACUTE ONLY): 48 min   Charges:   PT Evaluation $PT Eval Moderate Complexity: 1 Mod PT Treatments $Therapeutic Activity: 8-22 mins        Moishe Spice, PT, DPT Acute Rehabilitation Services  Pager: [phone] Office: [place] 08/02/2021, 12:09 PM

## 2021-08-02 NOTE — TOC Initial Note (Signed)
Transition of Care [facility]) - Initial/Assessment Note    Patient Details  Name: Casey Fisher MRN: [id number] Date of Birth: 16-Jan-1928  Transition of Care [facility]) CM/SW Contact:    Bary Castilla, LCSW Phone Number:[phone] 08/02/2021, 4:25 PM  Clinical Narrative:                  CSW spoke with pt's daughter [name] due to pt's only being oriented to self to discuss PT recommendation of a SNF. [name] was aware of recommendation and in agreement with pt going to a ST SNF. CSW discussed the SNF process.CSW provided patient with medicare.gov rating list.  [name] gave CSW permission to fax referrals out to local facilities.CSW answered questions about the SNF process and the next steps in the process.  Pt has been vaccinated and has had his booster.  TOC team will continue to assist with discharge planning needs.       Expected Discharge Plan: Skilled Nursing Facility Barriers to Discharge: Continued Medical Work up, SNF Pending bed offer   Patient Goals and CMS Choice   CMS Medicare.gov Compare Post Acute Care list provided to:: Patient Represenative (must comment) Choice offered to / list presented to : Adult Children  Expected Discharge Plan and Services Expected Discharge Plan: [place] arrangements for the past 2 months: Single Family Home                                      Prior Living Arrangements/Services Living arrangements for the past 2 months: Single Family Home Lives with:: Self, Relatives (nephrew lives with pt) Patient language and need for interpreter reviewed:: Yes          Care giver support system in place?: Yes (comment)      Activities of Daily Living      Permission Sought/Granted      Share Information with NAME: [name]  Permission granted to share info w AGENCY: SNFs  Permission granted to share info w Relationship: Daughter  Permission granted to share info w Contact Information:  [id number]  Emotional Assessment Appearance:: Other (Comment Required (unable to access) Attitude/Demeanor/Rapport: Unable to Assess Affect (typically observed): Unable to Assess Orientation: : Oriented to Self      Admission diagnosis:  Dehydration [E86.0] Hypernatremia [E87.0] Weakness [R53.1] AKI (acute kidney injury) ([place]) [N17.9] Patient Active Problem List   Diagnosis Date Noted   Hypernatremia 08/01/2021   AKI (acute kidney injury) ([place]) 08/01/2021   Decreased oral intake 08/01/2021   Dehydration 08/01/2021   Parkinsonism ([place]) 08/01/2021   Parkinson's disease ([place]) 05/07/2021   Primary osteoarthritis of right knee 05/23/2020   Low back pain 08/03/2019   Acute pain of left shoulder 10/10/2018   Chest pain 11/08/2017   Memory difficulty 03/24/2016   Hereditary and idiopathic peripheral neuropathy 03/24/2016   Abnormality of gait 03/24/2016   Peripheral neuropathy 04/18/2015   Hyperlipidemia 04/18/2015   Prostate cancer ([place]) 08/24/2014   Right inguinal hernia 10/30/2013   PCP:  [name], MD Pharmacy:   [facility] [place], [place] - [place] [address] Phone: [phone] Fax: [phone]     Social Determinants of Health (SDOH) Interventions    Readmission Risk Interventions No flowsheet data found.

## 2021-08-02 NOTE — Evaluation (Signed)
Clinical/Bedside Swallow Evaluation Patient Details  Name: Casey Fisher MRN: [id number] Date of Birth: 1928/01/30  Today's Date: 08/02/2021 Time: SLP Start Time (ACUTE ONLY): 1104 SLP Stop Time (ACUTE ONLY): [id number] SLP Time Calculation (min) (ACUTE ONLY): 24 min  Past Medical History:  Past Medical History:  Diagnosis Date   Abnormality of gait 03/24/2016   Anxiety    Cancer ([place])    Cataract    Depression    Heart murmur    Hereditary and idiopathic peripheral neuropathy 03/24/2016   Hypertension    Memory difficulty 03/24/2016   Neuromuscular disorder ([place])    Neuropathy    Parkinson's disease ([place]) 05/07/2021   Prostate cancer ([place])    remission   Past Surgical History:  Past Surgical History:  Procedure Laterality Date   CATARACT EXTRACTION Bilateral    EYE SURGERY     HERNIA REPAIR  1985   INGUINAL HERNIA REPAIR Right 12/06/2013   Procedure: HERNIA REPAIR INGUINAL ADULT;  Surgeon: Merrie Roof, MD;  Location: [place];  Service: General;  Laterality: Right;   INSERTION OF MESH Right 12/06/2013   Procedure: INSERTION OF MESH;  Surgeon: Merrie Roof, MD;  Location: [place];  Service: General;  Laterality: Right;   PROSTATE SURGERY     HPI:  Pt is a 87 y.o. male brought to ED by family with reports of decreased responsiveness and poor p.o. intake. Pt has not been swallowing well for about 2 weeks and has gradually worsened to where he takes very small mounts of food. He consumes very soft/liquid foods like applesauce and jello. Labs upon admission revealed pt is hypernatremic. Chest xray (08/01/21) revealed no active disease. CT head (08/01/21) revealed No evidence of acute intracranial abnormality. PMH: prostate cancer, parkinsonism/neuromuscular disorder, memory difficulty, peripheral neuropathy.   Assessment / Plan / Recommendation Clinical Impression  Pt seen for bedside swallow evaluation with daughter present to provide PLOF and recent pt decline in swallow function. She  primarily reports pt difficulty swallowing solids, specifically oral holding. Oral mechanism examination limited due to pt's poor ability to follow commands, however labial and lingual ROM and symmetry were WNL. Pt is also edentulous. Volitional cough was very weak, but spontaneous cough prior to POs was strong. Thin liquids via consecutive straw sips and bites of puree were unremarkable for s/sx of aspiration, however occasional oral holding was observed prior to swallow response. Prolonged mastication, oral holding and mild diffuse oral residuals noted with consumption of regular textured solid which, despite liquid and puree washes, was not fully cleared. Daughter requesting completion of Modified Barium Swallow Study to further assess swallow fucntion. Given recent chest xray, bedside evaluation and family reports, suspect primary oral dysphagia with cognitive component, however instrumental testing can be completed per family request. Notified daughter that SLP will likely f/u early next week in regards to this. Recommend dysphagia 2, thin liquid diet with SLP to f/u for tolerance.  SLP Visit Diagnosis: Dysphagia, unspecified (R13.10)    Aspiration Risk  Mild aspiration risk    Diet Recommendation Dysphagia 2 (Fine chop);Thin liquid   Liquid Administration via: Cup;Straw Medication Administration: Whole meds with puree Supervision: Full supervision/cueing for compensatory strategies;Staff to assist with self feeding Compensations: Minimize environmental distractions;Slow rate;Small sips/bites;Follow solids with liquid Postural Changes: Seated upright at 90 degrees    Other  Recommendations Oral Care Recommendations: Oral care BID;Staff/trained caregiver to provide oral care   Follow up Recommendations Other (comment) (TBD)      Frequency and  Duration min 2x/week  2 weeks       Prognosis Prognosis for Safe Diet Advancement: Fair Barriers to Reach Goals: Cognitive deficits       Swallow Study   General Date of Onset: 08/01/21 HPI: Pt is a 87 y.o. male brought to ED by family with reports of decreased responsiveness and poor p.o. intake. Pt has not been swallowing well for about 2 weeks and has gradually worsened to where he takes very small mounts of food. He consumes very soft/liquid foods like applesauce and jello. Labs upon admission revealed pt is hypernatremic. Chest xray (08/01/21) revealed no active disease. CT head (08/01/21) revealed No evidence of acute intracranial abnormality. PMH: prostate cancer, parkinsonism/neuromuscular disorder, memory difficulty, peripheral neuropathy. Type of Study: Bedside Swallow Evaluation Previous Swallow Assessment: none noted in EMR Diet Prior to this Study: Regular;Thin liquids Temperature Spikes Noted: No Respiratory Status: Room air History of Recent Intubation: No Behavior/Cognition: Alert;Cooperative;Confused;Distractible Oral Cavity Assessment: Within Functional Limits Oral Care Completed by SLP: No Oral Cavity - Dentition: Edentulous Vision: Functional for self-feeding Self-Feeding Abilities: Needs assist Patient Positioning: Upright in chair;Postural control adequate for testing Baseline Vocal Quality: Low vocal intensity Volitional Cough: Weak Volitional Swallow: Unable to elicit    Oral/Motor/Sensory Function Overall Oral Motor/Sensory Function: Other (comment) (unable to fully assess)   Ice Chips Ice chips: Not tested   Thin Liquid Thin Liquid: Within functional limits Presentation: Self Fed;Straw    Nectar Thick Nectar Thick Liquid: Not tested   Honey Thick Honey Thick Liquid: Not tested   Puree Puree: Within functional limits Presentation: Spoon   Solid       Solid: Impaired Oral Phase Impairments: Impaired mastication Oral Phase Functional Implications: Oral residue;Oral holding;Impaired mastication     Ellwood Dense, MA, [place] Office Number: [phone]  Acie Fredrickson 08/02/2021,12:01 PM

## 2021-08-02 NOTE — Progress Notes (Signed)
PROGRESS NOTE    MATHANIEL VIVIRITO  [id number] DOB: 1928-12-19 DOA: 08/01/2021 PCP: [name], MD   Brief Narrative: DELONE HECKLE is a 87 y.o. male with medical history significant of prostate cancer, parkinsonism/neuromuscular disorder/memory difficulty, peripheral neuropathy.  Patient presented secondary to decreased oral intake was found to have severe hypernatremia secondary to dehydration.  Patient was started on IV fluids.  Speech therapy consulted for possible swallow difficulties.   Assessment & Plan:   Principal Problem:   Hypernatremia Active Problems:   Peripheral neuropathy   Hyperlipidemia   Memory difficulty   AKI (acute kidney injury) ([place])   Decreased oral intake   Dehydration   Parkinsonism (HCC)   Hypernatremia No associated hypovolemia; patient is hypertensive. Mental status baseline. Patient given normal saline IV fluids in the ED.  Sodium and chloride of 166/121 respectively on admission. Sodium improved to 159 overnight. -Decrease to D5 water at 50 mL/h  -Check BMP every 8 hours   Concern for failure to thrive Poor oral intake -Dietitian consult -Speech therapy recommendations: Dysphagia 2 diet   AKI Baseline creatinine is about 1.1.  On admission, creatinine of 1.92 in setting of dehydration.  Given 1 L normal saline bolus in the ED. -IV fluids as mentioned above   Hypokalemia Patient given 10 mEq of potassium in the ED. Continued supplementation given. Still hypokalemic -Kdur 40 meq x3   Primary hypertension Patient is on hydrochlorothiazide as an outpatient.  Hold on admission.  Blood pressure is currently uncontrolled. -Increase to amlodipine 10 mg daily   Parkinsonism Neuromuscular disorder Memory difficulty No formal diagnosis of dementia.  Patient is on Aricept and Sinemet as an outpatient follows with Dr. [name], neurology. -Continue Sinemet and Aricept in addition to duloxetine (dose confirmed with daughter); if  renal function worsens, may need to decrease dose of Cymbalta -Daily magnesium, phosphorus, potassium   Peripheral neuropathy Patient is prescribed gabapentin as an outpatient. -Decrease to gabapentin 300 mg TID secondary to renal impairment   Chronic diastolic heart dysfunction Echo from 2018 significant for normal EF and grade 2 diastolic dysfunction.  Patient is currently dehydrated.   Hyperlipidemia On simvastatin as an outpatient but unconfirmed.   Thrombocytopenia Mild. Repeat CBC   History of prostate cancer Currently in remission.  Patient is on tamsulosin, Erleada, Lupron as an outpatient per med history.  -Continue home apalutamide and Flomax   DVT prophylaxis: Lovenox Code Status:   Code Status: DNR Family Communication: Daughter at bedside Disposition Plan: Discharge home vs SNF pending improvement of sodium. Anticipate readiness for discharge in 2-3 days   Consultants:  None  Procedures:  None  Antimicrobials: None    Subjective: Patient does not answer many of my questions  Objective: Vitals:   08/02/21 0049 08/02/21 0433 08/02/21 0756 08/02/21 1142  BP: 107/82 (!) 159/86 (!) 149/86 (!) 156/100  Pulse: 87 72 93 (!) 50  Resp: '16 17 18 18  '$ Temp: 97.9 F (36.6 C) 98.2 F (36.8 C) 97.6 F (36.4 C) 98 F (36.7 C)  TempSrc: Axillary Oral Oral Oral  SpO2: 100% 92% 96% 97%  Weight: 69.9 kg     Height:        Intake/Output Summary (Last 24 hours) at 08/02/2021 1552 Last data filed at 08/01/2021 1847 Gross per 24 hour  Intake 1099 ml  Output 1000 ml  Net 99 ml   Filed Weights   08/01/21 1446 08/02/21 0049  Weight: 75 kg 69.9 kg    Examination:  General exam: Appears calm and comfortable  Respiratory system: Clear to auscultation. Respiratory effort normal. Cardiovascular system: S1 & S2 heard, RRR. No murmurs, rubs, gallops or clicks. Gastrointestinal system: Abdomen is nondistended, soft and nontender. No organomegaly or masses felt. Normal  bowel sounds heard. Central nervous system: Alert. No focal neurological deficits. Musculoskeletal: No edema. No calf tenderness Skin: No cyanosis. No rashes Psychiatry: Blunt affect     Data Reviewed: I have personally reviewed following labs and imaging studies  CBC Lab Results  Component Value Date   WBC 6.4 08/02/2021   RBC 4.78 08/02/2021   HGB 13.6 08/02/2021   HCT 44.1 08/02/2021   MCV 92.3 08/02/2021   MCH 28.5 08/02/2021   PLT 98 (L) 08/02/2021   MCHC 30.8 08/02/2021   RDW 15.8 (H) 08/02/2021   LYMPHSABS 1.0 08/01/2021   MONOABS 0.3 08/01/2021   EOSABS 0.1 08/01/2021   BASOSABS 0.0 [id number]     Last metabolic panel Lab Results  Component Value Date   NA 155 (H) 08/02/2021   K 2.2 (LL) 08/02/2021   CL 115 (H) 08/02/2021   CO2 32 08/02/2021   BUN 18 08/02/2021   CREATININE 1.52 (H) 08/02/2021   GLUCOSE 140 (H) 08/02/2021   GFRNONAA 43 (L) 08/02/2021   GFRAA 65 11/11/2020   CALCIUM 8.6 (L) 08/02/2021   PROT 7.2 08/01/2021   ALBUMIN 3.4 (L) 08/01/2021   LABGLOB 3.2 11/11/2020   AGRATIO 1.2 11/11/2020   BILITOT 1.1 08/01/2021   ALKPHOS 82 08/01/2021   AST 30 08/01/2021   ALT 15 08/01/2021   ANIONGAP 8 08/02/2021    CBG (last 3)  No results for input(s): GLUCAP in the last 72 hours.   GFR: Estimated Creatinine Clearance: 31.3 mL/min (A) (by C-G formula based on SCr of 1.52 mg/dL (H)).  Coagulation Profile: No results for input(s): INR, PROTIME in the last 168 hours.  Recent Results (from the past 240 hour(s))  Resp Panel by RT-PCR (Flu A&B, Covid) Urine, Catheterized     Status: None   Collection Time: 08/01/21  3:37 PM   Specimen: Urine, Catheterized; Nasopharyngeal(NP) swabs in vial transport medium  Result Value Ref Range Status   SARS Coronavirus 2 by RT PCR NEGATIVE NEGATIVE Final    Comment: (NOTE) SARS-CoV-2 target nucleic acids are NOT DETECTED.  The SARS-CoV-2 RNA is generally detectable in upper respiratory specimens during the  acute phase of infection. The lowest concentration of SARS-CoV-2 viral copies this assay can detect is 138 copies/mL. A negative result does not preclude SARS-Cov-2 infection and should not be used as the sole basis for treatment or other patient management decisions. A negative result may occur with  improper specimen collection/handling, submission of specimen other than nasopharyngeal swab, presence of viral mutation(s) within the areas targeted by this assay, and inadequate number of viral copies(<138 copies/mL). A negative result must be combined with clinical observations, patient history, and epidemiological information. The expected result is Negative.  Fact Sheet for Patients:  EntrepreneurPulse.com.au  Fact Sheet for Healthcare Providers:  IncredibleEmployment.be  This test is no t yet approved or cleared by the [place] FDA and  has been authorized for detection and/or diagnosis of SARS-CoV-2 by FDA under an Emergency Use Authorization (EUA). This EUA will remain  in effect (meaning this test can be used) for the duration of the COVID-19 declaration under Section 564(b)(1) of the Act, 21 U.S.C.section 360bbb-3(b)(1), unless the authorization is terminated  or revoked sooner.       Influenza  A by PCR NEGATIVE NEGATIVE Final   Influenza B by PCR NEGATIVE NEGATIVE Final    Comment: (NOTE) The Xpert Xpress SARS-CoV-2/FLU/RSV plus assay is intended as an aid in the diagnosis of influenza from Nasopharyngeal swab specimens and should not be used as a sole basis for treatment. Nasal washings and aspirates are unacceptable for Xpert Xpress SARS-CoV-2/FLU/RSV testing.  Fact Sheet for Patients: EntrepreneurPulse.com.au  Fact Sheet for Healthcare Providers: IncredibleEmployment.be  This test is not yet approved or cleared by the [place] FDA and has been authorized for detection and/or  diagnosis of SARS-CoV-2 by FDA under an Emergency Use Authorization (EUA). This EUA will remain in effect (meaning this test can be used) for the duration of the COVID-19 declaration under Section 564(b)(1) of the Act, 21 U.S.C. section 360bbb-3(b)(1), unless the authorization is terminated or revoked.  Performed at [facility], [place] [address], [place], [place] [address]         Radiology Studies: CT HEAD WO CONTRAST (5MM)  Result Date: 08/01/2021 CLINICAL DATA:  Parkinson's disease.  Generalized weakness. EXAM: CT HEAD WITHOUT CONTRAST TECHNIQUE: Contiguous axial images were obtained from the base of the skull through the vertex without intravenous contrast. COMPARISON:  06/07/2021 FINDINGS: Brain: There is no evidence of an acute infarct, intracranial hemorrhage, mass, midline shift, or extra-axial fluid collection. Confluent hypodensities in the cerebral white matter bilaterally are unchanged and nonspecific but compatible with severe chronic small vessel ischemic disease. A chronic infarct is again noted involving the right basal ganglia and internal capsule. There is mild-to-moderate cerebral atrophy. Vascular: Calcified atherosclerosis at the skull base. No hyperdense vessel. Skull: No fracture or suspicious osseous lesion. Sinuses/Orbits: Paranasal sinuses and mastoid air cells are clear. Bilateral cataract extraction. Other: None. IMPRESSION: 1. No evidence of acute intracranial abnormality. 2. Severe chronic small vessel ischemic disease. Electronically Signed   By: Logan Bores M.D.   On: 08/01/2021 16:29   DG Chest Portable 1 View  Result Date: 08/01/2021 CLINICAL DATA:  Weakness EXAM: PORTABLE CHEST 1 VIEW COMPARISON:  06/07/2021, CT 10/29/2016 FINDINGS: Right lung grossly clear. Hazy atelectasis at the left base. No pleural effusion. Normal cardiac size. No pneumothorax. IMPRESSION: No active disease. Mild chronic elevation left diaphragm with probable scarring and atelectasis  Electronically Signed   By: Donavan Foil M.D.   On: 08/01/2021 16:21        Scheduled Meds:  [START ON 08/03/2021] amLODipine  10 mg Oral Daily   carbidopa-levodopa  1 tablet Oral TID   donepezil  5 mg Oral QHS   DULoxetine  30 mg Oral BID   enoxaparin (LOVENOX) injection  30 mg Subcutaneous Q24H   Continuous Infusions:  dextrose 50 mL/hr at 08/02/21 [id number]     LOS: 1 day     Cordelia Poche, MD [facility] 08/02/2021, 3:52 PM  If 7PM-7AM, please contact night-coverage [email or web address]

## 2021-08-03 DIAGNOSIS — Z515 Encounter for palliative care: Secondary | ICD-10-CM

## 2021-08-03 DIAGNOSIS — Z66 Do not resuscitate: Secondary | ICD-10-CM

## 2021-08-03 DIAGNOSIS — Z7189 Other specified counseling: Secondary | ICD-10-CM

## 2021-08-03 LAB — CBC
HCT: 40.7 % (ref 39.0–52.0)
Hemoglobin: 12.4 g/dL — ABNORMAL LOW (ref 13.0–17.0)
MCH: 28.2 pg (ref 26.0–34.0)
MCHC: 30.5 g/dL (ref 30.0–36.0)
MCV: 92.7 fL (ref 80.0–100.0)
Platelets: 96 10*3/uL — ABNORMAL LOW (ref 150–400)
RBC: 4.39 MIL/uL (ref 4.22–5.81)
RDW: 15.9 % — ABNORMAL HIGH (ref 11.5–15.5)
WBC: 5.1 10*3/uL (ref 4.0–10.5)
nRBC: 0 % (ref 0.0–0.2)

## 2021-08-03 LAB — BASIC METABOLIC PANEL
Anion gap: 9 (ref 5–15)
BUN: 14 mg/dL (ref 8–23)
CO2: 29 mmol/L (ref 22–32)
Calcium: 8.1 mg/dL — ABNORMAL LOW (ref 8.9–10.3)
Chloride: 116 mmol/L — ABNORMAL HIGH (ref 98–111)
Creatinine, Ser: 1.34 mg/dL — ABNORMAL HIGH (ref 0.61–1.24)
GFR, Estimated: 50 mL/min — ABNORMAL LOW (ref >60)
Glucose, Bld: 132 mg/dL — ABNORMAL HIGH (ref 70–99)
Potassium: 2.9 mmol/L — ABNORMAL LOW (ref 3.5–5.1)
Sodium: 154 mmol/L — ABNORMAL HIGH (ref 135–145)

## 2021-08-03 LAB — PHOSPHORUS: Phosphorus: 2.8 mg/dL (ref 2.5–4.6)

## 2021-08-03 LAB — MAGNESIUM: Magnesium: 2.3 mg/dL (ref 1.7–2.4)

## 2021-08-03 MED ORDER — GABAPENTIN 300 MG PO CAPS
300.0000 mg | ORAL_CAPSULE | Freq: Three times a day (TID) | ORAL | Status: DC
  Administered 2021-08-03 – 2021-08-05 (×7): 300 mg via ORAL
  Filled 2021-08-03 (×7): qty 1

## 2021-08-03 MED ORDER — POTASSIUM CHLORIDE 10 MEQ/100ML IV SOLN
10.0000 meq | INTRAVENOUS | Status: AC
  Administered 2021-08-03 (×6): 10 meq via INTRAVENOUS
  Filled 2021-08-03 (×6): qty 100

## 2021-08-03 NOTE — Plan of Care (Signed)

## 2021-08-03 NOTE — Plan of Care (Signed)

## 2021-08-03 NOTE — NC FL2 (Addendum)
[place] LEVEL OF CARE SCREENING TOOL     IDENTIFICATION  Patient Name: Casey Fisher Birthdate: 1928-12-12 Sex: male Admission Date (Current Location): 08/01/2021  [facility] and [place] Number:  Herbalist and Address:  [facility], [place] [address] [place], [place] [address]      Provider Number: [id number]  Attending Physician Name and Address:  Mariel Aloe, MD  Relative Name and Phone Number:  [name] [phone]    Current Level of Care: Hospital Recommended Level of Care: [place] Prior Approval Number:    Date Approved/Denied:   PASRR Number:  [id number]  Discharge Plan: SNF    Current Diagnoses: Patient Active Problem List   Diagnosis Date Noted   Hypernatremia 08/01/2021   AKI (acute kidney injury) ([place]) 08/01/2021   Decreased oral intake 08/01/2021   Dehydration 08/01/2021   Parkinsonism ([place]) 08/01/2021   Parkinson's disease ([place]) 05/07/2021   Primary osteoarthritis of right knee 05/23/2020   Low back pain 08/03/2019   Acute pain of left shoulder 10/10/2018   Chest pain 11/08/2017   Memory difficulty 03/24/2016   Hereditary and idiopathic peripheral neuropathy 03/24/2016   Abnormality of gait 03/24/2016   Peripheral neuropathy 04/18/2015   Hyperlipidemia 04/18/2015   Prostate cancer ([place]) 08/24/2014   Right inguinal hernia 10/30/2013    Orientation RESPIRATION BLADDER Height & Weight     Self  Normal Incontinent, External catheter Weight: 153 lb (69.4 kg) Height:  '6\' 2"'$  (188 cm)  BEHAVIORAL SYMPTOMS/MOOD NEUROLOGICAL BOWEL NUTRITION STATUS      Incontinent Diet (See discharge summary)  AMBULATORY STATUS COMMUNICATION OF NEEDS Skin   Extensive Assist Verbally Skin abrasions (Skin abrasions on knee, dry, flaky, itching)                       Personal Care Assistance Level of Assistance  Bathing, Feeding, Dressing, Total care Bathing Assistance: Limited  assistance Feeding assistance: Limited assistance Dressing Assistance: Limited assistance     Functional Limitations Info  Sight, Hearing, Speech Sight Info: Impaired Hearing Info: Adequate Speech Info: Impaired    SPECIAL CARE FACTORS FREQUENCY  PT (By licensed PT), OT (By licensed OT), Speech therapy     PT Frequency: 5x per week OT Frequency: 5x per week     Speech Therapy Frequency: 2x per week      Contractures Contractures Info: Present    Additional Factors Info  Code Status, Allergies Code Status Info: DNR Allergies Info: Lyrica (Pregabalin)           Current Medications (08/03/2021):  This is the current hospital active medication list Current Facility-Administered Medications  Medication Dose Route Frequency Provider Last Rate Last Admin   acetaminophen (TYLENOL) tablet 650 mg  650 mg Oral Q6H PRN Mariel Aloe, MD       Or   acetaminophen (TYLENOL) suppository 650 mg  650 mg Rectal Q6H PRN Mariel Aloe, MD       amLODipine (NORVASC) tablet 10 mg  10 mg Oral Daily Mariel Aloe, MD       apalutamide (ERLEADA) tablet 120 mg  120 mg Oral Daily Mariel Aloe, MD       carbidopa-levodopa (SINEMET IR) 25-250 MG per tablet immediate release 1 tablet  1 tablet Oral TID Mariel Aloe, MD   1 tablet at 08/02/21 2042   dextrose 5 % solution   Intravenous Continuous Mariel Aloe, MD 50  mL/hr at 08/03/21 0455 New Bag at 08/03/21 0455   donepezil (ARICEPT) tablet 5 mg  5 mg Oral QHS Mariel Aloe, MD   5 mg at 08/02/21 2040   DULoxetine (CYMBALTA) DR capsule 30 mg  30 mg Oral BID Mariel Aloe, MD   30 mg at 08/02/21 2040   enoxaparin (LOVENOX) injection 30 mg  30 mg Subcutaneous Q24H Mariel Aloe, MD   30 mg at 08/03/21 0454   gabapentin (NEURONTIN) capsule 600 mg  600 mg Oral QHS Mariel Aloe, MD   600 mg at 08/02/21 2040   gabapentin (NEURONTIN) tablet 1,200 mg  1,200 mg Oral BID Donnamae Jude, RPH   1,200 mg at 08/02/21 1704   hydrALAZINE  (APRESOLINE) injection 10 mg  10 mg Intravenous Q6H PRN Mariel Aloe, MD       ondansetron (ZOFRAN) tablet 4 mg  4 mg Oral Q6H PRN Mariel Aloe, MD       Or   ondansetron (ZOFRAN) injection 4 mg  4 mg Intravenous Q6H PRN Mariel Aloe, MD       polyethylene glycol (MIRALAX / GLYCOLAX) packet 17 g  17 g Oral Daily PRN Mariel Aloe, MD       simvastatin (ZOCOR) tablet 40 mg  40 mg Oral Daily Mariel Aloe, MD   40 mg at 08/02/21 1704   tamsulosin (FLOMAX) capsule 0.4 mg  0.4 mg Oral Daily Mariel Aloe, MD   0.4 mg at 08/02/21 1704     Discharge Medications: Please see discharge summary for a list of discharge medications.  Relevant Imaging Results:  Relevant Lab Results:   Additional Information SSN# [id number] 2017 pt has been vaccinated and boostered  Bary Castilla, LCSW

## 2021-08-03 NOTE — Progress Notes (Signed)
PROGRESS NOTE    Casey Fisher  [id number] DOB: 1928-02-07 DOA: 08/01/2021 PCP: [name], MD   Brief Narrative: Casey Fisher is a 87 y.o. male with medical history significant of prostate cancer, parkinsonism/neuromuscular disorder/memory difficulty, peripheral neuropathy.  Patient presented secondary to decreased oral intake was found to have severe hypernatremia secondary to dehydration.  Patient was started on IV fluids.  Speech therapy consulted for possible swallow difficulties.   Assessment & Plan:   Principal Problem:   Hypernatremia Active Problems:   Peripheral neuropathy   Hyperlipidemia   Memory difficulty   AKI (acute kidney injury) ([place])   Decreased oral intake   Dehydration   Parkinsonism (HCC)   Hypernatremia No associated hypovolemia; patient is hypertensive. Mental status baseline. Patient given normal saline IV fluids in the ED.  Sodium and chloride of 166/121 respectively on admission. Rate of improvement has slow with a sodium of 154 today. -Increase back to D5 water at 75 mL/h  -Check BMP every 8 hours   Concern for failure to thrive Poor oral intake -Dietitian consult pending -Speech therapy recommendations: Dysphagia 2 diet   AKI Baseline creatinine is about 1.1.  On admission, creatinine of 1.92 in setting of dehydration.  Given 1 L normal saline bolus in the ED. Improving. -IV fluids as mentioned above   Hypokalemia Patient given 10 mEq of potassium in the ED. Continued supplementation given. Persistent. He has received 170 meq of potassium IV. No associated acidosis so RTA unlikely. No associated hypotension or hyponatremia. It is possible this could be related to refeeding however phosphorus and magnesium are normal -Kdur 40 meq x6 -Urine sodium/potassium/creatinine/osmolality   Primary hypertension Patient is on hydrochlorothiazide as an outpatient.  Hold on admission.  Blood pressure is currently  uncontrolled. -Continue amlodipine 10 mg daily   Parkinsonism Neuromuscular disorder Memory difficulty No formal diagnosis of dementia.  Patient is on Aricept and Sinemet as an outpatient follows with Dr. [name], neurology. -Continue Sinemet and Aricept in addition to duloxetine -Daily magnesium, phosphorus, potassium -Palliative care consult   Peripheral neuropathy Patient is prescribed gabapentin as an outpatient. -Continue gabapentin 300 mg TID secondary to renal impairment   Chronic diastolic heart dysfunction Echo from 2018 significant for normal EF and grade 2 diastolic dysfunction.  Patient is currently dehydrated.   Hyperlipidemia On simvastatin as an outpatient but unconfirmed.   Thrombocytopenia Mild. Repeat CBC   History of prostate cancer Currently in remission.  Patient is on tamsulosin, Erleada, Lupron as an outpatient per med history.  -Continue home apalutamide and Flomax   DVT prophylaxis: Lovenox Code Status:   Code Status: DNR Family Communication: Daughter at bedside Disposition Plan: Discharge SNF pending improvement of sodium and potassium. Anticipate readiness for discharge in 2-3 days   Consultants:  Palliative care  Procedures:  None  Antimicrobials: None    Subjective: Patient not answering most questions today.  Objective: Vitals:   08/03/21 0800 08/03/21 0819 08/03/21 0820 08/03/21 0825  BP:    (!) 132/99  Pulse:    92  Resp: (!) 0 '15 12 10  '$ Temp:    98.2 F (36.8 C)  TempSrc:      SpO2: 100%     Weight:      Height:        Intake/Output Summary (Last 24 hours) at 08/03/2021 0933 Last data filed at 08/03/2021 0900 Gross per 24 hour  Intake 160 ml  Output --  Net 160 ml    [employer]  08/01/21 1446 08/02/21 0049 08/03/21 0048  Weight: 75 kg 69.9 kg 69.4 kg    Examination:  General exam: Appears calm and comfortable Respiratory system: Clear to auscultation. Respiratory effort normal. Cardiovascular system: S1 &  S2 heard, RRR. No murmurs, rubs, gallops or clicks. Gastrointestinal system: Abdomen is nondistended, soft and nontender. No organomegaly or masses felt. Normal bowel sounds heard. Central nervous system: Alert. Tremor noted. Answers some questions but does not answer most questions. Musculoskeletal: No edema. No calf tenderness Skin: No cyanosis. No rashes     Data Reviewed: I have personally reviewed following labs and imaging studies  CBC Lab Results  Component Value Date   WBC 5.1 08/03/2021   RBC 4.39 08/03/2021   HGB 12.4 (L) 08/03/2021   HCT 40.7 08/03/2021   MCV 92.7 08/03/2021   MCH 28.2 08/03/2021   PLT 96 (L) 08/03/2021   MCHC 30.5 08/03/2021   RDW 15.9 (H) 08/03/2021   LYMPHSABS 1.0 08/01/2021   MONOABS 0.3 08/01/2021   EOSABS 0.1 08/01/2021   BASOSABS 0.0 [id number]     Last metabolic panel Lab Results  Component Value Date   NA 154 (H) 08/03/2021   K 2.9 (L) 08/03/2021   CL 116 (H) 08/03/2021   CO2 29 08/03/2021   BUN 14 08/03/2021   CREATININE 1.34 (H) 08/03/2021   GLUCOSE 132 (H) 08/03/2021   GFRNONAA 50 (L) 08/03/2021   GFRAA 65 11/11/2020   CALCIUM 8.1 (L) 08/03/2021   PHOS 2.8 08/03/2021   PROT 7.2 08/01/2021   ALBUMIN 3.4 (L) 08/01/2021   LABGLOB 3.2 11/11/2020   AGRATIO 1.2 11/11/2020   BILITOT 1.1 08/01/2021   ALKPHOS 82 08/01/2021   AST 30 08/01/2021   ALT 15 08/01/2021   ANIONGAP 9 08/03/2021    CBG (last 3)  No results for input(s): GLUCAP in the last 72 hours.   GFR: Estimated Creatinine Clearance: 35.2 mL/min (A) (by C-G formula based on SCr of 1.34 mg/dL (H)).  Coagulation Profile: No results for input(s): INR, PROTIME in the last 168 hours.  Recent Results (from the past 240 hour(s))  Resp Panel by RT-PCR (Flu A&B, Covid) Urine, Catheterized     Status: None   Collection Time: 08/01/21  3:37 PM   Specimen: Urine, Catheterized; Nasopharyngeal(NP) swabs in vial transport medium  Result Value Ref Range Status   SARS  Coronavirus 2 by RT PCR NEGATIVE NEGATIVE Final    Comment: (NOTE) SARS-CoV-2 target nucleic acids are NOT DETECTED.  The SARS-CoV-2 RNA is generally detectable in upper respiratory specimens during the acute phase of infection. The lowest concentration of SARS-CoV-2 viral copies this assay can detect is 138 copies/mL. A negative result does not preclude SARS-Cov-2 infection and should not be used as the sole basis for treatment or other patient management decisions. A negative result may occur with  improper specimen collection/handling, submission of specimen other than nasopharyngeal swab, presence of viral mutation(s) within the areas targeted by this assay, and inadequate number of viral copies(<138 copies/mL). A negative result must be combined with clinical observations, patient history, and epidemiological information. The expected result is Negative.  Fact Sheet for Patients:  EntrepreneurPulse.com.au  Fact Sheet for Healthcare Providers:  IncredibleEmployment.be  This test is no t yet approved or cleared by the [place] FDA and  has been authorized for detection and/or diagnosis of SARS-CoV-2 by FDA under an Emergency Use Authorization (EUA). This EUA will remain  in effect (meaning this test can be used) for the duration of  the COVID-19 declaration under Section 564(b)(1) of the Act, 21 U.S.C.section 360bbb-3(b)(1), unless the authorization is terminated  or revoked sooner.       Influenza A by PCR NEGATIVE NEGATIVE Final   Influenza B by PCR NEGATIVE NEGATIVE Final    Comment: (NOTE) The Xpert Xpress SARS-CoV-2/FLU/RSV plus assay is intended as an aid in the diagnosis of influenza from Nasopharyngeal swab specimens and should not be used as a sole basis for treatment. Nasal washings and aspirates are unacceptable for Xpert Xpress SARS-CoV-2/FLU/RSV testing.  Fact Sheet for  Patients: EntrepreneurPulse.com.au  Fact Sheet for Healthcare Providers: IncredibleEmployment.be  This test is not yet approved or cleared by the [place] FDA and has been authorized for detection and/or diagnosis of SARS-CoV-2 by FDA under an Emergency Use Authorization (EUA). This EUA will remain in effect (meaning this test can be used) for the duration of the COVID-19 declaration under Section 564(b)(1) of the Act, 21 U.S.C. section 360bbb-3(b)(1), unless the authorization is terminated or revoked.  Performed at [facility], [place] [address], [place], [place] [address]         Radiology Studies: CT HEAD WO CONTRAST (5MM)  Result Date: 08/01/2021 CLINICAL DATA:  Parkinson's disease.  Generalized weakness. EXAM: CT HEAD WITHOUT CONTRAST TECHNIQUE: Contiguous axial images were obtained from the base of the skull through the vertex without intravenous contrast. COMPARISON:  06/07/2021 FINDINGS: Brain: There is no evidence of an acute infarct, intracranial hemorrhage, mass, midline shift, or extra-axial fluid collection. Confluent hypodensities in the cerebral white matter bilaterally are unchanged and nonspecific but compatible with severe chronic small vessel ischemic disease. A chronic infarct is again noted involving the right basal ganglia and internal capsule. There is mild-to-moderate cerebral atrophy. Vascular: Calcified atherosclerosis at the skull base. No hyperdense vessel. Skull: No fracture or suspicious osseous lesion. Sinuses/Orbits: Paranasal sinuses and mastoid air cells are clear. Bilateral cataract extraction. Other: None. IMPRESSION: 1. No evidence of acute intracranial abnormality. 2. Severe chronic small vessel ischemic disease. Electronically Signed   By: Logan Bores M.D.   On: 08/01/2021 16:29   DG Chest Portable 1 View  Result Date: 08/01/2021 CLINICAL DATA:  Weakness EXAM: PORTABLE CHEST 1 VIEW COMPARISON:  06/07/2021,  CT 10/29/2016 FINDINGS: Right lung grossly clear. Hazy atelectasis at the left base. No pleural effusion. Normal cardiac size. No pneumothorax. IMPRESSION: No active disease. Mild chronic elevation left diaphragm with probable scarring and atelectasis Electronically Signed   By: Donavan Foil M.D.   On: 08/01/2021 16:21        Scheduled Meds:  amLODipine  10 mg Oral Daily   apalutamide  120 mg Oral Daily   carbidopa-levodopa  1 tablet Oral TID   donepezil  5 mg Oral QHS   DULoxetine  30 mg Oral BID   enoxaparin (LOVENOX) injection  30 mg Subcutaneous Q24H   gabapentin  600 mg Oral QHS   gabapentin  1,200 mg Oral BID   simvastatin  40 mg Oral Daily   tamsulosin  0.4 mg Oral Daily   Continuous Infusions:  dextrose 50 mL/hr at 08/03/21 0455   potassium chloride       LOS: 2 days     Cordelia Poche, MD [facility] 08/03/2021, 9:33 AM  If 7PM-7AM, please contact night-coverage [email or web address]

## 2021-08-03 NOTE — Consult Note (Signed)
Palliative Medicine Inpatient Consult Note  Consulting Provider: Mariel Aloe, MD  Reason for consult:   [place] Palliative Medicine Consult  Reason for Consult? Goals of care. Daughter is in [place] tomorrow but is available via telephone. Will be back on Tuesday. Nephew is also involved. No other children are alive.   HPI:  Per intake H&P --> Casey Fisher is a 87 y.o. male with medical history significant of prostate cancer, parkinsonism/neuromuscular disorder/memory difficulty, peripheral neuropathy.  Patient presented secondary to decreased oral intake was found to have severe hypernatremia secondary to dehydration.  Patient was started on IV fluids.  Speech therapy consulted for possible swallow difficulties.  Clinical Assessment/Goals of Care:  *Please note that this is a verbal dictation therefore any spelling or grammatical errors are due to the "Zeig[employer] One" system interpretation.  I have reviewed medical records including EPIC notes, labs and imaging, received report from bedside RN, assessed the patient who is lying in bed in NAD. He is minimally verbal and parroting my questions.   I called patients daughter, Casey Fisher to further discuss diagnosis prognosis, GOC, EOL wishes, disposition and options.  A brief review of patients medical history was completed inclusive of his history of parkinsonism, dementia, peripheral neuropathy, and prostate cancer.  We reviewed that [name] for the past few months, since May, has had a decrease in his appetite and ongoing weakness.  Patient's daughter shares with me that she feels like some of this is related to the attention Yaron gets from his grandson and personally hired care aides.  She shares that she feels he may be putting on a bit of a show.  I reviewed with [name] that [name] is 87 years old and has progressive dementia.  We reviewed that it is not uncommon for patients with progressive  dementia to have ongoing failure to thrive and start to have decreased ability to mobilize.  I shared that it seems like at least from what she is sharing with me in the charts notes that he is encroaching upon his final phases in life.   I introduced Palliative Medicine as specialized medical care for people living with serious illness. It focuses on providing relief from the symptoms and stress of a serious illness. The goal is to improve quality of life for both the patient and the family.  [name] shares with me that [place] lives in [place], [place].  He is a widower.  He has 5 children 4 of whom are deceased.  He used to work for a trucking and Engineer, civil (consulting).  He is a man of faith and practices within the [employer] denomination.  To hospitalization [name] had an impairment in his ability to mobilize.  At one point in time he was able to use a front wheel walker though as of recently he has become profoundly weak.  He was reliant on care aides for bathing, dressing, meals, and pill organization.  A detailed discussion was had today regarding advanced directives -not on file though [name] is Bladen only living child therefore she would be his surrogate Media planner.    Concepts specific to code status, artifical feeding and hydration, continued IV antibiotics and rehospitalization was had.  We reviewed that given [name] frail state, co-morbidities, and advanced age that his body would endure a great trauma with cardiopulmonary resuscitation with little to no benefit.  There are multitude of social and emotional factors at play with [name] and Estal'  relationship I was  able to identify through she and I talking.  She shares how close her relationship to her father is and also how disarrayed. He reviews with me that she was a "oops" baby and that the difference in age between her siblings was that of 19+ years.  She shares that she never had is clearly defined of a path as her  siblings which cause discord between she and her father throughout her life.  I offered her support through therapeutic listening.   We reviewed [name] together inclusive of the notes from the speech therapist and physical therapy, Occupational Therapy teams.  We discussed the reasons why he is likely declining as above in the setting of his age, frailty factor, comorbidities. The difference between a aggressive medical intervention path  and a palliative comfort care path for this patient at this time was had.  I shared with [name] everything that I have been able to obstruct from her in the chart indicates that [place] entering his end-of-life phases and he is hospice appropriate.  [name] states that she does not feel ready for that at this moment and asks if he can go to skilled nursing to see if he improves.  She shares if he does not improve then the decision will be clear for hospice care.  Discussed the importance of continued conversation with family and their  medical providers regarding overall plan of care and treatment options, ensuring decisions are within the context of the patients values and GOCs.  Decision Maker: Casey Fisher: [phone]  SUMMARY OF RECOMMENDATIONS   DNAR/DNI  Patients daughter would like for [name] to transition to Skilled nursing and if he does not thrive there to consider hospice enrollment  I will request OP Palliative support as I worry given Kaz' poor nutritional and physical condition he may require hospice sooner than later  Ongoing PMT support  Code Status/Advance Care Planning: DNAR/DNI  Palliative Prophylaxis:  Oral Care, Mobility  Additional Recommendations (Limitations, Scope, Preferences): Continue current scope of care   Psycho-social/Spiritual:  Desire for further Chaplaincy support: No Additional Recommendations: Education on chronic disease processes   Prognosis: Poor given multi-system failure  Discharge  Planning:   Vitals:   08/03/21 0825 08/03/21 1131  BP: (!) 132/99 101/77  Pulse: 92 85  Resp: 10 16  Temp: 98.2 F (36.8 C) 98.2 F (36.8 C)  SpO2:  96%    Intake/Output Summary (Last 24 hours) at 08/03/2021 1640 Last data filed at 08/03/2021 1518 Gross per 24 hour  Intake 3293.45 ml  Output --  Net 3293.45 ml   Last Weight  Most recent update: 08/03/2021 12:48 AM    Weight  69.4 kg (153 lb)           Gen: Elderly AA M in NAD HEENT: moist mucous membranes CV: Regular rate and rhythm  PULM: clear to auscultation bilaterally  ABD: soft/nontender  EXT: No edema  Neuro: Alert and oriented x1  PPS: 20%   This conversation/these recommendations were discussed with patient primary care team, Dr. Lonny Prude  Time In: 1600 Time Out: 1741 Total Time: 101 Greater than 50%  of this time was spent counseling and coordinating care related to the above assessment and plan.  [place] Team Team Cell Phone: [phone] Please utilize secure chat with additional questions, if there is no response within 30 minutes please call the above phone number  Palliative Medicine Team providers are available by phone from 7am to 7pm daily and  can be reached through the team cell phone.  Should this patient require assistance outside of these hours, please call the patient's attending physician.

## 2021-08-04 ENCOUNTER — Telehealth: Payer: Self-pay | Admitting: Family Medicine

## 2021-08-04 ENCOUNTER — Other Ambulatory Visit: Payer: Self-pay | Admitting: *Deleted

## 2021-08-04 ENCOUNTER — Inpatient Hospital Stay (HOSPITAL_COMMUNITY): Payer: Medicare Other

## 2021-08-04 LAB — BASIC METABOLIC PANEL
Anion gap: 6 (ref 5–15)
Anion gap: 8 (ref 5–15)
BUN: 15 mg/dL (ref 8–23)
BUN: 14 mg/dL (ref 8–23)
CO2: 30 mmol/L (ref 22–32)
CO2: 29 mmol/L (ref 22–32)
Calcium: 7.8 mg/dL — ABNORMAL LOW (ref 8.9–10.3)
Calcium: 8 mg/dL — ABNORMAL LOW (ref 8.9–10.3)
Chloride: 112 mmol/L — ABNORMAL HIGH (ref 98–111)
Chloride: 110 mmol/L (ref 98–111)
Creatinine, Ser: 1.6 mg/dL — ABNORMAL HIGH (ref 0.61–1.24)
Creatinine, Ser: 1.43 mg/dL — ABNORMAL HIGH (ref 0.61–1.24)
GFR, Estimated: 40 mL/min — ABNORMAL LOW (ref >60)
GFR, Estimated: 46 mL/min — ABNORMAL LOW (ref >60)
Glucose, Bld: 143 mg/dL — ABNORMAL HIGH (ref 70–99)
Glucose, Bld: 139 mg/dL — ABNORMAL HIGH (ref 70–99)
Potassium: 2.8 mmol/L — ABNORMAL LOW (ref 3.5–5.1)
Potassium: 2.9 mmol/L — ABNORMAL LOW (ref 3.5–5.1)
Sodium: 148 mmol/L — ABNORMAL HIGH (ref 135–145)
Sodium: 147 mmol/L — ABNORMAL HIGH (ref 135–145)

## 2021-08-04 LAB — CBC
HCT: 38.7 % — ABNORMAL LOW (ref 39.0–52.0)
Hemoglobin: 12.1 g/dL — ABNORMAL LOW (ref 13.0–17.0)
MCH: 28.8 pg (ref 26.0–34.0)
MCHC: 31.3 g/dL (ref 30.0–36.0)
MCV: 92.1 fL (ref 80.0–100.0)
Platelets: 95 10*3/uL — ABNORMAL LOW (ref 150–400)
RBC: 4.2 MIL/uL — ABNORMAL LOW (ref 4.22–5.81)
RDW: 15.9 % — ABNORMAL HIGH (ref 11.5–15.5)
WBC: 5.5 10*3/uL (ref 4.0–10.5)
nRBC: 0 % (ref 0.0–0.2)

## 2021-08-04 LAB — NA AND K (SODIUM & POTASSIUM), RAND UR
Potassium Urine: 38 mmol/L
Sodium, Ur: 43 mmol/L

## 2021-08-04 LAB — OSMOLALITY, URINE: Osmolality, Ur: 474 mOsm/kg (ref 300–900)

## 2021-08-04 LAB — PHOSPHORUS: Phosphorus: 3.2 mg/dL (ref 2.5–4.6)

## 2021-08-04 LAB — CREATININE, URINE, RANDOM: Creatinine, Urine: 45.41 mg/dL

## 2021-08-04 LAB — MAGNESIUM: Magnesium: 2.3 mg/dL (ref 1.7–2.4)

## 2021-08-04 MED ORDER — POTASSIUM CHLORIDE 10 MEQ/100ML IV SOLN
10.0000 meq | INTRAVENOUS | Status: AC
Start: 2021-08-04 — End: 2021-08-04
  Administered 2021-08-04 (×6): 10 meq via INTRAVENOUS
  Filled 2021-08-04 (×5): qty 100

## 2021-08-04 NOTE — Progress Notes (Signed)
Modified Barium Swallow Progress Note  Patient Details  Name: Casey Fisher MRN: [id number] Date of Birth: 09-29-1928  Today's Date: 08/04/2021  Modified Barium Swallow completed.  Full report located under Chart Review in the Imaging Section.  Brief recommendations include the following:  Clinical Impression  Pt presents with primary oral dysphagia with mild pharyngeal dysphagia and likely cognitive component. He exhibited decreased bolus cohesion, lingual pumping and prolonged bolus hold with solids, requiring repeat liquid washes to initiate swallow, with lingual/palatal residue remaining. Pt occasionally performed spontaneous dry swallows to clear residue, but otherwise required max verbal cues and liquid washes, which did not consistently eliminate residue. Thin liquids via straw resulted in only x1 instance of flash penetration (PAS 2) with any material being ejected from airway via strength of laryngeal vestibule closure. This is considered WFL. It should be noted that a delay in swallow initation was observed with thin liquids, which filled the pyriform sinuses and vallecular space before swallow was initated, but again, no penetration/aspiration noted except in described episode. Pt was unable to achieve A-P transit of pill whole with puree and it was ultimately manually removed from oral cavity. Recommend dysphagia 1 (puree)/thin liquid diet with family/staff to assist pt with small bites/sips, slow rate of intake, and alternation of solids with liquids. SLP to f/u for tolerance.   Swallow Evaluation Recommendations       SLP Diet Recommendations: Dysphagia 1 (Puree) solids;Thin liquid   Liquid Administration via: Cup;Straw   Medication Administration: Crushed with puree   Supervision: Full supervision/cueing for compensatory strategies;Full assist for feeding   Compensations: Minimize environmental distractions;Slow rate;Small sips/bites;Follow solids with liquid   Postural  Changes: Seated upright at 90 degrees   Oral Care Recommendations: Oral care BID;Staff/trained caregiver to provide oral care      Ellwood Dense, [place], [place] Office Number: [place] 08/04/2021,3:29 PM

## 2021-08-04 NOTE — Progress Notes (Signed)
Initial Nutrition Assessment  DOCUMENTATION CODES:   Severe malnutrition in context of chronic illness  INTERVENTION:   -Ensure Enlive po TID, each supplement provides 350 kcal and 20 grams of protein  -Magic cup TID with meals, each supplement provides 290 kcal and 9 grams of protein  -MVI with minerals daily  NUTRITION DIAGNOSIS:   Severe Malnutrition related to chronic illness (Parkinson's disease) as evidenced by moderate fat depletion, severe fat depletion, moderate muscle depletion, severe muscle depletion, percent weight loss.  GOAL:   Patient will meet greater than or equal to 90% of their needs  MONITOR:   PO intake, Supplement acceptance, Diet advancement, Labs, Weight trends, Skin, I & O's  REASON FOR ASSESSMENT:   Consult Assessment of nutrition requirement/status  ASSESSMENT:   Casey Fisher is a 87 y.o. male with medical history significant of prostate cancer, parkinsonism/neuromuscular disorder/memory difficulty, peripheral neuropathy. Patient has not been swallowing well for about 2 weeks and has gradually worsened to where he takes very small mounts of food. They tried calling his PCP but earliest visit was 8/31. He will intake very soft/liquid foods like applesauce and jello, but very small portions.  Pt admitted with hypernatremia.   8/6- s/p BSE- advanced to dysphagia 2 diet with thin liquids 8/8- s/p BSE- downgraded to dysphagia 1 diet with thin liquids  Reviewed I/O's: +4 L x 24 hours and +4.2 L since admission  UOP: 400 ml x 24 hours  Per SLP, plan for MBSS today.   Pt very lethargic at time of visit. He responded to his name being called and asked "Did you wake me up?". RD introduced self and reason for visit, but pt fell asleep for the rest of the visit and did not contribute to conversation.   History obtained from pt's grandson at bedside. He reports pt has experienced a general decline in health over the past 3 weeks. Prior to this, he  reports pt was eating fairly well, consuming 3 meals per day as well as 2-3 Ensure supplements per day. Pt's intake has become progressively worse, only eating bites of applesauce over the past week. Noted pt consumed a few bites of pudding for lunch. Noted meal completion 10-25%.   Grandson endorses wt loss, but unsure how much of UBW. Per wt hx, pt has experienced a 9% wt loss over the past 3 months, which is significant for time frame.   Discussed importance of good meal and supplement intake to promote healing. Pt amenable to Ensure and Magic Cups.   Medications reviewed and include sinemet and dextrose 5% solution @ 75 ml/hr.   Labs reviewed: Na: 148, K: 2.8 (on IV supplementation).    NUTRITION - FOCUSED PHYSICAL EXAM:  Flowsheet Row Most Recent Value  Orbital Region Unable to assess  Upper Arm Region Moderate depletion  Thoracic and Lumbar Region Moderate depletion  Buccal Region Severe depletion  Temple Region Unable to assess  Clavicle Bone Region Severe depletion  Clavicle and Acromion Bone Region Severe depletion  Scapular Bone Region Severe depletion  Dorsal Hand Moderate depletion  Patellar Region Moderate depletion  Anterior Thigh Region Moderate depletion  Posterior Calf Region Moderate depletion  Edema (RD Assessment) None  Hair Reviewed  Eyes Reviewed  Mouth Reviewed  Skin Reviewed  Nails Reviewed        Diet Order:   Diet Order             DIET - DYS 1 Room service appropriate? No; Fluid consistency: Thin  Diet effective now                   EDUCATION NEEDS:   Education needs have been addressed  Skin:  Skin Assessment: Reviewed RN Assessment  Last BM:  08/03/21  Height:   Ht Readings from Last 1 Encounters:  08/01/21 '6\' 2"'$  (1.88 m)    Weight:   Wt Readings from Last 1 Encounters:  08/04/21 71.4 kg    Ideal Body Weight:  86.4 kg  BMI:  Body mass index is 20.21 kg/m.  Estimated Nutritional Needs:   Kcal:  2100-2300  Protein:   95-110 grams  Fluid:  > 2 L    Loistine Chance, RD, LDN, [place] Registered Dietitian II Certified Diabetes Care and Education Specialist Please refer to [facility] for RD and/or RD on-call/weekend/after hours pager

## 2021-08-04 NOTE — Telephone Encounter (Signed)
..  Home Health Certification or Plan of Care Tracking  Is this a Certification or Plan of Care?  Yes  HH Agency:[facility]  Order Number:  [id number]  Has charge sheet been attached? yes  Where has form been placed:   In Dr. Vonna Kotyk bin up front

## 2021-08-04 NOTE — Progress Notes (Signed)
   Palliative Medicine Inpatient Follow Up Note  Consulting Provider: Mariel Aloe, MD   Reason for consult:   [place] Palliative Medicine Consult  Reason for Consult? Goals of care. Daughter is in [place] tomorrow but is available via telephone. Will be back on Tuesday. Nephew is also involved. No other children are alive.    HPI:  Per intake H&P --> JASIYAH PAULDING is a 87 y.o. male with medical history significant of prostate cancer, parkinsonism/neuromuscular disorder/memory difficulty, peripheral neuropathy.  Patient presented secondary to decreased oral intake was found to have severe hypernatremia secondary to dehydration.  Patient was started on IV fluids.  Speech therapy consulted for possible swallow difficulties. Palliative care was asked to get involved to discuss goals of care.  Today's Discussion (08/04/2021):  *Please note that this is a verbal dictation therefore any spelling or grammatical errors are due to the "[employer]" system interpretation.  Chart reviewed. Na is slowly improving. PT/OT rec. SNF placement  I met with Jeneen Rinks this morning. He was noted to be comfortable and not in any distress. He was able to share that he was not hurting anywhere.   No family was present at bedside.   Objective Assessment: Vital Signs Vitals:   08/04/21 0833 08/04/21 1217  BP: 128/76 118/71  Pulse: 83 80  Resp: 18 18  Temp:  98.7 F (37.1 C)  SpO2: 99% 98%    Intake/Output Summary (Last 24 hours) at 08/04/2021 1525 Last data filed at 08/04/2021 1315 Gross per 24 hour  Intake 1416.91 ml  Output 400 ml  Net 1016.91 ml   Last Weight  Most recent update: 08/04/2021  4:01 AM    Weight  71.4 kg (157 lb 6.5 oz)            Gen: Elderly AA M in NAD HEENT: moist mucous membranes CV: Regular rate and rhythm PULM: clear to auscultation bilaterally ABD: soft/nontender EXT: No edema Neuro: Alert and oriented x1  SUMMARY OF  RECOMMENDATIONS   DNAR/DNI   Patients daughter would like for Demyan to transition to Skilled nursing and if he does not thrive there to consider hospice enrollment   I will request OP Palliative support as I worry given Kou' poor nutritional and physical condition he may require hospice sooner than later   Ongoing PMT support  Time Spent: 15 Greater than 50% of the time was spent in counseling and coordination of care ______________________________________________________________________________________ [facility] Team Team Cell Phone: [phone] Please utilize secure chat with additional questions, if there is no response within 30 minutes please call the above phone number  Palliative Medicine Team providers are available by phone from 7am to 7pm daily and can be reached through the team cell phone.  Should this patient require assistance outside of these hours, please call the patient's attending physician.

## 2021-08-04 NOTE — Progress Notes (Signed)
  Speech Language Pathology Treatment: Dysphagia  Patient Details Name: Casey Fisher MRN: [id number] DOB: 24-May-1928 Today's Date: 08/04/2021 Time: [id number] SLP Time Calculation (min) (ACUTE ONLY): 17 min  Assessment / Plan / Recommendation Clinical Impression  Pt seen for diet tolerance with pm meal consisting of dysphagia 1 and 2 solids, thin liquids. Pt very lethargic, requiring consistent verbal cues to maintain alertness during PO intake. Suspect lethargy primarily impacted bolus acceptance, manipulation and clearance of oral residuals, which were all reduced compared to initial evaluation. No overt s/sx of aspiration observed across trials, but repeated liquids washes were required to clear oral residuals. Notified family member at bedside that instrumental testing to further assess swallow physiology will be completed today per radiology availability. It is scheduled for completion this afternoon. Further diet recommendations pending results of testing.    HPI HPI: Pt is a 87 y.o. male brought to ED by family with reports of decreased responsiveness and poor p.o. intake. Pt has not been swallowing well for about 2 weeks and has gradually worsened to where he takes very small mounts of food. He consumes very soft/liquid foods like applesauce and jello. Labs upon admission revealed pt is hypernatremic. Chest xray (08/01/21) revealed no active disease. CT head (08/01/21) revealed No evidence of acute intracranial abnormality. PMH: prostate cancer, parkinsonism/neuromuscular disorder, memory difficulty, peripheral neuropathy.      SLP Plan  MBS       Recommendations  Diet recommendations: Dysphagia 1 (puree);Thin liquid Liquids provided via: Straw Medication Administration: Whole meds with puree Supervision: Staff to assist with self feeding;Full supervision/cueing for compensatory strategies Compensations: Minimize environmental distractions;Slow rate;Small sips/bites;Follow solids with  liquid Postural Changes and/or Swallow Maneuvers: Seated upright 90 degrees                Oral Care Recommendations: Oral care BID;Staff/trained caregiver to provide oral care Follow up Recommendations: 24 hour supervision/assistance;Skilled Nursing facility SLP Visit Diagnosis: Dysphagia, unspecified (R13.10) Plan: MBS       GO              [address], [place], [place] Office Number: [phone]   Acie Fredrickson 08/04/2021, 12:48 PM

## 2021-08-04 NOTE — Progress Notes (Signed)
PROGRESS NOTE    Casey Fisher  [id number] DOB: 02-24-1928 DOA: 08/01/2021 PCP: [name], MD   Brief Narrative: Casey Fisher is a 87 y.o. male with medical history significant of prostate cancer, parkinsonism/neuromuscular disorder/memory difficulty, peripheral neuropathy.  Patient presented secondary to decreased oral intake was found to have severe hypernatremia secondary to dehydration.  Patient was started on IV fluids.  Speech therapy consulted for possible swallow difficulties.   Assessment & Plan:   Principal Problem:   Hypernatremia Active Problems:   Peripheral neuropathy   Hyperlipidemia   Memory difficulty   AKI (acute kidney injury) ([place])   Decreased oral intake   Dehydration   Parkinsonism (HCC)   Hypernatremia No associated hypovolemia; patient is hypertensive. Mental status baseline. Patient given normal saline IV fluids in the ED.  Sodium and chloride of 166/121 respectively on admission. Down to 148 today -Continue D5 water at 75 mL/h  -Check BMP this evening, and if resolved, will discontinue IV fluids -BMP daily   Concern for failure to thrive Poor oral intake -Dietitian consult pending -Speech therapy recommendations: Downgraded to Dysphagia 1 diet, MBS   AKI Baseline creatinine is about 1.1.  On admission, creatinine of 1.92 in setting of dehydration.  Given 1 L normal saline bolus in the ED. Improved but has stabilized somewhat, possibly related to previous decrease in fluid rate. -IV fluids as mentioned above   Hypokalemia Patient given 10 mEq of potassium in the ED. Continued supplementation given. Persistent. He has received 170 meq of potassium IV. No associated acidosis so RTA unlikely. No associated hypotension or hyponatremia. It is possible this could be related to refeeding however phosphorus and magnesium are normal -Potassium 10 meq IV x6 runs -Urine sodium/potassium/creatinine/osmolality still  pending   Primary hypertension Patient is on hydrochlorothiazide as an outpatient.  Hold on admission.  Blood pressure is currently uncontrolled. -Continue amlodipine 10 mg daily   Parkinsonism Neuromuscular disorder Memory difficulty No formal diagnosis of dementia.  Patient is on Aricept and Sinemet as an outpatient follows with Dr. [name], neurology. -Continue Sinemet and Aricept in addition to duloxetine -Daily magnesium, phosphorus, potassium -Palliative care consulted   Peripheral neuropathy Patient is prescribed gabapentin as an outpatient. -Continue gabapentin 300 mg TID secondary to renal impairment   Chronic diastolic heart dysfunction Echo from 2018 significant for normal EF and grade 2 diastolic dysfunction.  Patient dehydrated on admission. No evidence of fluid overload.   Hyperlipidemia On simvastatin as an outpatient but unconfirmed.   Thrombocytopenia Mild. Repeat CBC   History of prostate cancer Currently in remission.  Patient is on tamsulosin, Erleada, Lupron as an outpatient per med history.  -Continue home apalutamide and Flomax   DVT prophylaxis: Lovenox Code Status:   Code Status: DNR Family Communication: Daughter on telephone Disposition Plan: Discharge SNF pending improvement of sodium and potassium. Anticipate readiness for discharge in 2-3 days. Discharge with recommendation for palliative care   Consultants:  Palliative care  Procedures:  None  Antimicrobials: None    Subjective: No issues overnight.  Objective: Vitals:   08/03/21 1600 08/03/21 2035 08/04/21 0355 08/04/21 0833  BP:  121/74 (!) 130/92 128/76  Pulse:  95 81 83  Resp: '13  16 18  '$ Temp:  98.3 F (36.8 C) 98 F (36.7 C)   TempSrc:  Oral    SpO2:   94% 99%  Weight:   71.4 kg   Height:        Intake/Output Summary (Last 24 hours)  at 08/04/2021 1018 Last data filed at 08/04/2021 0400 Gross per 24 hour  Intake 4230.36 ml  Output 400 ml  Net 3830.36 ml    Filed  Weights   08/02/21 0049 08/03/21 0048 08/04/21 0355  Weight: 69.9 kg 69.4 kg 71.4 kg    Examination:  General exam: Appears calm and comfortable HEENT: what appears to be residual food that is tan appearing on upper dentures and tongue; does not seem to be consistent with thrush Respiratory system: Clear to auscultation. Respiratory effort normal. Cardiovascular system: S1 & S2 heard, RRR. No murmurs, rubs, gallops or clicks. Gastrointestinal system: Abdomen is nondistended, soft and nontender. No organomegaly or masses felt. Normal bowel sounds heard. Central nervous system: Sleeping. Musculoskeletal: No edema. No calf tenderness Skin: No cyanosis. No new rashes   Data Reviewed: I have personally reviewed following labs and imaging studies  CBC Lab Results  Component Value Date   WBC 5.5 08/04/2021   RBC 4.20 (L) 08/04/2021   HGB 12.1 (L) 08/04/2021   HCT 38.7 (L) 08/04/2021   MCV 92.1 08/04/2021   MCH 28.8 08/04/2021   PLT 95 (L) 08/04/2021   MCHC 31.3 08/04/2021   RDW 15.9 (H) 08/04/2021   LYMPHSABS 1.0 08/01/2021   MONOABS 0.3 08/01/2021   EOSABS 0.1 08/01/2021   BASOSABS 0.0 [id number]     Last metabolic panel Lab Results  Component Value Date   NA 148 (H) 08/04/2021   K 2.8 (L) 08/04/2021   CL 112 (H) 08/04/2021   CO2 30 08/04/2021   BUN 15 08/04/2021   CREATININE 1.60 (H) 08/04/2021   GLUCOSE 143 (H) 08/04/2021   GFRNONAA 40 (L) 08/04/2021   GFRAA 65 11/11/2020   CALCIUM 7.8 (L) 08/04/2021   PHOS 3.2 08/04/2021   PROT 7.2 08/01/2021   ALBUMIN 3.4 (L) 08/01/2021   LABGLOB 3.2 11/11/2020   AGRATIO 1.2 11/11/2020   BILITOT 1.1 08/01/2021   ALKPHOS 82 08/01/2021   AST 30 08/01/2021   ALT 15 08/01/2021   ANIONGAP 6 08/04/2021    CBG (last 3)  No results for input(s): GLUCAP in the last 72 hours.   GFR: Estimated Creatinine Clearance: 30.4 mL/min (A) (by C-G formula based on SCr of 1.6 mg/dL (H)).  Coagulation Profile: No results for input(s):  INR, PROTIME in the last 168 hours.  Recent Results (from the past 240 hour(s))  Resp Panel by RT-PCR (Flu A&B, Covid) Urine, Catheterized     Status: None   Collection Time: 08/01/21  3:37 PM   Specimen: Urine, Catheterized; Nasopharyngeal(NP) swabs in vial transport medium  Result Value Ref Range Status   SARS Coronavirus 2 by RT PCR NEGATIVE NEGATIVE Final    Comment: (NOTE) SARS-CoV-2 target nucleic acids are NOT DETECTED.  The SARS-CoV-2 RNA is generally detectable in upper respiratory specimens during the acute phase of infection. The lowest concentration of SARS-CoV-2 viral copies this assay can detect is 138 copies/mL. A negative result does not preclude SARS-Cov-2 infection and should not be used as the sole basis for treatment or other patient management decisions. A negative result may occur with  improper specimen collection/handling, submission of specimen other than nasopharyngeal swab, presence of viral mutation(s) within the areas targeted by this assay, and inadequate number of viral copies(<138 copies/mL). A negative result must be combined with clinical observations, patient history, and epidemiological information. The expected result is Negative.  Fact Sheet for Patients:  EntrepreneurPulse.com.au  Fact Sheet for Healthcare Providers:  IncredibleEmployment.be  This test is no  t yet approved or cleared by the [place] and  has been authorized for detection and/or diagnosis of SARS-CoV-2 by FDA under an Emergency Use Authorization (EUA). This EUA will remain  in effect (meaning this test can be used) for the duration of the COVID-19 declaration under Section 564(b)(1) of the Act, 21 U.S.C.section 360bbb-3(b)(1), unless the authorization is terminated  or revoked sooner.       Influenza A by PCR NEGATIVE NEGATIVE Final   Influenza B by PCR NEGATIVE NEGATIVE Final    Comment: (NOTE) The Xpert Xpress  SARS-CoV-2/FLU/RSV plus assay is intended as an aid in the diagnosis of influenza from Nasopharyngeal swab specimens and should not be used as a sole basis for treatment. Nasal washings and aspirates are unacceptable for Xpert Xpress SARS-CoV-2/FLU/RSV testing.  Fact Sheet for Patients: EntrepreneurPulse.com.au  Fact Sheet for Healthcare Providers: IncredibleEmployment.be  This test is not yet approved or cleared by the [place] FDA and has been authorized for detection and/or diagnosis of SARS-CoV-2 by FDA under an Emergency Use Authorization (EUA). This EUA will remain in effect (meaning this test can be used) for the duration of the COVID-19 declaration under Section 564(b)(1) of the Act, 21 U.S.C. section 360bbb-3(b)(1), unless the authorization is terminated or revoked.  Performed at [facility], [place] [address], [place], [place] [address]         Radiology Studies: No results found.      Scheduled Meds:  amLODipine  10 mg Oral Daily   apalutamide  120 mg Oral Daily   carbidopa-levodopa  1 tablet Oral TID   donepezil  5 mg Oral QHS   DULoxetine  30 mg Oral BID   enoxaparin (LOVENOX) injection  30 mg Subcutaneous Q24H   gabapentin  300 mg Oral TID   simvastatin  40 mg Oral Daily   tamsulosin  0.4 mg Oral Daily   Continuous Infusions:  dextrose 75 mL/hr at 08/03/21 2052     LOS: 3 days     Cordelia Poche, MD [facility] 08/04/2021, 10:18 AM  If 7PM-7AM, please contact night-coverage [email or web address]

## 2021-08-04 NOTE — Consult Note (Signed)
   [facility] Inpatient Consult   08/04/2021  Casey Fisher [birth year] [id number]  [facility] [ACO] Patient: Medicare CMS DCE  Primary Care Provider:  [name], MD [facility], this is an Embedded provider.  Patient was active with [facility] for chronic disease management services.  Patient has been engaged by a [facility] for Hypertension Management with his caregiver .  Our community based plan of care has focused on disease management and community resource support.    Reviewed patient's electronic medical record with Palliative consult.    Plan: Current recommendations are in for possible skilled nursing facility per daughter's wishes per palliative care notes.  Will await PT/OT evaluation as well.  Plan:  Will continue to follow for disposition and needs.  Of note, [facility] Care Management services does not replace or interfere with any services that are needed or arranged by inpatient [facility] care management team.  For additional questions or referrals please contact:  Natividad Brood, RN BSN [facility] Liaison  [phone] business mobile phone Toll free office [phone]  Fax number: [phone] [email or web address]

## 2021-08-04 NOTE — TOC Progression Note (Signed)
Transition of Care [facility]) - Progression Note    Patient Details  Name: Casey Fisher MRN: [id number] Date of Birth: Feb 27, 1928  Transition of Care [facility]) CM/SW [place], [place] Phone Number: 08/04/2021, 1:38 PM  Clinical Narrative:     CSW called daughter and provided update on disposition. Daughter is hoping for [facility]; CSW explained [facility] has declined. CSW provided additional bed offer; daughter will review offers and contact CSW with update.   Expected Discharge Plan: [place] Barriers to Discharge: Continued Medical Work up, SNF Pending bed offer  Expected Discharge Plan and Services Expected Discharge Plan: [place] arrangements for the past 2 months: Single Family Home                                       Social Determinants of Health (SDOH) Interventions    Readmission Risk Interventions No flowsheet data found.

## 2021-08-05 DIAGNOSIS — E43 Unspecified severe protein-calorie malnutrition: Secondary | ICD-10-CM | POA: Insufficient documentation

## 2021-08-05 LAB — CBC
HCT: 39.2 % (ref 39.0–52.0)
Hemoglobin: 12.3 g/dL — ABNORMAL LOW (ref 13.0–17.0)
MCH: 28.3 pg (ref 26.0–34.0)
MCHC: 31.4 g/dL (ref 30.0–36.0)
MCV: 90.1 fL (ref 80.0–100.0)
Platelets: 107 10*3/uL — ABNORMAL LOW (ref 150–400)
RBC: 4.35 MIL/uL (ref 4.22–5.81)
RDW: 15.2 % (ref 11.5–15.5)
WBC: 6.9 10*3/uL (ref 4.0–10.5)
nRBC: 0 % (ref 0.0–0.2)

## 2021-08-05 LAB — PHOSPHORUS: Phosphorus: 2.6 mg/dL (ref 2.5–4.6)

## 2021-08-05 LAB — BASIC METABOLIC PANEL
Anion gap: 10 (ref 5–15)
Anion gap: 11 (ref 5–15)
BUN: 12 mg/dL (ref 8–23)
BUN: 13 mg/dL (ref 8–23)
CO2: 27 mmol/L (ref 22–32)
CO2: 26 mmol/L (ref 22–32)
Calcium: 8.2 mg/dL — ABNORMAL LOW (ref 8.9–10.3)
Calcium: 8.2 mg/dL — ABNORMAL LOW (ref 8.9–10.3)
Chloride: 108 mmol/L (ref 98–111)
Chloride: 106 mmol/L (ref 98–111)
Creatinine, Ser: 1.26 mg/dL — ABNORMAL HIGH (ref 0.61–1.24)
Creatinine, Ser: 1.19 mg/dL (ref 0.61–1.24)
GFR, Estimated: 54 mL/min — ABNORMAL LOW (ref >60)
GFR, Estimated: 58 mL/min — ABNORMAL LOW (ref >60)
Glucose, Bld: 150 mg/dL — ABNORMAL HIGH (ref 70–99)
Glucose, Bld: 125 mg/dL — ABNORMAL HIGH (ref 70–99)
Potassium: 3.2 mmol/L — ABNORMAL LOW (ref 3.5–5.1)
Potassium: 3.2 mmol/L — ABNORMAL LOW (ref 3.5–5.1)
Sodium: 145 mmol/L (ref 135–145)
Sodium: 143 mmol/L (ref 135–145)

## 2021-08-05 LAB — MAGNESIUM: Magnesium: 2.1 mg/dL (ref 1.7–2.4)

## 2021-08-05 MED ORDER — GABAPENTIN 250 MG/5ML PO SOLN
300.0000 mg | Freq: Three times a day (TID) | ORAL | Status: DC
  Administered 2021-08-05 – 2021-08-07 (×4): 300 mg via ORAL
  Filled 2021-08-05 (×8): qty 6

## 2021-08-05 MED ORDER — POTASSIUM CHLORIDE 20 MEQ PO PACK
40.0000 meq | PACK | ORAL | Status: AC
  Administered 2021-08-05 (×2): 40 meq via ORAL
  Filled 2021-08-05 (×2): qty 2

## 2021-08-05 NOTE — Care Management Important Message (Signed)
Important Message  Patient Details  Name: Casey Fisher MRN: [id number] Date of Birth: 04-19-1928   Medicare Important Message Given:  Yes     Shelda Altes 08/05/2021, 9:38 AM

## 2021-08-05 NOTE — Progress Notes (Signed)
  Speech Language Pathology Treatment: Dysphagia  Patient Details Name: Casey Fisher MRN: [id number] DOB: 1928/04/03 Today's Date: 08/05/2021 Time: [id number] SLP Time Calculation (min) (ACUTE ONLY): 19 min  Assessment / Plan / Recommendation Clinical Impression  Pt seen for follow-up dysphagia treatment post completion of instrumental assessment (08/05/21) with grandson present. Pt very lethargic this am, requiring frequent verbal cues to increase alertness and attention during PO intake. With hand over hand assist, pt self-paced small sips of thin liquids via cup exhibiting no overt s/sx of aspiration. Bites of puree orally accepted and only mildly held in oral cavity prior to initiation of swallow. He occasionally performed spontaneous repeat dry swallow which cleared oral residuals. Cued liquid washes were necessary when independent dry swallow was not initiated. Overall, increased automaticity of swallow observed this date compared to performance during MBS as indicated by reduced necessity for liquid wash to initiate swallow of solid POs. Reviewed results and recommendations of instrumental assessment with grandson via sharing of xray images and explanation. Called daughter to review this as well and provided both family members basic strategies they can utilize at mealtimes to increase pt swallow efficiency and safety including: small bites/sips, slow rate of intake, alternating bites with sips, minimizing environmental distractions, allowing pt to participate in mealtime as much as possible, etc. Family members agreed to education and recommendations this date. Will f/u for continued tolerance of diet and completion of family training in basic swallow/mealtime strategies.    HPI HPI: Pt is a 87 y.o. male brought to ED by family with reports of decreased responsiveness and poor p.o. intake. Pt has not been swallowing well for about 2 weeks and has gradually worsened to where he takes very small  mounts of food. He consumes very soft/liquid foods like applesauce and jello. Labs upon admission revealed pt is hypernatremic. Chest xray (08/01/21) revealed no active disease. CT head (08/01/21) revealed No evidence of acute intracranial abnormality. PMH: prostate cancer, parkinsonism/neuromuscular disorder, memory difficulty, peripheral neuropathy.      SLP Plan  Continue with current plan of care       Recommendations  Diet recommendations: Dysphagia 1 (puree);Thin liquid Liquids provided via: Straw Medication Administration: Crushed with puree Supervision: Staff to assist with self feeding;Full supervision/cueing for compensatory strategies Compensations: Minimize environmental distractions;Slow rate;Small sips/bites;Follow solids with liquid Postural Changes and/or Swallow Maneuvers: Seated upright 90 degrees                Oral Care Recommendations: Oral care BID;Staff/trained caregiver to provide oral care Follow up Recommendations: 24 hour supervision/assistance;Skilled Nursing facility SLP Visit Diagnosis: Dysphagia, oropharyngeal phase (R13.12) Plan: Continue with current plan of care       [facility] [address], [place], [place] Office Number: [phone]   Acie Fredrickson 08/05/2021, 10:31 AM

## 2021-08-05 NOTE — Progress Notes (Signed)
       RE:  Casey Fisher     Date of Birth:  28-Sep-1928   Date:   08/05/21      To Whom It May Concern:  Please be advised that the above-named patient will require a short-term nursing home stay - anticipated 30 days or less for rehabilitation and strengthening.  The plan is for return home.

## 2021-08-05 NOTE — Progress Notes (Signed)
PROGRESS NOTE    Casey Fisher  [id number] DOB: [birth year] DOA: 08/01/2021 PCP: [name], MD   Brief Narrative: Casey Fisher is a 87 y.o. male with medical history significant of prostate cancer, parkinsonism/neuromuscular disorder/memory difficulty, peripheral neuropathy.  Patient presented secondary to decreased oral intake was found to have severe hypernatremia secondary to dehydration.  Patient was started on IV fluids.  Speech therapy consulted for possible swallow difficulties.   Assessment & Plan:   Principal Problem:   Hypernatremia Active Problems:   Peripheral neuropathy   Hyperlipidemia   Memory difficulty   AKI (acute kidney injury) ([place])   Decreased oral intake   Dehydration   Parkinsonism (HCC)   Protein-calorie malnutrition, severe   Hypernatremia No associated hypovolemia; patient is hypertensive. Mental status baseline. Patient given normal saline IV fluids in the ED.  Sodium and chloride of 166/121 respectively on admission. Down to 148 today -Continue D5 water at 75 mL/h  -Check BMP this evening, and if resolved, will discontinue IV fluids -BMP daily   Concern for failure to thrive Poor oral intake -Dietitian consult pending -Speech therapy recommendations: Downgraded to Dysphagia 1 diet, MBS   AKI Baseline creatinine is about 1.1.  On admission, creatinine of 1.92 in setting of dehydration.  Given 1 L normal saline bolus in the ED. Improved but has stabilized somewhat, possibly related to previous decrease in fluid rate. -IV fluids as mentioned above   Hypokalemia Patient given 10 mEq of potassium in the ED. Continued supplementation given. Persistent. He has received 170 meq of potassium IV. No associated acidosis so RTA unlikely. No associated hypotension or hyponatremia. It is possible this could be related to refeeding however phosphorus and magnesium are normal. Patient has received 300 meq of potassium in  the last 4 days. Urine potassium high (random collection) -Will touch base with nephrology -potassium 40 meq x2 today and recheck BMP this evening   Primary hypertension Patient is on hydrochlorothiazide as an outpatient.  Hold on admission.  Blood pressure is currently uncontrolled. -Continue amlodipine 10 mg daily   Parkinsonism Neuromuscular disorder Memory difficulty No formal diagnosis of dementia.  Patient is on Aricept and Sinemet as an outpatient follows with Dr. [name], neurology. -Continue Sinemet and Aricept. Cannot use home duloxetine secondary to inability to crush med -Daily magnesium, phosphorus, potassium -Palliative care consulted   Peripheral neuropathy Patient is prescribed gabapentin as an outpatient. -Continue gabapentin 300 mg TID secondary to renal impairment   Chronic diastolic heart dysfunction Echo from 2018 significant for normal EF and grade 2 diastolic dysfunction.  Patient dehydrated on admission. No evidence of fluid overload.   Hyperlipidemia On simvastatin as an outpatient but unconfirmed.   Thrombocytopenia Mild. Repeat CBC   History of prostate cancer Currently in remission.  Patient is on tamsulosin, Erleada, Lupron as an outpatient per med history.  -Continue home apalutamide. Cannot use Flomax since unable to crush med   DVT prophylaxis: Lovenox Code Status:   Code Status: DNR Family Communication: Grandson at bedside Disposition Plan: Discharge SNF pending improvement of sodium and potassium. Anticipate readiness for discharge in 1-2 days. Discharge with recommendation for palliative care on discharge summary   Consultants:  Palliative care  Procedures:  None  Antimicrobials: None    Subjective: No concerns today. Ate breakfast  Objective: Vitals:   08/04/21 0833 08/04/21 1217 08/04/21 1939 08/05/21 0329  BP: 128/76 118/71 (!) 145/91 (!) 164/75  Pulse: 83 80 80 82  Resp: '18 18 18 '$ 16  Temp:  98.7 F (37.1 C) 98.2 F  (36.8 C) 98.3 F (36.8 C)  TempSrc:  Oral Oral Oral  SpO2: 99% 98% 100% 97%  Weight:    73.2 kg  Height:        Intake/Output Summary (Last 24 hours) at 08/05/2021 1211 Last data filed at 08/04/2021 2300 Gross per 24 hour  Intake 1593.2 ml  Output 900 ml  Net 693.2 ml    Filed Weights   08/03/21 0048 08/04/21 0355 08/05/21 0329  Weight: 69.4 kg 71.4 kg 73.2 kg    Examination:  General exam: Appears calm and comfortable Respiratory system: Clear to auscultation. Respiratory effort normal. Cardiovascular system: S1 & S2 heard, RRR. No murmurs, rubs, gallops or clicks. Gastrointestinal system: Abdomen is nondistended, soft and nontender. No organomegaly or masses felt. Normal bowel sounds heard. Central nervous system: Alert and oriented to person. Right hand pill rolling tremor noted Musculoskeletal: No edema. No calf tenderness Skin: No cyanosis. No rashes Psychiatry: Judgement and insight appear impaired. Psychomotor retardation. Blunt affect   Data Reviewed: I have personally reviewed following labs and imaging studies  CBC Lab Results  Component Value Date   WBC 6.9 08/05/2021   RBC 4.35 08/05/2021   HGB 12.3 (L) 08/05/2021   HCT 39.2 08/05/2021   MCV 90.1 08/05/2021   MCH 28.3 08/05/2021   PLT 107 (L) 08/05/2021   MCHC 31.4 08/05/2021   RDW 15.2 08/05/2021   LYMPHSABS 1.0 08/01/2021   MONOABS 0.3 08/01/2021   EOSABS 0.1 08/01/2021   BASOSABS 0.0 [id number]     Last metabolic panel Lab Results  Component Value Date   NA 145 08/05/2021   K 3.2 (L) 08/05/2021   CL 108 08/05/2021   CO2 27 08/05/2021   BUN 12 08/05/2021   CREATININE 1.26 (H) 08/05/2021   GLUCOSE 150 (H) 08/05/2021   GFRNONAA 54 (L) 08/05/2021   GFRAA 65 11/11/2020   CALCIUM 8.2 (L) 08/05/2021   PHOS 2.6 08/05/2021   PROT 7.2 08/01/2021   ALBUMIN 3.4 (L) 08/01/2021   LABGLOB 3.2 11/11/2020   AGRATIO 1.2 11/11/2020   BILITOT 1.1 08/01/2021   ALKPHOS 82 08/01/2021   AST 30 08/01/2021    ALT 15 08/01/2021   ANIONGAP 10 08/05/2021    CBG (last 3)  No results for input(s): GLUCAP in the last 72 hours.   GFR: Estimated Creatinine Clearance: 39.5 mL/min (A) (by C-G formula based on SCr of 1.26 mg/dL (H)).  Coagulation Profile: No results for input(s): INR, PROTIME in the last 168 hours.  Recent Results (from the past 240 hour(s))  Resp Panel by RT-PCR (Flu A&B, Covid) Urine, Catheterized     Status: None   Collection Time: 08/01/21  3:37 PM   Specimen: Urine, Catheterized; Nasopharyngeal(NP) swabs in vial transport medium  Result Value Ref Range Status   SARS Coronavirus 2 by RT PCR NEGATIVE NEGATIVE Final    Comment: (NOTE) SARS-CoV-2 target nucleic acids are NOT DETECTED.  The SARS-CoV-2 RNA is generally detectable in upper respiratory specimens during the acute phase of infection. The lowest concentration of SARS-CoV-2 viral copies this assay can detect is 138 copies/mL. A negative result does not preclude SARS-Cov-2 infection and should not be used as the sole basis for treatment or other patient management decisions. A negative result may occur with  improper specimen collection/handling, submission of specimen other than nasopharyngeal swab, presence of viral mutation(s) within the areas targeted by this assay, and inadequate number of viral copies(<138 copies/mL). A negative  result must be combined with clinical observations, patient history, and epidemiological information. The expected result is Negative.  Fact Sheet for Patients:  EntrepreneurPulse.com.au  Fact Sheet for Healthcare Providers:  IncredibleEmployment.be  This test is no t yet approved or cleared by the [place] FDA and  has been authorized for detection and/or diagnosis of SARS-CoV-2 by FDA under an Emergency Use Authorization (EUA). This EUA will remain  in effect (meaning this test can be used) for the duration of the COVID-19 declaration  under Section 564(b)(1) of the Act, 21 U.S.C.section 360bbb-3(b)(1), unless the authorization is terminated  or revoked sooner.       Influenza A by PCR NEGATIVE NEGATIVE Final   Influenza B by PCR NEGATIVE NEGATIVE Final    Comment: (NOTE) The Xpert Xpress SARS-CoV-2/FLU/RSV plus assay is intended as an aid in the diagnosis of influenza from Nasopharyngeal swab specimens and should not be used as a sole basis for treatment. Nasal washings and aspirates are unacceptable for Xpert Xpress SARS-CoV-2/FLU/RSV testing.  Fact Sheet for Patients: EntrepreneurPulse.com.au  Fact Sheet for Healthcare Providers: IncredibleEmployment.be  This test is not yet approved or cleared by the [place] FDA and has been authorized for detection and/or diagnosis of SARS-CoV-2 by FDA under an Emergency Use Authorization (EUA). This EUA will remain in effect (meaning this test can be used) for the duration of the COVID-19 declaration under Section 564(b)(1) of the Act, 21 U.S.C. section 360bbb-3(b)(1), unless the authorization is terminated or revoked.  Performed at [facility], [place] [address], [place], [place] [address]         Radiology Studies: DG Swallowing Func-Speech Pathology  Result Date: 08/04/2021 Formatting of this result is different from the original. Objective Swallowing Evaluation: Type of Study: MBS-Modified Barium Swallow Study  Patient Details Name: Casey Fisher MRN: [id number] Date of Birth: 01-10-1928 Today's Date: 08/04/2021 Time: SLP Start Time (ACUTE ONLY): 1356 -SLP Stop Time (ACUTE ONLY): 1410 SLP Time Calculation (min) (ACUTE ONLY): 14 min Past Medical History: Past Medical History: Diagnosis Date  Abnormality of gait 03/24/2016  Anxiety   Cancer ([place])   Cataract   Depression   Heart murmur   Hereditary and idiopathic peripheral neuropathy 03/24/2016  Hypertension   Memory difficulty 03/24/2016  Neuromuscular disorder ([place])    Neuropathy   Parkinson's disease ([place]) 05/07/2021  Prostate cancer ([place])   remission Past Surgical History: Past Surgical History: Procedure Laterality Date  CATARACT EXTRACTION Bilateral   EYE SURGERY    HERNIA REPAIR  1985  INGUINAL HERNIA REPAIR Right 12/06/2013  Procedure: HERNIA REPAIR INGUINAL ADULT;  Surgeon: Merrie Roof, MD;  Location: [facility];  Service: General;  Laterality: Right;  INSERTION OF MESH Right 12/06/2013  Procedure: INSERTION OF MESH;  Surgeon: Merrie Roof, MD;  Location: [facility];  Service: General;  Laterality: Right;  PROSTATE SURGERY   HPI: Pt is a 87 y.o. male brought to ED by family with reports of decreased responsiveness and poor p.o. intake. Pt has not been swallowing well for about 2 weeks and has gradually worsened to where he takes very small mounts of food. He consumes very soft/liquid foods like applesauce and jello. Labs upon admission revealed pt is hypernatremic. Chest xray (08/01/21) revealed no active disease. CT head (08/01/21) revealed No evidence of acute intracranial abnormality. PMH: prostate cancer, parkinsonism/neuromuscular disorder, memory difficulty, peripheral neuropathy.  No data recorded Assessment / Plan / Recommendation CHL IP CLINICAL IMPRESSIONS 08/04/2021 Clinical Impression Pt presents with primary  oral dysphagia with mild pharyngeal dysphagia and likely cognitive component. He exhibited decreased bolus cohesion, lingual pumping and prolonged bolus hold with solids, requiring repeat liquid washes to initiate swallow, with lingual/palatal residue remaining. Pt occasionally performed spontaneous dry swallows to clear residue, but otherwise required max verbal cues and liquid washes, which did not consistently eliminate residue. Thin liquids via straw resulted in only x1 instance of flash penetration (PAS 2) with any material being ejected from airway via strength of laryngeal vestibule closure. This is considered WFL. It should be noted that a delay in swallow  initation was observed with thin liquids, which filled the pyriform sinuses and vallecular space before swallow was initated, but again, no penetration/aspiration noted except in described episode. Pt was unable to achieve A-P transit of pill whole with puree and it was ultimately manually removed from oral cavity. Recommend dysphagia 1 (puree)/thin liquid diet with family/staff to assist pt with small bites/sips, slow rate of intake, and alternation of solids with liquids. SLP to f/u for tolerance. SLP Visit Diagnosis Dysphagia, oropharyngeal phase (R13.12) Attention and concentration deficit following -- Frontal lobe and executive function deficit following -- Impact on safety and function Mild aspiration risk   CHL IP TREATMENT RECOMMENDATION 08/04/2021 Treatment Recommendations Therapy as outlined in treatment plan below   Prognosis 08/04/2021 Prognosis for Safe Diet Advancement Fair Barriers to Reach Goals Cognitive deficits Barriers/Prognosis Comment -- CHL IP DIET RECOMMENDATION 08/04/2021 SLP Diet Recommendations Dysphagia 1 (Puree) solids;Thin liquid Liquid Administration via Cup;Straw Medication Administration Crushed with puree Compensations Minimize environmental distractions;Slow rate;Small sips/bites;Follow solids with liquid Postural Changes Seated upright at 90 degrees   CHL IP OTHER RECOMMENDATIONS 08/04/2021 Recommended Consults -- Oral Care Recommendations Oral care BID;Staff/trained caregiver to provide oral care Other Recommendations --   CHL IP FOLLOW UP RECOMMENDATIONS 08/04/2021 Follow up Recommendations 24 hour supervision/assistance;Skilled Nursing facility   [facility] IP FREQUENCY AND DURATION 08/04/2021 Speech Therapy Frequency (ACUTE ONLY) min 2x/week Treatment Duration 2 weeks      CHL IP ORAL PHASE 08/04/2021 Oral Phase Impaired Oral - Pudding Teaspoon -- Oral - Pudding Cup -- Oral - Honey Teaspoon -- Oral - Honey Cup -- Oral - Nectar Teaspoon -- Oral - Nectar Cup -- Oral - Nectar Straw -- Oral - Thin  Teaspoon -- Oral - Thin Cup -- Oral - Thin Straw Lingual pumping;Holding of bolus;Lingual/palatal residue;Decreased bolus cohesion Oral - Puree Decreased bolus cohesion;Lingual/palatal residue;Holding of bolus;Lingual pumping Oral - Mech Soft -- Oral - Regular Impaired mastication;Lingual pumping;Holding of bolus;Decreased bolus cohesion;Lingual/palatal residue Oral - Multi-Consistency -- Oral - Pill Lingual pumping;Reduced posterior propulsion;Holding of bolus;Lingual/palatal residue;Decreased bolus cohesion Oral Phase - Comment --  CHL IP PHARYNGEAL PHASE 08/04/2021 Pharyngeal Phase Impaired Pharyngeal- Pudding Teaspoon -- Pharyngeal -- Pharyngeal- Pudding Cup -- Pharyngeal -- Pharyngeal- Honey Teaspoon -- Pharyngeal -- Pharyngeal- Honey Cup -- Pharyngeal -- Pharyngeal- Nectar Teaspoon -- Pharyngeal -- Pharyngeal- Nectar Cup -- Pharyngeal -- Pharyngeal- Nectar Straw -- Pharyngeal -- Pharyngeal- Thin Teaspoon -- Pharyngeal -- Pharyngeal- Thin Cup -- Pharyngeal -- Pharyngeal- Thin Straw Penetration/Aspiration during swallow;Delayed swallow initiation-pyriform sinuses;Pharyngeal residue - valleculae Pharyngeal Material enters airway, remains ABOVE vocal cords then ejected out Pharyngeal- Puree Pharyngeal residue - valleculae Pharyngeal -- Pharyngeal- Mechanical Soft -- Pharyngeal -- Pharyngeal- Regular Pharyngeal residue - valleculae Pharyngeal -- Pharyngeal- Multi-consistency -- Pharyngeal -- Pharyngeal- Pill -- Pharyngeal -- Pharyngeal Comment --  No flowsheet data found. [place] Dense, [place], [place] [facility] Office Number: [phone] Acie Fredrickson 08/04/2021, 3:31 PM  Scheduled Meds:  amLODipine  10 mg Oral Daily   apalutamide  120 mg Oral Daily   carbidopa-levodopa  1 tablet Oral TID   donepezil  5 mg Oral QHS   DULoxetine  30 mg Oral BID   enoxaparin (LOVENOX) injection  30 mg Subcutaneous Q24H   gabapentin  300 mg Oral TID   potassium chloride  40 mEq Oral  Q4H   simvastatin  40 mg Oral Daily   tamsulosin  0.4 mg Oral Daily   Continuous Infusions:  dextrose 75 mL/hr at 08/05/21 0116     LOS: 4 days     Cordelia Poche, MD [facility] 08/05/2021, 12:11 PM  If 7PM-7AM, please contact night-coverage [email or web address]

## 2021-08-05 NOTE — Progress Notes (Signed)
Occupational Therapy Treatment Patient Details Name: Casey Fisher MRN: [id number] DOB: 08/17/1928 Today's Date: 08/05/2021    History of present illness Pt is a 87 y.o. male who presented 08/01/21 with decreased oral intake and difficulty swallowing. Pt with hypernatremia, hypokalemia, and AKI. No evidence of acute intracranial abnormality. PMH: prostate cancer, parkinsonism/neuromuscular disorder/memory difficulty, peripheral neuropathy, cataract, depression, heart murmur, HTN   OT comments  Pt not progressing well towards acute OT goals. Lethargic throughout session with only minimal improvement sitting upright. Up to max A for static sitting balance. Posterior lean and decreased awareness. Decreased initiation, focused attention level, not following one step commands consistently. Decreased cognition vs lethargy vs volition? Max +2 to roll to each side and complete pericare. D/c recommendation remains appropriate.    Follow Up Recommendations  SNF    Equipment Recommendations  Other (comment) (defer to next venue)    Recommendations for Other Services      Precautions / Restrictions Precautions Precautions: Fall Precaution Comments: HOH       Mobility Bed Mobility Overal bed mobility: Needs Assistance Bed Mobility: Supine to Sit;Sit to Supine;Rolling Rolling: Max assist   Supine to sit: Max assist;+2 for physical assistance;HOB elevated Sit to supine: Max assist;+2 for physical assistance   General bed mobility comments: Not following 1 step commands consisitently. Lethargic, a bit more alert once EOB/ physical assist for all aspects. Decreased inititation    Transfers                 General transfer comment: unable to assess    Balance Overall balance assessment: Needs assistance Sitting-balance support: Bilateral upper extremity supported;Feet supported Sitting balance-Leahy Scale: Zero Sitting balance - Comments: able to achieve poor sitting balance level  but inititally needed max A for static sitting balance.                                   ADL either performed or assessed with clinical judgement   ADL Overall ADL's : Needs assistance/impaired     Grooming: Wash/dry hands;Wash/dry face;Total assistance Grooming Details (indicate cue type and reason): volition vs cogntition ve lethargy. today needed max-total A                               General ADL Comments: Pt completed bed mobility and sat EOB about 8 minutes with up to max A in static sitting. Pericare at bed level after.     Vision       Perception     Praxis      Cognition Arousal/Alertness: Lethargic Behavior During Therapy: Flat affect Overall Cognitive Status: History of cognitive impairments - at baseline                                 General Comments: Lethargic. Did not seem to be oriented to place. Focused awareness level. Did not follow 1 step commands consistently. Minimal verbalizations        Exercises     Shoulder Instructions       General Comments Casey Fisher present for most of session    Pertinent Vitals/ Pain       Pain Assessment: Faces Faces Pain Scale: No hurt  Home Living  Prior Functioning/Environment              Frequency  Min 2X/week        Progress Toward Goals  OT Goals(current goals can now be found in the care plan section)  Progress towards OT goals: Not progressing toward goals - comment  Acute Rehab OT Goals Patient Stated Goal: agreeable to session; daughter desires for pt to improve to decrease burden of care OT Goal Formulation: With patient/family Time For Goal Achievement: 08/16/21 Potential to Achieve Goals: Good ADL Goals Pt Will Perform Grooming: with supervision;bed level Pt Will Perform Upper Body Bathing: with min assist;sitting Pt Will Perform Lower Body Bathing: with max  assist;sitting/lateral leans Pt Will Transfer to Toilet: with mod assist;with +2 assist;bedside commode  Plan Discharge plan remains appropriate    Co-evaluation    PT/OT/SLP Co-Evaluation/Treatment: Yes Reason for Co-Treatment: Necessary to address cognition/behavior during functional activity;For patient/therapist safety;To address functional/ADL transfers   OT goals addressed during session: ADL's and self-care;Strengthening/ROM      AM-PAC OT "6 Clicks" Daily Activity     Outcome Measure   Help from another person eating meals?: A Lot Help from another person taking care of personal grooming?: Total Help from another person toileting, which includes using toliet, bedpan, or urinal?: Total Help from another person bathing (including washing, rinsing, drying)?: A Lot Help from another person to put on and taking off regular upper body clothing?: Total Help from another person to put on and taking off regular lower body clothing?: Total 6 Click Score: 8    End of Session    OT Visit Diagnosis: Unsteadiness on feet (R26.81);Other abnormalities of gait and mobility (R26.89);Muscle weakness (generalized) (M62.81)   Activity Tolerance Patient limited by lethargy   Patient Left in bed;with call bell/phone within reach;with bed alarm set;Other (comment) (bed in "chair" position)   Nurse Communication          Time: [id number] OT Time Calculation (min): 34 min  Charges: OT General Charges $OT Visit: 1 Visit OT Treatments $Self Care/Home Management : 8-22 mins  [name], OT Acute Rehabilitation Services Pager: [phone] Office: [phone]    Casey Fisher 08/05/2021, 1:31 PM

## 2021-08-05 NOTE — TOC Progression Note (Signed)
Transition of Care [facility]) - Progression Note    Patient Details  Name: Casey Fisher MRN: [id number] Date of Birth: [birth year]  [facility] CM/SW Contact  Reece Agar, [place] Phone Number: 08/05/2021, 9:42 AM  Clinical Narrative:    [phone]: Pt daughter has decided [facility] for SNF, CSW reached out to [place] at [facility] to update about pt/family decision. [facility] is ready for pt with updated covid test and vaccination status for bed placement.   Expected Discharge Plan: [place] Barriers to Discharge: Continued Medical Work up, SNF Pending bed offer  Expected Discharge Plan and Services Expected Discharge Plan: [place] arrangements for the past 2 months: Single Family Home                                       Social Determinants of Health (SDOH) Interventions    Readmission Risk Interventions No flowsheet data found.

## 2021-08-05 NOTE — Progress Notes (Signed)
Physical Therapy Treatment Patient Details Name: Casey Fisher MRN: [id number] DOB: 04/19/1928 Today's Date: 08/05/2021    History of Present Illness Pt is a 87 y.o. male who presented 08/01/21 with decreased oral intake and difficulty swallowing. Pt with hypernatremia, hypokalemia, and AKI. No evidence of acute intracranial abnormality. PMH: prostate cancer, parkinsonism/neuromuscular disorder/memory difficulty, peripheral neuropathy, cataract, depression, heart murmur, HTN.    PT Comments    Pt received in supine, lethargic and slow to awaken, agreeable to therapy session with max encouragement. Pt needing maxA +2 for most mobility tasks due to decreased cognition and unable to follow instructions for seated scoot transfer or standing transfers. Pt observed to be incontinent of bowels/bladder during session and needed totalA for cleanup. Pt continues to benefit from PT services to progress toward functional mobility goals. Continue to recommend SNF.   Follow Up Recommendations  SNF;Supervision/Assistance - 24 hour (if home then max [facility] services)     Equipment Recommendations  Hospital bed;Other (comment) (mechanical lift)    Recommendations for Other Services       Precautions / Restrictions Precautions Precautions: Fall Precaution Comments: HOH Restrictions Weight Bearing Restrictions: No    Mobility  Bed Mobility Overal bed mobility: Needs Assistance Bed Mobility: Supine to Sit;Sit to Supine;Rolling Rolling: Max assist   Supine to sit: Max assist;+2 for physical assistance;HOB elevated Sit to supine: Max assist;+2 for physical assistance   General bed mobility comments: Not following 1 step commands consistently. Lethargic, a bit more alert once EOB/ physical assist for all aspects. Decreased initiation and slow to process cues. Rolling multiple reps due to soiled bed during cleanup.    Transfers     General transfer comment: pt unable to follow instruction for seated  lateral scooting (totalA +2) or sit<>stand due to decreased cognition/lethargy            Balance Overall balance assessment: Needs assistance Sitting-balance support: Bilateral upper extremity supported;Feet supported Sitting balance-Leahy Scale: Zero Sitting balance - Comments: able to achieve poor sitting balance level but inititally needed max A for static sitting balance. Progressed to min guard after ~3 mins seated EOB; consistently leaning/pushing toward R side for first 3 mins (to get back into bed). Postural control: Right lateral lean;Posterior lean     Standing balance comment: UTA         Cognition Arousal/Alertness: Lethargic Behavior During Therapy: Flat affect Overall Cognitive Status: History of cognitive impairments - at baseline         General Comments: Lethargic. Did not seem to be oriented to place. Focused awareness level. Did not follow 1 step commands consistently, needs hand over hand/multimodal cues for each mobility task. Minimal verbalizations aside from request to "go back to bed" despite staff instruction that his bed was soiled.      Exercises Other Exercises Other Exercises: seated BLE AAROM: LAQ x10 reps ea Other Exercises: supine BLE AAROM: ankle pumps, heel slides x5-10 reps ea Other Exercises: seated BUE AAROM: shoulder flexion (PROM at times due to poor initiation then pt performed with AROM a few times by end of session) x10-15 reps ea    General Comments General comments (skin integrity, edema, etc.): Manya Silvas present for some mobility and reports he did assist to feed pt his breakfast; He reports pt cognition has been waxing/waning and that he was more alert earlier in day and trying to get OOB. VSS per chart review and no acute s/sx distress during session.      Pertinent Vitals/Pain Pain  Assessment: Faces Faces Pain Scale: No hurt Pain Intervention(s): Monitored during session;Repositioned     PT Goals (current goals can  now be found in the care plan section) Acute Rehab PT Goals Patient Stated Goal: pt unable to state, very drowsy PT Goal Formulation: With patient/family Time For Goal Achievement: 08/16/21 Progress towards PT goals: Not progressing toward goals - comment (too lethargic to follow commands well this date)    Frequency    Min 2X/week      PT Plan Current plan remains appropriate    Co-evaluation PT/OT/SLP Co-Evaluation/Treatment: Yes Reason for Co-Treatment: Necessary to address cognition/behavior during functional activity;For patient/therapist safety;To address functional/ADL transfers PT goals addressed during session: Mobility/safety with mobility;Balance;Strengthening/ROM       AM-PAC PT "6 Clicks" Mobility   Outcome Measure  Help needed turning from your back to your side while in a flat bed without using bedrails?: A Lot Help needed moving from lying on your back to sitting on the side of a flat bed without using bedrails?: Total Help needed moving to and from a bed to a chair (including a wheelchair)?: Total Help needed standing up from a chair using your arms (e.g., wheelchair or bedside chair)?: Total Help needed to walk in hospital room?: Total Help needed climbing 3-5 steps with a railing? : Total 6 Click Score: 7    End of Session   Activity Tolerance: Patient limited by lethargy Patient left: in bed;with call bell/phone within reach;with bed alarm set;Other (comment) (bed in chair position, heels floated) Nurse Communication: Mobility status;Need for lift equipment (lift pad in room for RN use; he would benefit from  prevena boots due to limited mobility and tending to cross his legs in supine causing pressure to skin) PT Visit Diagnosis: Unsteadiness on feet (R26.81);Muscle weakness (generalized) (M62.81);Difficulty in walking, not elsewhere classified (R26.2);History of falling (Z91.81);Other symptoms and signs involving the nervous system (R29.898)     Time:  [id number] PT Time Calculation (min) (ACUTE ONLY): 35 min  Charges:  $Therapeutic Activity: 8-22 mins                     Felicie Kocher P., PTA Acute Rehabilitation Services Pager: [phone] Office: [place] 08/05/2021, 1:59 PM

## 2021-08-06 ENCOUNTER — Telehealth: Payer: Self-pay | Admitting: Family Medicine

## 2021-08-06 ENCOUNTER — Other Ambulatory Visit: Payer: Self-pay | Admitting: Family Medicine

## 2021-08-06 DIAGNOSIS — G6289 Other specified polyneuropathies: Secondary | ICD-10-CM

## 2021-08-06 DIAGNOSIS — E43 Unspecified severe protein-calorie malnutrition: Secondary | ICD-10-CM

## 2021-08-06 DIAGNOSIS — I10 Essential (primary) hypertension: Secondary | ICD-10-CM

## 2021-08-06 LAB — BASIC METABOLIC PANEL
Anion gap: 13 (ref 5–15)
BUN: 11 mg/dL (ref 8–23)
CO2: 26 mmol/L (ref 22–32)
Calcium: 8.3 mg/dL — ABNORMAL LOW (ref 8.9–10.3)
Chloride: 106 mmol/L (ref 98–111)
Creatinine, Ser: 1.13 mg/dL (ref 0.61–1.24)
GFR, Estimated: >60 mL/min (ref >60)
Glucose, Bld: 119 mg/dL — ABNORMAL HIGH (ref 70–99)
Potassium: 2.7 mmol/L — CL (ref 3.5–5.1)
Sodium: 145 mmol/L (ref 135–145)

## 2021-08-06 LAB — CBC
HCT: 39.3 % (ref 39.0–52.0)
Hemoglobin: 12.6 g/dL — ABNORMAL LOW (ref 13.0–17.0)
MCH: 28.8 pg (ref 26.0–34.0)
MCHC: 32.1 g/dL (ref 30.0–36.0)
MCV: 89.9 fL (ref 80.0–100.0)
Platelets: 131 10*3/uL — ABNORMAL LOW (ref 150–400)
RBC: 4.37 MIL/uL (ref 4.22–5.81)
RDW: 15 % (ref 11.5–15.5)
WBC: 8.2 10*3/uL (ref 4.0–10.5)
nRBC: 0 % (ref 0.0–0.2)

## 2021-08-06 LAB — PHOSPHORUS: Phosphorus: 2.7 mg/dL (ref 2.5–4.6)

## 2021-08-06 LAB — POTASSIUM: Potassium: 2.9 mmol/L — ABNORMAL LOW (ref 3.5–5.1)

## 2021-08-06 LAB — MAGNESIUM: Magnesium: 2.2 mg/dL (ref 1.7–2.4)

## 2021-08-06 MED ORDER — POTASSIUM CHLORIDE 20 MEQ PO PACK: 40.0000 meq | PACK | Freq: Once | ORAL | Status: DC

## 2021-08-06 MED ORDER — POTASSIUM CHLORIDE 20 MEQ PO PACK
40.0000 meq | PACK | Freq: Once | ORAL | Status: AC
  Administered 2021-08-06: 40 meq via ORAL
  Filled 2021-08-06: qty 2

## 2021-08-06 MED ORDER — POTASSIUM CHLORIDE 10 MEQ/100ML IV SOLN
10.0000 meq | INTRAVENOUS | Status: AC
  Administered 2021-08-06 (×4): 10 meq via INTRAVENOUS
  Filled 2021-08-06 (×4): qty 100

## 2021-08-06 MED ORDER — POTASSIUM CHLORIDE 10 MEQ/100ML IV SOLN
10.0000 meq | Freq: Once | INTRAVENOUS | Status: AC
  Administered 2021-08-06: 10 meq via INTRAVENOUS
  Filled 2021-08-06: qty 100

## 2021-08-06 MED ORDER — ENOXAPARIN SODIUM 40 MG/0.4ML IJ SOSY: 40.0000 mg | PREFILLED_SYRINGE | INTRAMUSCULAR | Status: DC

## 2021-08-06 MED ORDER — DEXTROSE 5 % IV SOLN: INTRAVENOUS | Status: DC

## 2021-08-06 NOTE — Progress Notes (Signed)
PROGRESS NOTE    TRAD TIMPE  [id number] DOB: 04-Apr-1928 DOA: 08/01/2021 PCP: [name], MD    Brief Narrative:  Mr. Tinker was admitted to the hospital with the working diagnosis of severe hypernatremia.   87 year old male past medical history for prostate cancer, parkinsonism, neuromuscular disorder, memory impairment and peripheral neuropathy who presented with decreased p.o. intake.  Reported dysphagia for about 2 weeks, gradually worsening, along with poor oral intake.  On his initial physical examination he was afebrile, heart rate 63, respiratory rate 16, blood pressure 168/37, oxygen saturation 95%, he had dry mucous membranes, lungs are clear to auscultation bilaterally, heart S1-S2, present, tachycardic, abdomen soft, no lower extremity edema.  Patient was alert and awake.  Sodium 166, potassium 2.5, chloride 121, bicarb 35, glucose 124, BUN 23, creatinine 1.92, white count 6.1, hemoglobin 14.1, hematocrit 47.3, platelets 103. SARS COVID-19 negative.  Urinalysis specific gravity 1.016.  Urine osmolality 474, urine sodium 43. Head CT with severe chronic small vessel ischemic disease, no acute changes.  Chest radiograph with bilateral atelectasis. EKG 78 bpm, left axis deviation, left anterior fascicular block, prolonged QTC 574, sinus rhythm with PACs, negative T wave V1-V4, lead II-III-aVF  Patient was placed on D5 water along with potassium repletion.  Assessment & Plan:   Principal Problem:   Hypernatremia Active Problems:   Peripheral neuropathy   Hyperlipidemia   Memory difficulty   AKI (acute kidney injury) ([place])   Decreased oral intake   Dehydration   Parkinsonism (HCC)   Protein-calorie malnutrition, severe   Severe hypernatremia/ hypokalemia/ AKI. Likely hypovolemic due to poor oral intake, related to encephalopathy.  Na this am is 145, K is 2,7 and serum bicarbonate at 26, Cr is 1,13 and Mg 2.2, P 2,7   Plan to increase IV fluids to 100  ml of D5W and continue to encourage po intake.  Added 90 meq Kcl today. Follow up renal function and electrolytes in am.   2. HTN/ dyslipidemia. Continue blood pressure control with amlodipine. Discontinue diuretic therapy due to severe electrolyte abnormalities.  Continue with simvastatin   3. Chronic diastolic heart failure. Clinically euvolemic, continue hydration with IV fluids and blood pressure control with amlodipine,   4. Parkinsonism, neuromuscular disorder, memory loss, and peripheral neuropathy. Severe protein calorie malnutrition  Continue medical therapy with aricept and sinement.  On duloxetine (not able to crash med) Aspiration precautions with dysphagia 1 diet.  Continue with gabapentin 300 mg tid.   5. Hx of prostate cancer, thrombocytopenia.  On tamsulosin.  Follow as outpatient.   Patient continue to be at high risk for worsening hypernatremia.   Status is: Inpatient  Remains inpatient appropriate because:Inpatient level of care appropriate due to severity of illness  Dispo: The patient is from: SNF              Anticipated d/c is to: SNF              Patient currently is not medically stable to d/c.   Difficult to place patient No   DVT prophylaxis: Enoxaparin   Code Status:   DNR   Family Communication:   No family at the bedside      Nutrition Status: Nutrition Problem: Severe Malnutrition Etiology: chronic illness (Parkinson's disease) Signs/Symptoms: moderate fat depletion, severe fat depletion, moderate muscle depletion, severe muscle depletion, percent weight loss Percent weight loss: 9 % Interventions: Ensure Enlive (each supplement provides 350kcal and 20 grams of protein), Magic cup, MVI  Subjective: Patient continue to be very weak and deconditioned, not in apparent pain or dyspnea, limited history due to cognitive impairment.   Objective: Vitals:   08/05/21 1725 08/05/21 1951 08/06/21 0557 08/06/21 0830  BP: 137/82 (!) 158/91 (!)  133/92 (!) 162/101  Pulse: 80 84 98 92  Resp: '18 20 20 18  '$ Temp:  98.7 F (37.1 C) 98.1 F (36.7 C) 98.8 F (37.1 C)  TempSrc:  Oral Oral Oral  SpO2: 93% 94% 95% 96%  Weight:      Height:        Intake/Output Summary (Last 24 hours) at 08/06/2021 0846 Last data filed at 08/06/2021 0400 Gross per 24 hour  Intake 1673.68 ml  Output 700 ml  Net 973.68 ml   Filed Weights   08/03/21 0048 08/04/21 0355 08/05/21 0329  Weight: 69.4 kg 71.4 kg 73.2 kg    Examination:   General: Not in pain or dyspnea., deconditioned and ill looking appearing,  Neurology: Awake and alert, non focal  E ENT: mild pallor, no icterus, oral mucosa moist Cardiovascular: No JVD. S1-S2 present, rhythmic, no gallops, rubs, or murmurs. Trace non potting bilateral lower extremity edema. Pulmonary: positive breath sounds bilaterally,with no wheezing, rhonchi or rales. Gastrointestinal. Abdomen soft and non tender Skin. No rashes Musculoskeletal: no joint deformities     Data Reviewed: I have personally reviewed following labs and imaging studies  CBC: Recent Labs  Lab 08/01/21 1500 08/02/21 0350 08/03/21 0603 08/04/21 0311 08/05/21 0245 08/06/21 0410  WBC 6.1 6.4 5.1 5.5 6.9 8.2  NEUTROABS 4.6  --   --   --   --   --   HGB 14.1 13.6 12.4* 12.1* 12.3* 12.6*  HCT 47.3 44.1 40.7 38.7* 39.2 39.3  MCV 95.9 92.3 92.7 92.1 90.1 89.9  PLT 103* 98* 96* 95* 107* [id number]*   Basic Metabolic Panel: Recent Labs  Lab 08/02/21 0350 08/02/21 1300 08/03/21 0603 08/04/21 0311 08/04/21 1640 08/05/21 0245 08/05/21 1622 08/06/21 0410  NA 159*   < > 154* 148* 147* 145 143 145  K 3.2*   < > 2.9* 2.8* 2.9* 3.2* 3.2* 2.7*  CL 118*   < > 116* 112* 110 108 106 106  CO2 28   < > '29 30 29 27 26 26  '$ GLUCOSE 135*   < > 132* 143* 139* 150* 125* 119*  BUN 18   < > '14 15 14 12 13 11  '$ CREATININE 1.61*   < > 1.34* 1.60* 1.43* 1.26* 1.19 1.13  CALCIUM 7.9*   < > 8.1* 7.8* 8.0* 8.2* 8.2* 8.3*  MG 2.2  --  2.3 2.3  --  2.1   --  2.2  PHOS  --   --  2.8 3.2  --  2.6  --  2.7   < > = values in this interval not displayed.   GFR: Estimated Creatinine Clearance: 44.1 mL/min (by C-G formula based on SCr of 1.13 mg/dL). Liver Function Tests: Recent Labs  Lab 08/01/21 1500  AST 30  ALT 15  ALKPHOS 82  BILITOT 1.1  PROT 7.2  ALBUMIN 3.4*   Recent Labs  Lab 08/01/21 1500  LIPASE 49   Recent Labs  Lab 08/02/21 0350  AMMONIA 26   Coagulation Profile: No results for input(s): INR, PROTIME in the last 168 hours. Cardiac Enzymes: No results for input(s): CKTOTAL, CKMB, CKMBINDEX, TROPONINI in the last 168 hours. BNP (last 3 results) No results for input(s): PROBNP in the last 8760 hours.  HbA1C: No results for input(s): HGBA1C in the last 72 hours. CBG: No results for input(s): GLUCAP in the last 168 hours. Lipid Profile: No results for input(s): CHOL, HDL, LDLCALC, TRIG, CHOLHDL, LDLDIRECT in the last 72 hours. Thyroid Function Tests: No results for input(s): TSH, T4TOTAL, FREET4, T3FREE, THYROIDAB in the last 72 hours. Anemia Panel: No results for input(s): VITAMINB12, FOLATE, FERRITIN, TIBC, IRON, RETICCTPCT in the last 72 hours.    Radiology Studies: I have reviewed all of the imaging during this hospital visit personally     Scheduled Meds:  amLODipine  10 mg Oral Daily   carbidopa-levodopa  1 tablet Oral TID   donepezil  5 mg Oral QHS   DULoxetine  30 mg Oral BID   [START ON 08/07/2021] enoxaparin (LOVENOX) injection  40 mg Subcutaneous Q24H   gabapentin  300 mg Oral Q8H   potassium chloride  40 mEq Oral Once   simvastatin  40 mg Oral Daily   tamsulosin  0.4 mg Oral Daily   Continuous Infusions:  potassium chloride       LOS: 5 days        Owin Vignola Gerome Apley, MD

## 2021-08-06 NOTE — Plan of Care (Signed)

## 2021-08-06 NOTE — Progress Notes (Signed)
  Speech Language Pathology Treatment: Dysphagia  Patient Details Name: NOBORU BIDINGER MRN: [id number] DOB: 09/03/1928 Today's Date: 08/06/2021 Time: 6789-3810 SLP Time Calculation (min) (ACUTE ONLY): 8 min  Assessment / Plan / Recommendation Clinical Impression  Pt presents with improved alertness this date, however continues to require cues to maintain attention to PO intake due to cognitive deficits. He consumed bites of puree and sips of thin liquids via straw demonstrating frequent belching, however no overt s/sx of aspiration noted. Oral holding was variable throughout trials and improved with alternation of solids with liquids and verbal cues to swallow. RN reports that pt consumed pm meal without overt s/sx of aspiration and oral holding was minimal. Pt has met all goals for SLP POC and safely consumes dysphagia 1, thin liquid diet with full supervision and assist from staff/family. All education has been completed. No further SLP f/u warranted. Will s/o.   HPI HPI: Pt is a 87 y.o. male brought to ED by family with reports of decreased responsiveness and poor p.o. intake. Pt has not been swallowing well for about 2 weeks and has gradually worsened to where he takes very small mounts of food. He consumes very soft/liquid foods like applesauce and jello. Labs upon admission revealed pt is hypernatremic. Chest xray (08/01/21) revealed no active disease. CT head (08/01/21) revealed No evidence of acute intracranial abnormality. PMH: prostate cancer, parkinsonism/neuromuscular disorder, memory difficulty, peripheral neuropathy.      SLP Plan  All goals met;Discharge SLP treatment due to (comment)       Recommendations  Diet recommendations: Dysphagia 1 (puree);Thin liquid Liquids provided via: Cup;Straw Medication Administration: Crushed with puree Supervision: Staff to assist with self feeding;Full supervision/cueing for compensatory strategies Compensations: Minimize environmental  distractions;Slow rate;Small sips/bites;Follow solids with liquid Postural Changes and/or Swallow Maneuvers: Seated upright 90 degrees                Oral Care Recommendations: Oral care BID;Staff/trained caregiver to provide oral care Follow up Recommendations: 24 hour supervision/assistance;Skilled Nursing facility SLP Visit Diagnosis: Dysphagia, oropharyngeal phase (R13.12) Plan: All goals met;Discharge SLP treatment due to (comment)       [place], [place], [place] Office Number: [phone]  Acie Fredrickson 08/06/2021, 2:26 PM

## 2021-08-06 NOTE — Telephone Encounter (Signed)
Medication is not listed on current medication list. Please advise.

## 2021-08-06 NOTE — Progress Notes (Signed)
Nutrition Follow-up  DOCUMENTATION CODES:   Severe malnutrition in context of chronic illness  INTERVENTION:   -Continue Magic cup TID with meals, each supplement provides 290 kcal and 9 grams of protein  -Feeding assistance with meals  NUTRITION DIAGNOSIS:   Severe Malnutrition related to chronic illness (Parkinson's disease) as evidenced by moderate fat depletion, severe fat depletion, moderate muscle depletion, severe muscle depletion, percent weight loss.  [place]  GOAL:   Patient will meet greater than or equal to 90% of their needs  Progressing  MONITOR:   PO intake, Supplement acceptance, Diet advancement, Labs, Weight trends, Skin, I & O's  REASON FOR ASSESSMENT:   Consult Assessment of nutrition requirement/status  ASSESSMENT:   Casey Fisher is a 87 y.o. male with medical history significant of prostate cancer, parkinsonism/neuromuscular disorder/memory difficulty, peripheral neuropathy. Patient has not been swallowing well for about 2 weeks and has gradually worsened to where he takes very small mounts of food. They tried calling his PCP but earliest visit was 8/31. He will intake very soft/liquid foods like applesauce and jello, but very small portions.  8/6- s/p BSE- advanced to dysphagia 2 diet with thin liquids 8/8- s/p BSE- downgraded to dysphagia 1 diet with thin liquids; s/p MBSS- dysphagia 1 diet with thin liquids  Reviewed I/O's: +934 ml x 24 hours and +6 L since admission  UOP: 700 ml x 24 hours  Pt unavailable at time of visit.   Pt with very poor oral intake. Noted meal completions 0%.   Palliative care team following for further goals of care discussions. Would not recommend feeding tube in a patient with advanced and age dementia.   Medications reviewed and include sinemet.   Per TOC notes, plan to d/c to SNF once medically stable.   Labs reviewed: K: 2.9.   Diet Order:   Diet Order             DIET - DYS 1 Room service  appropriate? No; Fluid consistency: Thin  Diet effective now                   EDUCATION NEEDS:   Education needs have been addressed  Skin:  Skin Assessment: Reviewed RN Assessment  Last BM:  08/03/21  Height:   Ht Readings from Last 1 Encounters:  08/01/21 '6\' 2"'$  (1.88 m)    Weight:   Wt Readings from Last 1 Encounters:  08/05/21 73.2 kg    Ideal Body Weight:  86.4 kg  BMI:  Body mass index is 20.72 kg/m.  Estimated Nutritional Needs:   Kcal:  2100-2300  Protein:  95-110 grams  Fluid:  > 2 L    Loistine Chance, RD, LDN, [place] Registered Dietitian II Certified Diabetes Care and Education Specialist Please refer to [facility] for RD and/or RD on-call/weekend/after hours pager

## 2021-08-07 ENCOUNTER — Telehealth: Payer: Medicare Other | Admitting: Family Medicine

## 2021-08-07 DIAGNOSIS — Z681 Body mass index (BMI) 19 or less, adult: Secondary | ICD-10-CM | POA: Diagnosis not present

## 2021-08-07 DIAGNOSIS — R413 Other amnesia: Secondary | ICD-10-CM

## 2021-08-07 DIAGNOSIS — E43 Unspecified severe protein-calorie malnutrition: Secondary | ICD-10-CM | POA: Diagnosis present

## 2021-08-07 DIAGNOSIS — D696 Thrombocytopenia, unspecified: Secondary | ICD-10-CM | POA: Diagnosis not present

## 2021-08-07 DIAGNOSIS — C61 Malignant neoplasm of prostate: Secondary | ICD-10-CM | POA: Diagnosis not present

## 2021-08-07 DIAGNOSIS — R630 Anorexia: Secondary | ICD-10-CM | POA: Diagnosis not present

## 2021-08-07 DIAGNOSIS — N179 Acute kidney failure, unspecified: Secondary | ICD-10-CM | POA: Diagnosis present

## 2021-08-07 DIAGNOSIS — R0600 Dyspnea, unspecified: Secondary | ICD-10-CM | POA: Diagnosis not present

## 2021-08-07 DIAGNOSIS — G3183 Dementia with Lewy bodies: Secondary | ICD-10-CM | POA: Diagnosis present

## 2021-08-07 DIAGNOSIS — F0390 Unspecified dementia without behavioral disturbance: Secondary | ICD-10-CM | POA: Diagnosis not present

## 2021-08-07 DIAGNOSIS — F0280 Dementia in other diseases classified elsewhere without behavioral disturbance: Secondary | ICD-10-CM | POA: Diagnosis present

## 2021-08-07 DIAGNOSIS — M6281 Muscle weakness (generalized): Secondary | ICD-10-CM | POA: Diagnosis not present

## 2021-08-07 DIAGNOSIS — R4182 Altered mental status, unspecified: Secondary | ICD-10-CM | POA: Diagnosis not present

## 2021-08-07 DIAGNOSIS — I82451 Acute embolism and thrombosis of right peroneal vein: Secondary | ICD-10-CM | POA: Diagnosis present

## 2021-08-07 DIAGNOSIS — I82411 Acute embolism and thrombosis of right femoral vein: Secondary | ICD-10-CM | POA: Diagnosis present

## 2021-08-07 DIAGNOSIS — I6789 Other cerebrovascular disease: Secondary | ICD-10-CM | POA: Diagnosis not present

## 2021-08-07 DIAGNOSIS — I959 Hypotension, unspecified: Secondary | ICD-10-CM | POA: Diagnosis not present

## 2021-08-07 DIAGNOSIS — I82511 Chronic embolism and thrombosis of right femoral vein: Secondary | ICD-10-CM | POA: Diagnosis present

## 2021-08-07 DIAGNOSIS — G20 Parkinson's disease: Secondary | ICD-10-CM | POA: Diagnosis not present

## 2021-08-07 DIAGNOSIS — I82431 Acute embolism and thrombosis of right popliteal vein: Secondary | ICD-10-CM | POA: Diagnosis present

## 2021-08-07 DIAGNOSIS — F32A Depression, unspecified: Secondary | ICD-10-CM | POA: Diagnosis present

## 2021-08-07 DIAGNOSIS — E860 Dehydration: Secondary | ICD-10-CM | POA: Diagnosis present

## 2021-08-07 DIAGNOSIS — G9341 Metabolic encephalopathy: Secondary | ICD-10-CM | POA: Diagnosis present

## 2021-08-07 DIAGNOSIS — D649 Anemia, unspecified: Secondary | ICD-10-CM | POA: Diagnosis present

## 2021-08-07 DIAGNOSIS — I82521 Chronic embolism and thrombosis of right iliac vein: Secondary | ICD-10-CM | POA: Diagnosis present

## 2021-08-07 DIAGNOSIS — Z7401 Bed confinement status: Secondary | ICD-10-CM | POA: Diagnosis not present

## 2021-08-07 DIAGNOSIS — Z515 Encounter for palliative care: Secondary | ICD-10-CM | POA: Diagnosis not present

## 2021-08-07 DIAGNOSIS — R140 Abdominal distension (gaseous): Secondary | ICD-10-CM | POA: Diagnosis not present

## 2021-08-07 DIAGNOSIS — G609 Hereditary and idiopathic neuropathy, unspecified: Secondary | ICD-10-CM | POA: Diagnosis not present

## 2021-08-07 DIAGNOSIS — E870 Hyperosmolality and hypernatremia: Secondary | ICD-10-CM | POA: Diagnosis present

## 2021-08-07 DIAGNOSIS — I110 Hypertensive heart disease with heart failure: Secondary | ICD-10-CM | POA: Diagnosis present

## 2021-08-07 DIAGNOSIS — Z20822 Contact with and (suspected) exposure to covid-19: Secondary | ICD-10-CM | POA: Diagnosis present

## 2021-08-07 DIAGNOSIS — R627 Adult failure to thrive: Secondary | ICD-10-CM | POA: Diagnosis present

## 2021-08-07 DIAGNOSIS — M1711 Unilateral primary osteoarthritis, right knee: Secondary | ICD-10-CM | POA: Diagnosis not present

## 2021-08-07 DIAGNOSIS — Z7189 Other specified counseling: Secondary | ICD-10-CM | POA: Diagnosis not present

## 2021-08-07 DIAGNOSIS — R5381 Other malaise: Secondary | ICD-10-CM | POA: Diagnosis not present

## 2021-08-07 DIAGNOSIS — Z66 Do not resuscitate: Secondary | ICD-10-CM | POA: Diagnosis present

## 2021-08-07 DIAGNOSIS — N400 Enlarged prostate without lower urinary tract symptoms: Secondary | ICD-10-CM | POA: Diagnosis not present

## 2021-08-07 DIAGNOSIS — K6389 Other specified diseases of intestine: Secondary | ICD-10-CM | POA: Diagnosis not present

## 2021-08-07 DIAGNOSIS — I5032 Chronic diastolic (congestive) heart failure: Secondary | ICD-10-CM | POA: Diagnosis present

## 2021-08-07 DIAGNOSIS — I82441 Acute embolism and thrombosis of right tibial vein: Secondary | ICD-10-CM | POA: Diagnosis present

## 2021-08-07 DIAGNOSIS — Z8546 Personal history of malignant neoplasm of prostate: Secondary | ICD-10-CM | POA: Diagnosis not present

## 2021-08-07 DIAGNOSIS — R1312 Dysphagia, oropharyngeal phase: Secondary | ICD-10-CM | POA: Diagnosis not present

## 2021-08-07 DIAGNOSIS — E785 Hyperlipidemia, unspecified: Secondary | ICD-10-CM | POA: Diagnosis not present

## 2021-08-07 DIAGNOSIS — E876 Hypokalemia: Secondary | ICD-10-CM | POA: Diagnosis not present

## 2021-08-07 DIAGNOSIS — R7889 Finding of other specified substances, not normally found in blood: Secondary | ICD-10-CM | POA: Diagnosis not present

## 2021-08-07 DIAGNOSIS — I10 Essential (primary) hypertension: Secondary | ICD-10-CM | POA: Diagnosis not present

## 2021-08-07 DIAGNOSIS — R638 Other symptoms and signs concerning food and fluid intake: Secondary | ICD-10-CM | POA: Diagnosis not present

## 2021-08-07 DIAGNOSIS — J9811 Atelectasis: Secondary | ICD-10-CM | POA: Diagnosis not present

## 2021-08-07 DIAGNOSIS — N390 Urinary tract infection, site not specified: Secondary | ICD-10-CM | POA: Diagnosis present

## 2021-08-07 DIAGNOSIS — R5383 Other fatigue: Secondary | ICD-10-CM | POA: Diagnosis not present

## 2021-08-07 LAB — RESP PANEL BY RT-PCR (FLU A&B, COVID) ARPGX2
Influenza A by PCR: NEGATIVE
Influenza B by PCR: NEGATIVE
SARS Coronavirus 2 by RT PCR: NEGATIVE

## 2021-08-07 LAB — BASIC METABOLIC PANEL
Anion gap: 7 (ref 5–15)
BUN: 10 mg/dL (ref 8–23)
CO2: 26 mmol/L (ref 22–32)
Calcium: 8.2 mg/dL — ABNORMAL LOW (ref 8.9–10.3)
Chloride: 111 mmol/L (ref 98–111)
Creatinine, Ser: 1.11 mg/dL (ref 0.61–1.24)
GFR, Estimated: >60 mL/min (ref >60)
Glucose, Bld: 150 mg/dL — ABNORMAL HIGH (ref 70–99)
Potassium: 3.1 mmol/L — ABNORMAL LOW (ref 3.5–5.1)
Sodium: 144 mmol/L (ref 135–145)

## 2021-08-07 LAB — PHOSPHORUS: Phosphorus: 2.5 mg/dL (ref 2.5–4.6)

## 2021-08-07 MED ORDER — ACETAMINOPHEN 325 MG PO TABS: 650.0000 mg | ORAL_TABLET | Freq: Four times a day (QID) | ORAL | Status: DC | PRN

## 2021-08-07 MED ORDER — ERLEADA 60 MG PO TABS: 120.0000 mg | ORAL_TABLET | Freq: Every day | ORAL | Status: DC

## 2021-08-07 MED ORDER — GABAPENTIN 250 MG/5ML PO SOLN: 300.0000 mg | Freq: Three times a day (TID) | ORAL | 0 refills | Status: DC

## 2021-08-07 MED ORDER — POTASSIUM CHLORIDE CRYS ER 20 MEQ PO TBCR
40.0000 meq | EXTENDED_RELEASE_TABLET | ORAL | Status: AC
  Administered 2021-08-07 (×2): 40 meq via ORAL
  Filled 2021-08-07: qty 2

## 2021-08-07 MED ORDER — AMMONIUM LACTATE 12 % EX LOTN: 1.0000 "application " | TOPICAL_LOTION | Freq: Two times a day (BID) | CUTANEOUS | Status: DC | PRN

## 2021-08-07 MED ORDER — AMLODIPINE BESYLATE 10 MG PO TABS: 10.0000 mg | ORAL_TABLET | Freq: Every day | ORAL | 0 refills | Status: DC

## 2021-08-07 NOTE — Discharge Summary (Signed)
Physician Discharge Summary  Casey Fisher MGQ:[id number] DOB: 11/30/1928 DOA: 08/01/2021  PCP: [name], MD  Admit date: 08/01/2021 Discharge date: 08/07/2021  Admitted From: Home  Disposition:   SNF with palliative care   Recommendations for Outpatient Follow-up and new medication changes:  Follow up with Dr. Carlota Raspberry in 7 to 10 days.  Continue to encourage po intake. Follow up renal panel and electrolytes in 7 days.   Home Health: na   Equipment/Devices: na    Discharge Condition: stable  CODE STATUS: DNR   Diet recommendation:   Diet recommendations: Dysphagia 1 (puree);Thin liquid Liquids provided via: Cup;Straw Medication Administration: Crushed with puree Supervision: Staff to assist with self feeding;Full supervision/cueing for compensatory strategies Compensations: Minimize environmental distractions;Slow rate;Small sips/bites;Follow solids with liquid Postural Changes and/or Swallow Maneuvers: Seated upright 90 degrees                         Oral Care Recommendations: Oral care BID;Staff/trained caregiver to provide oral care Follow up Recommendations: 24 hour supervision/assistance;Skilled Nursing facility SLP Visit Diagnosis: Dysphagia, oropharyngeal phase (R13.12) Plan: All goals met;Discharge SLP treatment due to (comment)      Brief/Interim Summary: Casey Fisher was admitted to the hospital with the working diagnosis of severe hypernatremia.    87 year old male past medical history for prostate cancer, parkinsonism, neuromuscular disorder, memory impairment and peripheral neuropathy who presented with decreased p.o. intake.  Reported dysphagia for about 2 weeks, gradually worsening, along with poor oral intake.  On his initial physical examination he was afebrile, heart rate 63, respiratory rate 16, blood pressure 168/37, oxygen saturation 95%, he had dry mucous membranes, lungs were clear to auscultation bilaterally, heart S1-S2, present,  tachycardic, abdomen soft, no lower extremity edema.  Patient was alert and awake.   Sodium 166, potassium 2.5, chloride 121, bicarb 35, glucose 124, BUN 23, creatinine 1.92, white count 6.1, hemoglobin 14.1, hematocrit 47.3, platelets 103. SARS COVID-19 negative.   Urinalysis specific gravity 1.016.  Urine osmolality 474, urine sodium 43. Head CT with severe chronic small vessel ischemic disease, no acute changes.   Chest radiograph with bilateral atelectasis. EKG 78 bpm, left axis deviation, left anterior fascicular block, prolonged QTC 574, sinus rhythm with PACs, negative T wave V1-V4, lead II-III-aVF   Patient was placed on D5 water along with potassium repletion. Electrolytes were corrected. Speech therapy recommended dysphagia 1 diet with aspiration precautions.  Patient very debilitated, plan to continue palliative care at SNF.   1.  Severe hypernatremia, hypokalemia.  Acute kidney injury.  Patient was admitted to the medical ward, he was placed on a remote telemetry monitoring. Hypovolemic hypernatremia likely related to poor oral intake, cognitive impairment. Patient received intravenous fluids with D5 water along with electrolyte repletion.  At discharge serum creatinine 1.11, sodium 144, potassium 3.1, bicarb 26, chloride 111. Patient will get further potassium repletion before discharge, follow-up kidney function next week. Continue to encourage p.o. intake, aspiration precautions.  2.  Hypertension.  Dyslipidemia.  Continue blood pressure control with amlodipine. Continue simvastatin. Avoid diuretics to prevent further electrolyte disturbances.  3.  Chronic diastolic heart failure.  No signs of acute exacerbation.  Continue blood pressure control with amlodipine.  4.  Parkinsonism, neuromuscular disorder, memory loss, peripheral neuropathy. Severe calorie protein malnutrition. Patient had neurochecks per unit protocol, no agitation.  Continue Aricept and Sinemet.   Duloxetine and gabapentin. Speech therapy has recommended aspiration precautions with dysphagia 1 diet.  5.  History of prostate  cancer.  Thrombocytopenia. Continue outpatient follow-up.  No urinary retention.  Continue tamsulosin. Continue taking erleda and leuproldide   Discharge Diagnoses:  Principal Problem:   Hypernatremia Active Problems:   Peripheral neuropathy   Hyperlipidemia   Memory difficulty   AKI (acute kidney injury) ([place])   Decreased oral intake   Dehydration   Parkinsonism (HCC)   Protein-calorie malnutrition, severe    Discharge Instructions   Allergies as of 08/07/2021       Reactions   Lyrica [pregabalin] Other (See Comments)   "constipation"        Medication List     STOP taking these medications    gabapentin 600 MG tablet Commonly known as: NEURONTIN Replaced by: gabapentin 250 MG/5ML solution   hydrochlorothiazide 12.5 MG tablet Commonly known as: HYDRODIURIL       TAKE these medications    acetaminophen 325 MG tablet Commonly known as: TYLENOL Take 2 tablets (650 mg total) by mouth every 6 (six) hours as needed for mild pain or moderate pain.   amLODipine 10 MG tablet Commonly known as: NORVASC Take 1 tablet (10 mg total) by mouth daily.   ammonium lactate 12 % lotion Commonly known as: AmLactin Apply 1 application topically 2 (two) times daily as needed for dry skin.   CALCIUM 600 + D PO Take 1 tablet by mouth daily.   carbidopa-levodopa 25-250 MG tablet Commonly known as: Sinemet Take 1 tablet by mouth 3 (three) times daily.   donepezil 5 MG tablet Commonly known as: ARICEPT Take 1 tablet (5 mg total) by mouth at bedtime.   DULoxetine 30 MG capsule Commonly known as: CYMBALTA Take 1 capsule (30 mg total) by mouth 2 (two) times daily.   Erleada 60 MG tablet Generic drug: apalutamide Take 120 mg by mouth daily.   gabapentin 250 MG/5ML solution Commonly known as: NEURONTIN Take 6 mLs (300 mg total) by mouth  every 8 (eight) hours. Replaces: gabapentin 600 MG tablet   LUPRON IJ Inject 1 application as directed every 6 (six) months.   simvastatin 40 MG tablet Commonly known as: ZOCOR TAKE 1 TABLET(40 MG) BY MOUTH DAILY   tamsulosin 0.4 MG Caps capsule Commonly known as: FLOMAX Take 0.4 mg daily by mouth. Reported on 05/14/2016        Allergies  Allergen Reactions   Lyrica [Pregabalin] Other (See Comments)    "constipation"    Consultations: Palliative care    Procedures/Studies: CT HEAD WO CONTRAST (5MM)  Result Date: 08/01/2021 CLINICAL DATA:  Parkinson's disease.  Generalized weakness. EXAM: CT HEAD WITHOUT CONTRAST TECHNIQUE: Contiguous axial images were obtained from the base of the skull through the vertex without intravenous contrast. COMPARISON:  06/07/2021 FINDINGS: Brain: There is no evidence of an acute infarct, intracranial hemorrhage, mass, midline shift, or extra-axial fluid collection. Confluent hypodensities in the cerebral white matter bilaterally are unchanged and nonspecific but compatible with severe chronic small vessel ischemic disease. A chronic infarct is again noted involving the right basal ganglia and internal capsule. There is mild-to-moderate cerebral atrophy. Vascular: Calcified atherosclerosis at the skull base. No hyperdense vessel. Skull: No fracture or suspicious osseous lesion. Sinuses/Orbits: Paranasal sinuses and mastoid air cells are clear. Bilateral cataract extraction. Other: None. IMPRESSION: 1. No evidence of acute intracranial abnormality. 2. Severe chronic small vessel ischemic disease. Electronically Signed   By: Logan Bores M.D.   On: 08/01/2021 16:29   DG Chest Portable 1 View  Result Date: 08/01/2021 CLINICAL DATA:  Weakness EXAM: PORTABLE CHEST 1  VIEW COMPARISON:  06/07/2021, CT 10/29/2016 FINDINGS: Right lung grossly clear. Hazy atelectasis at the left base. No pleural effusion. Normal cardiac size. No pneumothorax. IMPRESSION: No active  disease. Mild chronic elevation left diaphragm with probable scarring and atelectasis Electronically Signed   By: Donavan Foil M.D.   On: 08/01/2021 16:21   DG Swallowing Func-Speech Pathology  Result Date: 08/04/2021 Formatting of this result is different from the original. Objective Swallowing Evaluation: Type of Study: MBS-Modified Barium Swallow Study  Patient Details Name: DANA DORNER MRN: [id number] Date of Birth: September 25, 1928 Today's Date: 08/04/2021 Time: SLP Start Time (ACUTE ONLY): 1356 -SLP Stop Time (ACUTE ONLY): 1410 SLP Time Calculation (min) (ACUTE ONLY): 14 min Past Medical History: Past Medical History: Diagnosis Date  Abnormality of gait 03/24/2016  Anxiety   Cancer ([place])   Cataract   Depression   Heart murmur   Hereditary and idiopathic peripheral neuropathy 03/24/2016  Hypertension   Memory difficulty 03/24/2016  Neuromuscular disorder ([place])   Neuropathy   Parkinson's disease ([place]) 05/07/2021  Prostate cancer ([place])   remission Past Surgical History: Past Surgical History: Procedure Laterality Date  CATARACT EXTRACTION Bilateral   EYE SURGERY    HERNIA REPAIR  1985  INGUINAL HERNIA REPAIR Right 12/06/2013  Procedure: HERNIA REPAIR INGUINAL ADULT;  Surgeon: Merrie Roof, MD;  Location: [facility];  Service: General;  Laterality: Right;  INSERTION OF MESH Right 12/06/2013  Procedure: INSERTION OF MESH;  Surgeon: Merrie Roof, MD;  Location: [facility];  Service: General;  Laterality: Right;  PROSTATE SURGERY   HPI: Pt is a 87 y.o. male brought to ED by family with reports of decreased responsiveness and poor p.o. intake. Pt has not been swallowing well for about 2 weeks and has gradually worsened to where he takes very small mounts of food. He consumes very soft/liquid foods like applesauce and jello. Labs upon admission revealed pt is hypernatremic. Chest xray (08/01/21) revealed no active disease. CT head (08/01/21) revealed No evidence of acute intracranial abnormality. PMH: prostate cancer,  parkinsonism/neuromuscular disorder, memory difficulty, peripheral neuropathy.  No data recorded Assessment / Plan / Recommendation CHL IP CLINICAL IMPRESSIONS 08/04/2021 Clinical Impression Pt presents with primary oral dysphagia with mild pharyngeal dysphagia and likely cognitive component. He exhibited decreased bolus cohesion, lingual pumping and prolonged bolus hold with solids, requiring repeat liquid washes to initiate swallow, with lingual/palatal residue remaining. Pt occasionally performed spontaneous dry swallows to clear residue, but otherwise required max verbal cues and liquid washes, which did not consistently eliminate residue. Thin liquids via straw resulted in only x1 instance of flash penetration (PAS 2) with any material being ejected from airway via strength of laryngeal vestibule closure. This is considered WFL. It should be noted that a delay in swallow initation was observed with thin liquids, which filled the pyriform sinuses and vallecular space before swallow was initated, but again, no penetration/aspiration noted except in described episode. Pt was unable to achieve A-P transit of pill whole with puree and it was ultimately manually removed from oral cavity. Recommend dysphagia 1 (puree)/thin liquid diet with family/staff to assist pt with small bites/sips, slow rate of intake, and alternation of solids with liquids. SLP to f/u for tolerance. SLP Visit Diagnosis Dysphagia, oropharyngeal phase (R13.12) Attention and concentration deficit following -- Frontal lobe and executive function deficit following -- Impact on safety and function Mild aspiration risk   CHL IP TREATMENT RECOMMENDATION 08/04/2021 Treatment Recommendations Therapy as outlined in treatment plan below   Prognosis  08/04/2021 Prognosis for Safe Diet Advancement Fair Barriers to Reach Goals Cognitive deficits Barriers/Prognosis Comment -- CHL IP DIET RECOMMENDATION 08/04/2021 SLP Diet Recommendations Dysphagia 1 (Puree) solids;Thin  liquid Liquid Administration via Cup;Straw Medication Administration Crushed with puree Compensations Minimize environmental distractions;Slow rate;Small sips/bites;Follow solids with liquid Postural Changes Seated upright at 90 degrees   CHL IP OTHER RECOMMENDATIONS 08/04/2021 Recommended Consults -- Oral Care Recommendations Oral care BID;Staff/trained caregiver to provide oral care Other Recommendations --   CHL IP FOLLOW UP RECOMMENDATIONS 08/04/2021 Follow up Recommendations 24 hour supervision/assistance;Skilled Nursing facility   [facility] IP FREQUENCY AND DURATION 08/04/2021 Speech Therapy Frequency (ACUTE ONLY) min 2x/week Treatment Duration 2 weeks      CHL IP ORAL PHASE 08/04/2021 Oral Phase Impaired Oral - Pudding Teaspoon -- Oral - Pudding Cup -- Oral - Honey Teaspoon -- Oral - Honey Cup -- Oral - Nectar Teaspoon -- Oral - Nectar Cup -- Oral - Nectar Straw -- Oral - Thin Teaspoon -- Oral - Thin Cup -- Oral - Thin Straw Lingual pumping;Holding of bolus;Lingual/palatal residue;Decreased bolus cohesion Oral - Puree Decreased bolus cohesion;Lingual/palatal residue;Holding of bolus;Lingual pumping Oral - Mech Soft -- Oral - Regular Impaired mastication;Lingual pumping;Holding of bolus;Decreased bolus cohesion;Lingual/palatal residue Oral - Multi-Consistency -- Oral - Pill Lingual pumping;Reduced posterior propulsion;Holding of bolus;Lingual/palatal residue;Decreased bolus cohesion Oral Phase - Comment --  CHL IP PHARYNGEAL PHASE 08/04/2021 Pharyngeal Phase Impaired Pharyngeal- Pudding Teaspoon -- Pharyngeal -- Pharyngeal- Pudding Cup -- Pharyngeal -- Pharyngeal- Honey Teaspoon -- Pharyngeal -- Pharyngeal- Honey Cup -- Pharyngeal -- Pharyngeal- Nectar Teaspoon -- Pharyngeal -- Pharyngeal- Nectar Cup -- Pharyngeal -- Pharyngeal- Nectar Straw -- Pharyngeal -- Pharyngeal- Thin Teaspoon -- Pharyngeal -- Pharyngeal- Thin Cup -- Pharyngeal -- Pharyngeal- Thin Straw Penetration/Aspiration during swallow;Delayed swallow  initiation-pyriform sinuses;Pharyngeal residue - valleculae Pharyngeal Material enters airway, remains ABOVE vocal cords then ejected out Pharyngeal- Puree Pharyngeal residue - valleculae Pharyngeal -- Pharyngeal- Mechanical Soft -- Pharyngeal -- Pharyngeal- Regular Pharyngeal residue - valleculae Pharyngeal -- Pharyngeal- Multi-consistency -- Pharyngeal -- Pharyngeal- Pill -- Pharyngeal -- Pharyngeal Comment --  No flowsheet data found. [facility], [place], [place] [facility] Office Number: [phone] Acie Fredrickson 08/04/2021, 3:31 PM                  Subjective: Patient is feeling well, no agitation, but continue to be very weak and deconditioned.   Discharge Exam: Vitals:   08/06/21 1923 08/07/21 0411  BP: 129/83 (!) 144/90  Pulse: (!) 50 (!) 101  Resp: 16 18  Temp: 98.1 F (36.7 C) 98.2 F (36.8 C)  SpO2: 97% 95%   Vitals:   08/06/21 0830 08/06/21 1144 08/06/21 1923 08/07/21 0411  BP: (!) 162/101 (!) 141/90 129/83 (!) 144/90  Pulse: 92 99 (!) 50 (!) 101  Resp: '18 18 16 18  ' Temp: 98.8 F (37.1 C)  98.1 F (36.7 C) 98.2 F (36.8 C)  TempSrc: Oral   Oral  SpO2: 96% 93% 97% 95%  Weight:    73.3 kg  Height:        General: Not in pain or dyspnea  Neurology: Awake and alert, non focal. Cognitive impairment at baseline.  E ENT: no pallor, no icterus, oral mucosa moist Cardiovascular: No JVD. S1-S2 present, rhythmic, no gallops, rubs, or murmurs. No lower extremity edema. Pulmonary: positive breath sounds bilaterally, adequate air movement, no wheezing, rhonchi or rales. Gastrointestinal. Abdomen soft and non tender Skin. No rashes Musculoskeletal: no joint deformities   The results of significant diagnostics  from this hospitalization (including imaging, microbiology, ancillary and laboratory) are listed below for reference.     Microbiology: Recent Results (from the past 240 hour(s))  Resp Panel by RT-PCR (Flu A&B, Covid) Urine, Catheterized      Status: None   Collection Time: 08/01/21  3:37 PM   Specimen: Urine, Catheterized; Nasopharyngeal(NP) swabs in vial transport medium  Result Value Ref Range Status   SARS Coronavirus 2 by RT PCR NEGATIVE NEGATIVE Final    Comment: (NOTE) SARS-CoV-2 target nucleic acids are NOT DETECTED.  The SARS-CoV-2 RNA is generally detectable in upper respiratory specimens during the acute phase of infection. The lowest concentration of SARS-CoV-2 viral copies this assay can detect is 138 copies/mL. A negative result does not preclude SARS-Cov-2 infection and should not be used as the sole basis for treatment or other patient management decisions. A negative result may occur with  improper specimen collection/handling, submission of specimen other than nasopharyngeal swab, presence of viral mutation(s) within the areas targeted by this assay, and inadequate number of viral copies(<138 copies/mL). A negative result must be combined with clinical observations, patient history, and epidemiological information. The expected result is Negative.  Fact Sheet for Patients:  EntrepreneurPulse.com.au  Fact Sheet for Healthcare Providers:  IncredibleEmployment.be  This test is no t yet approved or cleared by the [place] FDA and  has been authorized for detection and/or diagnosis of SARS-CoV-2 by FDA under an Emergency Use Authorization (EUA). This EUA will remain  in effect (meaning this test can be used) for the duration of the COVID-19 declaration under Section 564(b)(1) of the Act, 21 U.S.C.section 360bbb-3(b)(1), unless the authorization is terminated  or revoked sooner.       Influenza A by PCR NEGATIVE NEGATIVE Final   Influenza B by PCR NEGATIVE NEGATIVE Final    Comment: (NOTE) The Xpert Xpress SARS-CoV-2/FLU/RSV plus assay is intended as an aid in the diagnosis of influenza from Nasopharyngeal swab specimens and should not be used as a sole basis  for treatment. Nasal washings and aspirates are unacceptable for Xpert Xpress SARS-CoV-2/FLU/RSV testing.  Fact Sheet for Patients: EntrepreneurPulse.com.au  Fact Sheet for Healthcare Providers: IncredibleEmployment.be  This test is not yet approved or cleared by the [place] FDA and has been authorized for detection and/or diagnosis of SARS-CoV-2 by FDA under an Emergency Use Authorization (EUA). This EUA will remain in effect (meaning this test can be used) for the duration of the COVID-19 declaration under Section 564(b)(1) of the Act, 21 U.S.C. section 360bbb-3(b)(1), unless the authorization is terminated or revoked.  Performed at [facility], [place] [address], [place], [place] [address]      Labs: BNP (last 3 results) No results for input(s): BNP in the last 8760 hours. Basic Metabolic Panel: Recent Labs  Lab 08/02/21 0350 08/02/21 1300 08/03/21 0603 08/04/21 0311 08/04/21 1640 08/05/21 0245 08/05/21 1622 08/06/21 0410 08/06/21 1137 08/07/21 0358  NA 159*   < > 154* 148* 147* 145 143 145  --  144  K 3.2*   < > 2.9* 2.8* 2.9* 3.2* 3.2* 2.7* 2.9* 3.1*  CL 118*   < > 116* 112* 110 108 106 106  --  111  CO2 28   < > '29 30 29 27 26 26  ' --  26  GLUCOSE 135*   < > 132* 143* 139* 150* 125* 119*  --  150*  BUN 18   < > '14 15 14 12 13 11  ' --  10  CREATININE 1.61*   < > 1.34* 1.60* 1.43* 1.26* 1.19 1.13  --  1.11  CALCIUM 7.9*   < > 8.1* 7.8* 8.0* 8.2* 8.2* 8.3*  --  8.2*  MG 2.2  --  2.3 2.3  --  2.1  --  2.2  --   --   PHOS  --   --  2.8 3.2  --  2.6  --  2.7  --  2.5   < > = values in this interval not displayed.   Liver Function Tests: Recent Labs  Lab 08/01/21 1500  AST 30  ALT 15  ALKPHOS 82  BILITOT 1.1  PROT 7.2  ALBUMIN 3.4*   Recent Labs  Lab 08/01/21 1500  LIPASE 49   Recent Labs  Lab 08/02/21 0350  AMMONIA 26   CBC: Recent Labs  Lab 08/01/21 1500 08/02/21 0350 08/03/21 0603 08/04/21 0311  08/05/21 0245 08/06/21 0410  WBC 6.1 6.4 5.1 5.5 6.9 8.2  NEUTROABS 4.6  --   --   --   --   --   HGB 14.1 13.6 12.4* 12.1* 12.3* 12.6*  HCT 47.3 44.1 40.7 38.7* 39.2 39.3  MCV 95.9 92.3 92.7 92.1 90.1 89.9  PLT 103* 98* 96* 95* 107* 131*   Cardiac Enzymes: No results for input(s): CKTOTAL, CKMB, CKMBINDEX, TROPONINI in the last 168 hours. BNP: Invalid input(s): POCBNP CBG: No results for input(s): GLUCAP in the last 168 hours. D-Dimer No results for input(s): DDIMER in the last 72 hours. Hgb A1c No results for input(s): HGBA1C in the last 72 hours. Lipid Profile No results for input(s): CHOL, HDL, LDLCALC, TRIG, CHOLHDL, LDLDIRECT in the last 72 hours. Thyroid function studies No results for input(s): TSH, T4TOTAL, T3FREE, THYROIDAB in the last 72 hours.  Invalid input(s): FREET3 Anemia work up No results for input(s): VITAMINB12, FOLATE, FERRITIN, TIBC, IRON, RETICCTPCT in the last 72 hours. Urinalysis    Component Value Date/Time   COLORURINE AMBER (A) 08/01/2021 1640   APPEARANCEUR CLEAR 08/01/2021 1640   LABSPEC 1.016 08/01/2021 1640   PHURINE 6.0 08/01/2021 1640   GLUCOSEU NEGATIVE 08/01/2021 1640   HGBUR NEGATIVE 08/01/2021 1640   BILIRUBINUR NEGATIVE 08/01/2021 1640   KETONESUR NEGATIVE 08/01/2021 1640   PROTEINUR 30 (A) 08/01/2021 1640   UROBILINOGEN 0.2 05/29/2012 1354   NITRITE NEGATIVE 08/01/2021 1640   LEUKOCYTESUR NEGATIVE 08/01/2021 1640   Sepsis Labs Invalid input(s): PROCALCITONIN,  WBC,  LACTICIDVEN Microbiology Recent Results (from the past 240 hour(s))  Resp Panel by RT-PCR (Flu A&B, Covid) Urine, Catheterized     Status: None   Collection Time: 08/01/21  3:37 PM   Specimen: Urine, Catheterized; Nasopharyngeal(NP) swabs in vial transport medium  Result Value Ref Range Status   SARS Coronavirus 2 by RT PCR NEGATIVE NEGATIVE Final    Comment: (NOTE) SARS-CoV-2 target nucleic acids are NOT DETECTED.  The SARS-CoV-2 RNA is generally detectable  in upper respiratory specimens during the acute phase of infection. The lowest concentration of SARS-CoV-2 viral copies this assay can detect is 138 copies/mL. A negative result does not preclude SARS-Cov-2 infection and should not be used as the sole basis for treatment or other patient management decisions. A negative result may occur with  improper specimen collection/handling, submission of specimen other than nasopharyngeal swab, presence of viral mutation(s) within the areas targeted by this assay, and inadequate number of viral copies(<138 copies/mL). A negative result must be combined with clinical observations, patient history, and epidemiological information. The expected result is  Negative.  Fact Sheet for Patients:  EntrepreneurPulse.com.au  Fact Sheet for Healthcare Providers:  IncredibleEmployment.be  This test is no t yet approved or cleared by the [place] FDA and  has been authorized for detection and/or diagnosis of SARS-CoV-2 by FDA under an Emergency Use Authorization (EUA). This EUA will remain  in effect (meaning this test can be used) for the duration of the COVID-19 declaration under Section 564(b)(1) of the Act, 21 U.S.C.section 360bbb-3(b)(1), unless the authorization is terminated  or revoked sooner.       Influenza A by PCR NEGATIVE NEGATIVE Final   Influenza B by PCR NEGATIVE NEGATIVE Final    Comment: (NOTE) The Xpert Xpress SARS-CoV-2/FLU/RSV plus assay is intended as an aid in the diagnosis of influenza from Nasopharyngeal swab specimens and should not be used as a sole basis for treatment. Nasal washings and aspirates are unacceptable for Xpert Xpress SARS-CoV-2/FLU/RSV testing.  Fact Sheet for Patients: EntrepreneurPulse.com.au  Fact Sheet for Healthcare Providers: IncredibleEmployment.be  This test is not yet approved or cleared by the [place] FDA and has  been authorized for detection and/or diagnosis of SARS-CoV-2 by FDA under an Emergency Use Authorization (EUA). This EUA will remain in effect (meaning this test can be used) for the duration of the COVID-19 declaration under Section 564(b)(1) of the Act, 21 U.S.C. section 360bbb-3(b)(1), unless the authorization is terminated or revoked.  Performed at [facility], [place] [address], [place], [place] [address]      Time coordinating discharge: 45 minutes  SIGNED:   Tawni Millers, MD  [facility] 08/07/2021, 8:20 AM

## 2021-08-07 NOTE — Progress Notes (Signed)
Please cancel appt (not no show) as patient hospitalized.

## 2021-08-07 NOTE — Progress Notes (Signed)
Taken care of.   Thanks,  Danae Chen

## 2021-08-07 NOTE — Progress Notes (Signed)
Occupational Therapy Treatment Patient Details Name: Casey Fisher MRN: [id number] DOB: 1928-04-11 Today's Date: 08/07/2021    History of present illness Pt is a 87 y.o. male who presented 08/01/21 with decreased oral intake and difficulty swallowing. Workup for hypernatremia, hypokalemia, and AKI. No evidence of acute intracranial abnormality. PMH includes prostate cancer, parkinsonism/neuromuscular disorder, memory difficulty, peripheral neuropathy, cataract, depression, heart murmur, HTN.   OT comments  Pt making limited progress. Total assist +2 to roll to each side for pericare. Pt appears lethargic also reporting eye pain, drainage and some swelling noted around upper eye lids. D/c plan remains appropriate.     Follow Up Recommendations  SNF    Equipment Recommendations  Other (comment)    Recommendations for Other Services      Precautions / Restrictions Precautions Precautions: Fall;Other (comment) Precaution Comments: Bladder/bowel incontinence; bilateral soft mitts Restrictions Weight Bearing Restrictions: No       Mobility Bed Mobility Overal bed mobility: Needs Assistance Bed Mobility: Rolling Rolling: Total assist;+2 for physical assistance         General bed mobility comments: Pt requiring totalA for rolling R/L in bed; pt with very stiff/rigid trunk and BLEs, requiring totalA to assist trunk rotation and flex bilateral knees; pt not assisting with UE support on rails despite verbal tactile cues; pt unaware of bowel incontinence, dependent for washup and linen change    Transfers                 General transfer comment: Deferred secondary to little to no participation with bed mobility, fatigue/lethargy, minimal command following    Balance                                           ADL either performed or assessed with clinical judgement   ADL Overall ADL's : Needs assistance/impaired                              Toileting- Clothing Manipulation and Hygiene: +2 for physical assistance;+2 for safety/equipment;Total assistance         General ADL Comments: rolled to each side for pericare with total A +2     Vision       Perception     Praxis      Cognition Arousal/Alertness: Lethargic Behavior During Therapy: Flat affect Overall Cognitive Status: History of cognitive impairments - at baseline                                 General Comments: Pt keeping eyes closed near majority of session; eventually opening with cues, endorses discomfort when eyes open. Pt with inconsistent following of simple commands; minimal verbal responses to answers - able to state name, good afternoon, that he is cold        Exercises Other Exercises Other Exercises: Attempted bilateral knee flexion PROM (pt resistant to movement with hip/knee ext to return to flat)   Shoulder Instructions       General Comments      Pertinent Vitals/ Pain       Pain Assessment: Faces Faces Pain Scale: Hurts a little bit Pain Intervention(s): Monitored during session  Home Living  Prior Functioning/Environment              Frequency  Min 2X/week        Progress Toward Goals  OT Goals(current goals can now be found in the care plan section)  Progress towards OT goals: Not progressing toward goals - comment  Acute Rehab OT Goals Patient Stated Goal: pt unable to state, very drowsy OT Goal Formulation: With patient/family Time For Goal Achievement: 08/16/21 Potential to Achieve Goals: Fair ADL Goals Pt Will Perform Grooming: with supervision;bed level Pt Will Perform Upper Body Bathing: with min assist;sitting Pt Will Perform Lower Body Bathing: with max assist;sitting/lateral leans Pt Will Transfer to Toilet: with mod assist;with +2 assist;bedside commode  Plan Discharge plan remains appropriate    Co-evaluation     PT/OT/SLP Co-Evaluation/Treatment: Yes Reason for Co-Treatment: Necessary to address cognition/behavior during functional activity;For patient/therapist safety;To address functional/ADL transfers PT goals addressed during session: Mobility/safety with mobility OT goals addressed during session: ADL's and self-care;Strengthening/ROM      AM-PAC OT "6 Clicks" Daily Activity     Outcome Measure   Help from another person eating meals?: A Lot Help from another person taking care of personal grooming?: Total Help from another person toileting, which includes using toliet, bedpan, or urinal?: Total Help from another person bathing (including washing, rinsing, drying)?: A Lot Help from another person to put on and taking off regular upper body clothing?: Total Help from another person to put on and taking off regular lower body clothing?: Total 6 Click Score: 8    End of Session    OT Visit Diagnosis: Unsteadiness on feet (R26.81);Other abnormalities of gait and mobility (R26.89);Muscle weakness (generalized) (M62.81)   Activity Tolerance Patient limited by lethargy   Patient Left in bed;with call bell/phone within reach;with nursing/sitter in room   Nurse Communication          Time: [id number] OT Time Calculation (min): 24 min  Charges: OT General Charges $OT Visit: 1 Visit OT Treatments $Self Care/Home Management : 8-22 mins  [name], OT Acute Rehabilitation Services Pager: [phone] Office: [phone]    [name] 08/07/2021, 2:24 PM

## 2021-08-07 NOTE — Progress Notes (Signed)
Physical Therapy Treatment Patient Details Name: Casey Fisher MRN: [id number] DOB: 1928-03-03 Today's Date: 08/07/2021    History of Present Illness Pt is a 87 y.o. male who presented 08/01/21 with decreased oral intake and difficulty swallowing. Workup for hypernatremia, hypokalemia, and AKI. No evidence of acute intracranial abnormality. PMH includes prostate cancer, parkinsonism/neuromuscular disorder, memory difficulty, peripheral neuropathy, cataract, depression, heart murmur, HTN.   PT Comments    Pt making limited progression with mobility. Today's session focused on bed mobility, pt requiring totalA for this and dependent for pericare due to bowel incontinence; pt keeping eyes closed majority of session with minimal command following and minimal verbalizations. Pt limited by muscle weakness, BUE/BLE stiffness, and cognitive impairment. Will require maximove lift for OOB transfers. Will continue to follow acutely to address established goals.    Follow Up Recommendations  SNF;Supervision/Assistance - 24 hour     Equipment Recommendations  TBD - if to return home, will likely require wheelchair, hospital bed, hoyer lift   Recommendations for Other Services       Precautions / Restrictions Precautions Precautions: Fall;Other (comment) Precaution Comments: Bladder/bowel incontinence; bilateral soft mitts Restrictions Weight Bearing Restrictions: No    Mobility  Bed Mobility Overal bed mobility: Needs Assistance Bed Mobility: Rolling Rolling: Total assist;+2 for physical assistance         General bed mobility comments: Pt requiring totalA for rolling R/L in bed; pt with very stiff/rigid trunk and BLEs, requiring totalA to assist trunk rotation and flex bilateral knees; pt not assisting with UE support on rails despite verbal tactile cues; pt unaware of bowel incontinence, dependent for washup and linen change    Transfers                 General transfer  comment: Deferred secondary to little to no participation with bed mobility, fatigue/lethargy, minimal command following  Ambulation/Gait                 Stairs             Wheelchair Mobility    Modified Rankin (Stroke Patients Only)       Balance                                            Cognition Arousal/Alertness: Lethargic Behavior During Therapy: Flat affect Overall Cognitive Status: History of cognitive impairments - at baseline                                 General Comments: Pt keeping eyes closed near majority of session; eventually opening with cues, endorses discomfort when eyes open. Pt with inconsistent following of simple commands; minimal verbal responses to answers - able to state name, good afternoon, that he is cold      Exercises Other Exercises Other Exercises: Attempted bilateral knee flexion PROM (pt resistant to movement with hip/knee ext to return to flat)    General Comments        Pertinent Vitals/Pain Pain Assessment: Faces Faces Pain Scale: Hurts a little bit Pain Intervention(s): Monitored during session    Home Living                      Prior Function            PT Goals (current  goals can now be found in the care plan section) Progress towards PT goals: Not progressing toward goals - comment (fatigue/lethargy, incontinence, cognition)    Frequency    Min 2X/week      PT Plan Current plan remains appropriate    Co-evaluation PT/OT/SLP Co-Evaluation/Treatment: Yes Reason for Co-Treatment: Necessary to address cognition/behavior during functional activity;For patient/therapist safety;To address functional/ADL transfers PT goals addressed during session: Mobility/safety with mobility        AM-PAC PT "6 Clicks" Mobility   Outcome Measure  Help needed turning from your back to your side while in a flat bed without using bedrails?: Total Help needed moving from  lying on your back to sitting on the side of a flat bed without using bedrails?: Total Help needed moving to and from a bed to a chair (including a wheelchair)?: Total Help needed standing up from a chair using your arms (e.g., wheelchair or bedside chair)?: Total Help needed to walk in hospital room?: Total Help needed climbing 3-5 steps with a railing? : Total 6 Click Score: 6    End of Session   Activity Tolerance: Patient limited by fatigue;Patient limited by lethargy;Other (comment) (limited by ability to participate, bouts of bowel incontinence) Patient left: in bed;with call bell/phone within reach;with bed alarm set;with nursing/sitter in room;Other (comment) (heels floated on pillow) Nurse Communication: Mobility status;Need for lift equipment PT Visit Diagnosis: Unsteadiness on feet (R26.81);Muscle weakness (generalized) (M62.81);Difficulty in walking, not elsewhere classified (R26.2);History of falling (Z91.81);Other symptoms and signs involving the nervous system (R29.898)     Time: [id number] PT Time Calculation (min) (ACUTE ONLY): 27 min  Charges:  $Therapeutic Activity: 8-22 mins                     Mabeline Caras, PT, DPT Acute Rehabilitation Services  Pager [phone] Office [place] 08/07/2021, 1:33 PM

## 2021-08-07 NOTE — TOC Transition Note (Signed)
Transition of Care [facility]) - CM/SW Discharge Note   Patient Details  Name: Casey Fisher MRN: [id number] Date of Birth: 1928/03/28  [facility] of [facility]) CM/SW Contact:  Tresa Endo Phone Number: 08/07/2021, 1:00 PM   Clinical Narrative:    Patient will DC to: [place] date: 08/07/2021 Family notified: Pt daughter Transport by: Corey Harold   Per MD patient ready for DC to [facility]. RN to call report prior to discharge [phone]). RN, patient, patient's family, and facility notified of DC. Discharge Summary and FL2 sent to facility. DC packet on chart. Ambulance transport requested for patient.   CSW will sign off for now as social work intervention is no longer needed. Please consult [place] again if new needs arise.       Barriers to Discharge: Continued Medical Work up, SNF Pending bed offer   Patient Goals and CMS Choice   CMS Medicare.gov Compare Post Acute Care list provided to:: Patient Represenative (must comment) Choice offered to / list presented to : Adult Children  Discharge Placement                       Discharge Plan and Services                                     Social Determinants of Health (SDOH) Interventions     Readmission Risk Interventions No flowsheet data found.

## 2021-08-07 NOTE — Discharge Instructions (Signed)
**  CANNOT CRUSH [facility]**

## 2021-08-07 NOTE — Progress Notes (Signed)
Patient was discharged to [facility]. Report was called to Nash Dimmer, LPN at the facility. IV's removed. Telemetry discontinued and tele-box was returned. [facility] provided transportation.  Attempted to contact the patient's daughter, no answer.

## 2021-08-07 NOTE — TOC Progression Note (Addendum)
Transition of Care [facility]) - Progression Note    Patient Details  Name: Casey Fisher MRN: [id number] Date of Birth: [birth year]  Transition of Care [facility]) CM/SW Contact  Reece Agar, [place] Phone Number: 08/07/2021, 10:26 AM  Clinical Narrative:    CSW attempted to contact pt daughter to get permission for pt to get a covid booster before DC to [facility] as requested by [place] at [facility].  Pt COVID is back negative, CSW contacted pt daughter who explained that pt just had a covid booster on 7/31 and will not need another, CSW informed Juliann Pulse at [facility].   Expected Discharge Plan: [place] Barriers to Discharge: Continued Medical Work up, SNF Pending bed offer  Expected Discharge Plan and Services Expected Discharge Plan: [place] arrangements for the past 2 months: Single Family Home Expected Discharge Date: 08/07/21                                     Social Determinants of Health (SDOH) Interventions    Readmission Risk Interventions No flowsheet data found.

## 2021-08-08 DIAGNOSIS — R627 Adult failure to thrive: Secondary | ICD-10-CM | POA: Diagnosis not present

## 2021-08-08 DIAGNOSIS — E870 Hyperosmolality and hypernatremia: Secondary | ICD-10-CM | POA: Diagnosis not present

## 2021-08-08 DIAGNOSIS — R1312 Dysphagia, oropharyngeal phase: Secondary | ICD-10-CM | POA: Diagnosis not present

## 2021-08-08 DIAGNOSIS — R413 Other amnesia: Secondary | ICD-10-CM | POA: Diagnosis not present

## 2021-08-08 DIAGNOSIS — R630 Anorexia: Secondary | ICD-10-CM | POA: Diagnosis not present

## 2021-08-08 DIAGNOSIS — E43 Unspecified severe protein-calorie malnutrition: Secondary | ICD-10-CM | POA: Diagnosis not present

## 2021-08-08 DIAGNOSIS — E860 Dehydration: Secondary | ICD-10-CM | POA: Diagnosis not present

## 2021-08-11 DIAGNOSIS — R5383 Other fatigue: Secondary | ICD-10-CM | POA: Diagnosis not present

## 2021-08-11 DIAGNOSIS — E43 Unspecified severe protein-calorie malnutrition: Secondary | ICD-10-CM | POA: Diagnosis not present

## 2021-08-11 DIAGNOSIS — E876 Hypokalemia: Secondary | ICD-10-CM | POA: Diagnosis not present

## 2021-08-11 DIAGNOSIS — E860 Dehydration: Secondary | ICD-10-CM | POA: Diagnosis not present

## 2021-08-11 DIAGNOSIS — R627 Adult failure to thrive: Secondary | ICD-10-CM | POA: Diagnosis not present

## 2021-08-11 DIAGNOSIS — E870 Hyperosmolality and hypernatremia: Secondary | ICD-10-CM | POA: Diagnosis not present

## 2021-08-13 ENCOUNTER — Other Ambulatory Visit: Payer: Self-pay | Admitting: *Deleted

## 2021-08-13 DIAGNOSIS — R630 Anorexia: Secondary | ICD-10-CM | POA: Diagnosis not present

## 2021-08-13 DIAGNOSIS — E43 Unspecified severe protein-calorie malnutrition: Secondary | ICD-10-CM | POA: Diagnosis not present

## 2021-08-13 DIAGNOSIS — E876 Hypokalemia: Secondary | ICD-10-CM | POA: Diagnosis not present

## 2021-08-13 DIAGNOSIS — R5383 Other fatigue: Secondary | ICD-10-CM | POA: Diagnosis not present

## 2021-08-13 DIAGNOSIS — R627 Adult failure to thrive: Secondary | ICD-10-CM | POA: Diagnosis not present

## 2021-08-13 DIAGNOSIS — E860 Dehydration: Secondary | ICD-10-CM | POA: Diagnosis not present

## 2021-08-13 DIAGNOSIS — E870 Hyperosmolality and hypernatremia: Secondary | ICD-10-CM | POA: Diagnosis not present

## 2021-08-13 NOTE — Patient Outreach (Signed)
Casey Fisher recently admitted to [facility] SNF. [place] SNF SW to make aware writer is following for potential [facility] needs.   Member lived with grandson prior to admission.   PCP office has [facility] embedded care coordination team. Will continue to follow while in SNF.     Marthenia Rolling, MSN, RN,BSN [place] Acute Care Coordinator [phone] [facility]) [phone]  (Toll free office)

## 2021-08-18 ENCOUNTER — Encounter (HOSPITAL_COMMUNITY): Payer: Self-pay

## 2021-08-18 ENCOUNTER — Emergency Department (HOSPITAL_COMMUNITY): Payer: Medicare Other

## 2021-08-18 ENCOUNTER — Inpatient Hospital Stay (HOSPITAL_COMMUNITY): Payer: Medicare Other

## 2021-08-18 ENCOUNTER — Inpatient Hospital Stay (HOSPITAL_COMMUNITY)
Admission: EM | Admit: 2021-08-18 | Discharge: 2021-08-22 | DRG: 640 | Disposition: A | Discharge status: Hospice | Payer: Medicare Other | Attending: Internal Medicine | Admitting: Internal Medicine

## 2021-08-18 ENCOUNTER — Other Ambulatory Visit: Payer: Self-pay

## 2021-08-18 DIAGNOSIS — G3183 Dementia with Lewy bodies: Secondary | ICD-10-CM | POA: Diagnosis present

## 2021-08-18 DIAGNOSIS — I82451 Acute embolism and thrombosis of right peroneal vein: Secondary | ICD-10-CM | POA: Diagnosis present

## 2021-08-18 DIAGNOSIS — I110 Hypertensive heart disease with heart failure: Secondary | ICD-10-CM | POA: Diagnosis present

## 2021-08-18 DIAGNOSIS — E43 Unspecified severe protein-calorie malnutrition: Secondary | ICD-10-CM | POA: Diagnosis present

## 2021-08-18 DIAGNOSIS — N179 Acute kidney failure, unspecified: Secondary | ICD-10-CM

## 2021-08-18 DIAGNOSIS — G20 Parkinson's disease: Secondary | ICD-10-CM | POA: Diagnosis not present

## 2021-08-18 DIAGNOSIS — Z66 Do not resuscitate: Secondary | ICD-10-CM | POA: Diagnosis present

## 2021-08-18 DIAGNOSIS — Z681 Body mass index (BMI) 19 or less, adult: Secondary | ICD-10-CM | POA: Diagnosis not present

## 2021-08-18 DIAGNOSIS — I509 Heart failure, unspecified: Secondary | ICD-10-CM | POA: Diagnosis not present

## 2021-08-18 DIAGNOSIS — K567 Ileus, unspecified: Secondary | ICD-10-CM | POA: Diagnosis present

## 2021-08-18 DIAGNOSIS — F0280 Dementia in other diseases classified elsewhere without behavioral disturbance: Secondary | ICD-10-CM | POA: Diagnosis present

## 2021-08-18 DIAGNOSIS — N400 Enlarged prostate without lower urinary tract symptoms: Secondary | ICD-10-CM | POA: Diagnosis not present

## 2021-08-18 DIAGNOSIS — N390 Urinary tract infection, site not specified: Secondary | ICD-10-CM | POA: Diagnosis present

## 2021-08-18 DIAGNOSIS — K5900 Constipation, unspecified: Secondary | ICD-10-CM | POA: Diagnosis present

## 2021-08-18 DIAGNOSIS — G609 Hereditary and idiopathic neuropathy, unspecified: Secondary | ICD-10-CM | POA: Diagnosis present

## 2021-08-18 DIAGNOSIS — R609 Edema, unspecified: Secondary | ICD-10-CM | POA: Diagnosis not present

## 2021-08-18 DIAGNOSIS — I5032 Chronic diastolic (congestive) heart failure: Secondary | ICD-10-CM | POA: Diagnosis present

## 2021-08-18 DIAGNOSIS — Z8546 Personal history of malignant neoplasm of prostate: Secondary | ICD-10-CM | POA: Diagnosis not present

## 2021-08-18 DIAGNOSIS — Z888 Allergy status to other drugs, medicaments and biological substances status: Secondary | ICD-10-CM

## 2021-08-18 DIAGNOSIS — F32A Depression, unspecified: Secondary | ICD-10-CM | POA: Diagnosis present

## 2021-08-18 DIAGNOSIS — I82521 Chronic embolism and thrombosis of right iliac vein: Secondary | ICD-10-CM | POA: Diagnosis present

## 2021-08-18 DIAGNOSIS — I82411 Acute embolism and thrombosis of right femoral vein: Secondary | ICD-10-CM | POA: Diagnosis present

## 2021-08-18 DIAGNOSIS — E860 Dehydration: Secondary | ICD-10-CM | POA: Diagnosis not present

## 2021-08-18 DIAGNOSIS — D649 Anemia, unspecified: Secondary | ICD-10-CM | POA: Diagnosis present

## 2021-08-18 DIAGNOSIS — R7889 Finding of other specified substances, not normally found in blood: Secondary | ICD-10-CM | POA: Diagnosis not present

## 2021-08-18 DIAGNOSIS — N3289 Other specified disorders of bladder: Secondary | ICD-10-CM | POA: Diagnosis not present

## 2021-08-18 DIAGNOSIS — G9341 Metabolic encephalopathy: Secondary | ICD-10-CM | POA: Diagnosis present

## 2021-08-18 DIAGNOSIS — I82431 Acute embolism and thrombosis of right popliteal vein: Secondary | ICD-10-CM | POA: Diagnosis present

## 2021-08-18 DIAGNOSIS — E870 Hyperosmolality and hypernatremia: Secondary | ICD-10-CM | POA: Diagnosis not present

## 2021-08-18 DIAGNOSIS — R0600 Dyspnea, unspecified: Secondary | ICD-10-CM | POA: Diagnosis not present

## 2021-08-18 DIAGNOSIS — E785 Hyperlipidemia, unspecified: Secondary | ICD-10-CM | POA: Diagnosis not present

## 2021-08-18 DIAGNOSIS — Z20822 Contact with and (suspected) exposure to covid-19: Secondary | ICD-10-CM | POA: Diagnosis present

## 2021-08-18 DIAGNOSIS — R630 Anorexia: Secondary | ICD-10-CM | POA: Diagnosis not present

## 2021-08-18 DIAGNOSIS — R4182 Altered mental status, unspecified: Secondary | ICD-10-CM | POA: Diagnosis not present

## 2021-08-18 DIAGNOSIS — I10 Essential (primary) hypertension: Secondary | ICD-10-CM | POA: Diagnosis not present

## 2021-08-18 DIAGNOSIS — I82441 Acute embolism and thrombosis of right tibial vein: Secondary | ICD-10-CM | POA: Diagnosis present

## 2021-08-18 DIAGNOSIS — G629 Polyneuropathy, unspecified: Secondary | ICD-10-CM | POA: Diagnosis not present

## 2021-08-18 DIAGNOSIS — Z515 Encounter for palliative care: Secondary | ICD-10-CM

## 2021-08-18 DIAGNOSIS — R140 Abdominal distension (gaseous): Secondary | ICD-10-CM

## 2021-08-18 DIAGNOSIS — J9811 Atelectasis: Secondary | ICD-10-CM | POA: Diagnosis not present

## 2021-08-18 DIAGNOSIS — K6389 Other specified diseases of intestine: Secondary | ICD-10-CM | POA: Diagnosis not present

## 2021-08-18 DIAGNOSIS — E876 Hypokalemia: Secondary | ICD-10-CM | POA: Diagnosis present

## 2021-08-18 DIAGNOSIS — K8020 Calculus of gallbladder without cholecystitis without obstruction: Secondary | ICD-10-CM | POA: Diagnosis not present

## 2021-08-18 DIAGNOSIS — C61 Malignant neoplasm of prostate: Secondary | ICD-10-CM

## 2021-08-18 DIAGNOSIS — Z8249 Family history of ischemic heart disease and other diseases of the circulatory system: Secondary | ICD-10-CM

## 2021-08-18 DIAGNOSIS — Z87891 Personal history of nicotine dependence: Secondary | ICD-10-CM

## 2021-08-18 DIAGNOSIS — Z7189 Other specified counseling: Secondary | ICD-10-CM | POA: Diagnosis not present

## 2021-08-18 DIAGNOSIS — R410 Disorientation, unspecified: Secondary | ICD-10-CM | POA: Diagnosis not present

## 2021-08-18 DIAGNOSIS — I959 Hypotension, unspecified: Secondary | ICD-10-CM | POA: Diagnosis not present

## 2021-08-18 DIAGNOSIS — I82511 Chronic embolism and thrombosis of right femoral vein: Secondary | ICD-10-CM | POA: Diagnosis present

## 2021-08-18 DIAGNOSIS — R627 Adult failure to thrive: Secondary | ICD-10-CM | POA: Diagnosis present

## 2021-08-18 DIAGNOSIS — Z743 Need for continuous supervision: Secondary | ICD-10-CM | POA: Diagnosis not present

## 2021-08-18 DIAGNOSIS — Z79899 Other long term (current) drug therapy: Secondary | ICD-10-CM

## 2021-08-18 LAB — URINALYSIS, ROUTINE W REFLEX MICROSCOPIC
Bilirubin Urine: NEGATIVE
Glucose, UA: NEGATIVE mg/dL
Ketones, ur: 5 mg/dL — AB
Nitrite: NEGATIVE
Protein, ur: 100 mg/dL — AB
Specific Gravity, Urine: 1.017 (ref 1.005–1.030)
WBC, UA: >50 WBC/hpf — ABNORMAL HIGH (ref 0–5)
pH: 6 (ref 5.0–8.0)

## 2021-08-18 LAB — CBC WITH DIFFERENTIAL/PLATELET
Abs Immature Granulocytes: 0.06 10*3/uL (ref 0.00–0.07)
Basophils Absolute: 0 10*3/uL (ref 0.0–0.1)
Basophils Relative: 0 %
Eosinophils Absolute: 0 10*3/uL (ref 0.0–0.5)
Eosinophils Relative: 0 %
HCT: 45.2 % (ref 39.0–52.0)
Hemoglobin: 13.8 g/dL (ref 13.0–17.0)
Immature Granulocytes: 1 %
Lymphocytes Relative: 8 %
Lymphs Abs: 0.9 10*3/uL (ref 0.7–4.0)
MCH: 28.3 pg (ref 26.0–34.0)
MCHC: 30.5 g/dL (ref 30.0–36.0)
MCV: 92.8 fL (ref 80.0–100.0)
Monocytes Absolute: 0.5 10*3/uL (ref 0.1–1.0)
Monocytes Relative: 4 %
Neutro Abs: 9.7 10*3/uL — ABNORMAL HIGH (ref 1.7–7.7)
Neutrophils Relative %: 87 %
Platelets: 141 10*3/uL — ABNORMAL LOW (ref 150–400)
RBC: 4.87 MIL/uL (ref 4.22–5.81)
RDW: 16.5 % — ABNORMAL HIGH (ref 11.5–15.5)
WBC: 11.2 10*3/uL — ABNORMAL HIGH (ref 4.0–10.5)
nRBC: 0.3 % — ABNORMAL HIGH (ref 0.0–0.2)

## 2021-08-18 LAB — RESP PANEL BY RT-PCR (FLU A&B, COVID) ARPGX2
Influenza A by PCR: NEGATIVE
Influenza B by PCR: NEGATIVE
SARS Coronavirus 2 by RT PCR: NEGATIVE

## 2021-08-18 LAB — COMPREHENSIVE METABOLIC PANEL
ALT: 13 U/L (ref 0–44)
AST: 16 U/L (ref 15–41)
Albumin: 3.2 g/dL — ABNORMAL LOW (ref 3.5–5.0)
Alkaline Phosphatase: 78 U/L (ref 38–126)
Anion gap: 13 (ref 5–15)
BUN: 53 mg/dL — ABNORMAL HIGH (ref 8–23)
CO2: 26 mmol/L (ref 22–32)
Calcium: 9.3 mg/dL (ref 8.9–10.3)
Chloride: 125 mmol/L — ABNORMAL HIGH (ref 98–111)
Creatinine, Ser: 2.23 mg/dL — ABNORMAL HIGH (ref 0.61–1.24)
GFR, Estimated: 27 mL/min — ABNORMAL LOW (ref >60)
Glucose, Bld: 144 mg/dL — ABNORMAL HIGH (ref 70–99)
Potassium: 2.1 mmol/L — CL (ref 3.5–5.1)
Sodium: 164 mmol/L (ref 135–145)
Total Bilirubin: 0.6 mg/dL (ref 0.3–1.2)
Total Protein: 7.9 g/dL (ref 6.5–8.1)

## 2021-08-18 LAB — PROTIME-INR
INR: 1.2 (ref 0.8–1.2)
Prothrombin Time: 15.6 seconds — ABNORMAL HIGH (ref 11.4–15.2)

## 2021-08-18 LAB — LACTIC ACID, PLASMA
Lactic Acid, Venous: 3.4 mmol/L (ref 0.5–1.9)
Lactic Acid, Venous: 2 mmol/L (ref 0.5–1.9)

## 2021-08-18 LAB — CREATININE, URINE, RANDOM: Creatinine, Urine: 89.87 mg/dL

## 2021-08-18 LAB — SODIUM, URINE, RANDOM: Sodium, Ur: 46 mmol/L

## 2021-08-18 MED ORDER — POTASSIUM CHLORIDE 10 MEQ/100ML IV SOLN
10.0000 meq | Freq: Once | INTRAVENOUS | Status: AC
  Administered 2021-08-18: 10 meq via INTRAVENOUS
  Filled 2021-08-18: qty 100

## 2021-08-18 MED ORDER — THIAMINE HCL 100 MG/ML IJ SOLN
500.0000 mg | INTRAVENOUS | Status: AC
  Administered 2021-08-18: 500 mg via INTRAVENOUS
  Filled 2021-08-18 (×2): qty 5

## 2021-08-18 MED ORDER — SODIUM CHLORIDE 0.9 % IV BOLUS
1000.0000 mL | Freq: Once | INTRAVENOUS | Status: AC
  Administered 2021-08-18: 1000 mL via INTRAVENOUS

## 2021-08-18 MED ORDER — POTASSIUM CHLORIDE 10 MEQ/100ML IV SOLN
10.0000 meq | INTRAVENOUS | Status: AC
Start: 2021-08-18 — End: 2021-08-19
  Administered 2021-08-18 – 2021-08-19 (×4): 10 meq via INTRAVENOUS
  Filled 2021-08-18 (×3): qty 100

## 2021-08-18 MED ORDER — THIAMINE HCL 100 MG/ML IJ SOLN
100.0000 mg | Freq: Every day | INTRAMUSCULAR | Status: DC
  Administered 2021-08-19 – 2021-08-21 (×3): 100 mg via INTRAVENOUS
  Filled 2021-08-18 (×3): qty 2

## 2021-08-18 NOTE — Telephone Encounter (Signed)
Encounter started in error.

## 2021-08-18 NOTE — ED Notes (Signed)
Critical value:  Sodium 164 Potassium 2.1 Lactic acid 3.4  Dr. Francia Greaves notified

## 2021-08-18 NOTE — H&P (Addendum)
Casey Fisher [id number] DOB: 01-08-1928 DOA: 08/18/2021     PCP: [name], MD   Outpatient Specialists: NONE    Patient arrived to ER on 08/18/21 at 1709 Referred by Attending Valarie Merino, MD   Patient coming from:   From facility    Chief Complaint:   Chief Complaint  Patient presents with   Abnormal Labs    HPI: Casey Fisher is a 87 y.o. male with medical history significant of Dementia, parkinson, Dysphasia, for prostate cancer,  , neuromuscular disorder,  Presented with   poor Po intake, unable to provide his own hx Admitted in August with hypernatremia was rehydrated and did better Palliative care was consulted at the time family felt that they were not ready for hospice patient was discharged to SNF patient is DO NOT RESUSCITATE per reccords       Initial COVID TEST  NEGATIVE   Lab Results  Component Value Date   [place] 08/18/2021   [place] NEGATIVE 08/07/2021   [place] NEGATIVE 08/01/2021      Regarding pertinent Chronic problems:     Hyperlipidemia -   on statins simvastatin Lipid Panel     Component Value Date/Time   CHOL 167 05/12/2021 1359   CHOL 176 11/11/2020 1010   TRIG 82.0 05/12/2021 1359   HDL 58.20 05/12/2021 1359   HDL 61 11/11/2020 1010   CHOLHDL 3 05/12/2021 1359   VLDL 16.4 05/12/2021 1359   [place] 92 05/12/2021 1359   [place] 104 (H) 11/11/2020 1010   LABVLDL 11 11/11/2020 1010     HTN on Norvasc  chronic CHF diastolic- last echo [id number]  grade 2 diastolic dysfunction Parkinson disease on Sinemet aspiration precautions  BPH - on Flomax,     Dementia - on Aricept   Chronic anemia - baseline hg Hemoglobin & Hematocrit  Recent Labs    08/05/21 0245 08/06/21 0410 08/18/21 1822  HGB 12.3* 12.6* 13.8   History of prostate cancer follow-up as an outpatient on erleda and leuproldide  While in ER: Noted to have hyponatremia up to 163 hypokalemia down to 2.1 Elevated  lactic acid up to 3.1.  Patient appears to be significantly dehydrated No evidence of infection chest x-ray unremarkable UA pending.  Started on potassium replacement in fluid resuscitation   ED Triage Vitals  Enc Vitals Group     BP 08/18/21 1724 119/63     Pulse Rate 08/18/21 1724 68     Resp 08/18/21 1724 16     Temp 08/18/21 1724 97.6 F (36.4 C)     Temp Source 08/18/21 1724 Oral     SpO2 08/18/21 1724 92 %     Weight --      Height --      Head Circumference --      Peak Flow --      Pain Score 08/18/21 1725 0     Pain Loc --      Pain Edu? --      Excl. in [place]? --   TMAX(24)@     _________________________________________ Significant initial  Findings: Abnormal Labs Reviewed  COMPREHENSIVE METABOLIC PANEL - Abnormal; Notable for the following components:      Result Value   Sodium 164 (*)    Potassium 2.1 (*)    Chloride 125 (*)    Glucose, Bld 144 (*)    BUN 53 (*)    Creatinine, Ser 2.23 (*)    Albumin 3.2 (*)  GFR, Estimated 27 (*)    All other components within normal limits  CBC WITH DIFFERENTIAL/PLATELET - Abnormal; Notable for the following components:   WBC 11.2 (*)    RDW 16.5 (*)    Platelets 141 (*)    nRBC 0.3 (*)    Neutro Abs 9.7 (*)    All other components within normal limits  PROTIME-INR - Abnormal; Notable for the following components:   Prothrombin Time 15.6 (*)    All other components within normal limits  LACTIC ACID, PLASMA - Abnormal; Notable for the following components:   Lactic Acid, Venous 3.4 (*)    All other components within normal limits   ____________________________________________ Ordered    CXR -  NON acute    _________________________ Troponin   ECG: Ordered    The recent clinical data is shown below. Vitals:   08/18/21 1724 08/18/21 1815 08/18/21 1829 08/18/21 1930  BP: 119/63 120/74 120/74 122/74  Pulse: 68  68 60  Resp: '16 19 19 20  '$ Temp: 97.6 F (36.4 C)     TempSrc: Oral     SpO2: 92%  92% 94%     WBC     Component Value Date/Time   WBC 11.2 (H) 08/18/2021 1822   LYMPHSABS 0.9 08/18/2021 1822   LYMPHSABS WILL FOLLOW 03/18/2017 1822   MONOABS 0.5 08/18/2021 1822   EOSABS 0.0 08/18/2021 1822   EOSABS WILL FOLLOW 03/18/2017 1822   BASOSABS 0.0 08/18/2021 1822   BASOSABS WILL FOLLOW 03/18/2017 1822    Lactic Acid, Venous    Component Value Date/Time   LATICACIDVEN 3.4 ([place]) 08/18/2021 1822      UA  ordered    Results for orders placed or performed during the hospital encounter of 08/18/21  Resp Panel by RT-PCR (Flu A&B, Covid) Nasopharyngeal Swab     Status: None   Collection Time: 08/18/21  6:20 PM   Specimen: Nasopharyngeal Swab; Nasopharyngeal(NP) swabs in vial transport medium  Result Value Ref Range Status   SARS Coronavirus 2 by RT PCR NEGATIVE NEGATIVE Final         Influenza A by PCR NEGATIVE NEGATIVE Final   Influenza B by PCR NEGATIVE NEGATIVE Final            _______________________________________________ Hospitalist was called for admission for AKI dehydration hyponatremia  The following Work up has been ordered so far:  Orders Placed This Encounter  Procedures   Resp Panel by RT-PCR (Flu A&B, Covid) Nasopharyngeal Swab   DG Chest Port 1 View   Urinalysis, Routine w reflex microscopic   Comprehensive metabolic panel   CBC with Differential   Protime-INR   Lactic acid, plasma   Cardiac monitoring   Consult to hospitalist  Dehydration - hypokalemia - AKI   ED EKG   Saline lock IV      Following Medications were ordered in ER: Medications  potassium chloride 10 mEq in 100 mL IVPB (has no administration in time range)  sodium chloride 0.9 % bolus 1,000 mL (has no administration in time range)  sodium chloride 0.9 % bolus 1,000 mL (0 mLs Intravenous Stopped 08/18/21 1940)        Consult Orders  (From admission, onward)           Start     Ordered   08/18/21 1949  Consult to hospitalist  Dehydration - hypokalemia - AKI  Once        Comments: Dehydration - hypokalemia - AKI  Provider:  (Not yet  assigned)  Question Answer Comment  Place call to: [facility]   Reason for Consult Admit      08/18/21 1950              OTHER Significant initial  Findings:  labs showing:    Recent Labs  Lab 08/18/21 1822  NA 164*  K 2.1*  CO2 26  GLUCOSE 144*  BUN 53*  CREATININE 2.23*  CALCIUM 9.3    Cr  * stable,  Up from baseline see below Lab Results  Component Value Date   CREATININE 2.23 (H) 08/18/2021   CREATININE 1.11 08/07/2021   CREATININE 1.13 08/06/2021    Recent Labs  Lab 08/18/21 1822  AST 16  ALT 13  ALKPHOS 78  BILITOT 0.6  PROT 7.9  ALBUMIN 3.2*   Lab Results  Component Value Date   CALCIUM 9.3 08/18/2021   PHOS 2.5 08/07/2021        Plt: Lab Results  Component Value Date   PLT 141 (L) 08/18/2021        Recent Labs  Lab 08/18/21 1822  WBC 11.2*  NEUTROABS 9.7*  HGB 13.8  HCT 45.2  MCV 92.8  PLT 141*    HG/HCT  stable,       Component Value Date/Time   HGB 13.8 08/18/2021 1822   HGB 12.7 (L) 09/16/2017 1011   HCT 45.2 08/18/2021 1822   HCT 39.0 09/16/2017 1011   MCV 92.8 08/18/2021 1822   MCV 86 09/16/2017 1011      DM  labs:  HbA1C: No results for input(s): HGBA1C in the last 8760 hours.     CBG (last 3)  No results for input(s): GLUCAP in the last 72 hours.        Cultures:    Component Value Date/Time   SDES  06/07/2021 1917    URINE, RANDOM Performed at [facility], [place] [address], [place], [place] [address]    SPECREQUEST  06/07/2021 1917    NONE Performed at [facility], [place] [address], [place], [place] [address]    CULT (A) 06/07/2021 1917    <10,000 COLONIES/mL INSIGNIFICANT GROWTH Performed at [facility] [address] [place], [place] [address]    REPTSTATUS 06/09/2021 FINAL 06/07/2021 1917     Radiological Exams on Admission: DG Chest Port 1 View  Result Date:  08/18/2021 CLINICAL DATA:  Dyspnea. EXAM: PORTABLE CHEST 1 VIEW COMPARISON:  08/01/2021 FINDINGS: Stable cardiomediastinal contours. Unchanged asymmetric elevation of the left hemidiaphragm. Atelectasis is noted in the left base. Asymmetric increase interstitial markings are noted within the left lung. Right lung appears clear. IMPRESSION: 1. Asymmetric increase interstitial markings in the left lung may represent asymmetric edema or atypical infection. 2. Stable asymmetric elevation of left hemidiaphragm. Electronically Signed   By: Kerby Moors M.D.   On: 08/18/2021 18:19   _______________________________________________________________________________________________________ Latest  Blood pressure 122/74, pulse 60, temperature 97.6 F (36.4 C), temperature source Oral, resp. rate 20, SpO2 94 %.   Review of Systems:    Pertinent positives include:   fatigue,loss of appetite,  Constitutional:  No weight loss, night sweats, Fevers, chills, weight loss  HEENT:  No headaches, Difficulty swallowing,Tooth/dental problems,Sore throat,  No sneezing, itching, ear ache, nasal congestion, post nasal drip,  Cardio-vascular:  No chest pain, Orthopnea, PND, anasarca, dizziness, palpitations.no Bilateral lower extremity swelling  GI:  No heartburn, indigestion, abdominal pain, nausea, vomiting, diarrhea, change in bowel habits, melena, blood in stool, hematemesis  Resp:  no shortness of breath at rest. No dyspnea on exertion, No excess mucus, no productive cough, No non-productive cough, No coughing up of blood.No change in color of mucus.No wheezing. Skin:  no rash or lesions. No jaundice GU:  no dysuria, change in color of urine, no urgency or frequency. No straining to urinate.  No flank pain.  Musculoskeletal:  No joint pain or no joint swelling. No decreased range of motion. No back pain.  Psych:  No change in mood or affect. No depression or anxiety. No memory loss.  Neuro: no localizing  neurological complaints, no tingling, no weakness, no double vision, no gait abnormality, no slurred speech, no confusion  All systems reviewed and apart from [place] all are negative _______________________________________________________________________________________________ Past Medical History:   Past Medical History:  Diagnosis Date   Abnormality of gait 03/24/2016   Anxiety    Cancer ([place])    Cataract    Depression    Heart murmur    Hereditary and idiopathic peripheral neuropathy 03/24/2016   Hypertension    Memory difficulty 03/24/2016   Neuromuscular disorder ([place])    Neuropathy    Parkinson's disease ([place]) 05/07/2021   Prostate cancer ([place])    remission      Past Surgical History:  Procedure Laterality Date   CATARACT EXTRACTION Bilateral    EYE SURGERY     HERNIA REPAIR  1985   INGUINAL HERNIA REPAIR Right 12/06/2013   Procedure: HERNIA REPAIR INGUINAL ADULT;  Surgeon: Merrie Roof, MD;  Location: [facility];  Service: General;  Laterality: Right;   INSERTION OF MESH Right 12/06/2013   Procedure: INSERTION OF MESH;  Surgeon: Merrie Roof, MD;  Location: [facility];  Service: General;  Laterality: Right;   PROSTATE SURGERY      Social History:       reports that he has quit smoking. He has never used smokeless tobacco. He reports that he does not drink alcohol and does not use drugs.     Family History:   Family History  Problem Relation Age of Onset   Brain cancer Daughter    Heart disease Mother    Stroke Mother    Heart disease Father    ______________________________________________________________________________________________ Allergies: Allergies  Allergen Reactions   Lyrica [Pregabalin] Other (See Comments)    "constipation"     Prior to Admission medications   Medication Sig Start Date End Date Taking? Authorizing Provider  acetaminophen (TYLENOL) 325 MG tablet Take 2 tablets (650 mg total) by mouth every 6 (six) hours as needed for mild pain  or moderate pain. 08/07/21   Arrien, Jimmy Picket, MD  amLODipine (NORVASC) 10 MG tablet Take 1 tablet (10 mg total) by mouth daily. 08/07/21 09/06/21  Arrien, Jimmy Picket, MD  ammonium lactate (AMLACTIN) 12 % lotion Apply 1 application topically 2 (two) times daily as needed for dry skin. 08/07/21   Arrien, Jimmy Picket, MD  Calcium Carb-Cholecalciferol (CALCIUM 600 + D PO) Take 1 tablet by mouth daily.    [provider]  carbidopa-levodopa (SINEMET) 25-250 MG tablet Take 1 tablet by mouth 3 (three) times daily. 05/07/21   Kathrynn Ducking, MD  donepezil (ARICEPT) 5 MG tablet Take 1 tablet (5 mg total) by mouth at bedtime. 03/12/21   Suzzanne Cloud, NP  DULoxetine (CYMBALTA) 30 MG capsule Take 1 capsule (30 mg total) by mouth 2 (two) times daily. 05/01/21   Suzzanne Cloud, NP  ERLEADA 60 MG tablet Take 2  tablets (120 mg total) by mouth daily. *DO NOT CRUSH* 08/07/21   Arrien, Jimmy Picket, MD  gabapentin (NEURONTIN) 250 MG/5ML solution Take 6 mLs (300 mg total) by mouth every 8 (eight) hours. 08/07/21 09/06/21  Arrien, Jimmy Picket, MD  Leuprolide Acetate (LUPRON IJ) Inject 1 application as directed every 6 (six) months.    [provider]  simvastatin (ZOCOR) 40 MG tablet TAKE 1 TABLET(40 MG) BY MOUTH DAILY 05/12/21   [name], MD  tamsulosin (FLOMAX) 0.4 MG CAPS capsule Take 0.4 mg daily by mouth. Reported on 05/14/2016 08/17/15   [provider]    ___________________________________________________________________________________________________ Physical Exam: Vitals with BMI 08/18/2021 08/18/2021 08/18/2021  Height - - -  Weight - - -  BMI - - -  Systolic [id number]  Diastolic 74 74 74  Pulse 60 68 -     1. General:  in No  Acute distress    Chronically ill   -appearing 2. Psychological: Alert and not  Oriented 3. Head/ENT:    Dry Mucous Membranes                          Head Non traumatic, neck supple                       Poor  Dentition 4. SKIN: decreased Skin turgor,  Skin clean Dry and intact no rash 5. Heart: Regular rate and rhythm no  Murmur, no Rub or gallop 6. Lungs:   no wheezes or crackles   7. Abdomen: Soft,  non-tender,  distended  obese  bowel sounds present 8. Lower extremities: no clubbing, cyanosis, trace edema 9. Neurologically Grossly intact, moving all 4 extremities equally   10. MSK: Normal range of motion    Chart has been reviewed  ______________________________________________________________________________________________  Assessment/Plan   87 y.o. male with medical history significant of Dementia, parkinson, Dysphasia, for prostate cancer,  , neuromuscular disorder,  Admitted for dehydration and AKI hypernatremia  Present on Admission:  Hypernatremia in the setting of dehydration poor p.o. intake.  Will rehydrate with D5 half-normal.  Follow serial labs avoid rapid Correction Given poor p.o. intake administer IV thiamine prior to giving D5  UTI may be contributing to why patient stopped eating.  We will treat and obtain results of urine culture  KUB worrisome for ileus could be also explained the reason why patient is not tolerating p.o. supportive treatment, order CT abdomen to rule out any evidence of SBO Patient's family was considering transition to comfort  If patient's state does not improve.  If NG tube needed will discuss with family first prior to proceeding   Protein-calorie malnutrition, severe check prealbumin order nutritional consult Given poor p.o. intake order speech pathology evaluation will need discussion with family regarding goals of care unable to get a hold of family tonight   Prostate cancer ([place]) chronic stable resume home medications when able   Parkinson's disease ([place]) would attempt to resume home medications if able to tolerate   Hyperlipidemia -restart home medications if able   Dehydration rehydrate and follow fluid status   AKI (acute kidney  injury) ([place]) obtain electrolytes rehydrate and follow fluid status    Abdominal distention obtain KUB to evaluate for obstipation as this can affect patient's appetite  Hypokalemia - - will replace and repeat in AM,  check magnesium level and replace as needed  Leg edema most likely secondary to low albumin but  given diminished ambulation can check Dopplers Other plan as per orders.  Appears to be intravascularly depleted  Chronic CHF diastolic appears to be currently dry  DVT prophylaxis:  hep [place]    Code Status:   DNR/DNI   as per family  Family Communication:   Family   at  Bedside Spoke with daughter  Disposition Plan:                              Back to current facility when stable                              Following barriers for discharge:                             Electrolytes corrected                               Anemia corrected                             Pain controlled with PO medications                               Afebrile, white count improving able to transition to PO antibiotics                             Will need to be able to tolerate PO                            Will likely need home health, home O2, set up                           Will need consultants to evaluate patient prior to discharge                        Would benefit from PT/OT eval prior to DC  Ordered                   Swallow eval - SLP ordered                                       Transition of care consulted                   Nutrition    consulted                                  Palliative care    consulted                                       Consults called:     Admission status:  ED Disposition     ED Disposition  Admit   Condition  --   [place]:  [place] [[id number]]  Level of Care: Progressive [102]  Admit to Progressive based on following criteria: NEPHROLOGY stable condition requiring close monitoring for AKI, requiring  Hemodialysis or Peritoneal Dialysis either from expected electrolyte imbalance, acidosis, or fluid overload that can be managed by NIPPV or high flow oxygen.  May admit patient to [facility] or [facility] if equivalent level of care is available:: No  Covid Evaluation: Confirmed COVID Negative  Diagnosis: Hypernatremia [id number]  Admitting Physician: Toy Baker [[id number]]  Attending Physician: Toy Baker [[id number]]  Estimated length of stay: past midnight tomorrow  Certification:: I certify this patient will need inpatient services for at least 2 midnights            inpatient     I Expect 2 midnight stay secondary to severity of patient's current illness need for inpatient interventions justified by the following:    Severe lab/radiological/exam abnormalities including:    Hyper natremia and extensive comorbidities including:    dementia    malignancy,    That are currently affecting medical management.   I expect  patient to be hospitalized for 2 midnights requiring inpatient medical care.  Patient is at high risk for adverse outcome (such as loss of life or disability) if not treated.  Indication for inpatient stay as follows:    inability to maintain oral hydration     Need for   IV fluids,      Level of care progressive  tele indefinitely please discontinue once patient no longer qualifies COVID-19 Labs    Lab Results  Component Value Date   [place] 08/18/2021     Precautions: admitted as  Covid Negative     PPE: Used by the provider:   N95  eye Goggles,  Gloves      Julis Haubner 08/18/2021, 9:00 PM    [facility]     after 2 AM please page floor coverage PA If 7AM-7PM, please contact the day team taking care of the patient using [email or web address]   Patient was evaluated in the context of the global COVID-19 pandemic, which necessitated consideration that the patient might be at risk for infection with the  SARS-CoV-2 virus that causes COVID-19. Institutional protocols and algorithms that pertain to the evaluation of patients at risk for COVID-19 are in a state of rapid change based on information released by regulatory bodies including the CDC and federal and state organizations. These policies and algorithms were followed during the patient's care.

## 2021-08-18 NOTE — ED Provider Notes (Signed)
[facility] DEPT Provider Note   CSN: [id number] Arrival date & time: 08/18/21  1709     History Chief Complaint  Patient presents with   Abnormal Labs    Casey Fisher is a 87 y.o. male.  87 year old male with prior medical history as detailed below presents from [facility] for reported abnormal lab findings.  Patient with recent labs that revealed significant hyponatremia and AKI.  Patient with decreased p.o. intake over the last 2 to 3 days per report.  Patient is unable to provide significant history.  Level 5 caveat secondary to same.  Patient is DNR.   The history is provided by the patient and medical records.  Illness Location:  Decreased p.o. intake, dehydration, abnormal lab findings Severity:  Moderate Onset quality:  Unable to specify Timing:  Unable to specify Progression:  Unable to specify Chronicity:  Recurrent     Past Medical History:  Diagnosis Date   Abnormality of gait 03/24/2016   Anxiety    Cancer ([place])    Cataract    Depression    Heart murmur    Hereditary and idiopathic peripheral neuropathy 03/24/2016   Hypertension    Memory difficulty 03/24/2016   Neuromuscular disorder ([place])    Neuropathy    Parkinson's disease ([place]) 05/07/2021   Prostate cancer ([place])    remission    Patient Active Problem List   Diagnosis Date Noted   Protein-calorie malnutrition, severe 08/05/2021   Hypernatremia 08/01/2021   AKI (acute kidney injury) ([place]) 08/01/2021   Decreased oral intake 08/01/2021   Dehydration 08/01/2021   Parkinsonism ([place]) 08/01/2021   Parkinson's disease ([place]) 05/07/2021   Primary osteoarthritis of right knee 05/23/2020   Low back pain 08/03/2019   Acute pain of left shoulder 10/10/2018   Chest pain 11/08/2017   Memory difficulty 03/24/2016   Hereditary and idiopathic peripheral neuropathy 03/24/2016   Abnormality of gait 03/24/2016   Peripheral neuropathy 04/18/2015   Hyperlipidemia  04/18/2015   Prostate cancer ([place]) 08/24/2014   Right inguinal hernia 10/30/2013    Past Surgical History:  Procedure Laterality Date   CATARACT EXTRACTION Bilateral    EYE SURGERY     HERNIA [place] Right 12/06/2013   Procedure: HERNIA REPAIR INGUINAL ADULT;  Surgeon: Merrie Roof, MD;  Location: [facility];  Service: General;  Laterality: Right;   INSERTION OF MESH Right 12/06/2013   Procedure: INSERTION OF MESH;  Surgeon: Merrie Roof, MD;  Location: [facility];  Service: General;  Laterality: Right;   PROSTATE SURGERY         Family History  Problem Relation Age of Onset   Brain cancer Daughter    Heart disease Mother    Stroke Mother    Heart disease Father     Social History   Tobacco Use   Smoking status: Former   Smokeless tobacco: Never  Vaping Use   Vaping Use: Never used  Substance Use Topics   Alcohol use: No   Drug use: No    Home Medications Prior to Admission medications   Medication Sig Start Date End Date Taking? Authorizing Provider  acetaminophen (TYLENOL) 325 MG tablet Take 2 tablets (650 mg total) by mouth every 6 (six) hours as needed for mild pain or moderate pain. 08/07/21   Arrien, Jimmy Picket, MD  amLODipine (NORVASC) 10 MG tablet Take 1 tablet (10 mg total) by mouth daily. 08/07/21 09/06/21  Arrien, Jimmy Picket,  MD  ammonium lactate (AMLACTIN) 12 % lotion Apply 1 application topically 2 (two) times daily as needed for dry skin. 08/07/21   Arrien, Jimmy Picket, MD  Calcium Carb-Cholecalciferol (CALCIUM 600 + D PO) Take 1 tablet by mouth daily.    [provider]  carbidopa-levodopa (SINEMET) 25-250 MG tablet Take 1 tablet by mouth 3 (three) times daily. 05/07/21   Kathrynn Ducking, MD  donepezil (ARICEPT) 5 MG tablet Take 1 tablet (5 mg total) by mouth at bedtime. 03/12/21   Suzzanne Cloud, NP  DULoxetine (CYMBALTA) 30 MG capsule Take 1 capsule (30 mg total) by mouth 2 (two) times daily. 05/01/21   Suzzanne Cloud, NP  ERLEADA 60 MG tablet Take 2 tablets (120 mg total) by mouth daily. *DO NOT CRUSH* 08/07/21   Arrien, Jimmy Picket, MD  gabapentin (NEURONTIN) 250 MG/5ML solution Take 6 mLs (300 mg total) by mouth every 8 (eight) hours. 08/07/21 09/06/21  Arrien, Jimmy Picket, MD  Leuprolide Acetate (LUPRON IJ) Inject 1 application as directed every 6 (six) months.    [provider]  simvastatin (ZOCOR) 40 MG tablet TAKE 1 TABLET(40 MG) BY MOUTH DAILY 05/12/21   [name], MD  tamsulosin (FLOMAX) 0.4 MG CAPS capsule Take 0.4 mg daily by mouth. Reported on 05/14/2016 08/17/15   [provider]    Allergies    Lyrica [pregabalin]  Review of Systems   Review of Systems  All other systems reviewed and are negative.  Physical Exam Updated Vital Signs BP 119/63   Pulse 68   Temp 97.6 F (36.4 C) (Oral)   Resp 16   SpO2 92%   Physical Exam Vitals and nursing note reviewed.  Constitutional:      General: He is not in acute distress.    Appearance: Normal appearance. He is well-developed.  HENT:     Head: Normocephalic and atraumatic.     Mouth/Throat:     Mouth: Mucous membranes are dry.  Eyes:     Conjunctiva/sclera: Conjunctivae normal.     Pupils: Pupils are equal, round, and reactive to light.  Cardiovascular:     Rate and Rhythm: Normal rate and regular rhythm.     Heart sounds: Normal heart sounds.  Pulmonary:     Effort: Pulmonary effort is normal. No respiratory distress.     Breath sounds: Normal breath sounds.  Abdominal:     General: There is no distension.     Palpations: Abdomen is soft.     Tenderness: There is no abdominal tenderness.  Musculoskeletal:        General: No deformity. Normal range of motion.     Cervical back: Normal range of motion and neck supple.  Skin:    General: Skin is warm and dry.     Comments: Skin tenting present   Neurological:     General: No focal deficit present.     Mental Status: He is alert. Mental  status is at baseline.    ED Results / Procedures / Treatments   Labs (all labs ordered are listed, but only abnormal results are displayed) Labs Reviewed  RESP PANEL BY RT-PCR (FLU A&B, COVID) [id number]  URINALYSIS, ROUTINE W REFLEX MICROSCOPIC  COMPREHENSIVE METABOLIC PANEL  CBC WITH DIFFERENTIAL/PLATELET  PROTIME-INR  LACTIC ACID, PLASMA  LACTIC ACID, PLASMA    EKG None  Radiology No results found.  Procedures Procedures   Medications Ordered in ED Medications  sodium chloride 0.9 % bolus 1,000 mL (has no  administration in time range)    ED Course  I have reviewed the triage vital signs and the nursing notes.  Pertinent labs & imaging results that were available during my care of the patient were reviewed by me and considered in my medical decision making (see chart for details).    MDM Rules/Calculators/A&P                           MDM  MSE complete  Casey Fisher was evaluated in Emergency Department on 08/18/2021 for the symptoms described in the history of present illness. He was evaluated in the context of the global COVID-19 pandemic, which necessitated consideration that the patient might be at risk for infection with the SARS-CoV-2 virus that causes COVID-19. Institutional protocols and algorithms that pertain to the evaluation of patients at risk for COVID-19 are in a state of rapid change based on information released by regulatory bodies including the CDC and federal and state organizations. These policies and algorithms were followed during the patient's care in the ED.  Patient is presenting with decreased p.o. intake over the last 48 to 72 hours.  Patient with labs that are reflective of this with elevated sodium, increased lactic acid, hypokalemia, and AKI.  Patient is DNR.  Recent palliative care consult from August 7th reflects family wishes to continue basic supportive care including IV fluids.  Hospitalist service aware of case and will  evaluate for admission.     Final Clinical Impression(s) / ED Diagnoses Final diagnoses:  Hypernatremia  Dehydration  AKI (acute kidney injury) [facility])    Rx / DC Orders ED Discharge Orders     None        Valarie Merino, MD 08/18/21 2022

## 2021-08-18 NOTE — ED Triage Notes (Signed)
EMS reports from [facility], called out for abnormal labs from today, results at bedside. Staff states Pt not eating yesterday and today, and was given fluids.  BP 105/50 HR 62 RR 16 Sp02 96 RA

## 2021-08-18 NOTE — Telephone Encounter (Signed)
Encounter opened in error

## 2021-08-19 ENCOUNTER — Inpatient Hospital Stay (HOSPITAL_COMMUNITY): Payer: Medicare Other

## 2021-08-19 DIAGNOSIS — Z7189 Other specified counseling: Secondary | ICD-10-CM | POA: Diagnosis not present

## 2021-08-19 DIAGNOSIS — K567 Ileus, unspecified: Secondary | ICD-10-CM | POA: Diagnosis present

## 2021-08-19 DIAGNOSIS — E860 Dehydration: Secondary | ICD-10-CM | POA: Diagnosis not present

## 2021-08-19 DIAGNOSIS — E43 Unspecified severe protein-calorie malnutrition: Secondary | ICD-10-CM | POA: Diagnosis not present

## 2021-08-19 DIAGNOSIS — G20 Parkinson's disease: Secondary | ICD-10-CM

## 2021-08-19 DIAGNOSIS — N390 Urinary tract infection, site not specified: Secondary | ICD-10-CM | POA: Diagnosis present

## 2021-08-19 DIAGNOSIS — R609 Edema, unspecified: Secondary | ICD-10-CM

## 2021-08-19 DIAGNOSIS — E870 Hyperosmolality and hypernatremia: Secondary | ICD-10-CM | POA: Diagnosis not present

## 2021-08-19 DIAGNOSIS — Z515 Encounter for palliative care: Secondary | ICD-10-CM

## 2021-08-19 LAB — BASIC METABOLIC PANEL
Anion gap: 7 (ref 5–15)
Anion gap: 8 (ref 5–15)
Anion gap: 8 (ref 5–15)
BUN: 46 mg/dL — ABNORMAL HIGH (ref 8–23)
BUN: 37 mg/dL — ABNORMAL HIGH (ref 8–23)
BUN: 44 mg/dL — ABNORMAL HIGH (ref 8–23)
CO2: 23 mmol/L (ref 22–32)
CO2: 23 mmol/L (ref 22–32)
CO2: 26 mmol/L (ref 22–32)
Calcium: 8.2 mg/dL — ABNORMAL LOW (ref 8.9–10.3)
Calcium: 8.5 mg/dL — ABNORMAL LOW (ref 8.9–10.3)
Calcium: 8.2 mg/dL — ABNORMAL LOW (ref 8.9–10.3)
Chloride: 130 mmol/L — ABNORMAL HIGH (ref 98–111)
Chloride: 129 mmol/L — ABNORMAL HIGH (ref 98–111)
Chloride: 129 mmol/L — ABNORMAL HIGH (ref 98–111)
Creatinine, Ser: 1.86 mg/dL — ABNORMAL HIGH (ref 0.61–1.24)
Creatinine, Ser: 1.83 mg/dL — ABNORMAL HIGH (ref 0.61–1.24)
Creatinine, Ser: 1.76 mg/dL — ABNORMAL HIGH (ref 0.61–1.24)
GFR, Estimated: 34 mL/min — ABNORMAL LOW (ref >60)
GFR, Estimated: 34 mL/min — ABNORMAL LOW (ref >60)
GFR, Estimated: 36 mL/min — ABNORMAL LOW (ref >60)
Glucose, Bld: 148 mg/dL — ABNORMAL HIGH (ref 70–99)
Glucose, Bld: 136 mg/dL — ABNORMAL HIGH (ref 70–99)
Glucose, Bld: 124 mg/dL — ABNORMAL HIGH (ref 70–99)
Potassium: 2.9 mmol/L — ABNORMAL LOW (ref 3.5–5.1)
Potassium: <2 mmol/L — CL (ref 3.5–5.1)
Potassium: 2 mmol/L — CL (ref 3.5–5.1)
Sodium: 160 mmol/L — ABNORMAL HIGH (ref 135–145)
Sodium: 160 mmol/L — ABNORMAL HIGH (ref 135–145)
Sodium: 163 mmol/L (ref 135–145)

## 2021-08-19 LAB — CBC WITH DIFFERENTIAL/PLATELET
Abs Immature Granulocytes: 0.07 10*3/uL (ref 0.00–0.07)
Basophils Absolute: 0 10*3/uL (ref 0.0–0.1)
Basophils Relative: 0 %
Eosinophils Absolute: 0.1 10*3/uL (ref 0.0–0.5)
Eosinophils Relative: 1 %
HCT: 41.4 % (ref 39.0–52.0)
Hemoglobin: 12.3 g/dL — ABNORMAL LOW (ref 13.0–17.0)
Immature Granulocytes: 1 %
Lymphocytes Relative: 9 %
Lymphs Abs: 0.9 10*3/uL (ref 0.7–4.0)
MCH: 28.3 pg (ref 26.0–34.0)
MCHC: 29.7 g/dL — ABNORMAL LOW (ref 30.0–36.0)
MCV: 95.2 fL (ref 80.0–100.0)
Monocytes Absolute: 0.4 10*3/uL (ref 0.1–1.0)
Monocytes Relative: 4 %
Neutro Abs: 9 10*3/uL — ABNORMAL HIGH (ref 1.7–7.7)
Neutrophils Relative %: 85 %
Platelets: 116 10*3/uL — ABNORMAL LOW (ref 150–400)
RBC: 4.35 MIL/uL (ref 4.22–5.81)
RDW: 16.7 % — ABNORMAL HIGH (ref 11.5–15.5)
WBC: 10.4 10*3/uL (ref 4.0–10.5)
nRBC: 0 % (ref 0.0–0.2)

## 2021-08-19 LAB — HEPATIC FUNCTION PANEL
ALT: 12 U/L (ref 0–44)
AST: 11 U/L — ABNORMAL LOW (ref 15–41)
Albumin: 2.9 g/dL — ABNORMAL LOW (ref 3.5–5.0)
Alkaline Phosphatase: 71 U/L (ref 38–126)
Bilirubin, Direct: 0.1 mg/dL (ref 0.0–0.2)
Indirect Bilirubin: 0.5 mg/dL (ref 0.3–0.9)
Total Bilirubin: 0.6 mg/dL (ref 0.3–1.2)
Total Protein: 7 g/dL (ref 6.5–8.1)

## 2021-08-19 LAB — MAGNESIUM
Magnesium: 2.4 mg/dL (ref 1.7–2.4)
Magnesium: 2.2 mg/dL (ref 1.7–2.4)
Magnesium: 2.4 mg/dL (ref 1.7–2.4)

## 2021-08-19 LAB — PHOSPHORUS
Phosphorus: 1.9 mg/dL — ABNORMAL LOW (ref 2.5–4.6)
Phosphorus: 2.2 mg/dL — ABNORMAL LOW (ref 2.5–4.6)

## 2021-08-19 LAB — TSH: TSH: 1.823 u[IU]/mL (ref 0.350–4.500)

## 2021-08-19 LAB — PREALBUMIN: Prealbumin: 7.7 mg/dL — ABNORMAL LOW (ref 18–38)

## 2021-08-19 LAB — CK: Total CK: 91 U/L (ref 49–397)

## 2021-08-19 LAB — OSMOLALITY, URINE: Osmolality, Ur: 523 mOsm/kg (ref 300–900)

## 2021-08-19 LAB — SODIUM: Sodium: 162 mmol/L (ref 135–145)

## 2021-08-19 MED ORDER — DEXTROSE-NACL 5-0.45 % IV SOLN: INTRAVENOUS | Status: DC

## 2021-08-19 MED ORDER — ENSURE ENLIVE PO LIQD
237.0000 mL | Freq: Two times a day (BID) | ORAL | Status: DC
  Administered 2021-08-20: 237 mL via ORAL

## 2021-08-19 MED ORDER — HEPARIN SODIUM (PORCINE) 5000 UNIT/ML IJ SOLN: 5000.0000 [IU] | Freq: Three times a day (TID) | INTRAMUSCULAR | Status: DC

## 2021-08-19 MED ORDER — ACETAMINOPHEN 325 MG PO TABS
650.0000 mg | ORAL_TABLET | Freq: Four times a day (QID) | ORAL | Status: DC | PRN
  Administered 2021-08-20: 650 mg via ORAL
  Filled 2021-08-19: qty 2

## 2021-08-19 MED ORDER — POTASSIUM CHLORIDE 10 MEQ/100ML IV SOLN
10.0000 meq | INTRAVENOUS | Status: AC
  Administered 2021-08-19 (×2): 10 meq via INTRAVENOUS
  Filled 2021-08-19 (×2): qty 100

## 2021-08-19 MED ORDER — POTASSIUM PHOSPHATES 15 MMOLE/5ML IV SOLN
20.0000 mmol | Freq: Once | INTRAVENOUS | Status: AC
  Administered 2021-08-19: 20 mmol via INTRAVENOUS
  Filled 2021-08-19: qty 6.67

## 2021-08-19 MED ORDER — ACETAMINOPHEN 650 MG RE SUPP: 650.0000 mg | Freq: Four times a day (QID) | RECTAL | Status: DC | PRN

## 2021-08-19 MED ORDER — POTASSIUM CHLORIDE 2 MEQ/ML IV SOLN
INTRAVENOUS | Status: DC
  Filled 2021-08-19 (×2): qty 1000

## 2021-08-19 MED ORDER — BISACODYL 10 MG RE SUPP: 10.0000 mg | Freq: Every day | RECTAL | Status: DC | PRN

## 2021-08-19 MED ORDER — KCL IN DEXTROSE-NACL 40-5-0.45 MEQ/L-%-% IV SOLN
INTRAVENOUS | Status: DC
  Filled 2021-08-19: qty 1000

## 2021-08-19 MED ORDER — SODIUM CHLORIDE 0.9 % IV SOLN
1.0000 g | INTRAVENOUS | Status: DC
  Administered 2021-08-19 – 2021-08-21 (×3): 1 g via INTRAVENOUS
  Filled 2021-08-19: qty 10
  Filled 2021-08-19 (×2): qty 1

## 2021-08-19 MED ORDER — POTASSIUM CHLORIDE 10 MEQ/100ML IV SOLN
10.0000 meq | INTRAVENOUS | Status: AC
Start: 2021-08-19 — End: 2021-08-20
  Administered 2021-08-19 (×4): 10 meq via INTRAVENOUS
  Filled 2021-08-19 (×3): qty 100

## 2021-08-19 MED ORDER — ADULT MULTIVITAMIN W/MINERALS CH
1.0000 | ORAL_TABLET | Freq: Every day | ORAL | Status: DC
  Administered 2021-08-20: 1 via ORAL
  Filled 2021-08-19 (×2): qty 1

## 2021-08-19 MED ORDER — HEPARIN BOLUS VIA INFUSION
4000.0000 [IU] | Freq: Once | INTRAVENOUS | Status: AC
  Administered 2021-08-19: 4000 [IU] via INTRAVENOUS
  Filled 2021-08-19: qty 4000

## 2021-08-19 MED ORDER — ENOXAPARIN SODIUM 80 MG/0.8ML IJ SOSY
70.0000 mg | PREFILLED_SYRINGE | INTRAMUSCULAR | Status: DC
  Administered 2021-08-19 – 2021-08-21 (×3): 70 mg via SUBCUTANEOUS
  Filled 2021-08-19 (×3): qty 0.8

## 2021-08-19 MED ORDER — HEPARIN (PORCINE) 25000 UT/250ML-% IV SOLN
1100.0000 [IU]/h | INTRAVENOUS | Status: DC
  Administered 2021-08-19: 1100 [IU]/h via INTRAVENOUS
  Filled 2021-08-19: qty 250

## 2021-08-19 MED ORDER — POTASSIUM CHLORIDE 20 MEQ PO PACK
40.0000 meq | PACK | ORAL | Status: DC
  Filled 2021-08-19: qty 2

## 2021-08-19 MED ORDER — CARBIDOPA-LEVODOPA 25-250 MG PO TABS
1.0000 | ORAL_TABLET | Freq: Three times a day (TID) | ORAL | Status: DC
  Administered 2021-08-19 – 2021-08-20 (×4): 1 via ORAL
  Filled 2021-08-19 (×12): qty 1

## 2021-08-19 NOTE — Progress Notes (Signed)
Informed by Nira Conn RN who assisted this RN with pts care, that LUE IV access was dislodged at the time [facility] assessed site. Heparin gtt stopped.   MD [facility] notified and aware. IV team consulted for new IV access as pt is difficult stick. Will inform oncoming night shift nurse as well.

## 2021-08-19 NOTE — Progress Notes (Signed)
PROGRESS NOTE    Casey Fisher  [id number] DOB: [birth year] DOA: 08/18/2021 PCP: [name], MD   Chief Complaint  Patient presents with   Abnormal Labs   Brief Narrative: 87 year old male with history of prostate cancer, parkinsonism, neuromuscular disorder, memory impairment/dementia on Aricept, chronic anemia, peripheral neuropathy chronic diastolic CHF last echo [id number] G2 DD , BPH on Flomax recent hospital admission from 8/5-07/2010 for severe hypernatremia 166, hypokalemia, AKI from poor oral intake and discharged to skilled nursing facility sent from the rehab facility due to abnormal labs poor oral intake.  Patient unable to provide history on admission. In the ED sodium 163 potassium 2.1 lactic acidosis 3.1 appears very dehydrated no evidence of infection on chest x-ray and UA was pending.  UA came back with turbid, leukocyte large nitrate negative WBC more than 50 placed on ceftriaxone,IV fluids and admitted.   Subjective: Seen this morning.  Daughter at the bedside patient.  Patient eyes are closed and did not interact while daughter was in the room.  Daughter reports that he will not interact while she is ill she is in the room. After daughter left the room patient was will tell me his name and date of birth he was able to eat some applesauce.  Assessment & Plan:  Severe hypernatremia with volume depletion/dehydration: Continue volume resuscitation with IV D5 with half-normal saline given his poor oral intake.  Check serial BMP.  Continue IV thiamine  Acute metabolic encephalopathy at baseline with mild dementia but interactive per daughter.  Has been declining past 1 month.  Now more alert awake.  Suspect in the setting of his electrolyte imbalance dehydration.  Failure to thrive Severe protein calorie malnutrition with poor intake augment diet.Speech eval on dysphagia 3 diet  UTI: Continue ceftriaxone follow-up urine culture.  AKI: Prerenal due to poor  intake.  Baseline 1.1, improving on IV fluids. Recent Labs    08/03/21 0603 08/04/21 0311 08/04/21 1640 08/05/21 0245 08/05/21 1622 08/06/21 0410 08/07/21 0358 08/18/21 1822 08/19/21 0200 08/19/21 0712  BUN '14 15 14 12 13 11 10 '$ 53* 46* 37*  CREATININE 1.34* 1.60* 1.43* 1.26* 1.19 1.13 1.11 2.23* 1.86* 1.83*   Hypokalemia repleting IV with K-Phos IV fluids and IV KCl.  Repeat BMP today check mag. Recent Labs  Lab 08/18/21 1822 08/19/21 0200 08/19/21 0712  K 2.1* 2.9* <2.0*   Hypophosphatemia replacement ordered.  Repeat labs in the morning.  Abnormal KUB worrisome for ileus but no nausea vomiting tolerating p.o.  CT abdomen pelvis was done that showed mild gaseous distention of the colon suggestive of mild colonic ileus no evidence of obstruction, cholelithiasis.  Continue supportive measures.   Constipation-dulcolax rectally  Prostate cancer  BPH Continue home meds  HLD holding meds for now  Dementia at baseline alert awake and eating a month ago.  Continue Aricept supportive care.  Low albumin level/leg edema: Augment nutrition. Edema from dvt too,  DVT in right lower extremity-ultrasound shows age-indeterminate deep vein thrombosis involving the right femoral vein right popliteal vein right posterior tibial vein and right peroneal vein.  And chronic deep vein thrombosis on the right external iliac vein, common femoral vein SF junction and right femoral vein.  Starting heparin drip for now.  Chronic diastolic CHF: Currently volume depleted.  Goals of care: Currently DNR.  Given his advanced age dementia poor oral intake overall guarded prognosis, has had recurrent admissions for similar issues.  Briefly discussed with patient's daughter she would like to talk with  palliative care.palliative care consult pending.  Diet Order             DIET DYS 3 Room service appropriate? Yes; Fluid consistency: Thin  Diet effective now                  Patient's Body mass  index is 18.49 kg/m.  DVT prophylaxis: heparin injection 5,000 Units Start: 08/19/21 1400 Code Status:   Code Status: DNR  Family Communication: plan of care discussed with patient's daughter at bedside. She is the [employer] and POA per her and comes form [facility]. Status is: Inpatient Remains inpatient appropriate because:IV treatments appropriate due to intensity of illness or inability to take PO and Inpatient level of care appropriate due to severity of illness Dispo: The patient is from: SNF              Anticipated d/c is to:  TBD              Patient currently is not medically stable to d/c.   Difficult to place patient No  Unresulted Labs (From admission, onward)     Start     Ordered   08/20/21 0500  Phosphorus  Tomorrow morning,   R        08/19/21 0859   08/20/21 0500  Magnesium  Tomorrow morning,   R        08/19/21 0859   08/19/21 1300  TSH  Tomorrow morning,   R        08/19/21 0758   08/19/21 1300  Prealbumin  Tomorrow morning,   R        08/19/21 0758   08/19/21 0208  Urine Culture  Once,   STAT       Question:  Indication  Answer:  Altered mental status (if no other cause identified)   08/19/21 0208   08/19/21 [id number]  Basic metabolic panel  Now then every 6 hours,   R (with STAT occurrences)      08/18/21 2032          medications reviewed:  Scheduled Meds:  carbidopa-levodopa  1 tablet Oral TID   heparin  5,000 Units Subcutaneous Q8H   potassium chloride  40 mEq Oral Q3H   thiamine injection  100 mg Intravenous Daily   Continuous Infusions:  cefTRIAXone (ROCEPHIN)  IV 1 g (08/19/21 0255)   dextrose 5 % and 0.45% NaCl 100 mL/hr at 08/19/21 0908   potassium chloride     potassium PHOSPHATE IVPB (in mmol)     Consultants:see note  Procedures:see note Antimicrobials: Anti-infectives (From admission, onward)    Start     Dose/Rate Route Frequency Ordered Stop   08/19/21 0215  cefTRIAXone (ROCEPHIN) 1 g in sodium chloride 0.9 % 100 mL IVPB        1  g 200 mL/hr over 30 Minutes Intravenous Every 24 hours 08/19/21 0206        Culture/Microbiology    Component Value Date/Time   SDES  06/07/2021 1917    URINE, RANDOM Performed at [facility], [place] [address] [place], [place] [address]    SPECREQUEST  06/07/2021 1917    NONE Performed at [facility], [place] [address], [place], [place] [address]    CULT (A) 06/07/2021 1917    <10,000 COLONIES/mL INSIGNIFICANT GROWTH Performed at [facility] [address] [place], [place] [address]    REPTSTATUS 06/09/2021 FINAL 06/07/2021 1917    Other culture-see  note  Objective: Vitals: Today's Vitals   08/19/21 0250 08/19/21 0420 08/19/21 0908 08/19/21 1027  BP:  (!) 154/86  (!) 159/77  Pulse: (!) 57 (!) 48  (!) 48  Resp: '20 18  18  '$ Temp:    98.5 F (36.9 C)  TempSrc:    Oral  SpO2: 95% 95%  100%  Weight:   67.1 kg   Height:   '6\' 3"'$  (1.905 m)   PainSc:        Intake/Output Summary (Last 24 hours) at 08/19/2021 1033 Last data filed at 08/19/2021 0229 Gross per 24 hour  Intake 2455.44 ml  Output --  Net 2455.44 ml   Filed Weights   08/19/21 0908  Weight: 67.1 kg   Weight change:   Intake/Output from previous day: 08/22 0701 - 08/23 0700 In: 2455.4 [IV Piggyback:2455.4] Out: -  Intake/Output this shift: No intake/output data recorded. Filed Weights   08/19/21 0908  Weight: 67.1 kg   Examination: General exam: AAO to self his date of birth otherwise unable to provide any history.  Elderly frail. HEENT:Oral mucosa moist, Ear/Nose WNL grossly,dentition normal. Respiratory system: bilaterally diminished, no use of accessory muscle, non tender. Cardiovascular system: S1 & S2 +,No JVD. Gastrointestinal system: Abdomen soft, NT,ND, BS+. Nervous System:Alert, awake, moving extremities Extremities: Rt  LE swollen, distal peripheral pulses palpable.  Skin: No rashes,no icterus. MSK: Normal muscle bulk,tone, power Data Reviewed:  I have personally reviewed following labs and imaging studies.  CBC: Recent Labs  Lab 08/18/21 1822 08/19/21 0712  WBC 11.2* 10.4  NEUTROABS 9.7* 9.0*  HGB 13.8 12.3*  HCT 45.2 41.4  MCV 92.8 95.2  PLT 141* [id number]*   Basic Metabolic Panel: Recent Labs  Lab 08/18/21 1822 08/19/21 0200 08/19/21 0712  NA 164* 160* 160*  K 2.1* 2.9* <2.0*  CL 125* 130* 129*  CO2 '26 23 23  '$ GLUCOSE 144* 148* 136*  BUN 53* 46* 37*  CREATININE 2.23* 1.86* 1.83*  CALCIUM 9.3 8.2* 8.5*  MG  --  2.4 2.2  PHOS  --  1.9* 2.2*   GFR: Estimated Creatinine Clearance: 25 mL/min (A) (by C-G formula based on SCr of 1.83 mg/dL (H)). Liver Function Tests: Recent Labs  Lab 08/18/21 1822 08/19/21 0712  AST 16 11*  ALT 13 12  ALKPHOS 78 71  BILITOT 0.6 0.6  PROT 7.9 7.0  ALBUMIN 3.2* 2.9*   No results for input(s): LIPASE, AMYLASE in the last 168 hours. No results for input(s): AMMONIA in the last 168 hours. Coagulation Profile: Recent Labs  Lab 08/18/21 1822  INR 1.2   Cardiac Enzymes: Recent Labs  Lab 08/19/21 0200  CKTOTAL 91   BNP (last 3 results) No results for input(s): PROBNP in the last 8760 hours. HbA1C: No results for input(s): HGBA1C in the last 72 hours. CBG: No results for input(s): GLUCAP in the last 168 hours. Lipid Profile: No results for input(s): CHOL, HDL, LDLCALC, TRIG, CHOLHDL, LDLDIRECT in the last 72 hours. Thyroid Function Tests: No results for input(s): TSH, T4TOTAL, FREET4, T3FREE, THYROIDAB in the last 72 hours. Anemia Panel: No results for input(s): VITAMINB12, FOLATE, FERRITIN, TIBC, IRON, RETICCTPCT in the last 72 hours. Sepsis Labs: Recent Labs  Lab 08/18/21 1822 08/18/21 2210  LATICACIDVEN 3.4* 2.0*    Recent Results (from the past 240 hour(s))  Resp Panel by RT-PCR (Flu A&B, Covid) Nasopharyngeal Swab     Status: None   Collection Time: 08/18/21  6:20 PM   Specimen: Nasopharyngeal Swab; Nasopharyngeal(NP)  swabs in vial transport medium  Result  Value Ref Range Status   SARS Coronavirus 2 by RT PCR NEGATIVE NEGATIVE Final    Comment: (NOTE) SARS-CoV-2 target nucleic acids are NOT DETECTED.  The SARS-CoV-2 RNA is generally detectable in upper respiratory specimens during the acute phase of infection. The lowest concentration of SARS-CoV-2 viral copies this assay can detect is 138 copies/mL. A negative result does not preclude SARS-Cov-2 infection and should not be used as the sole basis for treatment or other patient management decisions. A negative result may occur with  improper specimen collection/handling, submission of specimen other than nasopharyngeal swab, presence of viral mutation(s) within the areas targeted by this assay, and inadequate number of viral copies(<138 copies/mL). A negative result must be combined with clinical observations, patient history, and epidemiological information. The expected result is Negative.  Fact Sheet for Patients:  EntrepreneurPulse.com.au  Fact Sheet for Healthcare Providers:  IncredibleEmployment.be  This test is no t yet approved or cleared by the [place] FDA and  has been authorized for detection and/or diagnosis of SARS-CoV-2 by FDA under an Emergency Use Authorization (EUA). This EUA will remain  in effect (meaning this test can be used) for the duration of the COVID-19 declaration under Section 564(b)(1) of the Act, 21 U.S.C.section 360bbb-3(b)(1), unless the authorization is terminated  or revoked sooner.       Influenza A by PCR NEGATIVE NEGATIVE Final   Influenza B by PCR NEGATIVE NEGATIVE Final    Comment: (NOTE) The Xpert Xpress SARS-CoV-2/FLU/RSV plus assay is intended as an aid in the diagnosis of influenza from Nasopharyngeal swab specimens and should not be used as a sole basis for treatment. Nasal washings and aspirates are unacceptable for Xpert Xpress SARS-CoV-2/FLU/RSV testing.  Fact Sheet for  Patients: EntrepreneurPulse.com.au  Fact Sheet for Healthcare Providers: IncredibleEmployment.be  This test is not yet approved or cleared by the [place] FDA and has been authorized for detection and/or diagnosis of SARS-CoV-2 by FDA under an Emergency Use Authorization (EUA). This EUA will remain in effect (meaning this test can be used) for the duration of the COVID-19 declaration under Section 564(b)(1) of the Act, 21 U.S.C. section 360bbb-3(b)(1), unless the authorization is terminated or revoked.  Performed at [facility], [place] [address], [place], [place] [address]      Radiology Studies: CT ABDOMEN PELVIS WO CONTRAST  Result Date: 08/19/2021 CLINICAL DATA:  Bowel obstruction, anorexia, abdominal distension EXAM: CT ABDOMEN AND PELVIS WITHOUT CONTRAST TECHNIQUE: Multidetector CT imaging of the abdomen and pelvis was performed following the standard protocol without IV contrast. COMPARISON:  10/29/2016 FINDINGS: Lower chest: Visualized lung bases are clear bilaterally. Mild coronary artery calcification. Global cardiac size within normal limits. Hepatobiliary: Cholelithiasis without pericholecystic inflammatory change. Stable subserosal cyst noted within the left hepatic lobe. Liver is otherwise unremarkable. No intra or extrahepatic biliary ductal dilation. Pancreas: Unremarkable Spleen: Unremarkable Adrenals/Urinary Tract: The adrenal glands are unremarkable. The kidneys are mildly atrophic, but are otherwise unremarkable. The bladder is distended but is otherwise unremarkable. Stomach/Bowel: There is mild gaseous distension of the a colon most in keeping with a mild colonic ileus. No evidence of obstruction. Fluid within the rectal vault may present clinically as diarrhea. High density material within the rectal vault may relate to prior contrast study or enema. The stomach, small bowel, and large bowel are otherwise  unremarkable. Appendix normal. No free intraperitoneal gas or fluid. Vascular/Lymphatic: Moderate aortoiliac atherosclerotic calcification. No aortic aneurysm. No pathologic adenopathy within the  abdomen and pelvis. Reproductive: Brachytherapy seeds seen within the prostate gland. Other: Tiny fat containing umbilical hernia. Musculoskeletal: Degenerative changes are seen within the lumbar spine. Moderate lumbar dextroscoliosis noted. No acute bone abnormality. IMPRESSION: Mild gaseous distension of the colon suggesting a mild colonic ileus. No evidence of obstruction. Fluid within the rectal vault possibly presenting clinically as diarrhea. Cholelithiasis. Mild bladder distension possibly related to voluntary retention or bladder outlet obstruction. Aortic Atherosclerosis (ICD10-I70.0). Electronically Signed   By: Fidela Salisbury M.D.   On: 08/19/2021 03:16   DG Abd 1 View  Result Date: 08/18/2021 CLINICAL DATA:  Abdominal distension. EXAM: ABDOMEN - 1 VIEW COMPARISON:  None. FINDINGS: Diffuse gaseous distension of the colon may represent ileus. Clinical correlation is recommended. No definite small bowel dilatation. Evaluation however is limited on the provided images. No free air identified. There is degenerative changes of the spine. No acute osseous pathology. IMPRESSION: Diffuse gaseous distension of the colon may represent adynamic ileus. Clinical correlation is recommended. Electronically Signed   By: Anner Crete M.D.   On: 08/18/2021 21:27   DG Chest Port 1 View  Result Date: 08/18/2021 CLINICAL DATA:  Dyspnea. EXAM: PORTABLE CHEST 1 VIEW COMPARISON:  08/01/2021 FINDINGS: Stable cardiomediastinal contours. Unchanged asymmetric elevation of the left hemidiaphragm. Atelectasis is noted in the left base. Asymmetric increase interstitial markings are noted within the left lung. Right lung appears clear. IMPRESSION: 1. Asymmetric increase interstitial markings in the left lung may represent asymmetric  edema or atypical infection. 2. Stable asymmetric elevation of left hemidiaphragm. Electronically Signed   By: Kerby Moors M.D.   On: 08/18/2021 18:19     LOS: 1 day   Antonieta Pert, MD [facility]  08/19/2021, 10:33 AM

## 2021-08-19 NOTE — Progress Notes (Signed)
BLE venous duplex has been completed.  Preliminary findings given to [place], Therapist, sports.  Results can be found under chart review under CV PROC. 08/19/2021 12:02 PM Jalesha Plotz RVT, RDMS

## 2021-08-19 NOTE — Evaluation (Signed)
SLP Cancellation Note  Patient Details Name: Casey Fisher MRN: [id number] DOB: [birth year]   Cancelled treatment:       Reason Eval/Treat Not Completed: Other (comment) (Pt meeting with palliative at this time, RN reports pt only accepting medication with applesauce - crushed otherwise no po intake; SLP to continue efforts)  Kathleen Lime, MS [facility] SLP Acute Rehab Services Office [phone] Pager [phone]   Macario Golds 08/19/2021, 5:30 PM

## 2021-08-19 NOTE — ED Notes (Signed)
Report called to floor nurse

## 2021-08-19 NOTE — Progress Notes (Signed)
ANTICOAGULATION CONSULT NOTE - Initial Consult  Pharmacy Consult for heparin Indication: DVT  Allergies  Allergen Reactions   Lyrica [Pregabalin] Other (See Comments)    "constipation"    Patient Measurements: Height: '6\' 3"'$  (190.5 cm) Weight: 67.1 kg (147 lb 14.9 oz) IBW/kg (Calculated) : 84.5 Heparin Dosing Weight: 67.1 kg  Vital Signs: Temp: 98.5 F (36.9 C) (08/23 1027) Temp Source: Oral (08/23 1027) BP: 159/77 (08/23 1027) Pulse Rate: 48 (08/23 1027)  Labs: Recent Labs    08/18/21 1822 08/19/21 0200 08/19/21 0712  HGB 13.8  --  12.3*  HCT 45.2  --  41.4  PLT 141*  --  116*  LABPROT 15.6*  --   --   INR 1.2  --   --   CREATININE 2.23* 1.86* 1.83*  CKTOTAL  --  91  --     Estimated Creatinine Clearance: 25 mL/min (A) (by C-G formula based on SCr of 1.83 mg/dL (H)).   Medical History: Past Medical History:  Diagnosis Date   Abnormality of gait 03/24/2016   Anxiety    Cancer (HCC)    Cataract    Depression    Heart murmur    Hereditary and idiopathic peripheral neuropathy 03/24/2016   Hypertension    Memory difficulty 03/24/2016   Neuromuscular disorder ([place])    Neuropathy    Parkinson's disease ([place]) 05/07/2021   Prostate cancer ([place])    remission    Medications:  Scheduled:   carbidopa-levodopa  1 tablet Oral TID   heparin  4,000 Units Intravenous Once   thiamine injection  100 mg Intravenous Daily   Infusions:   cefTRIAXone (ROCEPHIN)  IV 1 g (08/19/21 0255)   dextrose 5 % and 0.45 % NaCl with KCl 40 mEq/L     heparin     potassium chloride     potassium PHOSPHATE IVPB (in mmol)      Assessment: 87 year old male presenting with poor oral intake and abnormal labs.  Patient with leg swelling - ultrasound showed age-indeterminate DVT involving R femoral vein, R popliteal vein, R posterior tibial vein, and R peroneal vein; chronic DVT on R external iliac vein, common femoral vein SF junction and R femoral vein.  No anticoagulation noted PTA and  no doses of DVT ppx given.  Pharmacy consulted to dose heparin.   Baseline INR 1.2, Hgb stable, Platelets 141>116.  Goal of Therapy:  Heparin level 0.3-0.7 units/ml Monitor platelets by anticoagulation protocol: Yes   Plan:  Give heparin 4000 units bolus x 1 Start heparin infusion at 1100 units/hr Check anti-Xa level in 8 hours and daily while on heparin Continue to monitor H&H and platelets F/u ability to transition to oral anticoagulant  Dimple Nanas, PharmD 08/19/2021 1:22 PM

## 2021-08-19 NOTE — Progress Notes (Signed)
ANTICOAGULATION CONSULT NOTE - Initial Consult  Pharmacy Consult for Enoxaparin Indication: DVT  Allergies  Allergen Reactions   Lyrica [Pregabalin] Other (See Comments)    "constipation"    Patient Measurements: Height: '6\' 3"'$  (190.5 cm) Weight: 67.1 kg (147 lb 14.9 oz) IBW/kg (Calculated) : 84.5 Heparin Dosing Weight: 67.1 kg  Vital Signs: Temp: 97.9 F (36.6 C) (08/23 1843) Temp Source: Oral (08/23 1843) BP: 163/117 (08/23 1843) Pulse Rate: 89 (08/23 1843)  Labs: Recent Labs    08/18/21 1822 08/19/21 0200 08/19/21 0712 08/19/21 1256 08/19/21 1647  HGB 13.8  --  12.3*  --   --   HCT 45.2  --  41.4  --   --   PLT 141*  --  116*  --   --   LABPROT 15.6*  --   --   --   --   INR 1.2  --   --   --   --   CREATININE 2.23* 1.86* 1.83* 1.75* 1.76*  CKTOTAL  --  91  --   --   --      Estimated Creatinine Clearance: 25.9 mL/min (A) (by C-G formula based on SCr of 1.76 mg/dL (H)).   Assessment: 87 year old male presenting with poor oral intake and abnormal labs.  Patient with leg swelling - ultrasound showed age-indeterminate DVT involving R femoral vein, R popliteal vein, R posterior tibial vein, and R peroneal vein; chronic DVT on R external iliac vein, common femoral vein SF junction and R femoral vein.  No anticoagulation noted PTA and no doses of DVT ppx given.  Pharmacy consulted to dose heparin.  8/23 PM:  patient has lost IV access, pharmacy is consulted to transition to Lovenox for VTE treatment. SCr elevated at 1.76 with CrCl ~ 25 ml/min CBC:  Hgb low/stable at 12.3; Plt remain low/stable at 116  Goal of Therapy:  Anti-Xa level 0.6-1 units/ml 4hrs after LMWH dose given Monitor platelets by anticoagulation protocol: Yes   Plan:  Lovenox 1 mg/kg ('70mg'$ ) [place] q24h Follow up IV access and long-term anticoagulation plans.  Gretta Arab PharmD, BCPS Clinical Pharmacist [facility] main pharmacy [phone] 08/19/2021 7:32 PM

## 2021-08-20 DIAGNOSIS — N179 Acute kidney failure, unspecified: Secondary | ICD-10-CM

## 2021-08-20 DIAGNOSIS — N390 Urinary tract infection, site not specified: Secondary | ICD-10-CM | POA: Diagnosis not present

## 2021-08-20 DIAGNOSIS — E870 Hyperosmolality and hypernatremia: Secondary | ICD-10-CM | POA: Diagnosis not present

## 2021-08-20 DIAGNOSIS — G20 Parkinson's disease: Secondary | ICD-10-CM | POA: Diagnosis not present

## 2021-08-20 DIAGNOSIS — E860 Dehydration: Secondary | ICD-10-CM | POA: Diagnosis not present

## 2021-08-20 LAB — BASIC METABOLIC PANEL WITH GFR
Anion gap: 9 (ref 5–15)
BUN: 37 mg/dL — ABNORMAL HIGH (ref 8–23)
CO2: 22 mmol/L (ref 22–32)
Calcium: 8.2 mg/dL — ABNORMAL LOW (ref 8.9–10.3)
Chloride: >130 mmol/L (ref 98–111)
Creatinine, Ser: 1.6 mg/dL — ABNORMAL HIGH (ref 0.61–1.24)
GFR, Estimated: 40 mL/min — ABNORMAL LOW (ref >60)
Glucose, Bld: 148 mg/dL — ABNORMAL HIGH (ref 70–99)
Potassium: 2.8 mmol/L — ABNORMAL LOW (ref 3.5–5.1)
Sodium: 162 mmol/L (ref 135–145)

## 2021-08-20 LAB — COMPREHENSIVE METABOLIC PANEL WITH GFR
ALT: 8 U/L (ref 0–44)
AST: 20 U/L (ref 15–41)
Albumin: 2.9 g/dL — ABNORMAL LOW (ref 3.5–5.0)
Alkaline Phosphatase: 79 U/L (ref 38–126)
Anion gap: 9 (ref 5–15)
BUN: 31 mg/dL — ABNORMAL HIGH (ref 8–23)
CO2: 23 mmol/L (ref 22–32)
Calcium: 8.8 mg/dL — ABNORMAL LOW (ref 8.9–10.3)
Chloride: 127 mmol/L — ABNORMAL HIGH (ref 98–111)
Creatinine, Ser: 1.46 mg/dL — ABNORMAL HIGH (ref 0.61–1.24)
GFR, Estimated: 45 mL/min — ABNORMAL LOW (ref >60)
Glucose, Bld: 166 mg/dL — ABNORMAL HIGH (ref 70–99)
Potassium: 3.9 mmol/L (ref 3.5–5.1)
Sodium: 159 mmol/L — ABNORMAL HIGH (ref 135–145)
Total Bilirubin: 0.5 mg/dL (ref 0.3–1.2)
Total Protein: 7.3 g/dL (ref 6.5–8.1)

## 2021-08-20 LAB — CBC
HCT: 39.4 % (ref 39.0–52.0)
Hemoglobin: 12.2 g/dL — ABNORMAL LOW (ref 13.0–17.0)
MCH: 28.4 pg (ref 26.0–34.0)
MCHC: 31 g/dL (ref 30.0–36.0)
MCV: 91.8 fL (ref 80.0–100.0)
Platelets: 119 10*3/uL — ABNORMAL LOW (ref 150–400)
RBC: 4.29 MIL/uL (ref 4.22–5.81)
RDW: 16.9 % — ABNORMAL HIGH (ref 11.5–15.5)
WBC: 10 10*3/uL (ref 4.0–10.5)
nRBC: 0 % (ref 0.0–0.2)

## 2021-08-20 LAB — BASIC METABOLIC PANEL
BUN: 33 mg/dL — ABNORMAL HIGH (ref 8–23)
CO2: 25 mmol/L (ref 22–32)
Calcium: 8.4 mg/dL — ABNORMAL LOW (ref 8.9–10.3)
Chloride: >130 mmol/L (ref 98–111)
Creatinine, Ser: 1.43 mg/dL — ABNORMAL HIGH (ref 0.61–1.24)
GFR, Estimated: 46 mL/min — ABNORMAL LOW (ref >60)
Glucose, Bld: 140 mg/dL — ABNORMAL HIGH (ref 70–99)
Potassium: 2.5 mmol/L — CL (ref 3.5–5.1)
Sodium: 165 mmol/L (ref 135–145)

## 2021-08-20 LAB — URINE CULTURE: Culture: >=100000 — AB

## 2021-08-20 LAB — SODIUM: Sodium: 160 mmol/L — ABNORMAL HIGH (ref 135–145)

## 2021-08-20 LAB — MAGNESIUM: Magnesium: 2.1 mg/dL (ref 1.7–2.4)

## 2021-08-20 LAB — PHOSPHORUS: Phosphorus: 3.2 mg/dL (ref 2.5–4.6)

## 2021-08-20 MED ORDER — POTASSIUM CHLORIDE 10 MEQ/100ML IV SOLN
10.0000 meq | Freq: Once | INTRAVENOUS | Status: AC
  Administered 2021-08-20: 10 meq via INTRAVENOUS
  Filled 2021-08-20: qty 100

## 2021-08-20 MED ORDER — DONEPEZIL HCL 5 MG PO TABS
5.0000 mg | ORAL_TABLET | Freq: Every day | ORAL | Status: DC
  Filled 2021-08-20: qty 1

## 2021-08-20 MED ORDER — POTASSIUM CHLORIDE 10 MEQ/100ML IV SOLN
10.0000 meq | INTRAVENOUS | Status: AC
  Administered 2021-08-20 (×2): 10 meq via INTRAVENOUS
  Filled 2021-08-20: qty 100

## 2021-08-20 MED ORDER — AMLODIPINE BESYLATE 10 MG PO TABS
10.0000 mg | ORAL_TABLET | Freq: Every day | ORAL | Status: DC
  Administered 2021-08-20: 10 mg via ORAL
  Filled 2021-08-20: qty 1

## 2021-08-20 MED ORDER — POTASSIUM CL IN DEXTROSE 5% 20 MEQ/L IV SOLN
20.0000 meq | INTRAVENOUS | Status: DC
  Administered 2021-08-20 – 2021-08-21 (×2): 20 meq via INTRAVENOUS
  Filled 2021-08-20 (×3): qty 1000

## 2021-08-20 MED ORDER — POTASSIUM CHLORIDE CRYS ER 20 MEQ PO TBCR
40.0000 meq | EXTENDED_RELEASE_TABLET | ORAL | Status: AC
  Administered 2021-08-20: 40 meq via ORAL
  Filled 2021-08-20: qty 2

## 2021-08-20 MED ORDER — DULOXETINE HCL 30 MG PO CPEP: 30.0000 mg | ORAL_CAPSULE | Freq: Two times a day (BID) | ORAL | Status: DC

## 2021-08-20 MED ORDER — POTASSIUM CHLORIDE CRYS ER 20 MEQ PO TBCR
40.0000 meq | EXTENDED_RELEASE_TABLET | Freq: Once | ORAL | Status: AC
  Administered 2021-08-20: 40 meq via ORAL
  Filled 2021-08-20: qty 2

## 2021-08-20 MED ORDER — POTASSIUM CHLORIDE 10 MEQ/100ML IV SOLN
10.0000 meq | INTRAVENOUS | Status: AC
  Administered 2021-08-20 (×4): 10 meq via INTRAVENOUS
  Filled 2021-08-20 (×4): qty 100

## 2021-08-20 MED ORDER — DULOXETINE HCL 30 MG PO CPEP
30.0000 mg | ORAL_CAPSULE | Freq: Every day | ORAL | Status: DC
  Administered 2021-08-20: 30 mg via ORAL
  Filled 2021-08-20: qty 1

## 2021-08-20 MED ORDER — POTASSIUM CHLORIDE 10 MEQ/100ML IV SOLN
10.0000 meq | INTRAVENOUS | Status: AC
  Administered 2021-08-20 (×2): 10 meq via INTRAVENOUS
  Filled 2021-08-20 (×2): qty 100

## 2021-08-20 NOTE — Evaluation (Signed)
SLP Cancellation Note  Patient Details Name: PONCE GAMARRA MRN: [id number] DOB: 1928-03-14   Cancelled treatment:       Reason Eval/Treat Not Completed: Other (comment) (per notes, daughter interested in hospice house, await clarification from palliative regarding care plan and if swallow evaluation desired)  Will follow up next date.   Kathleen Lime, MS [facility] SLP Acute Rehab Services Office [phone] Pager [phone]  Macario Golds 08/20/2021, 4:06 PM

## 2021-08-20 NOTE — Progress Notes (Signed)
Initial Nutrition Assessment  DOCUMENTATION CODES:   Severe malnutrition in context of chronic illness  INTERVENTION:  - will order Ensure Plus BID, each supplement provides 350 kcal and 13 grams of protein. - will order Magic Cup BID with meals, each supplement provides 290 kcal and 9 grams of protein. - will order 1 tablet multivitamin with minerals/day.    NUTRITION DIAGNOSIS:   Severe Malnutrition related to chronic illness (Parkinson's) as evidenced by severe fat depletion, severe muscle depletion.  GOAL:   Patient will meet greater than or equal to 90% of their needs  MONITOR:   PO intake, Supplement acceptance, Labs, Weight trends  REASON FOR ASSESSMENT:   Malnutrition Screening Tool, Consult Assessment of nutrition requirement/status  ASSESSMENT:   87 year old male with medical history of prostate cancer, parkinson's disease, neuromuscular disorder, memory impairment/dementia on Aricept, chronic anemia, peripheral neuropathy, CHF, and BPH. He was admitted 8/5-8/11 at [facility] for severe hypernatremia, hypokalemia, and AKI. He was discharged to SNF. He presented to the ED from facility d/t poor PO intakes and abnormal lab results.  Patient laying in bed with daughter at bedside. Patient was seen by a RD at [facility] ~2 weeks ago.   Patient unable to provide much information although he did aid in selecting what he wanted for a late lunch; RD entered lunch order for patient.   Daughter reports that patient's PO intakes have remained poor since PTA at [facility] ~2 weeks ago. Patient does need some assistance with feeding. He previously had an upper and lower denture which are slightly loose, but that lower denture was lost during previous admission.   She reports that patient was being given Dysphagia 1 type foods at SNF/rehab and that she feels part of poor intakes was due to him disliking the texture of the food.  Patient has been mainly non-ambulatory recently. He  is able to transfer from bed to wheelchair or chair and back, but does not walk otherwise.  Weight today is 148 lb and weight on 06/07/21 was 166 lb. This indicates 18 lb weight loss (10.8% body weight) in the past 2.5 months; significant for time frame.     Labs reviewed; Na: 160 mmol/l, K: 2.8 mmol/l, Cl: >130 mmol/l, BUN: 37 mg/dl, creatinine: 1.6 mg/dl, Ca: 8.2 mg/dl, GFR: 40 ml/min.  Medications reviewed; 10 mEq IV KCl x2 runs 8/24, 20 mmol IV KPhos x1 run 8/23, 100 mg IV thiamine/day.  IVF; D-40 mEq IV KCl @ 75 ml/hr (306 kcal/24 hrs).     NUTRITION - FOCUSED PHYSICAL EXAM:  Flowsheet Row Most Recent Value  Orbital Region Moderate depletion  Upper Arm Region Severe depletion  Thoracic and Lumbar Region Moderate depletion  Buccal Region Severe depletion  Temple Region Moderate depletion  Clavicle Bone Region Severe depletion  Clavicle and Acromion Bone Region Severe depletion  Scapular Bone Region Unable to assess  Dorsal Hand Moderate depletion  Patellar Region Moderate depletion  Anterior Thigh Region Moderate depletion  Posterior Calf Region Moderate depletion  Edema (RD Assessment) Mild  [BLE]  Hair Reviewed  Eyes Reviewed  Mouth Reviewed  Skin Reviewed  Nails Reviewed       Diet Order:   Diet Order             DIET DYS 3 Room service appropriate? Yes; Fluid consistency: Thin  Diet effective now                   EDUCATION NEEDS:   Not appropriate for  education at this time  Skin:  Skin Assessment: Reviewed RN Assessment  Last BM:  PTA/unknown  Height:   Ht Readings from Last 1 Encounters:  08/19/21 '6\' 3"'$  (1.905 m)    Weight:   Wt Readings from Last 1 Encounters:  08/19/21 67.1 kg     Estimated Nutritional Needs:  Kcal:  1900-2100 kcal Protein:  90-105 grams Fluid:  >/= 2.2 L/day     Jarome Matin, MS, RD, LDN, CNSC Inpatient Clinical Dietitian RD pager # available in [facility]  After hours/weekend pager # available in [facility]

## 2021-08-20 NOTE — Evaluation (Addendum)
Physical Therapy Evaluation Patient Details Name: Casey Fisher MRN: [id number] DOB: Jan 13, 1928 Today's Date: 08/20/2021   History of Present Illness  87 yo male admitted from ALF with hypernatremia, hypokalemia, R LE DVT. Hx of dementia, MD d/o, Parkinson's, prostate CA, CHF  Clinical Impression  On eval, pt required Total A +2 for mobility. He sat EOB for ~ 5 minutes with varying level of assist to maintain static sitting balance. Attempted standing with 2 HHA-pt unable. No family present during session. Recommend SNF.     Follow Up Recommendations SNF;Supervision/Assistance - 24 hour (vs possible residential hospice?)    Equipment Recommendations  Wheelchair (measurements PT);Wheelchair cushion (measurements PT);Hospital bed (hoyer lift)    Recommendations for Other Services       Precautions / Restrictions Precautions Precautions: Fall Precaution Comments: Bladder/bowel incontinence; bilateral soft mitts Restrictions Weight Bearing Restrictions: No      Mobility  Bed Mobility Overal bed mobility: Needs Assistance Bed Mobility: Rolling;Supine to Sit;Sit to Supine Rolling: Total assist;+2 for physical assistance   Supine to sit: +2 for physical assistance;HOB elevated;Total assist Sit to supine: Total assist;+2 for physical assistance;+2 for safety/equipment;HOB elevated   General bed mobility comments: Assist for trunk and bil LEs. Utilized bedpad to help with positioning    Transfers Overall transfer level: Needs assistance Equipment used: 2 person hand held assist Transfers: Sit to/from Stand Sit to Stand: Total assist;+2 physical assistance;+2 safety/equipment;From elevated surface         General transfer comment: Attempted-pt just barely able to clear bottom from bed surface-unable to successfully get into standing  Ambulation/Gait                Stairs            Wheelchair Mobility    Modified Rankin (Stroke Patients Only)        Balance Overall balance assessment: Needs assistance Sitting-balance support: Bilateral upper extremity supported;Feet supported   Sitting balance - Comments: Pt tends to maintain flexed forward posture with leaning to R side. able to correct with multimodal cueing but he doesn't maintain                                     Pertinent Vitals/Pain Pain Assessment: Faces Faces Pain Scale: No hurt    Home Living Family/patient expects to be discharged to:: Private residence Living Arrangements: Other relatives Available Help at Discharge: Family;Available 24 hours/day Type of Home: House Home Access: Stairs to enter   [employer] of Steps: 2   Home Equipment: Seth Ward - 2 wheels;Walker - standard;Bedside commode;Wheelchair - manual      Prior Function Level of Independence: Needs assistance   Gait / Transfers Assistance Needed: Has been having more difficulty walking lately, used to walk household distances with a shuffling gait pattern using a RW with assistance. Needs assistance for bed mobility and transfers, +1.  ADL's / Homemaking Assistance Needed: Needs assistance for all ADLs.  Comments: per chart review, was ambulatory in 03/2021     Hand Dominance        Extremity/Trunk Assessment   Upper Extremity Assessment Upper Extremity Assessment: Defer to OT evaluation    Lower Extremity Assessment Lower Extremity Assessment: Generalized weakness    Cervical / Trunk Assessment Cervical / Trunk Assessment: Kyphotic  Communication   Communication: HOH  Cognition Arousal/Alertness: Awake/alert Behavior During Therapy: Flat affect Overall Cognitive Status: History of cognitive impairments - at  baseline                                 General Comments: Pt with inconsistent following of simple commands; minimal verbal responses to answers      General Comments      Exercises     Assessment/Plan    PT Assessment Patient  needs continued PT services  PT Problem List Decreased strength;Decreased mobility;Decreased range of motion;Decreased activity tolerance;Decreased balance;Decreased knowledge of use of DME       PT Treatment Interventions DME instruction;Gait training;Therapeutic exercise;Balance training;Functional mobility training;Therapeutic activities;Patient/family education    PT Goals (Current goals can be found in the Care Plan section)  Acute Rehab PT Goals Patient Stated Goal: pt unable to state PT Goal Formulation: Patient unable to participate in goal setting Time For Goal Achievement: 09/03/21 Potential to Achieve Goals: Poor    Frequency Min 2X/week   Barriers to discharge        Co-evaluation PT/OT/SLP Co-Evaluation/Treatment: Yes Reason for Co-Treatment: For patient/therapist safety PT goals addressed during session: Mobility/safety with mobility;Balance         AM-PAC PT "6 Clicks" Mobility  Outcome Measure Help needed turning from your back to your side while in a flat bed without using bedrails?: Total Help needed moving from lying on your back to sitting on the side of a flat bed without using bedrails?: Total Help needed moving to and from a bed to a chair (including a wheelchair)?: Total Help needed standing up from a chair using your arms (e.g., wheelchair or bedside chair)?: Total Help needed to walk in hospital room?: Total Help needed climbing 3-5 steps with a railing? : Total 6 Click Score: 6    End of Session   Activity Tolerance: Patient tolerated treatment well Patient left: with call bell/phone within reach;with bed alarm set   PT Visit Diagnosis: Unsteadiness on feet (R26.81);Muscle weakness (generalized) (M62.81);Difficulty in walking, not elsewhere classified (R26.2);History of falling (Z91.81);Other symptoms and signs involving the nervous system (R29.898)    Time: [id number] PT Time Calculation (min) (ACUTE ONLY): 18 min   Charges:   PT  Evaluation $PT Eval Moderate Complexity: 1 Mod            Doreatha Massed, PT Acute Rehabilitation  Office: [phone] Pager: [phone]

## 2021-08-20 NOTE — Progress Notes (Signed)
Critical values received by charge RN. K+ 2.5, results paged to hospitalist provider.

## 2021-08-20 NOTE — Progress Notes (Signed)
Patient intermittently refuses PO's. The pt has been encouraged to drink/eat. The pt is refusing at this time. Pt has had minimal intake.

## 2021-08-20 NOTE — Consult Note (Signed)
Palliative Care Consult Note  Reason for consult: Goals of care in light of cognitive impairment, poor intake, and recurrent hypernatremia  Palliative care consult received.  Chart reviewed including personal review of pertinent labs and imaging.  Briefly, Casey Fisher is a 87 year old male with PMHx prostate cancer, parkonsinism, neuromuscular d/o, memory impairment on Aricept, chronic anemia, peripheral neuropathy, chronic diastolic heart failure, BPH and recent admission for severe hyponatremia, hypokalemia, and AKI due to poor oral intake who was discharged to skilled facility and returns with severe hypernatremia, acute encephalopathy, failure to thrive, UTI, and AKI.  He is known to palliative medicine team from prior admission where patient's daughter met with another member of our team.  At that point in time, she requested trial of rehab to see how much functional status he could regain but also expressed understanding that we may be approaching end-of-life.  I met today with Casey Fisher and his daughter.   I introduced palliative care as specialized medical care for people living with serious illness. It focuses on providing relief from the symptoms and stress of a serious illness. The goal is to improve quality of life for both the patient and the family.  Daughter reports understanding and that she recently met with Tacey Ruiz from our team during prior admission.  We discussed clinical course as well as wishes moving forward in regard to advanced directives and care plan this hospitalization.  We discussed difference between a aggressive medical intervention path and a palliative, comfort focused care path. We discussed that the hospital can be useful as long as he is getting well enough from care he receives at the hospital to enjoy his time outside of the hospital, but with his current readmissions secondary to poor intake and hypernatremia, he may be better served to plan on  bringing care to him rather repeated trips to the hospital. We discussed hospice and his daughter is familiar from multiple experiences with prior family members.  His daughter expressed understanding and states that she had previously discussed with Sharyn Lull a plan to see how he does at rehab and if he fails, she understands that this is likely indication that his body is shutting down.  She expressed that there are a lot of family dynamics in play and she is doing her best to make sure that her dad gets the care that he needs but he often disagrees with her thoughts and does not have good insight into his condition.  I attempted to engage Casey Fisher during conversation as well, however, he sat quietly and would not really answer questions.  Outside of the room, his daughter tells me that she thinks that he understands but he is very stubborn at times and also would not talk in front of her as he is upset with her as he wants to be taking care of at home but there is not an option for 24-hour in-home caregivers to be with him.   Recommendations: -DNR/DNI -His daughter thinks that he may be best served by considering transition to residential hospice for end-of-life care (if he qualifies for [employer]).  At the same time, Casey Fisher has expressed his desire to be at home.  His daughter reports that there is not someone who would be with him 24 hours/day and she worries about his care needs being met even with the support of home hospice.  She is certainly open and willing to help, however, she does not live in town (she lives  in [place]) and has her own children for whom she needs to care. -He was unwilling to discuss with me today (his daughter thinks that was because she was present).  She requested I follow-up with him alone tomorrow to discuss further with him. -Plan for follow-up tomorrow.  Start time: 1540 End time: 1700 Total time: 80 minutes  Greater than 50%  of this time was  spent counseling and coordinating care related to the above assessment and plan.'  Micheline Rough, MD [place] Team [phone]

## 2021-08-20 NOTE — Progress Notes (Signed)
PROGRESS NOTE    Casey Fisher  [id number] DOB: September 03, 1928 DOA: 08/18/2021 PCP: [name], MD   Chief Complaint  Patient presents with   Abnormal Labs   Brief Narrative: 87 year old male with history of prostate cancer, parkinsonism, neuromuscular disorder, memory impairment/dementia on Aricept, chronic anemia, peripheral neuropathy chronic diastolic CHF last echo [id number] G2 DD , BPH on Flomax recent hospital admission from 8/5-07/2010 for severe hypernatremia 166, hypokalemia, AKI from poor oral intake and discharged to skilled nursing facility sent from the rehab facility due to abnormal labs poor oral intake.  Patient unable to provide history on admission. In the ED sodium 163 potassium 2.1 lactic acidosis 3.1 appears very dehydrated no evidence of infection on chest x-ray and UA was pending.  UA came back with turbid, leukocyte large nitrate negative WBC more than 50 placed on ceftriaxone,IV fluids and admitted.   Subjective: Seen this morning he is more alert awake able to tell me his name. No other complaints appears very dry   Assessment & Plan:  Severe hypernatremia with volume depletion/dehydration from poor oral intake: Continue IV fluids changed to D5 with potassium sodium slowly improving monitor serial sodium level.   Acute metabolic encephalopathy at baseline with mild dementia but interactive per daughter.  Has been declining past 1 month.  Appears alert awake but poor historian.  Monitor mental status.  PT OT eval once able  Failure to thrive Severe protein calorie malnutrition with poor intake augment diet as tolerated.Speech eval, dysphagia 3 diet, continue aspiration precaution.   UTI: Culture pending.  Continue ceftriaxone for now.    AKI: Prerenal due to poor intake.  Baseline 1.1, creatinine slowly improving with IV fluids.  BUN is improving.   Recent Labs    08/05/21 0245 08/05/21 1622 08/06/21 0410 08/07/21 0358 08/18/21 1822  08/19/21 0200 08/19/21 0712 08/19/21 1256 08/19/21 1647 08/20/21 0245  BUN '12 13 11 10 '$ 53* 46* 37* 41* 44* 37*  CREATININE 1.26* 1.19 1.13 1.11 2.23* 1.86* 1.83* 1.75* 1.76* 1.60*    Hypokalemia severe low slowly coming up unable to take p.o. Will attempt an oral potassium today received 40 M EQ IV this morning we will order additional p.o. IV and repeat BMP this afternoon.  He is on potassium chloride and IV fluid also.   Recent Labs  Lab 08/19/21 0200 08/19/21 0712 08/19/21 1256 08/19/21 1647 08/20/21 0245  K 2.9* <2.0* 2.0* 2.0* 2.8*    Hypophosphatemia resolved  Abnormal KUB worrisome for ileus but no nausea vomiting tolerating p.o.  CT abdomen pelvis was done that showed mild gaseous distention of the colon suggestive of mild colonic ileus no evidence of obstruction, cholelithiasis.  Continue supportive measures. Diet as tolerated.  Constipation-dulcolax rectally,and supportive measures  Prostate cancer  BPH Continue home meds  HLD holding meds for now  Dementia at baseline alert awake and eating a month ago.  Continue Aricept .  Continue fall precaution aspiration precaution delirium precaution and supportive care.  Low albumin level/leg edema: Augment nutrition. Edema from dvt too,  DVT in right lower extremity-ultrasound shows age-indeterminate deep vein thrombosis involving the right femoral vein right popliteal vein right posterior tibial vein and right peroneal vein.  And chronic deep vein thrombosis on the right external iliac vein, common femoral vein SF junction and right femoral vein.  Was placed on heparin drip but had IV access issues and transitioned to renally dosed Lovenox  8/23-monitor per pharmacy   Hypertension blood pressure poorly controlled resume amlodipine Chronic  diastolic CHF: Currently volume depleted/dehydrated.  Goals of care: Currently DNR.  Given his advanced age dementia poor oral intake overall guarded prognosis, has had recurrent  admissions for similar issues palliative care has been consulted.  Appreciate palliative care input daughter thinks he may be best served by considering transition to residential hospice for end-of-life care at the same time patient also expressed his desire to be at home.  Ongoing discussion for goals of care.  Patient is not very interactive although he is able to tell me his name. Overall poor prognosis.  Diet Order             DIET DYS 3 Room service appropriate? Yes; Fluid consistency: Thin  Diet effective now                  Patient's Body mass index is 18.49 kg/m.  DVT prophylaxis:  Code Status:   Code Status: DNR  Family Communication: plan of care discussed with patient's daughter at bedside 8/23.She is the youngest of kids and POA per her and comes form [place].  Status is: Inpatient Remains inpatient appropriate because:IV treatments appropriate due to intensity of illness or inability to take PO and Inpatient level of care appropriate due to severity of illness Dispo: The patient is from: SNF              Anticipated d/c is to:  TBD              Patient currently is not medically stable to d/c.   Difficult to place patient No  Unresulted Labs (From admission, onward)     Start     Ordered   08/20/21 123456  Basic metabolic panel  Once-Timed,   TIMED       Question:  Specimen collection method  Answer:  Lab=Lab collect   08/20/21 1139   08/20/21 0500  CBC  Daily,   R      08/19/21 1326   08/19/21 2000  Sodium  Now then every 6 hours,   R (with TIMED occurrences)     Question:  Specimen collection method  Answer:  Lab=Lab collect   08/19/21 1759          medications reviewed:  Scheduled Meds:  carbidopa-levodopa  1 tablet Oral TID   enoxaparin (LOVENOX) injection  70 mg Subcutaneous Q24H   feeding supplement  237 mL Oral BID BM   multivitamin with minerals  1 tablet Oral Daily   potassium chloride  40 mEq Oral Once   thiamine injection  100 mg Intravenous  Daily   Continuous Infusions:  cefTRIAXone (ROCEPHIN)  IV 1 g (08/20/21 0206)   dextrose 5 % with kcl 75 mL/hr at 08/19/21 2340   potassium chloride 10 mEq (08/20/21 1057)   Consultants:see note  Procedures:see note Antimicrobials: Anti-infectives (From admission, onward)    Start     Dose/Rate Route Frequency Ordered Stop   08/19/21 0215  cefTRIAXone (ROCEPHIN) 1 g in sodium chloride 0.9 % 100 mL IVPB        1 g 200 mL/hr over 30 Minutes Intravenous Every 24 hours 08/19/21 0206        Culture/Microbiology    Component Value Date/Time   SDES  08/19/2021 0208    URINE, CLEAN CATCH Performed at [facility] [place] [address] [place], [place] [address]    SPECREQUEST  08/19/2021 [id number]    NONE Performed at [facility], [place] [address], [place], [place] [address]  CULT (A) 08/19/2021 0208    >=100,000 COLONIES/mL AEROCOCCUS SPECIES Standardized susceptibility testing for this organism is not available. Performed at [facility], [place] [address], [place], [place] [address]    REPTSTATUS 08/20/2021 FINAL 08/19/2021 0208    Other culture-see note  Objective: Vitals: Today's Vitals   08/19/21 2000 08/19/21 2038 08/20/21 0043 08/20/21 0427  BP:  (!) 168/91 (!) 170/92 (!) 153/85  Pulse:  (!) 53 69 79  Resp:  '16 16 14  '$ Temp:  97.8 F (36.6 C) 98.4 F (36.9 C)   TempSrc:  Oral Axillary   SpO2:      Weight:      Height:      PainSc: 0-No pain       Intake/Output Summary (Last 24 hours) at 08/20/2021 1139 Last data filed at 08/20/2021 0507 Gross per 24 hour  Intake 2101.09 ml  Output 250 ml  Net 1851.09 ml    Filed Weights   08/19/21 0908  Weight: 67.1 kg   Weight change:   Intake/Output from previous day: 08/23 0701 - 08/24 0700 In: 2101.1 [I.V.:1000; IV Piggyback:1101.1] Out: 250 [Urine:250] Intake/Output this shift: No intake/output data recorded. Filed Weights   08/19/21 0908  Weight: 67.1 kg    Examination: General exam: AAO, able to tell me his name, not in distress.  Appears dry.   HEENT:Oral mucosa dry skin turgor decreased,Ear/Nose WNL grossly, dentition normal. Respiratory system: bilaterally diminished, no use of accessory muscle Cardiovascular system: S1 & S2 +, No JVD,. Gastrointestinal system: Abdomen soft,NT,ND, BS+ Nervous System:Alert, awake, moving all extremities . Extremities: no edema, distal peripheral pulses palpable.  Skin: No rashes,no icterus. MSK: Normal muscle bulk,tone, power   Data Reviewed: I have personally reviewed following labs and imaging studies.  CBC: Recent Labs  Lab 08/18/21 1822 08/19/21 0712 08/20/21 0245  WBC 11.2* 10.4 10.0  NEUTROABS 9.7* 9.0*  --   HGB 13.8 12.3* 12.2*  HCT 45.2 41.4 39.4  MCV 92.8 95.2 91.8  PLT 141* 116* 119*    Basic Metabolic Panel: Recent Labs  Lab 08/19/21 0200 08/19/21 0712 08/19/21 1256 08/19/21 1647 08/19/21 2021 08/20/21 0245 08/20/21 0753  NA 160* 160* 165* 163* 162* 162* 160*  K 2.9* <2.0* 2.0* 2.0*  --  2.8*  --   CL 130* 129* >130* 129*  --  >130*  --   CO2 '23 23 24 26  '$ --  22  --   GLUCOSE 148* 136* 145* 124*  --  148*  --   BUN 46* 37* 41* 44*  --  37*  --   CREATININE 1.86* 1.83* 1.75* 1.76*  --  1.60*  --   CALCIUM 8.2* 8.5* 8.5* 8.2*  --  8.2*  --   MG 2.4 2.2 2.4  --   --  2.1  --   PHOS 1.9* 2.2*  --   --   --  3.2  --     GFR: Estimated Creatinine Clearance: 28.5 mL/min (A) (by C-G formula based on SCr of 1.6 mg/dL (H)). Liver Function Tests: Recent Labs  Lab 08/18/21 1822 08/19/21 0712  AST 16 11*  ALT 13 12  ALKPHOS 78 71  BILITOT 0.6 0.6  PROT 7.9 7.0  ALBUMIN 3.2* 2.9*    No results for input(s): LIPASE, AMYLASE in the last 168 hours. No results for input(s): AMMONIA in the last 168 hours. Coagulation Profile: Recent Labs  Lab 08/18/21 1822  INR 1.2    Cardiac Enzymes: Recent Labs  Lab 08/19/21 0200  CKTOTAL 91    BNP (last 3 results) No  results for input(s): PROBNP in the last 8760 hours. HbA1C: No results for input(s): HGBA1C in the last 72 hours. CBG: No results for input(s): GLUCAP in the last 168 hours. Lipid Profile: No results for input(s): CHOL, HDL, LDLCALC, TRIG, CHOLHDL, LDLDIRECT in the last 72 hours. Thyroid Function Tests: Recent Labs    08/19/21 1256  TSH 1.823   Anemia Panel: No results for input(s): VITAMINB12, FOLATE, FERRITIN, TIBC, IRON, RETICCTPCT in the last 72 hours. Sepsis Labs: Recent Labs  Lab 08/18/21 1822 08/18/21 2210  LATICACIDVEN 3.4* 2.0*     Recent Results (from the past 240 hour(s))  Resp Panel by RT-PCR (Flu A&B, Covid) Nasopharyngeal Swab     Status: None   Collection Time: 08/18/21  6:20 PM   Specimen: Nasopharyngeal Swab; Nasopharyngeal(NP) swabs in vial transport medium  Result Value Ref Range Status   SARS Coronavirus 2 by RT PCR NEGATIVE NEGATIVE Final    Comment: (NOTE) SARS-CoV-2 target nucleic acids are NOT DETECTED.  The SARS-CoV-2 RNA is generally detectable in upper respiratory specimens during the acute phase of infection. The lowest concentration of SARS-CoV-2 viral copies this assay can detect is 138 copies/mL. A negative result does not preclude SARS-Cov-2 infection and should not be used as the sole basis for treatment or other patient management decisions. A negative result may occur with  improper specimen collection/handling, submission of specimen other than nasopharyngeal swab, presence of viral mutation(s) within the areas targeted by this assay, and inadequate number of viral copies(<138 copies/mL). A negative result must be combined with clinical observations, patient history, and epidemiological information. The expected result is Negative.  Fact Sheet for Patients:  EntrepreneurPulse.com.au  Fact Sheet for Healthcare Providers:  IncredibleEmployment.be  This test is no t yet approved or cleared by the  [place] FDA and  has been authorized for detection and/or diagnosis of SARS-CoV-2 by FDA under an Emergency Use Authorization (EUA). This EUA will remain  in effect (meaning this test can be used) for the duration of the COVID-19 declaration under Section 564(b)(1) of the Act, 21 U.S.C.section 360bbb-3(b)(1), unless the authorization is terminated  or revoked sooner.       Influenza A by PCR NEGATIVE NEGATIVE Final   Influenza B by PCR NEGATIVE NEGATIVE Final    Comment: (NOTE) The Xpert Xpress SARS-CoV-2/FLU/RSV plus assay is intended as an aid in the diagnosis of influenza from Nasopharyngeal swab specimens and should not be used as a sole basis for treatment. Nasal washings and aspirates are unacceptable for Xpert Xpress SARS-CoV-2/FLU/RSV testing.  Fact Sheet for Patients: EntrepreneurPulse.com.au  Fact Sheet for Healthcare Providers: IncredibleEmployment.be  This test is not yet approved or cleared by the [place] FDA and has been authorized for detection and/or diagnosis of SARS-CoV-2 by FDA under an Emergency Use Authorization (EUA). This EUA will remain in effect (meaning this test can be used) for the duration of the COVID-19 declaration under Section 564(b)(1) of the Act, 21 U.S.C. section 360bbb-3(b)(1), unless the authorization is terminated or revoked.  Performed at [facility], [place] [address] [place], [place] [address]   Urine Culture     Status: Abnormal   Collection Time: 08/19/21  2:08 AM   Specimen: Urine, Clean Catch  Result Value Ref Range Status   Specimen Description   Final    URINE, CLEAN CATCH Performed at [facility], [place] [address] [place], [place]  [id number]    Special Requests   Final    NONE Performed at [facility], [place] [address], [place], [place] [address]    Culture (A)  Final    >=100,000 COLONIES/mL AEROCOCCUS  SPECIES Standardized susceptibility testing for this organism is not available. Performed at [facility], [place] [address] [place],  [address]    Report Status 08/20/2021 FINAL  Final      Radiology Studies: CT ABDOMEN PELVIS WO CONTRAST  Result Date: 08/19/2021 CLINICAL DATA:  Bowel obstruction, anorexia, abdominal distension EXAM: CT ABDOMEN AND PELVIS WITHOUT CONTRAST TECHNIQUE: Multidetector CT imaging of the abdomen and pelvis was performed following the standard protocol without IV contrast. COMPARISON:  10/29/2016 FINDINGS: Lower chest: Visualized lung bases are clear bilaterally. Mild coronary artery calcification. Global cardiac size within normal limits. Hepatobiliary: Cholelithiasis without pericholecystic inflammatory change. Stable subserosal cyst noted within the left hepatic lobe. Liver is otherwise unremarkable. No intra or extrahepatic biliary ductal dilation. Pancreas: Unremarkable Spleen: Unremarkable Adrenals/Urinary Tract: The adrenal glands are unremarkable. The kidneys are mildly atrophic, but are otherwise unremarkable. The bladder is distended but is otherwise unremarkable. Stomach/Bowel: There is mild gaseous distension of the a colon most in keeping with a mild colonic ileus. No evidence of obstruction. Fluid within the rectal vault may present clinically as diarrhea. High density material within the rectal vault may relate to prior contrast study or enema. The stomach, small bowel, and large bowel are otherwise unremarkable. Appendix normal. No free intraperitoneal gas or fluid. Vascular/Lymphatic: Moderate aortoiliac atherosclerotic calcification. No aortic aneurysm. No pathologic adenopathy within the abdomen and pelvis. Reproductive: Brachytherapy seeds seen within the prostate gland. Other: Tiny fat containing umbilical hernia. Musculoskeletal: Degenerative changes are seen within the lumbar spine. Moderate lumbar dextroscoliosis noted. No acute bone  abnormality. IMPRESSION: Mild gaseous distension of the colon suggesting a mild colonic ileus. No evidence of obstruction. Fluid within the rectal vault possibly presenting clinically as diarrhea. Cholelithiasis. Mild bladder distension possibly related to voluntary retention or bladder outlet obstruction. Aortic Atherosclerosis (ICD10-I70.0). Electronically Signed   By: Fidela Salisbury M.D.   On: 08/19/2021 03:16   DG Abd 1 View  Result Date: 08/18/2021 CLINICAL DATA:  Abdominal distension. EXAM: ABDOMEN - 1 VIEW COMPARISON:  None. FINDINGS: Diffuse gaseous distension of the colon may represent ileus. Clinical correlation is recommended. No definite small bowel dilatation. Evaluation however is limited on the provided images. No free air identified. There is degenerative changes of the spine. No acute osseous pathology. IMPRESSION: Diffuse gaseous distension of the colon may represent adynamic ileus. Clinical correlation is recommended. Electronically Signed   By: Anner Crete M.D.   On: 08/18/2021 21:27   DG Chest Port 1 View  Result Date: 08/18/2021 CLINICAL DATA:  Dyspnea. EXAM: PORTABLE CHEST 1 VIEW COMPARISON:  08/01/2021 FINDINGS: Stable cardiomediastinal contours. Unchanged asymmetric elevation of the left hemidiaphragm. Atelectasis is noted in the left base. Asymmetric increase interstitial markings are noted within the left lung. Right lung appears clear. IMPRESSION: 1. Asymmetric increase interstitial markings in the left lung may represent asymmetric edema or atypical infection. 2. Stable asymmetric elevation of left hemidiaphragm. Electronically Signed   By: Kerby Moors M.D.   On: 08/18/2021 18:19   VAS [place] LOWER EXTREMITY VENOUS (DVT)  Result Date: 08/19/2021  Lower Venous DVT Study Patient Name:  Casey Fisher  Date of Exam:   08/19/2021 Medical Rec #: [id number]           Accession #:  [id number] Date of Birth: 05-09-1928            Patient Gender: M Patient Age:   44 years Exam  Location:  [facility] Procedure:      VAS [place] LOWER EXTREMITY VENOUS (DVT) Referring Phys: Nyoka Lint DOUTOVA --------------------------------------------------------------------------------  Indications: Edema.  Limitations: VERY difficult exam due to patient's inability to cooperate due to mental status (dementia). Patient lying on left side with legs bent - could not reposition. Comparison Study: No previous exams Performing Technologist: Jody Hill RVT, RDMS  Examination Guidelines: A complete evaluation includes B-mode imaging, spectral Doppler, color Doppler, and power Doppler as needed of all accessible portions of each vessel. Bilateral testing is considered an integral part of a complete examination. Limited examinations for reoccurring indications may be performed as noted. The reflux portion of the exam is performed with the patient in reverse Trendelenburg.  +---------+---------------+---------+-----------+----------+-------------------+ RIGHT    CompressibilityPhasicitySpontaneityPropertiesThrombus Aging      +---------+---------------+---------+-----------+----------+-------------------+ CFV      None           No       No                   Chronic             +---------+---------------+---------+-----------+----------+-------------------+ SFJ      None                                         Chronic             +---------+---------------+---------+-----------+----------+-------------------+ FV Prox  None           No       No                   Chronic             +---------+---------------+---------+-----------+----------+-------------------+ FV Mid   None           No       No                   Age Indeterminate   +---------+---------------+---------+-----------+----------+-------------------+ FV DistalNone           No       No                   Age Indeterminate   +---------+---------------+---------+-----------+----------+-------------------+ PFV       Full           Yes      Yes                                      +---------+---------------+---------+-----------+----------+-------------------+ POP      None           No       No                   Age Indeterminate   +---------+---------------+---------+-----------+----------+-------------------+ PTV      None           No       No                   Age Indeterminate   +---------+---------------+---------+-----------+----------+-------------------+ PERO     None  No       No                   Age Indeterminate   +---------+---------------+---------+-----------+----------+-------------------+ EIV      Partial        Yes      Yes                  Chronic- Distal EIV                                                       (Prox & Mid are                                                           patent)             +---------+---------------+---------+-----------+----------+-------------------+ limited visualization of PTV and PeroV due to patient positioning.  +---------+---------------+---------+-----------+----------+--------------+ LEFT     CompressibilityPhasicitySpontaneityPropertiesThrombus Aging +---------+---------------+---------+-----------+----------+--------------+ CFV      Full           Yes      Yes                                 +---------+---------------+---------+-----------+----------+--------------+ SFJ      Full                                                        +---------+---------------+---------+-----------+----------+--------------+ FV Prox  Full           Yes      Yes                                 +---------+---------------+---------+-----------+----------+--------------+ FV Mid   Full           Yes      Yes                                 +---------+---------------+---------+-----------+----------+--------------+ FV DistalFull           Yes      Yes                                  +---------+---------------+---------+-----------+----------+--------------+ PFV      Full                                                        +---------+---------------+---------+-----------+----------+--------------+ POP      Full           Yes      Yes                                 +---------+---------------+---------+-----------+----------+--------------+  PTV      Full                                                        +---------+---------------+---------+-----------+----------+--------------+ PERO                                                  Not visualized +---------+---------------+---------+-----------+----------+--------------+   Left Technical Findings: Not visualized segments include peroneals.   Summary: BILATERAL: - No evidence of superficial venous thrombosis in the lower extremities, bilaterally. -No evidence of popliteal cyst, bilaterally. RIGHT: - Findings consistent with age indeterminate deep vein thrombosis involving the right femoral vein (mid & distal), right popliteal vein, right posterior tibial veins, and right peroneal veins. - Findings consistent with chronic deep vein thrombosis involving the right external iliac vein (distal), common femoral vein SF junction, and right femoral vein (prox). - Portions of this examination were limited- see technologist comments above.  LEFT: - There is no evidence of deep vein thrombosis in the lower extremity. However, portions of this examination were limited- see technologist comments above.  *See table(s) above for measurements and observations. Electronically signed by Deitra Mayo MD on 08/19/2021 at 2:53:05 PM.    Final      LOS: 2 days   Antonieta Pert, MD [facility]  08/20/2021, 11:39 AM

## 2021-08-20 NOTE — Evaluation (Signed)
Occupational Therapy Evaluation Patient Details Name: Casey Fisher MRN: [id number] DOB: 04-27-1928 Today's Date: 08/20/2021    History of Present Illness 87 yo male admitted from ALF with hypernatremia, hypokalemia, R LE DVT. Hx of dementia, MD d/o, Parkinson's, prostate CA, CHF   Clinical Impression   Pt. Was inconsistant with following commands or answering questions. Pt. Was Total assist for all ADLs and mobility tasks. Unsure what level of care family cane provide. At this point pt. Will need snf for rehab. Acute ot to follow.     Follow Up Recommendations  SNF    Equipment Recommendations       Recommendations for Other Services       Precautions / Restrictions Precautions Precautions: Fall Precaution Comments: Bladder/bowel incontinence; bilateral soft mitts Restrictions Weight Bearing Restrictions: No      Mobility Bed Mobility Overal bed mobility: Needs Assistance Bed Mobility: Rolling;Supine to Sit;Sit to Supine Rolling: Total assist;+2 for physical assistance   Supine to sit: +2 for physical assistance;HOB elevated;Total assist Sit to supine: Total assist;+2 for physical assistance;+2 for safety/equipment;HOB elevated   General bed mobility comments: Assist for trunk and bil LEs. Utilized bedpad to help with positioning    Transfers Overall transfer level: Needs assistance Equipment used: 2 person hand held assist Transfers: Sit to/from Stand Sit to Stand: Total assist;+2 physical assistance;+2 safety/equipment;From elevated surface         General transfer comment: Attempted-pt just barely able to clear bottom from bed surface-unable to successfully get into standing    Balance Overall balance assessment: Needs assistance Sitting-balance support: Bilateral upper extremity supported;Feet supported   Sitting balance - Comments: Pt tends to maintain flexed forward posture with leaning to R side. able to correct with multimodal cueing but he doesn't  maintain                                   ADL either performed or assessed with clinical judgement   ADL Overall ADL's : Needs assistance/impaired Eating/Feeding: Total assistance;Bed level   Grooming: Wash/dry hands;Wash/dry face;Total assistance;Bed level   Upper Body Bathing: Total assistance;Bed level   Lower Body Bathing: Total assistance;Bed level   Upper Body Dressing : Total assistance;Bed level   Lower Body Dressing: Total assistance;Bed level               Functional mobility during ADLs:  (unable to stand with 2 person assist.) General ADL Comments: pt. not consistantly able to follow 1 step commands.     Vision Baseline Vision/History:  (unknown) Ability to See in Adequate Light:  (unable to assess)       Perception     Praxis      Pertinent Vitals/Pain Pain Assessment: No/denies pain Faces Pain Scale: No hurt     Hand Dominance Right   Extremity/Trunk Assessment Upper Extremity Assessment Upper Extremity Assessment: LUE deficits/detail LUE Deficits / Details: would not move shld into flex. aarom was 90.   Lower Extremity Assessment Lower Extremity Assessment: Defer to PT evaluation   Cervical / Trunk Assessment Cervical / Trunk Assessment: Kyphotic   Communication Communication Communication: HOH   Cognition Arousal/Alertness: Awake/alert Behavior During Therapy: Flat affect Overall Cognitive Status: History of cognitive impairments - at baseline                                 General Comments:  Pt with inconsistent following of simple commands; minimal verbal responses to answers   General Comments       Exercises     Shoulder Instructions      Home Living Family/patient expects to be discharged to:: Private residence Living Arrangements: Other relatives Available Help at Discharge: Family;Available 24 hours/day Type of Home: House Home Access: Stairs to enter [employer] of Steps: 2          Bathroom Shower/Tub: Tub/shower unit         Home Equipment: Environmental consultant - 2 wheels;Walker - standard;Bedside commode;Wheelchair - manual   Additional Comments: Pt. is not able to inform about bathroom setup.      Prior Functioning/Environment Level of Independence: Needs assistance  Gait / Transfers Assistance Needed: Has been having more difficulty walking lately, used to walk household distances with a shuffling gait pattern using a RW with assistance. Needs assistance for bed mobility and transfers, +1. ADL's / Homemaking Assistance Needed: Needs assistance for all ADLs.   Comments: per chart review, was ambulatory in 03/2021        OT Problem List: Decreased strength;Decreased range of motion;Decreased activity tolerance;Impaired balance (sitting and/or standing);Decreased cognition;Decreased safety awareness;Decreased knowledge of use of DME or AE      OT Treatment/Interventions: Self-care/ADL training;Therapeutic exercise;DME and/or AE instruction;Therapeutic activities;Patient/family education;Balance training    OT Goals(Current goals can be found in the care plan section) Acute Rehab OT Goals Patient Stated Goal: pt unable to state OT Goal Formulation: Patient unable to participate in goal setting Time For Goal Achievement: 09/03/21 Potential to Achieve Goals: Fair  OT Frequency: Min 2X/week   Barriers to D/C:            Co-evaluation PT/OT/SLP Co-Evaluation/Treatment: Yes Reason for Co-Treatment: For patient/therapist safety;To address functional/ADL transfers PT goals addressed during session: Mobility/safety with mobility;Balance OT goals addressed during session: ADL's and self-care      AM-PAC OT "6 Clicks" Daily Activity     Outcome Measure Help from another person eating meals?: Total Help from another person taking care of personal grooming?: Total Help from another person toileting, which includes using toliet, bedpan, or urinal?: Total Help  from another person bathing (including washing, rinsing, drying)?: Total Help from another person to put on and taking off regular upper body clothing?: Total Help from another person to put on and taking off regular lower body clothing?: Total 6 Click Score: 6   End of Session Equipment Utilized During Treatment: Gait belt Nurse Communication:  (ok therapy)  Activity Tolerance: Patient limited by lethargy Patient left: in bed;with call bell/phone within reach;with bed alarm set  OT Visit Diagnosis: Unsteadiness on feet (R26.81);Other abnormalities of gait and mobility (R26.89);Muscle weakness (generalized) (M62.81)                Time: [id number] OT Time Calculation (min): 17 min Charges:  OT General Charges $OT Visit: 1 Visit OT Evaluation $OT Eval Moderate Complexity: 1 Mod  Reece Packer OT/L   Everlina Gotts 08/20/2021, 4:52 PM

## 2021-08-21 DIAGNOSIS — E870 Hyperosmolality and hypernatremia: Secondary | ICD-10-CM | POA: Diagnosis not present

## 2021-08-21 DIAGNOSIS — N179 Acute kidney failure, unspecified: Secondary | ICD-10-CM | POA: Diagnosis not present

## 2021-08-21 DIAGNOSIS — N390 Urinary tract infection, site not specified: Secondary | ICD-10-CM | POA: Diagnosis not present

## 2021-08-21 DIAGNOSIS — G20 Parkinson's disease: Secondary | ICD-10-CM | POA: Diagnosis not present

## 2021-08-21 DIAGNOSIS — E860 Dehydration: Secondary | ICD-10-CM | POA: Diagnosis not present

## 2021-08-21 LAB — CBC
HCT: 38.8 % — ABNORMAL LOW (ref 39.0–52.0)
Hemoglobin: 11.8 g/dL — ABNORMAL LOW (ref 13.0–17.0)
MCH: 28.4 pg (ref 26.0–34.0)
MCHC: 30.4 g/dL (ref 30.0–36.0)
MCV: 93.3 fL (ref 80.0–100.0)
Platelets: 128 10*3/uL — ABNORMAL LOW (ref 150–400)
RBC: 4.16 MIL/uL — ABNORMAL LOW (ref 4.22–5.81)
RDW: 17.2 % — ABNORMAL HIGH (ref 11.5–15.5)
WBC: 8.7 10*3/uL (ref 4.0–10.5)
nRBC: 0.2 % (ref 0.0–0.2)

## 2021-08-21 LAB — BASIC METABOLIC PANEL
Anion gap: 8 (ref 5–15)
BUN: 41 mg/dL — ABNORMAL HIGH (ref 8–23)
BUN: 27 mg/dL — ABNORMAL HIGH (ref 8–23)
CO2: 24 mmol/L (ref 22–32)
CO2: 22 mmol/L (ref 22–32)
Calcium: 8.5 mg/dL — ABNORMAL LOW (ref 8.9–10.3)
Calcium: 8.1 mg/dL — ABNORMAL LOW (ref 8.9–10.3)
Chloride: >130 mmol/L (ref 98–111)
Chloride: 126 mmol/L — ABNORMAL HIGH (ref 98–111)
Creatinine, Ser: 1.75 mg/dL — ABNORMAL HIGH (ref 0.61–1.24)
Creatinine, Ser: 1.39 mg/dL — ABNORMAL HIGH (ref 0.61–1.24)
GFR, Estimated: 36 mL/min — ABNORMAL LOW (ref >60)
GFR, Estimated: 48 mL/min — ABNORMAL LOW (ref >60)
Glucose, Bld: 145 mg/dL — ABNORMAL HIGH (ref 70–99)
Glucose, Bld: 154 mg/dL — ABNORMAL HIGH (ref 70–99)
Potassium: 2 mmol/L — CL (ref 3.5–5.1)
Potassium: 2.9 mmol/L — ABNORMAL LOW (ref 3.5–5.1)
Sodium: 165 mmol/L (ref 135–145)
Sodium: 156 mmol/L — ABNORMAL HIGH (ref 135–145)

## 2021-08-21 LAB — SODIUM
Sodium: 153 mmol/L — ABNORMAL HIGH (ref 135–145)
Sodium: 151 mmol/L — ABNORMAL HIGH (ref 135–145)
Sodium: 150 mmol/L — ABNORMAL HIGH (ref 135–145)

## 2021-08-21 LAB — GLUCOSE, CAPILLARY: Glucose-Capillary: 145 mg/dL — ABNORMAL HIGH (ref 70–99)

## 2021-08-21 MED ORDER — POTASSIUM CHLORIDE 10 MEQ/100ML IV SOLN
10.0000 meq | INTRAVENOUS | Status: AC
Start: 2021-08-21 — End: 2021-08-21
  Administered 2021-08-21 (×4): 10 meq via INTRAVENOUS
  Filled 2021-08-21 (×4): qty 100

## 2021-08-21 MED ORDER — PENICILLIN G POTASSIUM 5000000 UNITS IJ SOLR
6.0000 10*6.[IU] | Freq: Two times a day (BID) | INTRAVENOUS | Status: DC
  Administered 2021-08-21 (×2): 6 10*6.[IU] via INTRAVENOUS
  Filled 2021-08-21 (×3): qty 6

## 2021-08-21 MED ORDER — POTASSIUM CHLORIDE 10 MEQ/100ML IV SOLN
10.0000 meq | INTRAVENOUS | Status: AC
  Administered 2021-08-21 (×2): 10 meq via INTRAVENOUS
  Filled 2021-08-21 (×2): qty 100

## 2021-08-21 MED ORDER — AMOXICILLIN 250 MG PO CAPS: 500.0000 mg | ORAL_CAPSULE | Freq: Two times a day (BID) | ORAL | Status: DC

## 2021-08-21 MED ORDER — POTASSIUM CHLORIDE CRYS ER 20 MEQ PO TBCR: 40.0000 meq | EXTENDED_RELEASE_TABLET | Freq: Once | ORAL | Status: DC

## 2021-08-21 MED ORDER — POTASSIUM CL IN DEXTROSE 5% 20 MEQ/L IV SOLN
20.0000 meq | INTRAVENOUS | Status: DC
  Administered 2021-08-21 – 2021-08-22 (×3): 20 meq via INTRAVENOUS
  Filled 2021-08-21 (×3): qty 1000

## 2021-08-21 NOTE — Progress Notes (Signed)
Pt has a potassium of 2.9. MD aware, 4 runs of K ordered. First bag currently running.

## 2021-08-21 NOTE — Evaluation (Signed)
SLP Cancellation Note  Patient Details Name: Casey Fisher MRN: [id number] DOB: 08-27-1928   Cancelled treatment:       Reason Eval/Treat Not Completed: Other (comment) (pt now comfort care, will sign off, please reorder if desired. thanks.)   Macario Golds 08/21/2021, 6:50 PM   Kathleen Lime, MS [facility] SLP Acute Rehab Services Office [phone] Pager [phone]

## 2021-08-21 NOTE — Progress Notes (Addendum)
Manufacturing engineer [facility]) Hospital Liaison note.    Pt has been deemed eligible for [facility].  This liaison visited at bedside and spoke with grandson at bedside and daughter [name] by phone.  Liaison will follow up tomorrow should bed be available.  Received request from [facility] for family interest in [facility]. Chart and pt information under review by [name] physician.  Hospice eligibility pending at this time.  [facility] is unable to offer a room today. Hospital Liaison will follow up tomorrow or sooner if a room becomes available. Please do not hesitate to call with questions.    Thank you for the opportunity to participate in this patient's care.  Domenic Moras, BSN, RN [facility] Liaison (listed on [facility] under Hospice[facility])    [phone] (24h on call)

## 2021-08-21 NOTE — Progress Notes (Signed)
PROGRESS NOTE    Casey Fisher  [id number] DOB: [birth year] DOA: 08/18/2021 PCP: [name], MD   Chief Complaint  Patient presents with   Abnormal Labs   Brief Narrative: 87 year old male with history of prostate cancer, parkinsonism, neuromuscular disorder, memory impairment/dementia on Aricept, chronic anemia, peripheral neuropathy chronic diastolic CHF last echo [id number] G2 DD , BPH on Flomax recent hospital admission from 8/5-07/2010 for severe hypernatremia 166, hypokalemia, AKI from poor oral intake and discharged to skilled nursing facility sent from the rehab facility due to abnormal labs poor oral intake.  Patient unable to provide history on admission. In the ED sodium 163 potassium 2.1 lactic acidosis 3.1 appears very dehydrated no evidence of infection on chest x-ray and UA was pending.  UA came back with turbid, leukocyte large nitrate negative WBC more than 50 placed on ceftriaxone,IV fluids and admitted.   Subjective: Seen and examined this morning. Eyes are open alert awake but no meaningful conversation does not interact needing mitten in place. Overnight low potassium replacement started Sodium improving.  Assessment & Plan:  Severe hypernatremia with volume depletion/dehydration from poor oral intake: Recurrent issue due to his poor oral intake.  Discussed with nephrology Dr Jonnie Finner 8/24 and D5 water increased and sodium improving.  Continue to monitor serial sodium level.  Hopefully we can wean down the fluid rate today Recent Labs  Lab 08/20/21 0753 08/20/21 1333 08/20/21 2125 08/21/21 0156 08/21/21 0753  NA 160* 165* 159* 156* 153*    Acute metabolic encephalopathy at baseline with mild dementia but interactive per daughter.  Has been declining past 1 month.  He is alert awake oriented to self.  Not interactive.    Failure to thrive Severe protein calorie malnutrition with poor intake augment diet as tolerated.Speech eval, dysphagia 3  diet-patient has been despite multiple attempts refusing p.o. meds as well.   UTI: Urine culture shows Aerococcus species more than 100,000-sensitivity not routinely performed, on ceftriaxone will discuss with pharmacy to change therapy ot penicilling.  AKI: Prerenal due to poor intake.  Baseline 1.1, creatinine improving with aggressive IV fluid hydration.  Again will have recurrent issues if he continues to not eat and having failure to thrive.   Recent Labs    08/07/21 0358 08/18/21 1822 08/19/21 0200 08/19/21 0712 08/19/21 1256 08/19/21 1647 08/20/21 0245 08/20/21 1333 08/20/21 2125 08/21/21 0156  BUN 10 53* 46* 37* 41* 44* 37* 33* 31* 27*  CREATININE 1.11 2.23* 1.86* 1.83* 1.75* 1.76* 1.60* 1.43* 1.46* 1.39*   Hypokalemia severe low -due to poor oral intake, refused oral replacement this morning , placed on 40 M EQ potassium IV also getting potassium in IV fluids. Recent Labs  Lab 08/19/21 1647 08/20/21 0245 08/20/21 1333 08/20/21 2125 08/21/21 0156  K 2.0* 2.8* 2.5* 3.9 2.9*   Hypophosphatemia resolved  Abnormal KUB worrisome for ileus but no nausea vomiting tolerating p.o.  CT abdomen pelvis was done that showed mild gaseous distention of the colon suggestive of mild colonic ileus no evidence of obstruction, cholelithiasis.  Continue supportive measures. Diet as tolerated.  Constipation-dulcolax rectally,and supportive measures  Prostate cancer  BPH Continue home meds-but n.p.o.  HLD holding meds for now  Dementia at baseline alert awake and eating a month ago.  Continue Aricept if able to take.  Continue fall precaution aspiration precaution delirium precaution and supportive care.  Low albumin level/leg edema: Augment nutrition. Edema from dvt too,  DVT in right lower extremity-ultrasound shows age-indeterminate deep vein thrombosis involving the  right femoral vein right popliteal vein right posterior tibial vein and right peroneal vein and chronic deep vein  thrombosis on the right external iliac vein, common femoral vein SF junction and right femoral vein.  Was placed on heparin drip but had IV access issues and transitioned to renally dosed Lovenox  8/23-pharmacy monitoring.  Hypertension blood pressure poorly controlled -resumed amlodipine but not taking.  Blood pressure appears stable this morning.  Chronic diastolic CHF: Currently volume depleted/dehydrated.  Goals of care: Currently DNR.  Given his advanced age dementia poor oral intake overall guarded prognosis, has had recurrent admissions for similar issues .palliative care has been consulted.  Input appreciated .  Diet Order             DIET DYS 3 Room service appropriate? Yes; Fluid consistency: Thin  Diet effective now                  Patient's Body mass index is 18.49 kg/m.  DVT prophylaxis:  Code Status:   Code Status: DNR  Family Communication: plan of care discussed with patient's daughter at bedside 8/23.She is the youngest of kids and POA per her and comes form [place].  Status is: Inpatient Remains inpatient appropriate because:IV treatments appropriate due to intensity of illness or inability to take PO and Inpatient level of care appropriate due to severity of illness Dispo: The patient is from: SNF              Anticipated d/c is to:  TBD              Patient currently is not medically stable to d/c.   Difficult to place patient No  Unresulted Labs (From admission, onward)     Start     Ordered   08/21/21 [id number]  Basic metabolic panel  Daily,   R     Question:  Specimen collection method  Answer:  Lab=Lab collect   08/20/21 1201   08/20/21 0500  CBC  Daily,   R      08/19/21 1326   08/19/21 2000  Sodium  Now then every 6 hours,   R (with TIMED occurrences)     Question:  Specimen collection method  Answer:  Lab=Lab collect   08/19/21 1759          medications reviewed:  Scheduled Meds:  amLODipine  10 mg Oral Daily   amoxicillin  500 mg Oral Q12H    carbidopa-levodopa  1 tablet Oral TID   donepezil  5 mg Oral QHS   DULoxetine  30 mg Oral Daily   enoxaparin (LOVENOX) injection  70 mg Subcutaneous Q24H   feeding supplement  237 mL Oral BID BM   multivitamin with minerals  1 tablet Oral Daily   thiamine injection  100 mg Intravenous Daily   Continuous Infusions:  dextrose 5 % with KCl 20 mEq / L 20 mEq (08/21/21 1103)   potassium chloride 10 mEq (08/21/21 1102)   potassium chloride     Consultants:see note  Procedures:see note Antimicrobials: Anti-infectives (From admission, onward)    Start     Dose/Rate Route Frequency Ordered Stop   08/21/21 1215  amoxicillin (AMOXIL) capsule 500 mg        500 mg Oral Every 12 hours 08/21/21 1128 08/26/21 0959   08/19/21 0215  cefTRIAXone (ROCEPHIN) 1 g in sodium chloride 0.9 % 100 mL IVPB  Status:  Discontinued        1 g 200 mL/hr over 30  Minutes Intravenous Every 24 hours 08/19/21 0206 08/21/21 1128      Culture/Microbiology    Component Value Date/Time   SDES  08/19/2021 0208    URINE, CLEAN CATCH Performed at [facility], [place] [address] [place], [place] [address]    SPECREQUEST  08/19/2021 [id number]    NONE Performed at [facility], [place] [address] [place], [place] [address]    CULT (A) 08/19/2021 0208    >=100,000 COLONIES/mL AEROCOCCUS SPECIES Standardized susceptibility testing for this organism is not available. Performed at [facility], [place] [address] [place], [place] [address]    REPTSTATUS 08/20/2021 FINAL 08/19/2021 0208    Other culture-see note  Objective: Vitals: Today's Vitals   08/20/21 2103 08/20/21 2111 08/21/21 0408 08/21/21 0800  BP: 128/89 127/81 140/90   Pulse: (!) 45 81 71   Resp: '19 18 18   '$ Temp: 97.8 F (36.6 C) 97.7 F (36.5 C) (!) 97.5 F (36.4 C)   TempSrc: Oral Oral Oral   SpO2: 94% 97% 96%   Weight:      Height:      PainSc:    0-No pain    Intake/Output Summary (Last 24 hours) at  08/21/2021 1146 Last data filed at 08/21/2021 0413 Gross per 24 hour  Intake 1866.03 ml  Output 100 ml  Net 1766.03 ml   Filed Weights   08/19/21 0908  Weight: 67.1 kg   Weight change:   Intake/Output from previous day: 08/24 0701 - 08/25 0700 In: 1906 [P.O.:40; I.V.:1215.3; IV Piggyback:650.7] Out: 100 [Urine:100] Intake/Output this shift: No intake/output data recorded. Filed Weights   08/19/21 0908  Weight: 67.1 kg   Examination: General exam: Aa0x0, elderly frail thin looking , older than stated age, weak appearing. HEENT:Oral mucosa dry, Ear/Nose WNL grossly, dentition normal. Respiratory system: bilaterally diminished, no use of accessory muscle Cardiovascular system: S1 & S2 +, No JVD,. Gastrointestinal system: Abdomen soft,NT,ND, BS+ Nervous System:Alert, awake, moving extremities and grossly nonfocal Extremities: no edema, distal peripheral pulses palpable.  Skin: No rashes,no icterus. MSK: Normal muscle bulk,tone, power   Data Reviewed: I have personally reviewed following labs and imaging studies.  CBC: Recent Labs  Lab 08/18/21 1822 08/19/21 0712 08/20/21 0245 08/21/21 0156  WBC 11.2* 10.4 10.0 8.7  NEUTROABS 9.7* 9.0*  --   --   HGB 13.8 12.3* 12.2* 11.8*  HCT 45.2 41.4 39.4 38.8*  MCV 92.8 95.2 91.8 93.3  PLT 141* 116* 119* 0000000*   Basic Metabolic Panel: Recent Labs  Lab 08/19/21 0200 08/19/21 0712 08/19/21 1256 08/19/21 1647 08/19/21 2021 08/20/21 0245 08/20/21 0753 08/20/21 1333 08/20/21 2125 08/21/21 0156 08/21/21 0753  NA 160* 160* 165* 163*   < > 162* 160* 165* 159* 156* 153*  K 2.9* <2.0* 2.0* 2.0*  --  2.8*  --  2.5* 3.9 2.9*  --   CL 130* 129* >130* 129*  --  >130*  --  >130* 127* 126*  --   CO2 '23 23 24 26  '$ --  22  --  '25 23 22  '$ --   GLUCOSE 148* 136* 145* 124*  --  148*  --  140* 166* 154*  --   BUN 46* 37* 41* 44*  --  37*  --  33* 31* 27*  --   CREATININE 1.86* 1.83* 1.75* 1.76*  --  1.60*  --  1.43* 1.46* 1.39*  --    CALCIUM 8.2* 8.5* 8.5* 8.2*  --  8.2*  --  8.4* 8.8* 8.1*  --   MG 2.4 2.2 2.4  --   --  2.1  --   --   --   --   --   PHOS 1.9* 2.2*  --   --   --  3.2  --   --   --   --   --    < > = values in this interval not displayed.   GFR: Estimated Creatinine Clearance: 32.9 mL/min (A) (by C-G formula based on SCr of 1.39 mg/dL (H)). Liver Function Tests: Recent Labs  Lab 08/18/21 1822 08/19/21 0712 08/20/21 2125  AST 16 11* 20  ALT '13 12 8  '$ ALKPHOS 78 71 79  BILITOT 0.6 0.6 0.5  PROT 7.9 7.0 7.3  ALBUMIN 3.2* 2.9* 2.9*   No results for input(s): LIPASE, AMYLASE in the last 168 hours. No results for input(s): AMMONIA in the last 168 hours. Coagulation Profile: Recent Labs  Lab 08/18/21 1822  INR 1.2   Cardiac Enzymes: Recent Labs  Lab 08/19/21 0200  CKTOTAL 91   BNP (last 3 results) No results for input(s): PROBNP in the last 8760 hours. HbA1C: No results for input(s): HGBA1C in the last 72 hours. CBG: No results for input(s): GLUCAP in the last 168 hours. Lipid Profile: No results for input(s): CHOL, HDL, LDLCALC, TRIG, CHOLHDL, LDLDIRECT in the last 72 hours. Thyroid Function Tests: Recent Labs    08/19/21 1256  TSH 1.823   Anemia Panel: No results for input(s): VITAMINB12, FOLATE, FERRITIN, TIBC, IRON, RETICCTPCT in the last 72 hours. Sepsis Labs: Recent Labs  Lab 08/18/21 1822 08/18/21 2210  LATICACIDVEN 3.4* 2.0*    Recent Results (from the past 240 hour(s))  Resp Panel by RT-PCR (Flu A&B, Covid) Nasopharyngeal Swab     Status: None   Collection Time: 08/18/21  6:20 PM   Specimen: Nasopharyngeal Swab; Nasopharyngeal(NP) swabs in vial transport medium  Result Value Ref Range Status   SARS Coronavirus 2 by RT PCR NEGATIVE NEGATIVE Final    Comment: (NOTE) SARS-CoV-2 target nucleic acids are NOT DETECTED.  The SARS-CoV-2 RNA is generally detectable in upper respiratory specimens during the acute phase of infection. The lowest concentration of  SARS-CoV-2 viral copies this assay can detect is 138 copies/mL. A negative result does not preclude SARS-Cov-2 infection and should not be used as the sole basis for treatment or other patient management decisions. A negative result may occur with  improper specimen collection/handling, submission of specimen other than nasopharyngeal swab, presence of viral mutation(s) within the areas targeted by this assay, and inadequate number of viral copies(<138 copies/mL). A negative result must be combined with clinical observations, patient history, and epidemiological information. The expected result is Negative.  Fact Sheet for Patients:  EntrepreneurPulse.com.au  Fact Sheet for Healthcare Providers:  IncredibleEmployment.be  This test is no t yet approved or cleared by the [place] FDA and  has been authorized for detection and/or diagnosis of SARS-CoV-2 by FDA under an Emergency Use Authorization (EUA). This EUA will remain  in effect (meaning this test can be used) for the duration of the COVID-19 declaration under Section 564(b)(1) of the Act, 21 U.S.C.section 360bbb-3(b)(1), unless the authorization is terminated  or revoked sooner.       Influenza A by PCR NEGATIVE NEGATIVE Final   Influenza B by PCR NEGATIVE NEGATIVE Final    Comment: (NOTE) The Xpert Xpress SARS-CoV-2/FLU/RSV plus assay is intended as an aid in the diagnosis of influenza from Nasopharyngeal swab  specimens and should not be used as a sole basis for treatment. Nasal washings and aspirates are unacceptable for Xpert Xpress SARS-CoV-2/FLU/RSV testing.  Fact Sheet for Patients: EntrepreneurPulse.com.au  Fact Sheet for Healthcare Providers: IncredibleEmployment.be  This test is not yet approved or cleared by the [place] FDA and has been authorized for detection and/or diagnosis of SARS-CoV-2 by FDA under an Emergency Use  Authorization (EUA). This EUA will remain in effect (meaning this test can be used) for the duration of the COVID-19 declaration under Section 564(b)(1) of the Act, 21 U.S.C. section 360bbb-3(b)(1), unless the authorization is terminated or revoked.  Performed at [facility], [place] [address] [place], [place] [address]   Urine Culture     Status: Abnormal   Collection Time: 08/19/21  2:08 AM   Specimen: Urine, Clean Catch  Result Value Ref Range Status   Specimen Description   Final    URINE, CLEAN CATCH Performed at [facility], [place] [address] [place], [place] [address]    Special Requests   Final    NONE Performed at [facility], [place] [address], [place], [place] [address]    Culture (A)  Final    >=100,000 COLONIES/mL AEROCOCCUS SPECIES Standardized susceptibility testing for this organism is not available. Performed at [facility], [place] [address], [place], [place] [address]    Report Status 08/20/2021 FINAL  Final      Radiology Studies: No results found.   LOS: 3 days   Antonieta Pert, MD [facility]  08/21/2021, 11:46 AM

## 2021-08-21 NOTE — Progress Notes (Signed)
Palliative Care Progress Note, Assessment & Plan   Patient Name: Casey Fisher       Date: 08/21/2021 DOB: 1928-10-09  Age: 87 y.o. MRN#: [id number] Attending Physician: Antonieta Pert, MD Primary Care Physician: [name], MD Admit Date: 08/18/2021  Reason for Consultation/Follow-up: Establishing goals of care  Subjective: 87 y.o. male admitted 2 days ago from SNF for hypernatremia. PMH significant for prostate cancer, parkinsonism, neuromuscular disorder, memory impairment/dementia on Aricept, chronic anemia, peripheral neuropathy chronic diastolic CHF, BPH.  He was admitted 8/5-8/11 for this issue, d/c to SNF, and then returned to ED with poor oral intake and abnormal labs.   Plan of Care: I have reviewed medical records including EPIC notes, labs and imaging, received report from bedside RN Iris, assessed the patient and then met with patient at bedside to discuss diagnosis prognosis, GOC, EOL wishes, disposition and options.  I introduced Palliative Medicine as specialized medical care for people living with serious illness. It focuses on providing relief from the symptoms and stress of a serious illness. The goal is to improve quality of life for both the patient and the family.  As far as functional status, as per PT patient is requiring +2 assistance for mobility.  As far a nutritional status, he is not eating or drinking anything.   We discussed patient's current illness and what it means in the larger context of patient's on-going co-morbidities. I reviewed that his body appears to be showing [place] signs of end of life. His hypernatremia is not improving despite IVF. Given his decline in oral intake I shared that I do not see his underlying hypernatremia improving. Natural disease trajectory  and expectations at EOL were discussed.  I shared my concern that the patient's time may be short.  He nodded in agreement.   I attempted to elicit values and goals of care important to the patient.  He shared that he would just like to 'sit in the shade'.  When asked what he meant by this he just meant he would like to 'sit in the shade'.  I shared that an inpatient hospice facility would best meet his needs at this time.  He nodded in agreement.  When asked if he had any questions he stayed silent.   When asked if he had any questions or concerns he said that he was little bit hot.  The air conditioner was set to 80 degrees.  I turned it down and let him know that the air temperature should correct soon.   I am not clear if he understands and is just stoic or if he does not fully comprehend what is happening. So, with the patient's persmission I spoke with his daughter on the phone who seems to think he absolutely understands what is going on and has just needed to say his peace.  She said his comment of 'sitting in the shade' signals to her that he is accepting of transitioning to EOL.  She is in agreement with the inpatient hospice facility referral.  I let her know that we will continue the fluids until tomorrow but that is not sign that we will continue after he is discharged from the hospital.  She plans on visiting on Saturday but shared that she could certainly return in person before then if need be.    Discussed with patient/family the importance of continued conversation with family and the medical providers regarding overall plan of care and treatment options, ensuring decisions are within the context of the patient's values and GOCs.    Questions and concerns were addressed. The family was encouraged to call with questions or concerns.   Code Status: DNR  Prognosis:  < 2 weeks  Discharge Planning: Hospice facility  Recommendations/Plan: Referral to be placed to [place] D/c  IVF tomorrow  Care plan was discussed with TOC, patient, patient's daughter  Length of Stay: 3  Physical Exam HENT:     Head:     Comments: Temporal wasting Cardiovascular:     Rate and Rhythm: Normal rate.  Pulmonary:     Effort: Pulmonary effort is normal.  Musculoskeletal:     Comments: Weak, unable to reposition himself in bed  Skin:    General: Skin is warm and dry.  Neurological:     Mental Status: He is alert. Mental status is at baseline.            Vital Signs: BP 140/90 (BP Location: Right Arm)   Pulse 71   Temp (!) 97.5 F (36.4 C) (Oral)   Resp 18   Ht _0  (1.905 m)   Wt 67.1 kg   SpO2 96%   BMI 18.49 kg/m  SpO2: SpO2: 96 % O2 Device: O2 Device: Room Air O2 Flow Rate:        Palliative Assessment/Data: 10%    Flowsheet Rows    Flowsheet Row Most Recent Value  Intake Tab   Referral Department Hospitalist  Unit at Time of Referral Med/Surg Unit  Palliative Care Primary Diagnosis Neurology  Date Notified 08/18/21  Palliative Care Type Return patient Palliative Care  Reason for referral Clarify Goals of Care  Date of Admission 08/18/21  Date first seen by Palliative Care 08/19/21  # of days Palliative referral response time 1 Day(s)  # of days IP prior to Palliative referral 0  Clinical Assessment   Palliative Performance Scale Score 30%  Psychosocial & Spiritual Assessment   Palliative Care Outcomes   Patient/Family meeting held? Yes  Who was at the meeting? Patient, daughter        Total Time 50 minutes Prolonged Time Billed  no   Greater than 50%  of this time was spent counseling and coordinating care related to the above assessment and plan.  Thank you for allowing the Palliative Medicine Team to assist in the care of this patient.  Micheline Rough, MD [place] Team [phone]

## 2021-08-21 NOTE — Progress Notes (Signed)
Daily Progress Note   Patient Name: Casey Fisher       Date: 08/21/2021 DOB: [birth year]  Age: 87 y.o. MRN#: [id number] Attending Physician: Antonieta Pert, MD Primary Care Physician: [name], MD Admit Date: 08/18/2021  Reason for Consultation/Follow-up: Establishing goals of care  Subjective: I saw and examined Casey Fisher today.  He was lying in bed in no distress.  I sat with him for a long time attempting to engage him in conversation to ascertain his understanding of his situation.  He would open his eyes and answer a few simple questions (denying pain, shortness of breath, etc.), but he was either unable or unwilling to really engage in any meaningful conversation regarding his current situation or long-term goals of care.  I attempted to discuss with him concerns that his body is shutting down as demonstrated by continued poor intake and recurrent hypernatremia.  He continued to appear to sit quietly and listen but did not really participate in conversation.  I called and was able to reach his daughter, [name].  I shared with her my encounter with her father and [name] thinks that he is listening and understanding but refusing to participate in conversation as she does not think that he is hearing the options that he wants to hear regarding next steps (which she believes would be to go home but this is not realistic option).  Discussed that if he continues to be unable to participate in conversation regarding long-term goals, we will need to rely on her to help make decisions on his behalf.  We discussed plan for me to attempt to engage him 1 more time tomorrow but if he is not really willing to to precipitate, we will need to work with her on his behalf to make decisions  regarding care plan.  Length of Stay: 3  Current Medications: Scheduled Meds:   amLODipine  10 mg Oral Daily   carbidopa-levodopa  1 tablet Oral TID   donepezil  5 mg Oral QHS   DULoxetine  30 mg Oral Daily   enoxaparin (LOVENOX) injection  70 mg Subcutaneous Q24H   feeding supplement  237 mL Oral BID BM   multivitamin with minerals  1 tablet Oral Daily   thiamine injection  100 mg Intravenous Daily    Continuous Infusions:  cefTRIAXone (  ROCEPHIN)  IV 200 mL/hr at 08/21/21 0230   dextrose 5 % with KCl 20 mEq / L Stopped (08/21/21 0217)    PRN Meds: acetaminophen **OR** acetaminophen, bisacodyl  Physical Exam  General: Alert, awake, in no acute distress.  Does not really participate in conversation.  Frail and chronically ill-appearing.  Full HEENT: No bruits, no goiter, no JVD Heart: Regular rate and rhythm. No murmur appreciated. Lungs: Good air movement, clear Abdomen: Soft, nontender, nondistended, positive bowel sounds.   Ext: No significant edema Skin: Warm and dry          Vital Signs: BP 140/90 (BP Location: Right Arm)   Pulse 71   Temp (!) 97.5 F (36.4 C) (Oral)   Resp 18   Ht '6\' 3"'$  (1.905 m)   Wt 67.1 kg   SpO2 96%   BMI 18.49 kg/m  SpO2: SpO2: 96 % O2 Device: O2 Device: Room Air O2 Flow Rate:    Intake/output summary:  Intake/Output Summary (Last 24 hours) at 08/21/2021 0549 Last data filed at 08/21/2021 0413 Gross per 24 hour  Intake 1906.03 ml  Output 100 ml  Net 1806.03 ml   LBM: Last BM Date: 08/21/21 Baseline Weight: Weight: 67.1 kg Most recent weight: Weight: 67.1 kg       Palliative Assessment/Data:    Flowsheet Rows    Flowsheet Row Most Recent Value  Intake Tab   Referral Department Hospitalist  Unit at Time of Referral Med/Surg Unit  Palliative Care Primary Diagnosis Neurology  Date Notified 08/18/21  Palliative Care Type Return patient Palliative Care  Reason for referral Clarify Goals of Care  Date of Admission 08/18/21   Date first seen by Palliative Care 08/19/21  # of days Palliative referral response time 1 Day(s)  # of days IP prior to Palliative referral 0  Clinical Assessment   Palliative Performance Scale Score 30%  Psychosocial & Spiritual Assessment   Palliative Care Outcomes   Patient/Family meeting held? Yes  Who was at the meeting? Patient, daughter       Patient Active Problem List   Diagnosis Date Noted   Acute lower UTI 08/19/2021   Ileus ([place]) 08/19/2021   Protein-calorie malnutrition, severe 08/05/2021   Hypernatremia 08/01/2021   AKI (acute kidney injury) ([place]) 08/01/2021   Decreased oral intake 08/01/2021   Dehydration 08/01/2021   Parkinsonism ([place]) 08/01/2021   Parkinson's disease ([place]) 05/07/2021   Primary osteoarthritis of right knee 05/23/2020   Low back pain 08/03/2019   Acute pain of left shoulder 10/10/2018   Chest pain 11/08/2017   Memory difficulty 03/24/2016   Hereditary and idiopathic peripheral neuropathy 03/24/2016   Abnormality of gait 03/24/2016   Peripheral neuropathy 04/18/2015   Hyperlipidemia 04/18/2015   Prostate cancer ([place]) 08/24/2014   Right inguinal hernia 10/30/2013    Palliative Care Assessment & Plan   Patient Profile: Casey Fisher is a 87 year old male with PMHx prostate cancer, parkonsinism, neuromuscular d/o, memory impairment on Aricept, chronic anemia, peripheral neuropathy, chronic diastolic heart failure, BPH and recent admission for severe hyponatremia, hypokalemia, and AKI due to poor oral intake who was discharged to skilled facility and returns with severe hypernatremia, acute encephalopathy, failure to thrive, UTI, and AKI.    Recommendations/Plan: Continue current care. Continue to work to establish long-term goals of care in conjunction with patient and his daughter.  His daughter is helpful to Casey Fisher will participate in conversation.  I have attempted to engage him both yesterday and today.  Plan  to attempt 1 more  time tomorrow, however, if he is not able to participate, his daughter understands that we will be looking to her to help make decisions on his behalf. She expressed that she thinks he may be well served by transition to residential hospice.  We will continue to closely follow his intake, however, with 2 recurrent hospitalizations in the past few weeks with hyponatremia related to poor intake, he is likely to be the last couple weeks of life if transition is made to focus on comfort.  Code Status:    Code Status Orders  (From admission, onward)           Start     Ordered   08/19/21 0742  Do not attempt resuscitation (DNR)  Continuous       Question Answer Comment  In the event of cardiac or respiratory ARREST Do not call a "code blue"   In the event of cardiac or respiratory ARREST Do not perform Intubation, CPR, defibrillation or ACLS   In the event of cardiac or respiratory ARREST Use medication by any route, position, wound care, and other measures to relive pain and suffering. May use oxygen, suction and manual treatment of airway obstruction as needed for comfort.      08/19/21 0741           Code Status History     Date Active Date Inactive Code Status Order ID Comments User Context   08/02/2021 0107 08/07/2021 1843 DNR [id number]  Mariel Aloe, MD Inpatient   11/08/2017 0650 11/08/2017 1937 Full Code [id number]  Norval Morton, MD ED      Advance Directive Documentation    Flowsheet Row Most Recent Value  Type of Advance Directive Healthcare Power of Attorney  Pre-existing out of facility DNR order (yellow form or pink MOST form) --  "MOST" Form in Place? --       Prognosis: ? < 2 weeks?  Discharge Planning: To Be Determined  Care plan was discussed with patient, daughter, bedside staff  Thank you for allowing the Palliative Medicine Team to assist in the care of this patient.   Time In: 1520 Time Out: 1600 Total Time 40 Prolonged Time Billed No       Greater than 50%  of this time was spent counseling and coordinating care related to the above assessment and plan.  Micheline Rough, MD  Please contact Palliative Medicine Team phone at [phone] for questions and concerns.

## 2021-08-22 DIAGNOSIS — N179 Acute kidney failure, unspecified: Secondary | ICD-10-CM | POA: Diagnosis not present

## 2021-08-22 LAB — BASIC METABOLIC PANEL
Anion gap: 9 (ref 5–15)
BUN: 20 mg/dL (ref 8–23)
CO2: 21 mmol/L — ABNORMAL LOW (ref 22–32)
Calcium: 8.1 mg/dL — ABNORMAL LOW (ref 8.9–10.3)
Chloride: 120 mmol/L — ABNORMAL HIGH (ref 98–111)
Creatinine, Ser: 1.27 mg/dL — ABNORMAL HIGH (ref 0.61–1.24)
GFR, Estimated: 53 mL/min — ABNORMAL LOW (ref >60)
Glucose, Bld: 131 mg/dL — ABNORMAL HIGH (ref 70–99)
Potassium: 2.8 mmol/L — ABNORMAL LOW (ref 3.5–5.1)
Sodium: 150 mmol/L — ABNORMAL HIGH (ref 135–145)

## 2021-08-22 LAB — CBC
HCT: 37.7 % — ABNORMAL LOW (ref 39.0–52.0)
Hemoglobin: 11.3 g/dL — ABNORMAL LOW (ref 13.0–17.0)
MCH: 28.2 pg (ref 26.0–34.0)
MCHC: 30 g/dL (ref 30.0–36.0)
MCV: 94 fL (ref 80.0–100.0)
Platelets: 128 10*3/uL — ABNORMAL LOW (ref 150–400)
RBC: 4.01 MIL/uL — ABNORMAL LOW (ref 4.22–5.81)
RDW: 17.1 % — ABNORMAL HIGH (ref 11.5–15.5)
WBC: 8.8 10*3/uL (ref 4.0–10.5)
nRBC: 0.2 % (ref 0.0–0.2)

## 2021-08-22 MED ORDER — ACETAMINOPHEN 325 MG PO TABS: 650.0000 mg | ORAL_TABLET | Freq: Four times a day (QID) | ORAL | Status: DC | PRN

## 2021-08-22 MED ORDER — POTASSIUM CHLORIDE 10 MEQ/100ML IV SOLN: 10.0000 meq | INTRAVENOUS | Status: DC

## 2021-08-22 MED ORDER — BISACODYL 10 MG RE SUPP
10.0000 mg | Freq: Every day | RECTAL | 0 refills | Status: DC | PRN
Start: 2021-08-22 — End: 2023-04-09

## 2021-08-22 MED ORDER — POTASSIUM CHLORIDE 10 MEQ/100ML IV SOLN
10.0000 meq | INTRAVENOUS | Status: AC
Start: 2021-08-22 — End: 2021-08-22
  Administered 2021-08-22 (×4): 10 meq via INTRAVENOUS
  Filled 2021-08-22 (×4): qty 100

## 2021-08-22 NOTE — Care Management Important Message (Signed)
Medicare IM printed for [facility] to give to the patient. 

## 2021-08-22 NOTE — Progress Notes (Signed)
Palliative Care brief note  Casey Fisher is stable for d/c to [facility] today.  Please call with any palliative care specific needs.  Micheline Rough, MD [place] Palliative Medicine Team [phone]  NO CHARGE NOTE

## 2021-08-22 NOTE — Discharge Summary (Signed)
Physician Discharge Summary  Casey Fisher [id number] DOB: 1928/07/25 DOA: 08/18/2021  PCP: [name], MD  Admit date: 08/18/2021 Discharge date: 08/22/2021  Admitted From: SNF Disposition:  HOSPICE   Recommendations for Outpatient Follow-up:  Inpatient hospice  Home Health:NO  Equipment/Devices: NONE  Discharge Condition: Stable Code Status:   Code Status: DNR Diet recommendation:  Diet Order             DIET DYS 3 Room service appropriate? Yes; Fluid consistency: Thin  Diet effective now                    Brief/Interim Summary: 87 year old male with history of prostate cancer, parkinsonism, neuromuscular disorder, memory impairment/dementia on Aricept, chronic anemia, peripheral neuropathy chronic diastolic CHF last echo [id number] DD , BPH on Flomax recent hospital admission from 8/5-07/2010 for severe hypernatremia 166, hypokalemia, AKI from poor oral intake and discharged to skilled nursing facility sent from the rehab facility due to abnormal labs poor oral intake.  Patient unable to provide history on admission. In the ED sodium 163 potassium 2.1 lactic acidosis 3.1 appears very dehydrated no evidence of infection on chest x-ray and UA was pending.  UA came back with turbid, leukocyte large nitrate negative WBC more than 50 placed on ceftriaxone,IV fluids and admitted Patient was treated for severe hypernatremia due to volume depletion and poor oral intake failure to thrive, acute metabolic encephalopathy, severe protein calorie malnutrition, UTI acute renal failure severe hypokalemia hypophosphatemia. Patient's electrolytes are improving this is at secondary site for similar issues.  Further discussion will help palliative was consulted and at this time patient's daughter has decided on hospice care and patient is being discharged to inpatient hospice house [facility].  Discharge Diagnoses:  Severe hypernatremia with volume depletion/dehydration from  poor oral intake: Managed with aggressive IV fluid hydration.  Recent Labs  Lab 08/21/21 0156 08/21/21 0753 08/21/21 1411 08/21/21 2032 08/22/21 0200  NA 156* 153* 151* 150* 150*    Acute metabolic encephalopathy at baseline with mild dementia but interactive per daughter.  Has been declining past 1 month.    Failure to thrive Severe protein calorie malnutrition with poor intake UTI: Urine culture shows Aerococcus species -being treated with penicillin , now stopped   AKI: Prerenal due to poor intake/failure to thrive Recent Labs    08/18/21 1822 08/19/21 0200 08/19/21 0712 08/19/21 1256 08/19/21 1647 08/20/21 0245 08/20/21 1333 08/20/21 2125 08/21/21 0156 08/22/21 0200  BUN 53* 46* 37* 41* 44* 37* 33* 31* 27* 20  CREATININE 2.23* 1.86* 1.83* 1.75* 1.76* 1.60* 1.43* 1.46* 1.39* 1.27*   Hypokalemia severe low -was being repleted aggressively this is from his poor oral intake  Recent Labs  Lab 08/20/21 0245 08/20/21 1333 08/20/21 2125 08/21/21 0156 08/22/21 0200  K 2.8* 2.5* 3.9 2.9* 2.8*   Hypophosphatemia resolved  Abnormal KUB worrisome for ileus but no nausea vomiting tolerating p.o.  CT abdomen pelvis was done that showed mild gaseous distention of the colon suggestive of mild colonic ileus no evidence of obstruction, cholelithiasis.  Continue supportive measures. Diet as tolerated.  Constipation- Prostate cancer  BPH HLD  Dementia at baseline alert awake and eating a month ago. Declining  Low albumin level/leg edema  DVT in right lower extremity-ultrasound shows age-indeterminate deep vein thrombosis involving the right femoral vein right popliteal vein right posterior tibial vein and right peroneal vein and chronic deep vein thrombosis on the right external iliac vein, common femoral vein SF junction and  right femoral vein.  Was placed on heparin drip but had IV access issues and  was transitioned to renally dosed Lovenox  8/23- now stopped meds for  hospice Hypertension  Chronic diastolic CHF:  Goals of care: After extensive family meeting given patient's recurrent admission due to poor oral intake in the setting of dementia and declining with advanced age and FTT- felt appropriate for palliative care and daughter has decided for hospice.  For Hospice house today  Consults: Nephrology Palliative care  Subjective: Mildly lethargic eyes open does not interact verbally. Discharge Exam: Vitals:   08/21/21 2117 08/22/21 0339  BP: 106/69 (!) 146/96  Pulse: 73 68  Resp: 19 20  Temp: 98.6 F (37 C) 97.6 F (36.4 C)  SpO2: 93% 99%   General: Pt is alert, mildly lethargic, awake, not in acute distress Cardiovascular: RRR, S1/S2 +, no rubs, no gallops Respiratory: CTA bilaterally, no wheezing, no rhonchi Abdominal: Soft, NT, ND, bowel sounds + Extremities: no edema, no cyanosis  Discharge Instructions   Allergies as of 08/22/2021       Reactions   Lyrica [pregabalin] Other (See Comments)   "constipation"        Medication List     STOP taking these medications    amLODipine 10 MG tablet Commonly known as: NORVASC   ammonium lactate 12 % lotion Commonly known as: AmLactin   CALCIUM 600 + D PO   dextrose 5 % solution   Erleada 60 MG tablet Generic drug: apalutamide   gabapentin 250 MG/5ML solution Commonly known as: NEURONTIN   magnesium oxide 400 MG tablet Commonly known as: MAG-OX   simvastatin 40 MG tablet Commonly known as: ZOCOR   tamsulosin 0.4 MG Caps capsule Commonly known as: FLOMAX       TAKE these medications    acetaminophen 325 MG tablet Commonly known as: TYLENOL Take 2 tablets (650 mg total) by mouth every 6 (six) hours as needed for mild pain (or Fever >/= 101). What changed: reasons to take this   bisacodyl 10 MG suppository Commonly known as: DULCOLAX Place 1 suppository (10 mg total) rectally daily as needed for moderate constipation.   carbidopa-levodopa 25-250 MG  tablet Commonly known as: Sinemet Take 1 tablet by mouth 3 (three) times daily.   donepezil 5 MG tablet Commonly known as: ARICEPT Take 1 tablet (5 mg total) by mouth at bedtime.   DULoxetine 30 MG capsule Commonly known as: CYMBALTA Take 1 capsule (30 mg total) by mouth 2 (two) times daily.   mirtazapine 7.5 MG tablet Commonly known as: REMERON Take 7.5 mg by mouth at bedtime.   potassium chloride SA 20 MEQ tablet Commonly known as: KLOR-CON Take 40 mEq by mouth 3 (three) times daily.        Allergies  Allergen Reactions   Lyrica [Pregabalin] Other (See Comments)    "constipation"    The results of significant diagnostics from this hospitalization (including imaging, microbiology, ancillary and laboratory) are listed below for reference.    Microbiology: Recent Results (from the past 240 hour(s))  Resp Panel by RT-PCR (Flu A&B, Covid) Nasopharyngeal Swab     Status: None   Collection Time: 08/18/21  6:20 PM   Specimen: Nasopharyngeal Swab; Nasopharyngeal(NP) swabs in vial transport medium  Result Value Ref Range Status   SARS Coronavirus 2 by RT PCR NEGATIVE NEGATIVE Final    Comment: (NOTE) SARS-CoV-2 target nucleic acids are NOT DETECTED.  The SARS-CoV-2 RNA is generally detectable in upper respiratory specimens  during the acute phase of infection. The lowest concentration of SARS-CoV-2 viral copies this assay can detect is 138 copies/mL. A negative result does not preclude SARS-Cov-2 infection and should not be used as the sole basis for treatment or other patient management decisions. A negative result may occur with  improper specimen collection/handling, submission of specimen other than nasopharyngeal swab, presence of viral mutation(s) within the areas targeted by this assay, and inadequate number of viral copies(<138 copies/mL). A negative result must be combined with clinical observations, patient history, and epidemiological information. The expected  result is Negative.  Fact Sheet for Patients:  EntrepreneurPulse.com.au  Fact Sheet for Healthcare Providers:  IncredibleEmployment.be  This test is no t yet approved or cleared by the [place] FDA and  has been authorized for detection and/or diagnosis of SARS-CoV-2 by FDA under an Emergency Use Authorization (EUA). This EUA will remain  in effect (meaning this test can be used) for the duration of the COVID-19 declaration under Section 564(b)(1) of the Act, 21 U.S.C.section 360bbb-3(b)(1), unless the authorization is terminated  or revoked sooner.       Influenza A by PCR NEGATIVE NEGATIVE Final   Influenza B by PCR NEGATIVE NEGATIVE Final    Comment: (NOTE) The Xpert Xpress SARS-CoV-2/FLU/RSV plus assay is intended as an aid in the diagnosis of influenza from Nasopharyngeal swab specimens and should not be used as a sole basis for treatment. Nasal washings and aspirates are unacceptable for Xpert Xpress SARS-CoV-2/FLU/RSV testing.  Fact Sheet for Patients: EntrepreneurPulse.com.au  Fact Sheet for Healthcare Providers: IncredibleEmployment.be  This test is not yet approved or cleared by the [place] FDA and has been authorized for detection and/or diagnosis of SARS-CoV-2 by FDA under an Emergency Use Authorization (EUA). This EUA will remain in effect (meaning this test can be used) for the duration of the COVID-19 declaration under Section 564(b)(1) of the Act, 21 U.S.C. section 360bbb-3(b)(1), unless the authorization is terminated or revoked.  Performed at [facility] [place] [address], [place], [place] [address]   Urine Culture     Status: Abnormal   Collection Time: 08/19/21  2:08 AM   Specimen: Urine, Clean Catch  Result Value Ref Range Status   Specimen Description   Final    URINE, CLEAN CATCH Performed at [facility], [place] [address],  [place], [place] [address]    Special Requests   Final    NONE Performed at [facility], [place] [address], [place], [place] [address]    Culture (A)  Final    >=100,000 COLONIES/mL AEROCOCCUS SPECIES Standardized susceptibility testing for this organism is not available. Performed at [facility], [place] [address], [place], [place] [address]    Report Status 08/20/2021 FINAL  Final    Procedures/Studies: CT ABDOMEN PELVIS WO CONTRAST  Result Date: 08/19/2021 CLINICAL DATA:  Bowel obstruction, anorexia, abdominal distension EXAM: CT ABDOMEN AND PELVIS WITHOUT CONTRAST TECHNIQUE: Multidetector CT imaging of the abdomen and pelvis was performed following the standard protocol without IV contrast. COMPARISON:  10/29/2016 FINDINGS: Lower chest: Visualized lung bases are clear bilaterally. Mild coronary artery calcification. Global cardiac size within normal limits. Hepatobiliary: Cholelithiasis without pericholecystic inflammatory change. Stable subserosal cyst noted within the left hepatic lobe. Liver is otherwise unremarkable. No intra or extrahepatic biliary ductal dilation. Pancreas: Unremarkable Spleen: Unremarkable Adrenals/Urinary Tract: The adrenal glands are unremarkable. The kidneys are mildly atrophic, but are otherwise unremarkable. The bladder is distended but is otherwise unremarkable. Stomach/Bowel:  There is mild gaseous distension of the a colon most in keeping with a mild colonic ileus. No evidence of obstruction. Fluid within the rectal vault may present clinically as diarrhea. High density material within the rectal vault may relate to prior contrast study or enema. The stomach, small bowel, and large bowel are otherwise unremarkable. Appendix normal. No free intraperitoneal gas or fluid. Vascular/Lymphatic: Moderate aortoiliac atherosclerotic calcification. No aortic aneurysm. No pathologic adenopathy within the abdomen and pelvis. Reproductive: Brachytherapy seeds seen  within the prostate gland. Other: Tiny fat containing umbilical hernia. Musculoskeletal: Degenerative changes are seen within the lumbar spine. Moderate lumbar dextroscoliosis noted. No acute bone abnormality. IMPRESSION: Mild gaseous distension of the colon suggesting a mild colonic ileus. No evidence of obstruction. Fluid within the rectal vault possibly presenting clinically as diarrhea. Cholelithiasis. Mild bladder distension possibly related to voluntary retention or bladder outlet obstruction. Aortic Atherosclerosis (ICD10-I70.0). Electronically Signed   By: Fidela Salisbury M.D.   On: 08/19/2021 03:16   DG Abd 1 View  Result Date: 08/18/2021 CLINICAL DATA:  Abdominal distension. EXAM: ABDOMEN - 1 VIEW COMPARISON:  None. FINDINGS: Diffuse gaseous distension of the colon may represent ileus. Clinical correlation is recommended. No definite small bowel dilatation. Evaluation however is limited on the provided images. No free air identified. There is degenerative changes of the spine. No acute osseous pathology. IMPRESSION: Diffuse gaseous distension of the colon may represent adynamic ileus. Clinical correlation is recommended. Electronically Signed   By: Anner Crete M.D.   On: 08/18/2021 21:27   CT HEAD WO CONTRAST (5MM)  Result Date: 08/01/2021 CLINICAL DATA:  Parkinson's disease.  Generalized weakness. EXAM: CT HEAD WITHOUT CONTRAST TECHNIQUE: Contiguous axial images were obtained from the base of the skull through the vertex without intravenous contrast. COMPARISON:  06/07/2021 FINDINGS: Brain: There is no evidence of an acute infarct, intracranial hemorrhage, mass, midline shift, or extra-axial fluid collection. Confluent hypodensities in the cerebral white matter bilaterally are unchanged and nonspecific but compatible with severe chronic small vessel ischemic disease. A chronic infarct is again noted involving the right basal ganglia and internal capsule. There is mild-to-moderate cerebral  atrophy. Vascular: Calcified atherosclerosis at the skull base. No hyperdense vessel. Skull: No fracture or suspicious osseous lesion. Sinuses/Orbits: Paranasal sinuses and mastoid air cells are clear. Bilateral cataract extraction. Other: None. IMPRESSION: 1. No evidence of acute intracranial abnormality. 2. Severe chronic small vessel ischemic disease. Electronically Signed   By: Logan Bores M.D.   On: 08/01/2021 16:29   DG Chest Port 1 View  Result Date: 08/18/2021 CLINICAL DATA:  Dyspnea. EXAM: PORTABLE CHEST 1 VIEW COMPARISON:  08/01/2021 FINDINGS: Stable cardiomediastinal contours. Unchanged asymmetric elevation of the left hemidiaphragm. Atelectasis is noted in the left base. Asymmetric increase interstitial markings are noted within the left lung. Right lung appears clear. IMPRESSION: 1. Asymmetric increase interstitial markings in the left lung may represent asymmetric edema or atypical infection. 2. Stable asymmetric elevation of left hemidiaphragm. Electronically Signed   By: Kerby Moors M.D.   On: 08/18/2021 18:19   DG Chest Portable 1 View  Result Date: 08/01/2021 CLINICAL DATA:  Weakness EXAM: PORTABLE CHEST 1 VIEW COMPARISON:  06/07/2021, CT 10/29/2016 FINDINGS: Right lung grossly clear. Hazy atelectasis at the left base. No pleural effusion. Normal cardiac size. No pneumothorax. IMPRESSION: No active disease. Mild chronic elevation left diaphragm with probable scarring and atelectasis Electronically Signed   By: Donavan Foil M.D.   On: 08/01/2021 16:21   DG Swallowing Func-Speech Pathology  Result  Date: 08/04/2021 Formatting of this result is different from the original. Objective Swallowing Evaluation: Type of Study: MBS-Modified Barium Swallow Study  Patient Details Name: JAH MCQUEARY MRN: [id number] Date of Birth: 1928-07-01 Today's Date: 08/04/2021 Time: SLP Start Time (ACUTE ONLY): 1356 -SLP Stop Time (ACUTE ONLY): 1410 SLP Time Calculation (min) (ACUTE ONLY): 14 min Past Medical  History: Past Medical History: Diagnosis Date  Abnormality of gait 03/24/2016  Anxiety   Cancer ([place])   Cataract   Depression   Heart murmur   Hereditary and idiopathic peripheral neuropathy 03/24/2016  Hypertension   Memory difficulty 03/24/2016  Neuromuscular disorder ([place])   Neuropathy   Parkinson's disease ([place]) 05/07/2021  Prostate cancer ([place])   remission Past Surgical History: Past Surgical History: Procedure Laterality Date  CATARACT EXTRACTION Bilateral   EYE SURGERY    HERNIA REPAIR  1985  INGUINAL HERNIA REPAIR Right 12/06/2013  Procedure: HERNIA REPAIR INGUINAL ADULT;  Surgeon: Merrie Roof, MD;  Location: [facility];  Service: General;  Laterality: Right;  INSERTION OF MESH Right 12/06/2013  Procedure: INSERTION OF MESH;  Surgeon: Merrie Roof, MD;  Location: [facility];  Service: General;  Laterality: Right;  PROSTATE SURGERY   HPI: Pt is a 87 y.o. male brought to ED by family with reports of decreased responsiveness and poor p.o. intake. Pt has not been swallowing well for about 2 weeks and has gradually worsened to where he takes very small mounts of food. He consumes very soft/liquid foods like applesauce and jello. Labs upon admission revealed pt is hypernatremic. Chest xray (08/01/21) revealed no active disease. CT head (08/01/21) revealed No evidence of acute intracranial abnormality. PMH: prostate cancer, parkinsonism/neuromuscular disorder, memory difficulty, peripheral neuropathy.  No data recorded Assessment / Plan / Recommendation CHL IP CLINICAL IMPRESSIONS 08/04/2021 Clinical Impression Pt presents with primary oral dysphagia with mild pharyngeal dysphagia and likely cognitive component. He exhibited decreased bolus cohesion, lingual pumping and prolonged bolus hold with solids, requiring repeat liquid washes to initiate swallow, with lingual/palatal residue remaining. Pt occasionally performed spontaneous dry swallows to clear residue, but otherwise required max verbal cues and liquid washes, which did  not consistently eliminate residue. Thin liquids via straw resulted in only x1 instance of flash penetration (PAS 2) with any material being ejected from airway via strength of laryngeal vestibule closure. This is considered WFL. It should be noted that a delay in swallow initation was observed with thin liquids, which filled the pyriform sinuses and vallecular space before swallow was initated, but again, no penetration/aspiration noted except in described episode. Pt was unable to achieve A-P transit of pill whole with puree and it was ultimately manually removed from oral cavity. Recommend dysphagia 1 (puree)/thin liquid diet with family/staff to assist pt with small bites/sips, slow rate of intake, and alternation of solids with liquids. SLP to f/u for tolerance. SLP Visit Diagnosis Dysphagia, oropharyngeal phase (R13.12) Attention and concentration deficit following -- Frontal lobe and executive function deficit following -- Impact on safety and function Mild aspiration risk   CHL IP TREATMENT RECOMMENDATION 08/04/2021 Treatment Recommendations Therapy as outlined in treatment plan below   Prognosis 08/04/2021 Prognosis for Safe Diet Advancement Fair Barriers to Reach Goals Cognitive deficits Barriers/Prognosis Comment -- CHL IP DIET RECOMMENDATION 08/04/2021 SLP Diet Recommendations Dysphagia 1 (Puree) solids;Thin liquid Liquid Administration via Cup;Straw Medication Administration Crushed with puree Compensations Minimize environmental distractions;Slow rate;Small sips/bites;Follow solids with liquid Postural Changes Seated upright at 90 degrees   CHL IP OTHER RECOMMENDATIONS 08/04/2021  Recommended Consults -- Oral Care Recommendations Oral care BID;Staff/trained caregiver to provide oral care Other Recommendations --   CHL IP FOLLOW UP RECOMMENDATIONS 08/04/2021 Follow up Recommendations 24 hour supervision/assistance;Skilled Nursing facility   [facility] IP FREQUENCY AND DURATION 08/04/2021 Speech Therapy Frequency (ACUTE  ONLY) min 2x/week Treatment Duration 2 weeks      CHL IP ORAL PHASE 08/04/2021 Oral Phase Impaired Oral - Pudding Teaspoon -- Oral - Pudding Cup -- Oral - Honey Teaspoon -- Oral - Honey Cup -- Oral - Nectar Teaspoon -- Oral - Nectar Cup -- Oral - Nectar Straw -- Oral - Thin Teaspoon -- Oral - Thin Cup -- Oral - Thin Straw Lingual pumping;Holding of bolus;Lingual/palatal residue;Decreased bolus cohesion Oral - Puree Decreased bolus cohesion;Lingual/palatal residue;Holding of bolus;Lingual pumping Oral - Mech Soft -- Oral - Regular Impaired mastication;Lingual pumping;Holding of bolus;Decreased bolus cohesion;Lingual/palatal residue Oral - Multi-Consistency -- Oral - Pill Lingual pumping;Reduced posterior propulsion;Holding of bolus;Lingual/palatal residue;Decreased bolus cohesion Oral Phase - Comment --  CHL IP PHARYNGEAL PHASE 08/04/2021 Pharyngeal Phase Impaired Pharyngeal- Pudding Teaspoon -- Pharyngeal -- Pharyngeal- Pudding Cup -- Pharyngeal -- Pharyngeal- Honey Teaspoon -- Pharyngeal -- Pharyngeal- Honey Cup -- Pharyngeal -- Pharyngeal- Nectar Teaspoon -- Pharyngeal -- Pharyngeal- Nectar Cup -- Pharyngeal -- Pharyngeal- Nectar Straw -- Pharyngeal -- Pharyngeal- Thin Teaspoon -- Pharyngeal -- Pharyngeal- Thin Cup -- Pharyngeal -- Pharyngeal- Thin Straw Penetration/Aspiration during swallow;Delayed swallow initiation-pyriform sinuses;Pharyngeal residue - valleculae Pharyngeal Material enters airway, remains ABOVE vocal cords then ejected out Pharyngeal- Puree Pharyngeal residue - valleculae Pharyngeal -- Pharyngeal- Mechanical Soft -- Pharyngeal -- Pharyngeal- Regular Pharyngeal residue - valleculae Pharyngeal -- Pharyngeal- Multi-consistency -- Pharyngeal -- Pharyngeal- Pill -- Pharyngeal -- Pharyngeal Comment --  No flowsheet data found. [facility], [place], [place] [facility] Office Number: [phone] Acie Fredrickson 08/04/2021, 3:31 PM              VAS [place] LOWER EXTREMITY VENOUS  (DVT)  Result Date: 08/19/2021  Lower Venous DVT Study Patient Name:  JAMARQUEZ DALTO  Date of Exam:   08/19/2021 Medical Rec #: [id number]           Accession #:    [id number] Date of Birth: 1928/06/11            Patient Gender: M Patient Age:   15 years Exam [facility]  [facility] Procedure:      VAS [place] LOWER EXTREMITY VENOUS (DVT) Referring Phys: Nyoka Lint DOUTOVA --------------------------------------------------------------------------------  Indications: Edema.  Limitations: VERY difficult exam due to patient's inability to cooperate due to mental status (dementia). Patient lying on left side with legs bent - could not reposition. Comparison Study: No previous exams Performing Technologist: Jody Hill RVT, RDMS  Examination Guidelines: A complete evaluation includes B-mode imaging, spectral Doppler, color Doppler, and power Doppler as needed of all accessible portions of each vessel. Bilateral testing is considered an integral part of a complete examination. Limited examinations for reoccurring indications may be performed as noted. The reflux portion of the exam is performed with the patient in reverse Trendelenburg.  +---------+---------------+---------+-----------+----------+-------------------+ RIGHT    CompressibilityPhasicitySpontaneityPropertiesThrombus Aging      +---------+---------------+---------+-----------+----------+-------------------+ CFV      None           No       No                   Chronic             +---------+---------------+---------+-----------+----------+-------------------+ SFJ  None                                         Chronic             +---------+---------------+---------+-----------+----------+-------------------+ FV Prox  None           No       No                   Chronic             +---------+---------------+---------+-----------+----------+-------------------+ FV Mid   None           No       No                   Age  Indeterminate   +---------+---------------+---------+-----------+----------+-------------------+ FV DistalNone           No       No                   Age Indeterminate   +---------+---------------+---------+-----------+----------+-------------------+ PFV      Full           Yes      Yes                                      +---------+---------------+---------+-----------+----------+-------------------+ POP      None           No       No                   Age Indeterminate   +---------+---------------+---------+-----------+----------+-------------------+ PTV      None           No       No                   Age Indeterminate   +---------+---------------+---------+-----------+----------+-------------------+ PERO     None           No       No                   Age Indeterminate   +---------+---------------+---------+-----------+----------+-------------------+ EIV      Partial        Yes      Yes                  Chronic- Distal EIV                                                       (Prox & Mid are                                                           patent)             +---------+---------------+---------+-----------+----------+-------------------+ limited visualization of PTV and PeroV due to patient positioning.  +---------+---------------+---------+-----------+----------+--------------+ LEFT     CompressibilityPhasicitySpontaneityPropertiesThrombus Aging +---------+---------------+---------+-----------+----------+--------------+ CFV  Full           Yes      Yes                                 +---------+---------------+---------+-----------+----------+--------------+ SFJ      Full                                                        +---------+---------------+---------+-----------+----------+--------------+ FV Prox  Full           Yes      Yes                                  +---------+---------------+---------+-----------+----------+--------------+ FV Mid   Full           Yes      Yes                                 +---------+---------------+---------+-----------+----------+--------------+ FV DistalFull           Yes      Yes                                 +---------+---------------+---------+-----------+----------+--------------+ PFV      Full                                                        +---------+---------------+---------+-----------+----------+--------------+ POP      Full           Yes      Yes                                 +---------+---------------+---------+-----------+----------+--------------+ PTV      Full                                                        +---------+---------------+---------+-----------+----------+--------------+ PERO                                                  Not visualized +---------+---------------+---------+-----------+----------+--------------+   Left Technical Findings: Not visualized segments include peroneals.   Summary: BILATERAL: - No evidence of superficial venous thrombosis in the lower extremities, bilaterally. -No evidence of popliteal cyst, bilaterally. RIGHT: - Findings consistent with age indeterminate deep vein thrombosis involving the right femoral vein (mid & distal), right popliteal vein, right posterior tibial veins, and right peroneal veins. - Findings consistent with chronic deep vein thrombosis involving the right external iliac vein (distal), common femoral vein SF junction, and right femoral vein (prox). - Portions  of this examination were limited- see technologist comments above.  LEFT: - There is no evidence of deep vein thrombosis in the lower extremity. However, portions of this examination were limited- see technologist comments above.  *See table(s) above for measurements and observations. Electronically signed by Deitra Mayo MD on 08/19/2021 at 2:53:05  PM.    Final     Labs: BNP (last 3 results) No results for input(s): BNP in the last 8760 hours. Basic Metabolic Panel: Recent Labs  Lab 08/19/21 0200 08/19/21 0712 08/19/21 1256 08/19/21 1647 08/20/21 0245 08/20/21 0753 08/20/21 1333 08/20/21 2125 08/21/21 0156 08/21/21 0753 08/21/21 1411 08/21/21 2032 08/22/21 0200  NA 160* 160* 165*   < > 162*   < > 165* 159* 156* 153* 151* 150* 150*  K 2.9* <2.0* 2.0*   < > 2.8*  --  2.5* 3.9 2.9*  --   --   --  2.8*  CL 130* 129* >130*   < > >130*  --  >130* 127* 126*  --   --   --  120*  CO2 '23 23 24   '$ < > 22  --  '25 23 22  '$ --   --   --  21*  GLUCOSE 148* 136* 145*   < > 148*  --  140* 166* 154*  --   --   --  131*  BUN 46* 37* 41*   < > 37*  --  33* 31* 27*  --   --   --  20  CREATININE 1.86* 1.83* 1.75*   < > 1.60*  --  1.43* 1.46* 1.39*  --   --   --  1.27*  CALCIUM 8.2* 8.5* 8.5*   < > 8.2*  --  8.4* 8.8* 8.1*  --   --   --  8.1*  MG 2.4 2.2 2.4  --  2.1  --   --   --   --   --   --   --   --   PHOS 1.9* 2.2*  --   --  3.2  --   --   --   --   --   --   --   --    < > = values in this interval not displayed.   Liver Function Tests: Recent Labs  Lab 08/18/21 1822 08/19/21 0712 08/20/21 2125  AST 16 11* 20  ALT '13 12 8  '$ ALKPHOS 78 71 79  BILITOT 0.6 0.6 0.5  PROT 7.9 7.0 7.3  ALBUMIN 3.2* 2.9* 2.9*   No results for input(s): LIPASE, AMYLASE in the last 168 hours. No results for input(s): AMMONIA in the last 168 hours. CBC: Recent Labs  Lab 08/18/21 1822 08/19/21 0712 08/20/21 0245 08/21/21 0156 08/22/21 0200  WBC 11.2* 10.4 10.0 8.7 8.8  NEUTROABS 9.7* 9.0*  --   --   --   HGB 13.8 12.3* 12.2* 11.8* 11.3*  HCT 45.2 41.4 39.4 38.8* 37.7*  MCV 92.8 95.2 91.8 93.3 94.0  PLT 141* 116* 119* 128* 128*   Cardiac Enzymes: Recent Labs  Lab 08/19/21 0200  CKTOTAL 91   BNP: Invalid input(s): POCBNP CBG: Recent Labs  Lab 08/21/21 2152  GLUCAP 145*   D-Dimer No results for input(s): DDIMER in the last 72  hours. Hgb A1c No results for input(s): HGBA1C in the last 72 hours. Lipid Profile No results for input(s): CHOL, HDL, LDLCALC, TRIG, CHOLHDL, LDLDIRECT in the last 72 hours. Thyroid function studies Recent  Labs    08/19/21 1256  TSH 1.823   Anemia work up No results for input(s): VITAMINB12, FOLATE, FERRITIN, TIBC, IRON, RETICCTPCT in the last 72 hours. Urinalysis    Component Value Date/Time   COLORURINE AMBER (A) 08/18/2021 2134   APPEARANCEUR TURBID (A) 08/18/2021 2134   LABSPEC 1.017 08/18/2021 2134   PHURINE 6.0 08/18/2021 2134   GLUCOSEU NEGATIVE 08/18/2021 2134   HGBUR MODERATE (A) 08/18/2021 2134   BILIRUBINUR NEGATIVE 08/18/2021 2134   KETONESUR 5 (A) 08/18/2021 2134   PROTEINUR 100 (A) 08/18/2021 2134   UROBILINOGEN 0.2 05/29/2012 1354   NITRITE NEGATIVE 08/18/2021 2134   LEUKOCYTESUR LARGE (A) 08/18/2021 2134   Sepsis Labs Invalid input(s): PROCALCITONIN,  WBC,  LACTICIDVEN Microbiology Recent Results (from the past 240 hour(s))  Resp Panel by RT-PCR (Flu A&B, Covid) Nasopharyngeal Swab     Status: None   Collection Time: 08/18/21  6:20 PM   Specimen: Nasopharyngeal Swab; Nasopharyngeal(NP) swabs in vial transport medium  Result Value Ref Range Status   SARS Coronavirus 2 by RT PCR NEGATIVE NEGATIVE Final    Comment: (NOTE) SARS-CoV-2 target nucleic acids are NOT DETECTED.  The SARS-CoV-2 RNA is generally detectable in upper respiratory specimens during the acute phase of infection. The lowest concentration of SARS-CoV-2 viral copies this assay can detect is 138 copies/mL. A negative result does not preclude SARS-Cov-2 infection and should not be used as the sole basis for treatment or other patient management decisions. A negative result may occur with  improper specimen collection/handling, submission of specimen other than nasopharyngeal swab, presence of viral mutation(s) within the areas targeted by this assay, and inadequate number of  viral copies(<138 copies/mL). A negative result must be combined with clinical observations, patient history, and epidemiological information. The expected result is Negative.  Fact Sheet for Patients:  EntrepreneurPulse.com.au  Fact Sheet for Healthcare Providers:  IncredibleEmployment.be  This test is no t yet approved or cleared by the [place] FDA and  has been authorized for detection and/or diagnosis of SARS-CoV-2 by FDA under an Emergency Use Authorization (EUA). This EUA will remain  in effect (meaning this test can be used) for the duration of the COVID-19 declaration under Section 564(b)(1) of the Act, 21 U.S.C.section 360bbb-3(b)(1), unless the authorization is terminated  or revoked sooner.       Influenza A by PCR NEGATIVE NEGATIVE Final   Influenza B by PCR NEGATIVE NEGATIVE Final    Comment: (NOTE) The Xpert Xpress SARS-CoV-2/FLU/RSV plus assay is intended as an aid in the diagnosis of influenza from Nasopharyngeal swab specimens and should not be used as a sole basis for treatment. Nasal washings and aspirates are unacceptable for Xpert Xpress SARS-CoV-2/FLU/RSV testing.  Fact Sheet for Patients: EntrepreneurPulse.com.au  Fact Sheet for Healthcare Providers: IncredibleEmployment.be  This test is not yet approved or cleared by the [place] FDA and has been authorized for detection and/or diagnosis of SARS-CoV-2 by FDA under an Emergency Use Authorization (EUA). This EUA will remain in effect (meaning this test can be used) for the duration of the COVID-19 declaration under Section 564(b)(1) of the Act, 21 U.S.C. section 360bbb-3(b)(1), unless the authorization is terminated or revoked.  Performed at [facility], [place] [address], [place], [place] [address]   Urine Culture     Status: Abnormal   Collection Time: 08/19/21  2:08 AM   Specimen: Urine, Clean  Catch  Result Value Ref Range Status   Specimen Description   Final    URINE,  CLEAN CATCH Performed at [facility], [place] [address], [place], [place] [address]    Special Requests   Final    NONE Performed at [facility], [place] [address] [place], [place] [address]    Culture (A)  Final    >=100,000 COLONIES/mL AEROCOCCUS SPECIES Standardized susceptibility testing for this organism is not available. Performed at [facility], [place] [address], [place], [place] [address]    Report Status 08/20/2021 FINAL  Final     Time coordinating discharge: 35 minutes  SIGNED: Antonieta Pert, MD  [facility] 08/22/2021, 11:22 AM  If 7PM-7AM, please contact night-coverage [email or web address]

## 2021-08-22 NOTE — Progress Notes (Addendum)
Manufacturing engineer [facility]) Hospital Liaison note.    Addendum: 11:00am:  Consents are complete.  Care team made aware.  TOC verified pt ready for transport.  [facility] called for transport.  Chart reviewed and eligibility confirmed for [facility]. Spoke with family to confirm interest and explain services. Family agreeable to transfer today. TOC aware.    [facility] will notify TOC when registration paperwork has been completed to arrange transport.   RN please call report to [phone].  Please be sure a signed DNR form transports with the patient.  Thank you for the opportunity to participate in this patient's care.  Domenic Moras, BSN, RN [facility] Liaison (listed on [place] under [facility])    [phone] (24h on call)

## 2021-08-27 ENCOUNTER — Other Ambulatory Visit: Payer: Self-pay | Admitting: Hospice

## 2021-09-03 ENCOUNTER — Telehealth: Payer: Self-pay | Admitting: Family Medicine

## 2021-09-03 NOTE — Telephone Encounter (Signed)
Received notation from [facility] that patient had passed on 2021/09/23.  Called patient's daughter Casey Fisher and expressed condolences.  She is doing okay, working on details and things to do in preparation for his services.  She is on bereavement leave right now but that will end on this upcoming Monday, may need additional time.  Recommended she discuss with her human resources at her company if there are additional options and if that includes FMLA or other paperwork I can assist with, I am happy to do so.  Let her know that I was fortunate to be able to take care of her father and that he will be missed.  No acute needs identified at this time.

## 2021-09-24 ENCOUNTER — Other Ambulatory Visit: Payer: Self-pay | Admitting: *Deleted

## 2021-09-24 MED ORDER — DONEPEZIL HCL 5 MG PO TABS: 5.0000 mg | ORAL_TABLET | Freq: Every day | ORAL | 3 refills | Status: DC

## 2021-09-27 DEATH — deceased

## 2021-10-02 ENCOUNTER — Ambulatory Visit: Payer: Medicare Other | Admitting: *Deleted

## 2021-12-30 ENCOUNTER — Other Ambulatory Visit: Payer: Self-pay

## 2021-12-30 NOTE — Patient Outreach (Signed)
[facility]) Care Management  12/30/2021  ATARI NOVICK 01/09/1928 [id number]   CMA attempt to complete [facility] Care Management patient survey. CMA was unable to leave a confidential voicemail due to patients voicemail not set up.  CMA will call back within 1 week.  Ina [employer] [facility] Management Assistant [phone]

## 2022-06-08 IMAGING — CT CT ABD-PELV W/O CM
2 of 4 series · 15 of 46 positions shown, 17 images · non-contrast
Comparison: 10/29/2016

CLINICAL DATA: Bowel obstruction, anorexia, abdominal distension

EXAM:
CT ABDOMEN AND PELVIS WITHOUT CONTRAST
TECHNIQUE: Multidetector CT imaging of the abdomen and pelvis was performed
following the standard protocol without IV contrast.

[Series 2: axial st · axial · 0.71mm/px · z∈[+1154,+1580]mm · 12 of 96 slices shown, 14 images]
[im 6/96  soft-tissue]
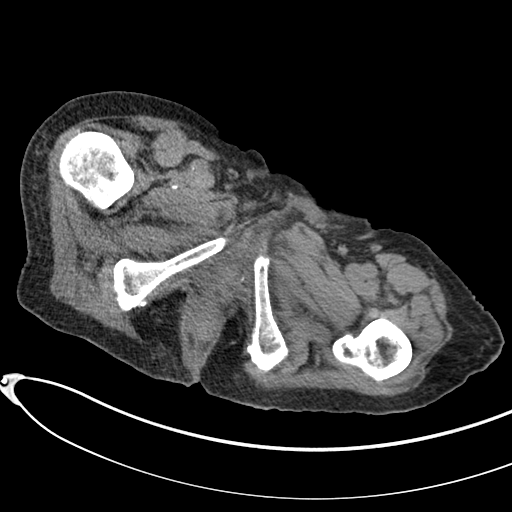
[im 6/96  bone]
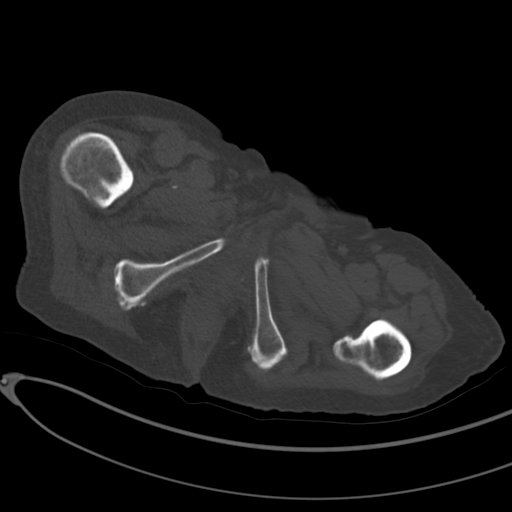
[im 16/96  soft-tissue]
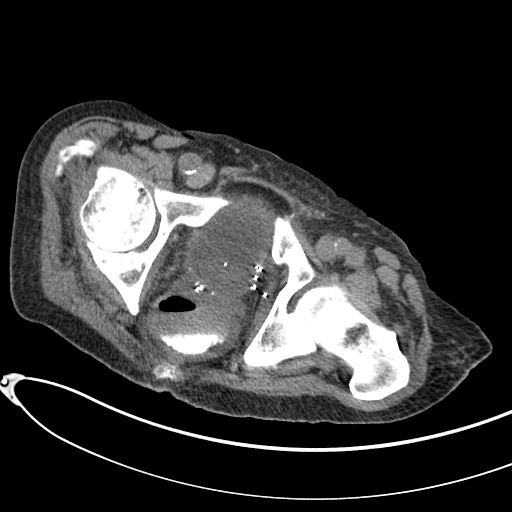
[im 21/96  soft-tissue]
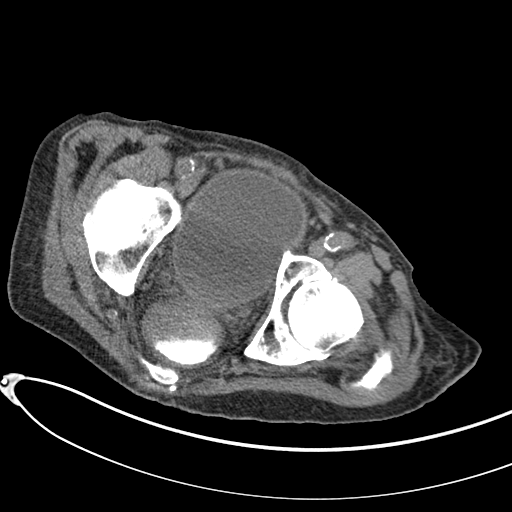
[im 31/96  soft-tissue]
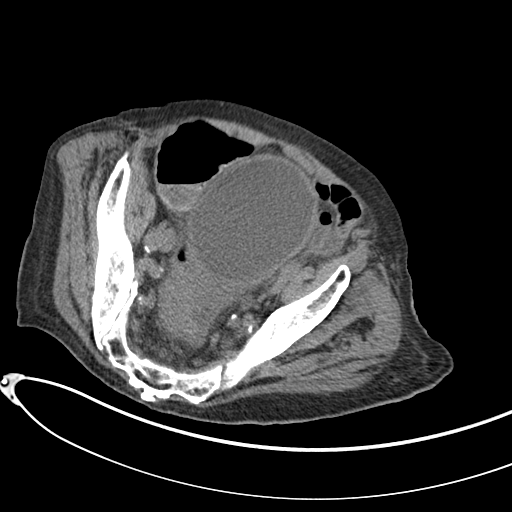
[im 36/96  soft-tissue]
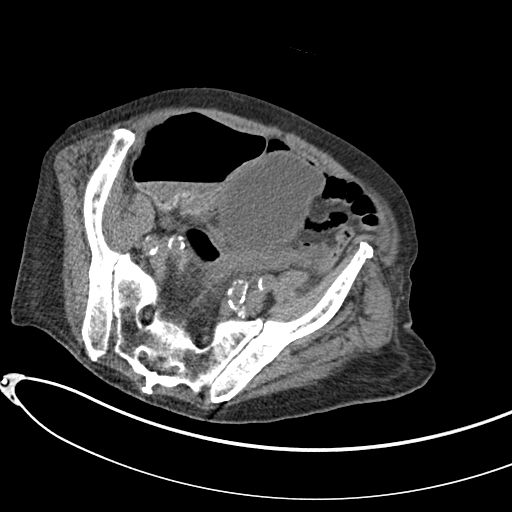
[im 46/96  soft-tissue]
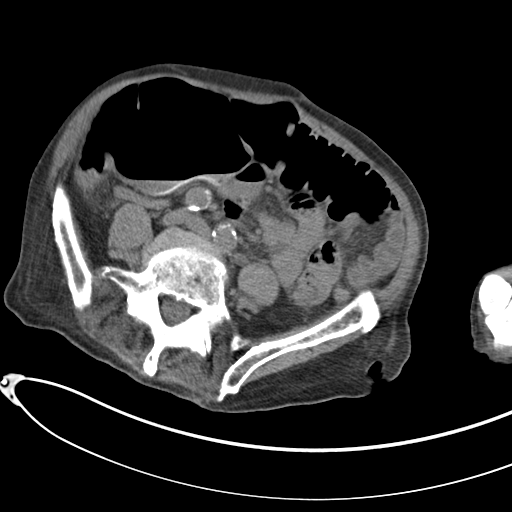
[im 51/96  soft-tissue]
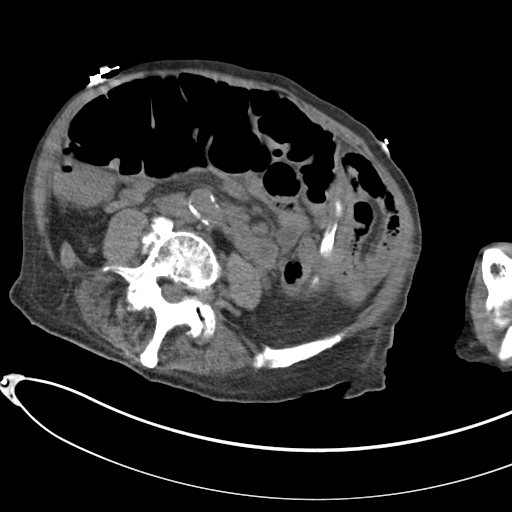
[im 61/96  soft-tissue]
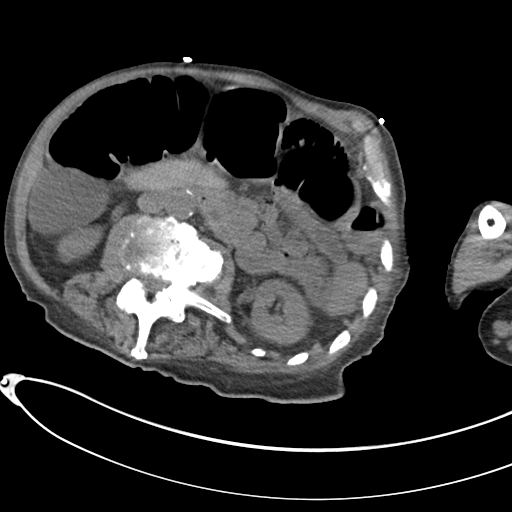
[im 66/96  soft-tissue]
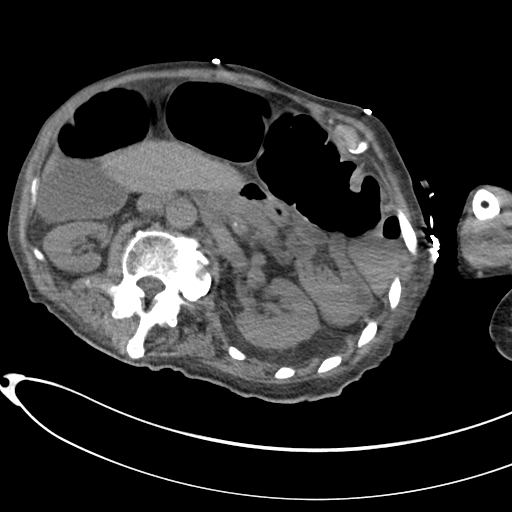
[im 66/96  bone]
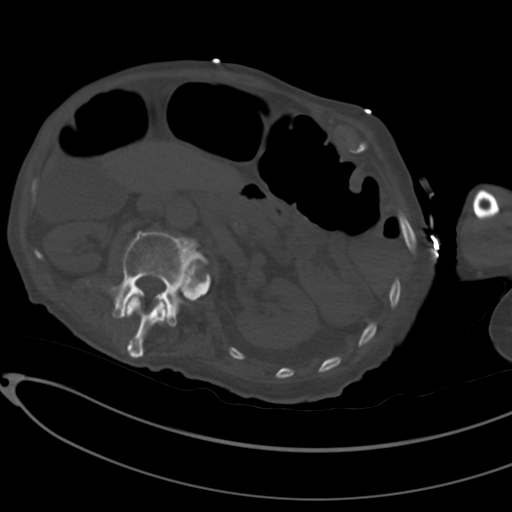
[im 76/96  soft-tissue]
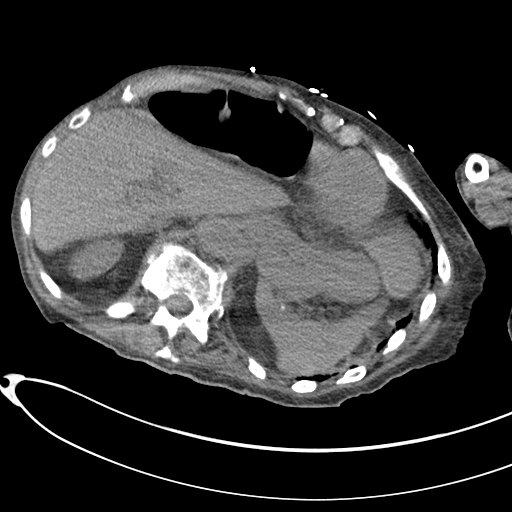
[im 81/96  soft-tissue]
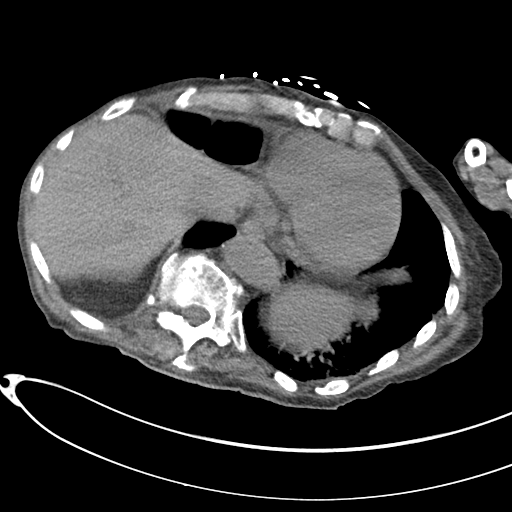
[im 91/96  soft-tissue]
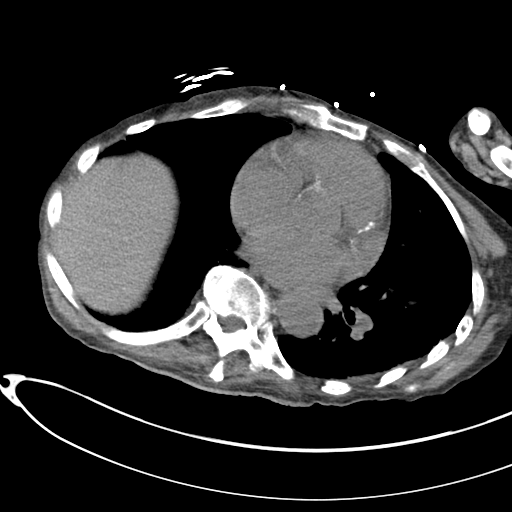

[Series 4: coronal st · coronal · 0.72mm/px · 3 of 151 slices shown]
[im 51/151  soft-tissue]
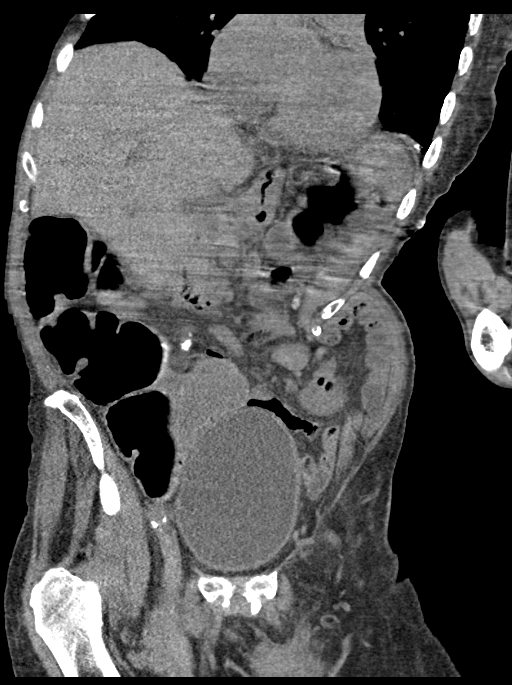
[im 67/151  soft-tissue]
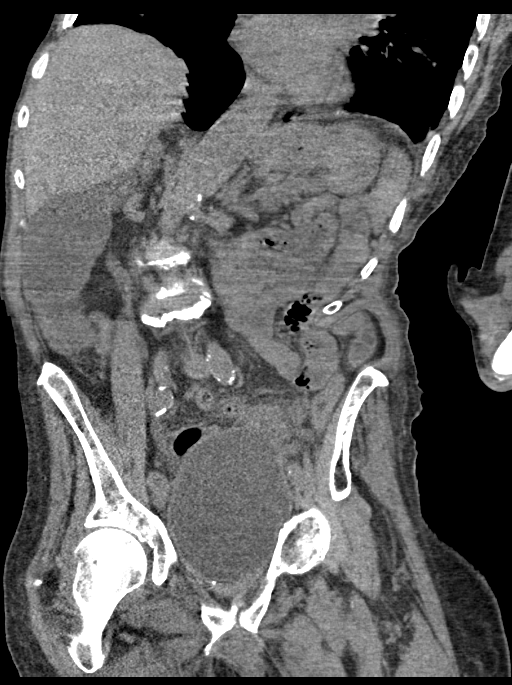
[im 84/151  soft-tissue]
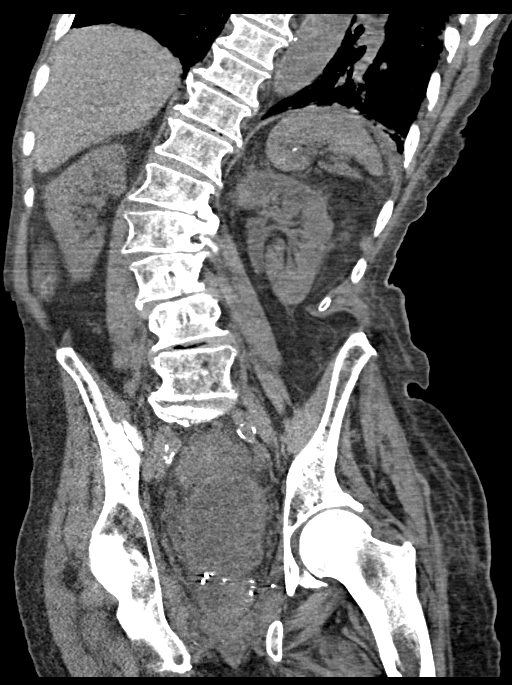

[15 of 46 positions shown; findings below may reference images not displayed]

FINDINGS: Lower chest: Visualized lung bases are clear bilaterally. Mild
coronary artery calcification. Global cardiac size within normal
limits.

Hepatobiliary: Cholelithiasis without pericholecystic inflammatory
change. Stable subserosal cyst noted within the left hepatic lobe.
Liver is otherwise unremarkable. No intra or extrahepatic biliary
ductal dilation.

Pancreas: Unremarkable

Spleen: Unremarkable

Adrenals/Urinary Tract: The adrenal glands are unremarkable. The
kidneys are mildly atrophic, but are otherwise unremarkable. The
bladder is distended but is otherwise unremarkable.

Stomach/Bowel: There is mild gaseous distension of the a colon most
in keeping with a mild colonic ileus. No evidence of obstruction.
Fluid within the rectal vault may present clinically as diarrhea.
High density material within the rectal vault may relate to prior
contrast study or enema. The stomach, small bowel, and large bowel
are otherwise unremarkable. Appendix normal. No free intraperitoneal
gas or fluid.

Vascular/Lymphatic: Moderate aortoiliac atherosclerotic
calcification. No aortic aneurysm. No pathologic adenopathy within
the abdomen and pelvis.

Reproductive: Brachytherapy seeds seen within the prostate gland.

Other: Tiny fat containing umbilical hernia.

Musculoskeletal: Degenerative changes are seen within the lumbar
spine. Moderate lumbar dextroscoliosis noted. No acute bone
abnormality.
IMPRESSION: Mild gaseous distension of the colon suggesting a mild colonic
ileus. No evidence of obstruction.

Fluid within the rectal vault possibly presenting clinically as
diarrhea.

Cholelithiasis.

Mild bladder distension possibly related to voluntary retention or
bladder outlet obstruction.

Aortic Atherosclerosis ([id number]).

## 2022-06-21 ENCOUNTER — Other Ambulatory Visit: Payer: Self-pay | Admitting: Family Medicine

## 2022-06-21 DIAGNOSIS — E785 Hyperlipidemia, unspecified: Secondary | ICD-10-CM

## 2023-04-09 ENCOUNTER — Telehealth: Payer: Self-pay | Admitting: Family Medicine

## 2023-04-09 NOTE — Telephone Encounter (Signed)
Disregard. Opened in error
